# Patient Record
Sex: Female | Born: 1960
Health system: Southern US, Community
[De-identification: ages and names within clinical notes are randomized; demographics above are authoritative.]

## PROBLEM LIST (undated history)

## (undated) DIAGNOSIS — I2510 Atherosclerotic heart disease of native coronary artery without angina pectoris: Secondary | ICD-10-CM

## (undated) DIAGNOSIS — M549 Dorsalgia, unspecified: Secondary | ICD-10-CM

## (undated) DIAGNOSIS — I2582 Chronic total occlusion of coronary artery: Secondary | ICD-10-CM

## (undated) DIAGNOSIS — E785 Hyperlipidemia, unspecified: Secondary | ICD-10-CM

## (undated) DIAGNOSIS — F329 Major depressive disorder, single episode, unspecified: Secondary | ICD-10-CM

## (undated) DIAGNOSIS — I10 Essential (primary) hypertension: Secondary | ICD-10-CM

## (undated) DIAGNOSIS — I219 Acute myocardial infarction, unspecified: Secondary | ICD-10-CM

## (undated) DIAGNOSIS — Z8719 Personal history of other diseases of the digestive system: Secondary | ICD-10-CM

## (undated) DIAGNOSIS — M797 Fibromyalgia: Secondary | ICD-10-CM

## (undated) DIAGNOSIS — F32A Depression, unspecified: Secondary | ICD-10-CM

## (undated) DIAGNOSIS — M1990 Unspecified osteoarthritis, unspecified site: Secondary | ICD-10-CM

## (undated) DIAGNOSIS — E119 Type 2 diabetes mellitus without complications: Secondary | ICD-10-CM

## (undated) DIAGNOSIS — N200 Calculus of kidney: Secondary | ICD-10-CM

## (undated) DIAGNOSIS — G8929 Other chronic pain: Secondary | ICD-10-CM

## (undated) DIAGNOSIS — E039 Hypothyroidism, unspecified: Secondary | ICD-10-CM

## (undated) DIAGNOSIS — Z8711 Personal history of peptic ulcer disease: Secondary | ICD-10-CM

## (undated) DIAGNOSIS — Z9861 Coronary angioplasty status: Secondary | ICD-10-CM

## (undated) DIAGNOSIS — Z87891 Personal history of nicotine dependence: Secondary | ICD-10-CM

## (undated) DIAGNOSIS — G4733 Obstructive sleep apnea (adult) (pediatric): Secondary | ICD-10-CM

## (undated) DIAGNOSIS — K625 Hemorrhage of anus and rectum: Secondary | ICD-10-CM

## (undated) DIAGNOSIS — Z1211 Encounter for screening for malignant neoplasm of colon: Secondary | ICD-10-CM

## (undated) HISTORY — PX: TONSILLECTOMY: SUR1361

## (undated) HISTORY — PX: TOTAL ABDOMINAL HYSTERECTOMY: SHX209

## (undated) HISTORY — PX: INNER EAR SURGERY: SHX679

## (undated) HISTORY — PX: TRANSTHORACIC ECHOCARDIOGRAM: SHX275

---

## 1998-07-01 HISTORY — PX: TUBAL LIGATION: SHX77

## 2013-08-12 DIAGNOSIS — R0789 Other chest pain: Secondary | ICD-10-CM | POA: Diagnosis not present

## 2013-08-12 DIAGNOSIS — I10 Essential (primary) hypertension: Secondary | ICD-10-CM | POA: Diagnosis not present

## 2013-08-12 DIAGNOSIS — F17200 Nicotine dependence, unspecified, uncomplicated: Secondary | ICD-10-CM | POA: Diagnosis not present

## 2013-08-12 DIAGNOSIS — E039 Hypothyroidism, unspecified: Secondary | ICD-10-CM | POA: Diagnosis not present

## 2013-08-12 DIAGNOSIS — R0602 Shortness of breath: Secondary | ICD-10-CM | POA: Diagnosis not present

## 2013-08-12 DIAGNOSIS — R071 Chest pain on breathing: Secondary | ICD-10-CM | POA: Diagnosis not present

## 2013-08-19 DIAGNOSIS — F3289 Other specified depressive episodes: Secondary | ICD-10-CM | POA: Diagnosis not present

## 2013-08-19 DIAGNOSIS — B029 Zoster without complications: Secondary | ICD-10-CM | POA: Diagnosis not present

## 2013-08-19 DIAGNOSIS — E876 Hypokalemia: Secondary | ICD-10-CM | POA: Diagnosis not present

## 2013-08-19 DIAGNOSIS — F329 Major depressive disorder, single episode, unspecified: Secondary | ICD-10-CM | POA: Diagnosis not present

## 2013-08-19 DIAGNOSIS — Z8739 Personal history of other diseases of the musculoskeletal system and connective tissue: Secondary | ICD-10-CM | POA: Diagnosis not present

## 2013-08-19 DIAGNOSIS — I10 Essential (primary) hypertension: Secondary | ICD-10-CM | POA: Diagnosis not present

## 2013-08-19 DIAGNOSIS — E039 Hypothyroidism, unspecified: Secondary | ICD-10-CM | POA: Diagnosis not present

## 2013-10-13 DIAGNOSIS — D239 Other benign neoplasm of skin, unspecified: Secondary | ICD-10-CM | POA: Diagnosis not present

## 2013-10-13 DIAGNOSIS — L280 Lichen simplex chronicus: Secondary | ICD-10-CM | POA: Diagnosis not present

## 2013-10-13 DIAGNOSIS — L821 Other seborrheic keratosis: Secondary | ICD-10-CM | POA: Diagnosis not present

## 2013-10-13 DIAGNOSIS — D235 Other benign neoplasm of skin of trunk: Secondary | ICD-10-CM | POA: Diagnosis not present

## 2013-10-13 DIAGNOSIS — D1801 Hemangioma of skin and subcutaneous tissue: Secondary | ICD-10-CM | POA: Diagnosis not present

## 2013-10-13 DIAGNOSIS — L820 Inflamed seborrheic keratosis: Secondary | ICD-10-CM | POA: Diagnosis not present

## 2013-10-13 DIAGNOSIS — D485 Neoplasm of uncertain behavior of skin: Secondary | ICD-10-CM | POA: Diagnosis not present

## 2013-12-01 DIAGNOSIS — F17200 Nicotine dependence, unspecified, uncomplicated: Secondary | ICD-10-CM | POA: Diagnosis not present

## 2013-12-01 DIAGNOSIS — I10 Essential (primary) hypertension: Secondary | ICD-10-CM | POA: Diagnosis not present

## 2013-12-01 DIAGNOSIS — E039 Hypothyroidism, unspecified: Secondary | ICD-10-CM | POA: Diagnosis not present

## 2013-12-01 DIAGNOSIS — M7989 Other specified soft tissue disorders: Secondary | ICD-10-CM | POA: Diagnosis not present

## 2013-12-01 DIAGNOSIS — E785 Hyperlipidemia, unspecified: Secondary | ICD-10-CM | POA: Diagnosis not present

## 2013-12-07 DIAGNOSIS — F329 Major depressive disorder, single episode, unspecified: Secondary | ICD-10-CM | POA: Diagnosis not present

## 2013-12-07 DIAGNOSIS — F3289 Other specified depressive episodes: Secondary | ICD-10-CM | POA: Diagnosis not present

## 2013-12-07 DIAGNOSIS — E039 Hypothyroidism, unspecified: Secondary | ICD-10-CM | POA: Diagnosis not present

## 2013-12-07 DIAGNOSIS — I10 Essential (primary) hypertension: Secondary | ICD-10-CM | POA: Diagnosis not present

## 2013-12-07 DIAGNOSIS — M79609 Pain in unspecified limb: Secondary | ICD-10-CM | POA: Diagnosis not present

## 2013-12-24 DIAGNOSIS — E119 Type 2 diabetes mellitus without complications: Secondary | ICD-10-CM | POA: Diagnosis not present

## 2013-12-24 DIAGNOSIS — R42 Dizziness and giddiness: Secondary | ICD-10-CM | POA: Diagnosis not present

## 2013-12-24 DIAGNOSIS — I10 Essential (primary) hypertension: Secondary | ICD-10-CM | POA: Diagnosis not present

## 2013-12-24 DIAGNOSIS — R51 Headache: Secondary | ICD-10-CM | POA: Diagnosis not present

## 2013-12-24 DIAGNOSIS — H5500 Unspecified nystagmus: Secondary | ICD-10-CM | POA: Diagnosis not present

## 2014-03-17 DIAGNOSIS — F17200 Nicotine dependence, unspecified, uncomplicated: Secondary | ICD-10-CM | POA: Diagnosis not present

## 2014-03-17 DIAGNOSIS — R079 Chest pain, unspecified: Secondary | ICD-10-CM | POA: Diagnosis not present

## 2014-03-17 DIAGNOSIS — R1013 Epigastric pain: Secondary | ICD-10-CM | POA: Diagnosis not present

## 2014-03-17 DIAGNOSIS — Z87442 Personal history of urinary calculi: Secondary | ICD-10-CM | POA: Diagnosis not present

## 2014-03-17 DIAGNOSIS — R1012 Left upper quadrant pain: Secondary | ICD-10-CM | POA: Diagnosis not present

## 2014-03-17 DIAGNOSIS — I10 Essential (primary) hypertension: Secondary | ICD-10-CM | POA: Diagnosis not present

## 2014-03-17 DIAGNOSIS — R0602 Shortness of breath: Secondary | ICD-10-CM | POA: Diagnosis not present

## 2014-03-21 DIAGNOSIS — I10 Essential (primary) hypertension: Secondary | ICD-10-CM | POA: Diagnosis not present

## 2014-03-21 DIAGNOSIS — M5430 Sciatica, unspecified side: Secondary | ICD-10-CM | POA: Diagnosis not present

## 2014-03-21 DIAGNOSIS — F17200 Nicotine dependence, unspecified, uncomplicated: Secondary | ICD-10-CM | POA: Diagnosis not present

## 2014-03-21 DIAGNOSIS — R109 Unspecified abdominal pain: Secondary | ICD-10-CM | POA: Diagnosis not present

## 2014-03-21 DIAGNOSIS — R7309 Other abnormal glucose: Secondary | ICD-10-CM | POA: Diagnosis not present

## 2014-03-23 DIAGNOSIS — F17200 Nicotine dependence, unspecified, uncomplicated: Secondary | ICD-10-CM | POA: Diagnosis not present

## 2014-03-23 DIAGNOSIS — M5430 Sciatica, unspecified side: Secondary | ICD-10-CM | POA: Diagnosis not present

## 2014-03-23 DIAGNOSIS — F329 Major depressive disorder, single episode, unspecified: Secondary | ICD-10-CM | POA: Diagnosis not present

## 2014-03-23 DIAGNOSIS — Z13220 Encounter for screening for lipoid disorders: Secondary | ICD-10-CM | POA: Diagnosis not present

## 2014-03-23 DIAGNOSIS — R109 Unspecified abdominal pain: Secondary | ICD-10-CM | POA: Diagnosis not present

## 2014-03-23 DIAGNOSIS — F3289 Other specified depressive episodes: Secondary | ICD-10-CM | POA: Diagnosis not present

## 2014-03-23 DIAGNOSIS — E119 Type 2 diabetes mellitus without complications: Secondary | ICD-10-CM | POA: Diagnosis not present

## 2014-03-23 DIAGNOSIS — I10 Essential (primary) hypertension: Secondary | ICD-10-CM | POA: Diagnosis not present

## 2014-03-23 DIAGNOSIS — R1012 Left upper quadrant pain: Secondary | ICD-10-CM | POA: Diagnosis not present

## 2014-03-23 DIAGNOSIS — IMO0001 Reserved for inherently not codable concepts without codable children: Secondary | ICD-10-CM | POA: Diagnosis not present

## 2014-03-23 DIAGNOSIS — E039 Hypothyroidism, unspecified: Secondary | ICD-10-CM | POA: Diagnosis not present

## 2014-03-23 DIAGNOSIS — Z87442 Personal history of urinary calculi: Secondary | ICD-10-CM | POA: Diagnosis not present

## 2014-04-15 DIAGNOSIS — I10 Essential (primary) hypertension: Secondary | ICD-10-CM | POA: Diagnosis not present

## 2014-05-09 DIAGNOSIS — Z79899 Other long term (current) drug therapy: Secondary | ICD-10-CM | POA: Diagnosis not present

## 2014-05-09 DIAGNOSIS — F341 Dysthymic disorder: Secondary | ICD-10-CM | POA: Diagnosis not present

## 2014-05-09 DIAGNOSIS — E1142 Type 2 diabetes mellitus with diabetic polyneuropathy: Secondary | ICD-10-CM | POA: Diagnosis not present

## 2014-05-09 DIAGNOSIS — R5382 Chronic fatigue, unspecified: Secondary | ICD-10-CM | POA: Diagnosis not present

## 2014-05-09 DIAGNOSIS — J441 Chronic obstructive pulmonary disease with (acute) exacerbation: Secondary | ICD-10-CM | POA: Diagnosis not present

## 2014-11-09 DIAGNOSIS — H60502 Unspecified acute noninfective otitis externa, left ear: Secondary | ICD-10-CM | POA: Diagnosis not present

## 2014-11-09 DIAGNOSIS — Z880 Allergy status to penicillin: Secondary | ICD-10-CM | POA: Diagnosis not present

## 2014-11-09 DIAGNOSIS — Z888 Allergy status to other drugs, medicaments and biological substances status: Secondary | ICD-10-CM | POA: Diagnosis not present

## 2014-11-09 DIAGNOSIS — F17200 Nicotine dependence, unspecified, uncomplicated: Secondary | ICD-10-CM | POA: Diagnosis not present

## 2014-11-15 ENCOUNTER — Emergency Department (HOSPITAL_COMMUNITY): Payer: Medicare Other

## 2014-11-15 ENCOUNTER — Inpatient Hospital Stay (HOSPITAL_COMMUNITY)
Admission: EM | Admit: 2014-11-15 | Discharge: 2014-11-18 | DRG: 247 | Disposition: A | Discharge status: Home / Self Care | Payer: Medicare Other | Attending: Internal Medicine | Admitting: Internal Medicine

## 2014-11-15 ENCOUNTER — Encounter (HOSPITAL_COMMUNITY): Payer: Self-pay

## 2014-11-15 ENCOUNTER — Other Ambulatory Visit (HOSPITAL_COMMUNITY): Payer: Self-pay

## 2014-11-15 DIAGNOSIS — I214 Non-ST elevation (NSTEMI) myocardial infarction: Secondary | ICD-10-CM

## 2014-11-15 DIAGNOSIS — M25461 Effusion, right knee: Secondary | ICD-10-CM | POA: Diagnosis present

## 2014-11-15 DIAGNOSIS — I10 Essential (primary) hypertension: Secondary | ICD-10-CM | POA: Diagnosis not present

## 2014-11-15 DIAGNOSIS — E039 Hypothyroidism, unspecified: Secondary | ICD-10-CM | POA: Diagnosis present

## 2014-11-15 DIAGNOSIS — E871 Hypo-osmolality and hyponatremia: Secondary | ICD-10-CM | POA: Diagnosis not present

## 2014-11-15 DIAGNOSIS — E781 Pure hyperglyceridemia: Secondary | ICD-10-CM | POA: Diagnosis present

## 2014-11-15 DIAGNOSIS — F12929 Cannabis use, unspecified with intoxication, unspecified: Secondary | ICD-10-CM | POA: Diagnosis present

## 2014-11-15 DIAGNOSIS — F17210 Nicotine dependence, cigarettes, uncomplicated: Secondary | ICD-10-CM | POA: Diagnosis present

## 2014-11-15 DIAGNOSIS — E1159 Type 2 diabetes mellitus with other circulatory complications: Secondary | ICD-10-CM | POA: Diagnosis not present

## 2014-11-15 DIAGNOSIS — R748 Abnormal levels of other serum enzymes: Secondary | ICD-10-CM | POA: Diagnosis not present

## 2014-11-15 DIAGNOSIS — Z888 Allergy status to other drugs, medicaments and biological substances status: Secondary | ICD-10-CM | POA: Diagnosis not present

## 2014-11-15 DIAGNOSIS — I25119 Atherosclerotic heart disease of native coronary artery with unspecified angina pectoris: Secondary | ICD-10-CM | POA: Diagnosis present

## 2014-11-15 DIAGNOSIS — E860 Dehydration: Secondary | ICD-10-CM | POA: Diagnosis present

## 2014-11-15 DIAGNOSIS — H9202 Otalgia, left ear: Secondary | ICD-10-CM

## 2014-11-15 DIAGNOSIS — R7989 Other specified abnormal findings of blood chemistry: Secondary | ICD-10-CM | POA: Diagnosis not present

## 2014-11-15 DIAGNOSIS — G4730 Sleep apnea, unspecified: Secondary | ICD-10-CM | POA: Diagnosis present

## 2014-11-15 DIAGNOSIS — G43909 Migraine, unspecified, not intractable, without status migrainosus: Secondary | ICD-10-CM | POA: Diagnosis present

## 2014-11-15 DIAGNOSIS — I2582 Chronic total occlusion of coronary artery: Secondary | ICD-10-CM | POA: Diagnosis present

## 2014-11-15 DIAGNOSIS — I2510 Atherosclerotic heart disease of native coronary artery without angina pectoris: Secondary | ICD-10-CM

## 2014-11-15 DIAGNOSIS — Z87891 Personal history of nicotine dependence: Secondary | ICD-10-CM

## 2014-11-15 DIAGNOSIS — I219 Acute myocardial infarction, unspecified: Secondary | ICD-10-CM | POA: Diagnosis not present

## 2014-11-15 DIAGNOSIS — Z720 Tobacco use: Secondary | ICD-10-CM | POA: Diagnosis not present

## 2014-11-15 DIAGNOSIS — R42 Dizziness and giddiness: Secondary | ICD-10-CM | POA: Diagnosis not present

## 2014-11-15 DIAGNOSIS — R001 Bradycardia, unspecified: Secondary | ICD-10-CM | POA: Diagnosis present

## 2014-11-15 DIAGNOSIS — I209 Angina pectoris, unspecified: Secondary | ICD-10-CM | POA: Diagnosis not present

## 2014-11-15 DIAGNOSIS — R079 Chest pain, unspecified: Secondary | ICD-10-CM | POA: Diagnosis not present

## 2014-11-15 DIAGNOSIS — R739 Hyperglycemia, unspecified: Secondary | ICD-10-CM | POA: Diagnosis not present

## 2014-11-15 DIAGNOSIS — R072 Precordial pain: Secondary | ICD-10-CM

## 2014-11-15 DIAGNOSIS — E876 Hypokalemia: Secondary | ICD-10-CM | POA: Diagnosis not present

## 2014-11-15 DIAGNOSIS — R778 Other specified abnormalities of plasma proteins: Secondary | ICD-10-CM

## 2014-11-15 DIAGNOSIS — E119 Type 2 diabetes mellitus without complications: Secondary | ICD-10-CM | POA: Diagnosis not present

## 2014-11-15 DIAGNOSIS — E1165 Type 2 diabetes mellitus with hyperglycemia: Secondary | ICD-10-CM | POA: Diagnosis present

## 2014-11-15 DIAGNOSIS — Z880 Allergy status to penicillin: Secondary | ICD-10-CM | POA: Diagnosis not present

## 2014-11-15 DIAGNOSIS — E1169 Type 2 diabetes mellitus with other specified complication: Secondary | ICD-10-CM | POA: Diagnosis not present

## 2014-11-15 DIAGNOSIS — Z9861 Coronary angioplasty status: Secondary | ICD-10-CM

## 2014-11-15 DIAGNOSIS — W19XXXA Unspecified fall, initial encounter: Secondary | ICD-10-CM | POA: Diagnosis present

## 2014-11-15 DIAGNOSIS — E785 Hyperlipidemia, unspecified: Secondary | ICD-10-CM

## 2014-11-15 DIAGNOSIS — E1151 Type 2 diabetes mellitus with diabetic peripheral angiopathy without gangrene: Secondary | ICD-10-CM | POA: Diagnosis not present

## 2014-11-15 DIAGNOSIS — R0789 Other chest pain: Secondary | ICD-10-CM | POA: Diagnosis not present

## 2014-11-15 DIAGNOSIS — E038 Other specified hypothyroidism: Secondary | ICD-10-CM | POA: Diagnosis not present

## 2014-11-15 HISTORY — DX: Personal history of other diseases of the digestive system: Z87.19

## 2014-11-15 HISTORY — DX: Hypothyroidism, unspecified: E03.9

## 2014-11-15 HISTORY — DX: Depression, unspecified: F32.A

## 2014-11-15 HISTORY — DX: Fibromyalgia: M79.7

## 2014-11-15 HISTORY — DX: Major depressive disorder, single episode, unspecified: F32.9

## 2014-11-15 HISTORY — DX: Unspecified osteoarthritis, unspecified site: M19.90

## 2014-11-15 HISTORY — DX: Coronary angioplasty status: Z98.61

## 2014-11-15 HISTORY — DX: Atherosclerotic heart disease of native coronary artery without angina pectoris: I25.10

## 2014-11-15 HISTORY — DX: Chronic total occlusion of coronary artery: I25.82

## 2014-11-15 HISTORY — DX: Calculus of kidney: N20.0

## 2014-11-15 HISTORY — DX: Essential (primary) hypertension: I10

## 2014-11-15 HISTORY — DX: Dorsalgia, unspecified: M54.9

## 2014-11-15 HISTORY — DX: Other chronic pain: G89.29

## 2014-11-15 HISTORY — DX: Personal history of peptic ulcer disease: Z87.11

## 2014-11-15 HISTORY — DX: Acute myocardial infarction, unspecified: I21.9

## 2014-11-15 LAB — URINALYSIS, ROUTINE W REFLEX MICROSCOPIC
BILIRUBIN URINE: NEGATIVE
Glucose, UA: >1000 mg/dL — AB
Hgb urine dipstick: NEGATIVE
KETONES UR: NEGATIVE mg/dL
Leukocytes, UA: NEGATIVE
Nitrite: NEGATIVE
PH: 5.5 (ref 5.0–8.0)
Protein, ur: NEGATIVE mg/dL
SPECIFIC GRAVITY, URINE: 1.042 — AB (ref 1.005–1.030)
Urobilinogen, UA: 0.2 mg/dL (ref 0.0–1.0)

## 2014-11-15 LAB — COMPREHENSIVE METABOLIC PANEL
ALK PHOS: 87 U/L (ref 38–126)
ALT: 10 U/L — ABNORMAL LOW (ref 14–54)
ANION GAP: 11 (ref 5–15)
AST: 13 U/L — ABNORMAL LOW (ref 15–41)
Albumin: 4.3 g/dL (ref 3.5–5.0)
BILIRUBIN TOTAL: 0.9 mg/dL (ref 0.3–1.2)
BUN: 10 mg/dL (ref 6–20)
CO2: 26 mmol/L (ref 22–32)
Calcium: 9.4 mg/dL (ref 8.9–10.3)
Chloride: 91 mmol/L — ABNORMAL LOW (ref 101–111)
Creatinine, Ser: 0.78 mg/dL (ref 0.44–1.00)
GFR calc non Af Amer: >60 mL/min (ref >60)
Glucose, Bld: 547 mg/dL — ABNORMAL HIGH (ref 65–99)
POTASSIUM: 4.4 mmol/L (ref 3.5–5.1)
Sodium: 128 mmol/L — ABNORMAL LOW (ref 135–145)
Total Protein: 7.2 g/dL (ref 6.5–8.1)

## 2014-11-15 LAB — CBG MONITORING, ED
Glucose-Capillary: 586 mg/dL (ref 65–99)
Glucose-Capillary: 546 mg/dL — ABNORMAL HIGH (ref 65–99)

## 2014-11-15 LAB — I-STAT TROPONIN, ED: Troponin i, poc: 0.82 ng/mL (ref 0.00–0.08)

## 2014-11-15 LAB — CBC
HCT: 38.2 % (ref 36.0–46.0)
Hemoglobin: 13.2 g/dL (ref 12.0–15.0)
MCH: 30.3 pg (ref 26.0–34.0)
MCHC: 34.6 g/dL (ref 30.0–36.0)
MCV: 87.8 fL (ref 78.0–100.0)
Platelets: 259 10*3/uL (ref 150–400)
RBC: 4.35 MIL/uL (ref 3.87–5.11)
RDW: 13.2 % (ref 11.5–15.5)
WBC: 8.7 10*3/uL (ref 4.0–10.5)

## 2014-11-15 LAB — APTT
aPTT: 29 seconds (ref 24–37)
aPTT: 63 seconds — ABNORMAL HIGH (ref 24–37)

## 2014-11-15 LAB — T4, FREE: FREE T4: 0.73 ng/dL (ref 0.61–1.12)

## 2014-11-15 LAB — TROPONIN I
Troponin I: 1.15 ng/mL (ref <0.031)
Troponin I: 1.84 ng/mL (ref <0.031)

## 2014-11-15 LAB — MRSA PCR SCREENING: MRSA by PCR: NEGATIVE

## 2014-11-15 LAB — URINE MICROSCOPIC-ADD ON

## 2014-11-15 LAB — PROTIME-INR
INR: 0.91 (ref 0.00–1.49)
INR: 0.99 (ref 0.00–1.49)
PROTHROMBIN TIME: 12.3 s (ref 11.6–15.2)
Prothrombin Time: 13.2 seconds (ref 11.6–15.2)

## 2014-11-15 LAB — TSH: TSH: 59.363 u[IU]/mL — AB (ref 0.350–4.500)

## 2014-11-15 LAB — GLUCOSE, CAPILLARY: GLUCOSE-CAPILLARY: 390 mg/dL — AB (ref 65–99)

## 2014-11-15 LAB — HEPARIN LEVEL (UNFRACTIONATED)

## 2014-11-15 MED ORDER — ZOLPIDEM TARTRATE 5 MG PO TABS: 5.0000 mg | ORAL_TABLET | Freq: Every evening | ORAL | Status: DC | PRN

## 2014-11-15 MED ORDER — NITROGLYCERIN 0.4 MG SL SUBL
0.4000 mg | SUBLINGUAL_TABLET | SUBLINGUAL | Status: DC | PRN
  Administered 2014-11-15 (×2): 0.4 mg via SUBLINGUAL
  Filled 2014-11-15: qty 1

## 2014-11-15 MED ORDER — LEVOTHYROXINE SODIUM 75 MCG PO TABS
175.0000 ug | ORAL_TABLET | Freq: Every day | ORAL | Status: DC
  Administered 2014-11-16: 175 ug via ORAL
  Filled 2014-11-15 (×2): qty 1

## 2014-11-15 MED ORDER — ASPIRIN EC 81 MG PO TBEC
81.0000 mg | DELAYED_RELEASE_TABLET | Freq: Every day | ORAL | Status: DC
  Administered 2014-11-16 – 2014-11-18 (×3): 81 mg via ORAL
  Filled 2014-11-15 (×3): qty 1

## 2014-11-15 MED ORDER — SODIUM CHLORIDE 0.9 % IV SOLN
INTRAVENOUS | Status: DC
  Administered 2014-11-15 – 2014-11-16 (×2): via INTRAVENOUS

## 2014-11-15 MED ORDER — LISINOPRIL 40 MG PO TABS: 40.0000 mg | ORAL_TABLET | Freq: Every day | ORAL | Status: DC

## 2014-11-15 MED ORDER — ASPIRIN 81 MG PO CHEW
324.0000 mg | CHEWABLE_TABLET | Freq: Once | ORAL | Status: AC
  Administered 2014-11-15: 324 mg via ORAL
  Filled 2014-11-15: qty 4

## 2014-11-15 MED ORDER — HEPARIN (PORCINE) IN NACL 100-0.45 UNIT/ML-% IJ SOLN
900.0000 [IU]/h | INTRAMUSCULAR | Status: DC
  Administered 2014-11-15: 900 [IU]/h via INTRAVENOUS
  Filled 2014-11-15: qty 250

## 2014-11-15 MED ORDER — MORPHINE SULFATE 2 MG/ML IJ SOLN
2.0000 mg | INTRAMUSCULAR | Status: DC | PRN
  Administered 2014-11-15 – 2014-11-17 (×4): 2 mg via INTRAVENOUS
  Filled 2014-11-15 (×4): qty 1

## 2014-11-15 MED ORDER — MORPHINE SULFATE 4 MG/ML IJ SOLN
4.0000 mg | Freq: Once | INTRAMUSCULAR | Status: AC
  Administered 2014-11-15: 4 mg via INTRAVENOUS
  Filled 2014-11-15: qty 1

## 2014-11-15 MED ORDER — ATORVASTATIN CALCIUM 20 MG PO TABS
20.0000 mg | ORAL_TABLET | Freq: Every day | ORAL | Status: DC
Start: 2014-11-15 — End: 2014-11-16
  Administered 2014-11-15: 20 mg via ORAL
  Filled 2014-11-15: qty 1

## 2014-11-15 MED ORDER — ACETAMINOPHEN 325 MG PO TABS
650.0000 mg | ORAL_TABLET | ORAL | Status: DC | PRN
  Administered 2014-11-16 – 2014-11-17 (×3): 650 mg via ORAL
  Filled 2014-11-15 (×3): qty 2

## 2014-11-15 MED ORDER — HEPARIN BOLUS VIA INFUSION
4000.0000 [IU] | Freq: Once | INTRAVENOUS | Status: AC
  Administered 2014-11-15: 4000 [IU] via INTRAVENOUS
  Filled 2014-11-15: qty 4000

## 2014-11-15 MED ORDER — INSULIN ASPART 100 UNIT/ML ~~LOC~~ SOLN
0.0000 [IU] | SUBCUTANEOUS | Status: DC
  Administered 2014-11-15: 15 [IU] via SUBCUTANEOUS
  Administered 2014-11-16 (×4): 8 [IU] via SUBCUTANEOUS

## 2014-11-15 MED ORDER — ONDANSETRON HCL 4 MG/2ML IJ SOLN: 4.0000 mg | Freq: Four times a day (QID) | INTRAMUSCULAR | Status: DC | PRN

## 2014-11-15 NOTE — Progress Notes (Signed)
Pt's trop 1.84 and is c/o  8/10 CP. V.S stable. EKG obtained. Sublingual nitro given.  Md on call made aware. Will cont to monitor pt.

## 2014-11-15 NOTE — Progress Notes (Signed)
Pt arrived from [facility]. Cardiology paged. Will cont to monitor pt.

## 2014-11-15 NOTE — ED Notes (Signed)
[facility] [place] ALLISON RN [phone] MADE AWARE- [place]

## 2014-11-15 NOTE — ED Notes (Signed)
PT DECLINES ANY MORE NTG AT THIS TIME

## 2014-11-15 NOTE — Progress Notes (Signed)
ANTICOAGULATION CONSULT NOTE - Initial Consult  Pharmacy Consult for Heparin Indication: chest pain/ACS  Allergies  Allergen Reactions  . Corticosteroids Anaphylaxis    ALL steroids.     . Penicillins Anaphylaxis    Patient Measurements: Height: 5\' 2"  (157.5 cm) Weight: 207 lb (93.895 kg) IBW/kg (Calculated) : 50.1 Heparin Dosing Weight: 71.5 kg  Vital Signs: Temp: 98.8 F (37.1 C) (05/17 1520) BP: 112/60 mmHg (05/17 1703) Pulse Rate: 76 (05/17 1703)  Labs:  Recent Labs  11/15/14 1539  HGB 13.2  HCT 38.2  PLT 259  CREATININE 0.78    Estimated Creatinine Clearance: 85.8 mL/min (by C-G formula based on Cr of 0.78).   Medical History: Past Medical History  Diagnosis Date  . Diabetes mellitus without complication   . Hypothyroid   . Hypertension   . Fibromyalgia   . Depression   . Anxiety     Medications:  Scheduled:  . aspirin  324 mg Oral Once  . heparin  4,000 Units Intravenous Once  .  morphine injection  4 mg Intravenous Once   Infusions:  . heparin      Assessment:  54 yr female with h/o diabetes presents with dizziness, recent fall and indigestion with pain in chest recently  Troponin = 0.82  Pharmacy consulted to initiate IV heparin dosing for treatment of ACS/STEMI  Patient on no anticoagulation PTA  Goal of Therapy:  Heparin level 0.3-0.7 units/ml Monitor platelets by anticoagulation protocol: Yes   Plan:   Obtain baseline PTT and PT/INR  Give heparin 4000 unit IV bolus x 1 then heparin infusion @ 900 units/hr  Check heparin level 6 hr after heparin started  Follow heparin level & CBC daily  Josph Norfleet, Toribio Harbour, PharmD 11/15/2014,5:15 PM

## 2014-11-15 NOTE — ED Notes (Signed)
MD at bedside. 

## 2014-11-15 NOTE — H&P (Signed)
[facility] History and Physical  Lyann Hagstrom HBZ:[id number] DOB: [birth year] DOA: 11/15/2014  Referring physician: Jola Schmidt, MD PCP: Keane Police, MD   Chief Complaint: Chest Pain  HPI: Jacqueline Munoz is a 54 y.o. female with hyperlipidemia, HTN Diabetes Mellitus Type 2 presents with chest pain. She states that she has been having chest tightness and pain across her chest which she though was heartburn. She states that she took some rolaides and this did not help. She states that there was some associated shortness of breath and went down her arms and felt like a band constricting her chest like a big rubber band around her chest. She did feel clammy and cold also. She states that she has a headache and has a history of migraines. She has had some issues with her ear and has had recurring ear infections. She states that she had no cough or congestion and she has no wheeze. She does smoke about a pack a day for 36 years off and on. She states that she does not drink and does not use any recreational drugs. She states that she is still having a 7/10 pain now. She has refused more NTG SL due to a headache.   Review of Systems:  Constitutional:  No weight loss, night sweats, Fevers HEENT:  +headaches,No sneezing, itching, +ear ache, nasal congestion, post nasal drip,  Cardio-vascular:  +chest pain and tightness, no swelling in lower extremities, palpitations  GI:  No heartburn, indigestion, abdominal pain, nausea, no vomiting +constipation Resp:  +shortness of breath with exertion no productive cough, No coughing up of blood  Skin:  no rash or lesions GU:  no dysuria, change in color of urine, no urgency or frequency Musculoskeletal:  No joint pain or swelling. Yesterday fell and has right knee pain Psych:  No change in mood or affect. No depression or anxiety   Past Medical History  Diagnosis Date  . Diabetes mellitus without complication   . Hypothyroid   .  Hypertension   . Fibromyalgia   . Depression   . Anxiety    Past Surgical History  Procedure Laterality Date  . Abdominal hysterectomy    . Cesarean section    . Inner ear surgery    . Tonsillectomy     Social History:  reports that she has been smoking.  She does not have any smokeless tobacco history on file. She reports that she does not drink alcohol or use illicit drugs.  Allergies  Allergen Reactions  . Corticosteroids Anaphylaxis    ALL steroids.     . Penicillins Anaphylaxis    History reviewed. No pertinent family history.   Prior to Admission medications   Medication Sig Start Date End Date Taking? Authorizing Provider  ibuprofen (ADVIL,MOTRIN) 200 MG tablet Take 400 mg by mouth every 6 (six) hours as needed for mild pain or moderate pain.   Yes Historical Provider, MD  levothyroxine (SYNTHROID, LEVOTHROID) 175 MCG tablet Take 175 mcg by mouth daily before breakfast.   Yes Historical Provider, MD  lisinopril (PRINIVIL,ZESTRIL) 40 MG tablet Take 40 mg by mouth daily.   Yes Historical Provider, MD  metFORMIN (GLUCOPHAGE) 500 MG tablet Take 500 mg by mouth 2 (two) times daily with a meal.   Yes Historical Provider, MD   Physical Exam: Filed Vitals:   11/15/14 1520 11/15/14 1629 11/15/14 1703 11/15/14 1726  BP: 121/68  112/60 103/68  Pulse: 84  76 72  Temp: 98.8 F (37.1 C)     Resp:  16  18 16   Height:  5\' 2"  (1.575 m)    Weight:  93.895 kg (207 lb)    SpO2: 94%  97% 96%    Wt Readings from Last 3 Encounters:  11/15/14 93.895 kg (207 lb)    General:  Appears calm and comfortable Eyes: PERRL, normal lids, irises & conjunctiva ENT: grossly normal hearing, lips & tongue Neck: no LAD, masses or thyromegaly Cardiovascular: RRR, no m/r/g. No LE edema. Telemetry: SR, no arrhythmias  Respiratory: CTA bilaterally, no w/r/r. Normal respiratory effort. Abdomen: soft, ntnd Skin: no rash or induration seen on limited exam Musculoskeletal: Right Knee pain Psychiatric:  grossly normal mood and affect, speech fluent and appropriate Neurologic: grossly non-focal.          Labs on Admission:  Basic Metabolic Panel:  Recent Labs Lab 11/15/14 1539  NA 128*  K 4.4  CL 91*  CO2 26  GLUCOSE 547*  BUN 10  CREATININE 0.78  CALCIUM 9.4   Liver Function Tests:  Recent Labs Lab 11/15/14 1539  AST 13*  ALT 10*  ALKPHOS 87  BILITOT 0.9  PROT 7.2  ALBUMIN 4.3   No results for input(s): LIPASE, AMYLASE in the last 168 hours. No results for input(s): AMMONIA in the last 168 hours. CBC:  Recent Labs Lab 11/15/14 1539  WBC 8.7  HGB 13.2  HCT 38.2  MCV 87.8  PLT 259   Cardiac Enzymes: No results for input(s): CKTOTAL, CKMB, CKMBINDEX, TROPONINI in the last 168 hours.  BNP (last 3 results) No results for input(s): BNP in the last 8760 hours.  ProBNP (last 3 results) No results for input(s): PROBNP in the last 8760 hours.  CBG:  Recent Labs Lab 11/15/14 1616  GLUCAP 546*    Radiological Exams on Admission: Dg Chest 2 View  11/15/2014   CLINICAL DATA:  54 year old female with increased urination and dizziness. Hyperglycemia. Recent central chest pain. Initial encounter.  EXAM: CHEST  2 VIEW  COMPARISON:  None.  No acute osseous abnormality identified.  FINDINGS: Lung volumes are within normal limits, mild eventration of the right hemidiaphragm. Normal cardiac size and mediastinal contours. Visualized tracheal air column is within normal limits. Small calcified granuloma superior to the right hilum. Otherwise the lungs are clear. No pneumothorax or pleural effusion. No acute osseous abnormality identified.  IMPRESSION: No acute cardiopulmonary abnormality.   Electronically Signed   By: Genevie Ann M.D.   On: 11/15/2014 16:05   Dg Knee Complete 4 Views Right  11/15/2014   CLINICAL DATA:  Hyperglycemia.  Knee pain.  EXAM: RIGHT KNEE - COMPLETE 4+ VIEW  COMPARISON:  None.  FINDINGS: There is no evidence of fracture or dislocation. There is a  moderate joint effusion. There is no evidence of arthropathy or other focal bone abnormality. Soft tissues are unremarkable.  IMPRESSION: No acute osseous injury of the right knee. Moderate nonspecific joint effusion.   Electronically Signed   By: Kathreen Devoid   On: 11/15/2014 16:05      Assessment/Plan Principal Problem:   Chest pain Active Problems:   Diabetes mellitus without complication   Hyperlipidemia associated with type 2 diabetes mellitus   HTN (hypertension)   Hypothyroid   1. Chest Pain -will transfer to [facility] now -case discussed with Cardiology they will see in consult -will check serial enzymes -started on heparin protocol -may need cath today -hold Beta blockers for now per cardiology  2. Diabetes Mellitus Type 2 without complication -will hold metformin if she  needs cath -she will be started on SSI and monitor FSBS -check A1C  3. Hyperlipidemia with DM Type 2 -will start on statins -check lipid profile -states that she has not been taking her medications for the cholesterol due to cost  4. HTN -will monitor pressures -continue with antihypertensives  5. Hypothyroid -will check TSH -continue with synthroid  6. Knee Pain -had a fall yesterday with some swelling of the right knee -xrays reviewed will need to monitor effusion as she is being started on Heparin   Code Status: Full Code (must indicate code status--if unknown or must be presumed, indicate so) DVT Prophylaxis:heparin Family Communication: None (indicate person spoken with, if applicable, with phone number if by telephone) Disposition Plan: Home (indicate anticipated LOS)  Time spent: 83min  Addley Ballinger A [facility] Pager [phone]

## 2014-11-15 NOTE — ED Notes (Addendum)
Pt takes oral diabetic meds x 8 months-1 year.  Pt has noticed increased urination recently with dizziness.  Pt has had ear infections in the past and thought dizziness was related to ear infection.  Pt recently has had fall and c/o knee pain.  Pt states she also has had indigestion with pain in center chest recently.

## 2014-11-15 NOTE — ED Provider Notes (Addendum)
CSN: [id number]     Arrival date & time 11/15/14  1501 History   First MD Initiated Contact with Patient 11/15/14 1636   Chief Complaint  Patient presents with  . Hyperglycemia  . Chest Pain     Time seen 1636pm  HPI Pt with intermittent CP over the past 4 days described as a tightness across her chest radiating around both sides to her back like belt. Symptoms last 20-30 minutes with associated sob and nausea. Has not attributed certain activities or movements to her symptoms. Pt is without family hx of heart disease. Pt has DM, HTN, HLD and positive tobacco abuse. Still with CP at this time. Pain today has been present constantly throughout the day. Not as severe now as earlier in the day but still present and uncomfortable. Also reports 5-6 days of dizziness and left ear discomfort. Pt reports long standing hx of ear problems and vertigo.    Past Medical History  Diagnosis Date  . Diabetes mellitus without complication   . Hypothyroid   . Hypertension   . Fibromyalgia   . Depression   . Anxiety    Past Surgical History  Procedure Laterality Date  . Abdominal hysterectomy    . Cesarean section    . Inner ear surgery    . Tonsillectomy     History reviewed. No pertinent family history. History  Substance Use Topics  . Smoking status: Current Every Day Smoker  . Smokeless tobacco: Not on file  . Alcohol Use: No   OB History    No data available     Review of Systems  All other systems reviewed and are negative.     Allergies  Corticosteroids and Penicillins  Home Medications   Prior to Admission medications   Medication Sig Start Date End Date Taking? Authorizing Provider  ibuprofen (ADVIL,MOTRIN) 200 MG tablet Take 400 mg by mouth every 6 (six) hours as needed for mild pain or moderate pain.   Yes Historical Provider, MD  levothyroxine (SYNTHROID, LEVOTHROID) 175 MCG tablet Take 175 mcg by mouth daily before breakfast.   Yes Historical Provider, MD   lisinopril (PRINIVIL,ZESTRIL) 40 MG tablet Take 40 mg by mouth daily.   Yes Historical Provider, MD  metFORMIN (GLUCOPHAGE) 500 MG tablet Take 500 mg by mouth 2 (two) times daily with a meal.   Yes Historical Provider, MD   BP 121/68 mmHg  Pulse 84  Temp(Src) 98.8 F (37.1 C)  Resp 16  Ht 5\' 2"  (1.575 m)  Wt 207 lb (93.895 kg)  BMI 37.85 kg/m2  SpO2 94% Physical Exam  Constitutional: She is oriented to person, place, and time. She appears well-developed and well-nourished. No distress.  HENT:  Head: Normocephalic and atraumatic.  Cerumen in left ear, right TM is normal. Unable to visualize left TM.   Eyes: EOM are normal.  Neck: Normal range of motion.  Cardiovascular: Normal rate, regular rhythm and normal heart sounds.   Pulmonary/Chest: Effort normal and breath sounds normal.  Abdominal: Soft. She exhibits no distension. There is no tenderness.  Musculoskeletal: Normal range of motion.  Neurological: She is alert and oriented to person, place, and time.  Skin: Skin is warm and dry.  Psychiatric: She has a normal mood and affect. Judgment normal.  Nursing note and vitals reviewed.   ED Course  Procedures (including critical care time)  CRITICAL CARE Performed by: Hoy Morn Total critical care time: 32 Critical care time was exclusive of separately billable procedures and treating  other patients. Critical care was necessary to treat or prevent imminent or life-threatening deterioration. Critical care was time spent personally by me on the following activities: development of treatment plan with patient and/or surrogate as well as nursing, discussions with consultants, evaluation of patient's response to treatment, examination of patient, obtaining history from patient or surrogate, ordering and performing treatments and interventions, ordering and review of laboratory studies, ordering and review of radiographic studies, pulse oximetry and re-evaluation of patient's  condition.   Labs Review Labs Reviewed  COMPREHENSIVE METABOLIC PANEL - Abnormal; Notable for the following:    Sodium 128 (*)    Chloride 91 (*)    Glucose, Bld 547 (*)    AST 13 (*)    ALT 10 (*)    All other components within normal limits  CBG MONITORING, ED - Abnormal; Notable for the following:    Glucose-Capillary 546 (*)    All other components within normal limits  I-STAT TROPOININ, ED - Abnormal; Notable for the following:    Troponin i, poc 0.82 (*)    All other components within normal limits  CBC  URINALYSIS, ROUTINE W REFLEX MICROSCOPIC  TROPONIN I    Imaging Review Dg Chest 2 View  11/15/2014   CLINICAL DATA:  54 year old female with increased urination and dizziness. Hyperglycemia. Recent central chest pain. Initial encounter.  EXAM: CHEST  2 VIEW  COMPARISON:  None.  No acute osseous abnormality identified.  FINDINGS: Lung volumes are within normal limits, mild eventration of the right hemidiaphragm. Normal cardiac size and mediastinal contours. Visualized tracheal air column is within normal limits. Small calcified granuloma superior to the right hilum. Otherwise the lungs are clear. No pneumothorax or pleural effusion. No acute osseous abnormality identified.  IMPRESSION: No acute cardiopulmonary abnormality.   Electronically Signed   By: Genevie Ann M.D.   On: 11/15/2014 16:05   Dg Knee Complete 4 Views Right  11/15/2014   CLINICAL DATA:  Hyperglycemia.  Knee pain.  EXAM: RIGHT KNEE - COMPLETE 4+ VIEW  COMPARISON:  None.  FINDINGS: There is no evidence of fracture or dislocation. There is a moderate joint effusion. There is no evidence of arthropathy or other focal bone abnormality. Soft tissues are unremarkable.  IMPRESSION: No acute osseous injury of the right knee. Moderate nonspecific joint effusion.   Electronically Signed   By: Kathreen Devoid   On: 11/15/2014 16:05  I personally reviewed the imaging tests through PACS system I reviewed available ER/hospitalization  records through the EMR    EKG Interpretation #1 Date/Time:  Tuesday Nov 15 2014 15:32:52 EDT Ventricular Rate:  79 PR Interval:  146 QRS Duration: 80 QT Interval:  384 QTC Calculation: 440 R Axis:   51 Text Interpretation:  Normal sinus rhythm Low voltage QRS Cannot rule out  Anterior infarct , age undetermined Abnormal ECG No old tracing to compare  Confirmed by Tyron Manetta  MD, Lennette Bihari ([id number]) on 11/15/2014 4:36:44 PM    ECG interpretation #2  Date: 11/15/2014  Rate:1640  Rhythm: normal sinus rhythm  QRS Axis: normal  Intervals: normal  ST/T Wave abnormalities: normal  Conduction Disutrbances: none  Narrative Interpretation:   Old EKG Reviewed: T wave isolated to V2 is now inverted as compared to earlier, otherwise nonspecific ST changes     MDM   Final diagnoses:  None   CP concerning for ACS given hx and risk factors. ASA now, heparin now, pain control. Will send lab troponin. Still with active discomfort at this time. Will  work on controlling pain. Cardiology consultation requested.   5:24 PM Spoke with Dr Stanford Breed, cardiology who agrees her ecgs are without significant abnormality at this time. He agrees with ASA and heparin in the meantime. Requests admission to hospitalist service at [facility] and they will see her when they arrive. Requests hospitalist given her multitude of additional complaints and poorly controlled diabetes. i think this is reasonable.     Jola Schmidt, MD 11/15/14 [place], MD 11/15/14 1726

## 2014-11-16 ENCOUNTER — Encounter (HOSPITAL_COMMUNITY)
Admission: EM | Disposition: A | Discharge status: Home / Self Care | Payer: Medicare Other | Source: Home / Self Care | Attending: Internal Medicine

## 2014-11-16 DIAGNOSIS — I209 Angina pectoris, unspecified: Secondary | ICD-10-CM

## 2014-11-16 DIAGNOSIS — I2510 Atherosclerotic heart disease of native coronary artery without angina pectoris: Secondary | ICD-10-CM

## 2014-11-16 DIAGNOSIS — I2582 Chronic total occlusion of coronary artery: Secondary | ICD-10-CM

## 2014-11-16 DIAGNOSIS — I214 Non-ST elevation (NSTEMI) myocardial infarction: Secondary | ICD-10-CM | POA: Diagnosis present

## 2014-11-16 DIAGNOSIS — R079 Chest pain, unspecified: Secondary | ICD-10-CM

## 2014-11-16 DIAGNOSIS — R7989 Other specified abnormal findings of blood chemistry: Secondary | ICD-10-CM

## 2014-11-16 HISTORY — DX: Chronic total occlusion of coronary artery: I25.82

## 2014-11-16 HISTORY — PX: CARDIAC CATHETERIZATION: SHX172

## 2014-11-16 LAB — BASIC METABOLIC PANEL
Anion gap: 9 (ref 5–15)
Anion gap: 10 (ref 5–15)
BUN: 10 mg/dL (ref 6–20)
BUN: 8 mg/dL (ref 6–20)
CHLORIDE: 93 mmol/L — AB (ref 101–111)
CHLORIDE: 94 mmol/L — AB (ref 101–111)
CO2: 26 mmol/L (ref 22–32)
CO2: 26 mmol/L (ref 22–32)
CREATININE: 0.64 mg/dL (ref 0.44–1.00)
Calcium: 8.5 mg/dL — ABNORMAL LOW (ref 8.9–10.3)
Calcium: 8.7 mg/dL — ABNORMAL LOW (ref 8.9–10.3)
Creatinine, Ser: 0.6 mg/dL (ref 0.44–1.00)
GFR calc Af Amer: >60 mL/min (ref >60)
GFR calc Af Amer: >60 mL/min (ref >60)
GFR calc non Af Amer: >60 mL/min (ref >60)
GFR calc non Af Amer: >60 mL/min (ref >60)
GLUCOSE: 293 mg/dL — AB (ref 65–99)
GLUCOSE: 240 mg/dL — AB (ref 65–99)
POTASSIUM: 3.2 mmol/L — AB (ref 3.5–5.1)
POTASSIUM: 3.9 mmol/L (ref 3.5–5.1)
SODIUM: 130 mmol/L — AB (ref 135–145)
Sodium: 128 mmol/L — ABNORMAL LOW (ref 135–145)

## 2014-11-16 LAB — LIPID PANEL
CHOL/HDL RATIO: 8.5 ratio
CHOLESTEROL: 281 mg/dL — AB (ref 0–200)
HDL: 33 mg/dL — ABNORMAL LOW (ref >40)
LDL Cholesterol: UNDETERMINED mg/dL (ref 0–99)
Triglycerides: 561 mg/dL — ABNORMAL HIGH (ref <150)
VLDL: UNDETERMINED mg/dL (ref 0–40)

## 2014-11-16 LAB — LIPASE, BLOOD: Lipase: 16 U/L — ABNORMAL LOW (ref 22–51)

## 2014-11-16 LAB — CBC
HCT: 37.4 % (ref 36.0–46.0)
HEMOGLOBIN: 12.7 g/dL (ref 12.0–15.0)
MCH: 29.7 pg (ref 26.0–34.0)
MCHC: 34 g/dL (ref 30.0–36.0)
MCV: 87.4 fL (ref 78.0–100.0)
Platelets: 235 10*3/uL (ref 150–400)
RBC: 4.28 MIL/uL (ref 3.87–5.11)
RDW: 13.3 % (ref 11.5–15.5)
WBC: 7.7 10*3/uL (ref 4.0–10.5)

## 2014-11-16 LAB — TROPONIN I
TROPONIN I: 1.38 ng/mL — AB (ref <0.031)
Troponin I: 1.65 ng/mL (ref <0.031)
Troponin I: 1.12 ng/mL (ref <0.031)

## 2014-11-16 LAB — CREATININE, SERUM
Creatinine, Ser: 0.59 mg/dL (ref 0.44–1.00)
GFR calc Af Amer: >60 mL/min (ref >60)
GFR calc non Af Amer: >60 mL/min (ref >60)

## 2014-11-16 LAB — POCT ACTIVATED CLOTTING TIME: Activated Clotting Time: 792 seconds

## 2014-11-16 LAB — AMYLASE: Amylase: 21 U/L — ABNORMAL LOW (ref 28–100)

## 2014-11-16 LAB — GLUCOSE, CAPILLARY
GLUCOSE-CAPILLARY: 294 mg/dL — AB (ref 65–99)
GLUCOSE-CAPILLARY: 286 mg/dL — AB (ref 65–99)
Glucose-Capillary: 257 mg/dL — ABNORMAL HIGH (ref 65–99)
Glucose-Capillary: 259 mg/dL — ABNORMAL HIGH (ref 65–99)

## 2014-11-16 LAB — HEPARIN LEVEL (UNFRACTIONATED)
HEPARIN UNFRACTIONATED: 0.16 [IU]/mL — AB (ref 0.30–0.70)
Heparin Unfractionated: 0.29 IU/mL — ABNORMAL LOW (ref 0.30–0.70)

## 2014-11-16 SURGERY — LEFT HEART CATH AND CORONARY ANGIOGRAPHY

## 2014-11-16 MED ORDER — GLIPIZIDE 10 MG PO TABS
10.0000 mg | ORAL_TABLET | Freq: Two times a day (BID) | ORAL | Status: DC
  Administered 2014-11-17: 10 mg via ORAL
  Filled 2014-11-16 (×3): qty 1

## 2014-11-16 MED ORDER — HEPARIN SODIUM (PORCINE) 1000 UNIT/ML IJ SOLN
INTRAMUSCULAR | Status: DC | PRN
  Administered 2014-11-16: 4500 [IU] via INTRAVENOUS

## 2014-11-16 MED ORDER — NITROGLYCERIN 0.2 MG/ML ON CALL CATH LAB
INTRAVENOUS | Status: DC | PRN
Start: 2014-11-16 — End: 2014-11-16
  Administered 2014-11-16: 200 ug via INTRACORONARY

## 2014-11-16 MED ORDER — TICAGRELOR 90 MG PO TABS
ORAL_TABLET | ORAL | Status: DC | PRN
  Administered 2014-11-16: 180 mg via ORAL

## 2014-11-16 MED ORDER — SODIUM CHLORIDE 0.9 % IV SOLN
250.0000 mg | INTRAVENOUS | Status: DC | PRN
  Administered 2014-11-16: 1.75 mg/kg/h via INTRAVENOUS

## 2014-11-16 MED ORDER — SODIUM CHLORIDE 0.9 % IV SOLN: INTRAVENOUS | Status: DC

## 2014-11-16 MED ORDER — MIDAZOLAM HCL 2 MG/2ML IJ SOLN
INTRAMUSCULAR | Status: AC
  Filled 2014-11-16: qty 2

## 2014-11-16 MED ORDER — BIVALIRUDIN 250 MG IV SOLR
INTRAVENOUS | Status: AC
  Filled 2014-11-16: qty 250

## 2014-11-16 MED ORDER — TICAGRELOR 90 MG PO TABS
90.0000 mg | ORAL_TABLET | Freq: Two times a day (BID) | ORAL | Status: DC
  Administered 2014-11-17 – 2014-11-18 (×3): 90 mg via ORAL
  Filled 2014-11-16 (×5): qty 1

## 2014-11-16 MED ORDER — INSULIN ASPART 100 UNIT/ML ~~LOC~~ SOLN
0.0000 [IU] | Freq: Three times a day (TID) | SUBCUTANEOUS | Status: DC
  Administered 2014-11-17: 8 [IU] via SUBCUTANEOUS
  Administered 2014-11-17: 5 [IU] via SUBCUTANEOUS
  Administered 2014-11-17: 15 [IU] via SUBCUTANEOUS
  Administered 2014-11-18: 8 [IU] via SUBCUTANEOUS
  Administered 2014-11-18: 5 [IU] via SUBCUTANEOUS

## 2014-11-16 MED ORDER — LIDOCAINE HCL (PF) 1 % IJ SOLN
INTRAMUSCULAR | Status: AC
  Filled 2014-11-16: qty 30

## 2014-11-16 MED ORDER — NITROGLYCERIN 1 MG/10 ML FOR IR/CATH LAB
INTRA_ARTERIAL | Status: AC
  Filled 2014-11-16: qty 10

## 2014-11-16 MED ORDER — ACETAMINOPHEN-CODEINE #3 300-30 MG PO TABS
1.0000 | ORAL_TABLET | ORAL | Status: DC | PRN
  Administered 2014-11-17 (×2): 1 via ORAL
  Administered 2014-11-18: 2 via ORAL
  Filled 2014-11-16: qty 1
  Filled 2014-11-16: qty 2
  Filled 2014-11-16: qty 1

## 2014-11-16 MED ORDER — IBUPROFEN 400 MG PO TABS
400.0000 mg | ORAL_TABLET | Freq: Four times a day (QID) | ORAL | Status: DC
  Administered 2014-11-16: 400 mg via ORAL
  Filled 2014-11-16: qty 1
  Filled 2014-11-16: qty 2

## 2014-11-16 MED ORDER — FENOFIBRATE 160 MG PO TABS
160.0000 mg | ORAL_TABLET | Freq: Every day | ORAL | Status: DC
  Administered 2014-11-16: 160 mg via ORAL
  Filled 2014-11-16: qty 1

## 2014-11-16 MED ORDER — FENTANYL CITRATE (PF) 100 MCG/2ML IJ SOLN
INTRAMUSCULAR | Status: AC
  Filled 2014-11-16: qty 2

## 2014-11-16 MED ORDER — HEPARIN SODIUM (PORCINE) 1000 UNIT/ML IJ SOLN
INTRAMUSCULAR | Status: AC
  Filled 2014-11-16: qty 1

## 2014-11-16 MED ORDER — HEPARIN BOLUS VIA INFUSION
2000.0000 [IU] | Freq: Once | INTRAVENOUS | Status: AC
  Administered 2014-11-16: 2000 [IU] via INTRAVENOUS
  Filled 2014-11-16: qty 2000

## 2014-11-16 MED ORDER — NITROGLYCERIN IN D5W 200-5 MCG/ML-% IV SOLN
0.0000 ug/min | INTRAVENOUS | Status: DC
  Administered 2014-11-16: 5 ug/min via INTRAVENOUS
  Filled 2014-11-16: qty 250

## 2014-11-16 MED ORDER — BIVALIRUDIN BOLUS VIA INFUSION - CUPID
INTRAVENOUS | Status: DC | PRN
  Administered 2014-11-16: 67.35 mg via INTRAVENOUS

## 2014-11-16 MED ORDER — MIDAZOLAM HCL 2 MG/2ML IJ SOLN
INTRAMUSCULAR | Status: DC | PRN
  Administered 2014-11-16 (×5): 1 mg via INTRAVENOUS

## 2014-11-16 MED ORDER — HEPARIN (PORCINE) IN NACL 2-0.9 UNIT/ML-% IJ SOLN
INTRAMUSCULAR | Status: AC
  Filled 2014-11-16: qty 1000

## 2014-11-16 MED ORDER — SODIUM CHLORIDE 0.9 % IJ SOLN
3.0000 mL | Freq: Two times a day (BID) | INTRAMUSCULAR | Status: DC
  Administered 2014-11-17 (×2): 3 mL via INTRAVENOUS

## 2014-11-16 MED ORDER — VERAPAMIL HCL 2.5 MG/ML IV SOLN
INTRAVENOUS | Status: AC
  Filled 2014-11-16: qty 2

## 2014-11-16 MED ORDER — VERAPAMIL HCL 2.5 MG/ML IV SOLN
INTRAVENOUS | Status: DC | PRN
  Administered 2014-11-16: 16:00:00 via INTRA_ARTERIAL

## 2014-11-16 MED ORDER — ASPIRIN 81 MG PO CHEW: 81.0000 mg | CHEWABLE_TABLET | Freq: Every day | ORAL | Status: DC

## 2014-11-16 MED ORDER — SODIUM CHLORIDE 0.9 % IV SOLN: 250.0000 mL | INTRAVENOUS | Status: DC | PRN

## 2014-11-16 MED ORDER — SODIUM CHLORIDE 0.9 % WEIGHT BASED INFUSION: 1.0000 mL/kg/h | INTRAVENOUS | Status: AC

## 2014-11-16 MED ORDER — HEPARIN SODIUM (PORCINE) 5000 UNIT/ML IJ SOLN
5000.0000 [IU] | Freq: Three times a day (TID) | INTRAMUSCULAR | Status: DC
  Administered 2014-11-17 – 2014-11-18 (×3): 5000 [IU] via SUBCUTANEOUS
  Filled 2014-11-16 (×5): qty 1

## 2014-11-16 MED ORDER — POTASSIUM CHLORIDE CRYS ER 20 MEQ PO TBCR
40.0000 meq | EXTENDED_RELEASE_TABLET | Freq: Once | ORAL | Status: AC
  Administered 2014-11-16: 40 meq via ORAL
  Filled 2014-11-16: qty 2

## 2014-11-16 MED ORDER — LEVOTHYROXINE SODIUM 100 MCG PO TABS
200.0000 ug | ORAL_TABLET | Freq: Every day | ORAL | Status: DC
  Administered 2014-11-17 – 2014-11-18 (×2): 200 ug via ORAL
  Filled 2014-11-16: qty 2
  Filled 2014-11-16 (×2): qty 1

## 2014-11-16 MED ORDER — FREESTYLE SYSTEM KIT: 1.0000 | PACK | Freq: Three times a day (TID) | Status: AC

## 2014-11-16 MED ORDER — FENTANYL CITRATE (PF) 100 MCG/2ML IJ SOLN
INTRAMUSCULAR | Status: DC | PRN
  Administered 2014-11-16 (×5): 25 ug via INTRAVENOUS
  Administered 2014-11-16: 50 ug via INTRAVENOUS
  Administered 2014-11-16: 25 ug via INTRAVENOUS
  Administered 2014-11-16: 50 ug via INTRAVENOUS

## 2014-11-16 MED ORDER — SODIUM CHLORIDE 0.9 % IJ SOLN: 3.0000 mL | INTRAMUSCULAR | Status: DC | PRN

## 2014-11-16 MED ORDER — TICAGRELOR 90 MG PO TABS
ORAL_TABLET | ORAL | Status: AC
  Filled 2014-11-16: qty 2

## 2014-11-16 MED ORDER — HEPARIN (PORCINE) IN NACL 100-0.45 UNIT/ML-% IJ SOLN
1250.0000 [IU]/h | INTRAMUSCULAR | Status: DC
  Administered 2014-11-16: 900 [IU]/h via INTRAVENOUS
  Administered 2014-11-16: 1250 [IU]/h via INTRAVENOUS
  Filled 2014-11-16: qty 250

## 2014-11-16 SURGICAL SUPPLY — 30 items
BALLN EMERGE MR 2.0X15 (BALLOONS) ×4
BALLN EMERGE MR 2.5X15 (BALLOONS) ×4
BALLN ~~LOC~~ TREK RX 1.5X12 (BALLOONS) ×4 IMPLANT
BALLN ~~LOC~~ TREK RX 2.25X15 (BALLOONS) ×4 IMPLANT
BALLN ~~LOC~~ TREK RX 3.0X12 (BALLOONS) ×4
BALLOON EMERGE MR 2.0X15 (BALLOONS) ×2 IMPLANT
BALLOON EMERGE MR 2.5X15 (BALLOONS) ×2 IMPLANT
BALLOON ~~LOC~~ TREK RX 3.0X12 (BALLOONS) ×2 IMPLANT
CATH INFINITI 5 FR JL3.5 (CATHETERS) ×4 IMPLANT
CATH INFINITI 5FR ANG PIGTAIL (CATHETERS) ×4 IMPLANT
CATH INFINITI 5FR MULTPACK ANG (CATHETERS) IMPLANT
CATH INFINITI JR4 5F (CATHETERS) ×4 IMPLANT
CATH VISTA GUIDE 6FR XBLAD3.5 (CATHETERS) ×4 IMPLANT
DEVICE RAD COMP TR BAND LRG (VASCULAR PRODUCTS) ×4 IMPLANT
GLIDESHEATH SLEND SS 6F .021 (SHEATH) ×4 IMPLANT
KIT ENCORE 26 ADVANTAGE (KITS) ×8 IMPLANT
KIT HEART LEFT (KITS) ×4 IMPLANT
PACK CARDIAC CATHETERIZATION (CUSTOM PROCEDURE TRAY) ×4 IMPLANT
SHEATH PINNACLE 5F 10CM (SHEATH) IMPLANT
STENT PROMUS PREM MR 2.75X16 (Permanent Stent) ×4 IMPLANT
STENT PROMUS PREM MR 3.0X16 (Permanent Stent) ×4 IMPLANT
STENT SYNERGY DES 2.25X38 (Permanent Stent) ×4 IMPLANT
STENT SYNERGY DES 2.75X24 (Permanent Stent) ×4 IMPLANT
SYR MEDRAD MARK V 150ML (SYRINGE) ×4 IMPLANT
TRANSDUCER W/STOPCOCK (MISCELLANEOUS) ×4 IMPLANT
TUBING CIL FLEX 10 FLL-RA (TUBING) ×4 IMPLANT
WIRE ASAHI MIRACLEBROS-3 180CM (WIRE) ×4 IMPLANT
WIRE ASAHI PROWATER 180CM (WIRE) ×12 IMPLANT
WIRE EMERALD 3MM-J .035X150CM (WIRE) IMPLANT
WIRE SAFE-T 1.5MM-J .035X260CM (WIRE) ×4 IMPLANT

## 2014-11-16 NOTE — Consult Note (Signed)
Referring Physician: Dr. Roel Cluck Primary Physician: Primary Cardiologist: Reason for Consultation: positive troponin   HPI: Jacqueline Munoz is a 54 yo woman with HTN, DM, HLD and tobacco use who presented to [facility] ED with CP.  She states that she has had "heart burn" for the last few weeks that has been quite bothersome.  This has been associated with a "tight band"-feeling around her lower chest.  There have been no triggers or relieving factors.  Episodes last about 20-30 minutes.  She reports recently going to ED in [place], [place] for same symptoms where they did an ECG and blood work, was told she was not having a heart attack and discharged from ED.  Because of ongoing symptoms she presented to [facility] ED and was admitted for rule out.  However, her troponins became positive prompting transfer to [facility] with cardiology consult. She had some discomfort tonight and was given NTG with no immediate  relief but did resolve shortly thereafter.  Currently she is asking for food and complaining of a headache.    Review of Systems:     Cardiac Review of Systems: {Y] = yes [ ]  = no  Chest Pain [x    ]  Resting SOB [   ] Exertional SOB  [x  ]  Orthopnea [  ]   Pedal Edema [   ]    Palpitations [  ] Syncope  [  ]   Presyncope [   ]  General Review of Systems: [Y] = yes [  ]=no Constitional: recent weight change [  ]; anorexia [  ]; fatigue [  ]; nausea [  ]; night sweats [  ]; fever [  ]; or chills [  ];                                                                     Eyes : blurred vision [  ]; diplopia [   ]; vision changes [  ];  Amaurosis fugax[  ]; Resp: cough [x  ];  wheezing[x  ];  hemoptysis[  ];  PND [  ];  GI:  gallstones[  ], vomiting[  ];  dysphagia[  ]; melena[  ];  hematochezia [  ]; heartburn[x  ];   GU: kidney stones [  ]; hematuria[  ];   dysuria [  ];  nocturia[  ]; incontinence [  ];             Skin: rash, swelling[  ];, hair loss[  ];  peripheral edema[  ];  or itching[  ]; Musculosketetal:  myalgias[  ];  joint swelling[  ];  joint erythema[  ];  joint pain[x  ];  back pain[x  ];  Heme/Lymph: bruising[  ];  bleeding[  ];  anemia[  ];  Neuro: TIA[  ];  headaches[x  ];  stroke[  ];  vertigo[  ];  seizures[  ];   paresthesias[  ];  difficulty walking[  ];  Psych:depression[  ]; anxiety[  ];  Endocrine: diabetes[  ];  thyroid dysfunction[  ];  Other:  Past Medical History  Diagnosis Date  . Hypertension   . Fibromyalgia   . Tobacco abuse   . Hyperlipidemia   .  Hypothyroid   . Type II diabetes mellitus   . Family history of adverse reaction to anesthesia     "mother died twice during surgery; they brought her back both times" (11/15/2014)  . Sleep apnea     "dx'd; don't wear a mask" (11/15/2014)  . History of stomach ulcers 1981  . Migraine     "couple/month" (11/15/2014)  . Headache     "@ least weekly" (11/15/2014)  . Arthritis     "hips, ankles, knees, hands, back" (11/15/2014)  . Chronic back pain     "all over"  . Anxiety     "social"  . Chronic depression   . Kidney stones     "I've had several"    Medications Prior to Admission  Medication Sig Dispense Refill  . ibuprofen (ADVIL,MOTRIN) 200 MG tablet Take 400 mg by mouth every 6 (six) hours as needed for mild pain or moderate pain.    [name] levothyroxine (SYNTHROID, LEVOTHROID) 175 MCG tablet Take 175 mcg by mouth daily before breakfast.    . lisinopril (PRINIVIL,ZESTRIL) 40 MG tablet Take 40 mg by mouth daily.    . metFORMIN (GLUCOPHAGE) 500 MG tablet Take 500 mg by mouth 2 (two) times daily with a meal.       . aspirin EC  81 mg Oral Daily  . atorvastatin  20 mg Oral q1800  . insulin aspart  0-15 Units Subcutaneous 6 times per day  . levothyroxine  175 mcg Oral QAC breakfast  . lisinopril  40 mg Oral Daily    Infusions: . sodium chloride 50 mL/hr at 11/15/14 2030  . heparin      Allergies  Allergen Reactions  . Corticosteroids Anaphylaxis    ALL steroids.     . Penicillins Anaphylaxis     History   Social History  . Marital Status: Widowed    Spouse Name: N/A  . Number of Children: N/A  . Years of Education: N/A   Occupational History  . Not on file.   Social History Main Topics  . Smoking status: Current Every Day Smoker -- 1.00 packs/day for 36 years    Types: Cigarettes  . Smokeless tobacco: Never Used  . Alcohol Use: Yes     Comment: 11/15/2014 "might have a few drinks/yr"  . Drug Use: Yes    Special: Marijuana, Cocaine, "Crack" cocaine     Comment: "stopped all drugs in 1987"  . Sexual Activity: Not Currently   Other Topics Concern  . Not on file   Social History Narrative    History reviewed. No pertinent family history.  PHYSICAL EXAM: Filed Vitals:   11/16/14 0000  BP: 99/71  Pulse: 67  Temp: 98.6 F (37 C)  Resp: 20     Intake/Output Summary (Last 24 hours) at 11/16/14 0005 Last data filed at 11/15/14 2245  Gross per 24 hour  Intake      0 ml  Output    125 ml  Net   -125 ml    General:  Well appearing. No respiratory difficulty HEENT: normal Neck: supple. no JVD. Carotids 2+ bilat; no bruits. No lymphadenopathy or thryomegaly appreciated. Cor: PMI nondisplaced. Regular rate & rhythm. No rubs, gallops or murmurs. Lungs: clear Abdomen: soft, nontender, nondistended. No hepatosplenomegaly. No bruits or masses. Good bowel sounds. Extremities: no cyanosis, clubbing, rash, edema Neuro: alert & oriented x 3, cranial nerves grossly intact. moves all 4 extremities w/o difficulty. Affect pleasant.  ECG: SR with poor R wave progression and  subtle (nondiagnostic) ST elevations inferiorly that are no longer present on current ECG.   Results for orders placed or performed during the hospital encounter of 11/15/14 (from the past 24 hour(s))  CBG monitoring, ED     Status: Abnormal   Collection Time: 11/15/14  3:24 PM  Result Value Ref Range   Glucose-Capillary 586 (HH) 65 - 99 mg/dL  CBC     Status: None   Collection Time: 11/15/14   3:39 PM  Result Value Ref Range   WBC 8.7 4.0 - 10.5 K/uL   RBC 4.35 3.87 - 5.11 MIL/uL   Hemoglobin 13.2 12.0 - 15.0 g/dL   HCT 38.2 36.0 - 46.0 %   MCV 87.8 78.0 - 100.0 fL   MCH 30.3 26.0 - 34.0 pg   MCHC 34.6 30.0 - 36.0 g/dL   RDW 13.2 11.5 - 15.5 %   Platelets 259 150 - 400 K/uL  Comprehensive metabolic panel     Status: Abnormal   Collection Time: 11/15/14  3:39 PM  Result Value Ref Range   Sodium 128 (L) 135 - 145 mmol/L   Potassium 4.4 3.5 - 5.1 mmol/L   Chloride 91 (L) 101 - 111 mmol/L   CO2 26 22 - 32 mmol/L   Glucose, Bld 547 (H) 65 - 99 mg/dL   BUN 10 6 - 20 mg/dL   Creatinine, Ser 0.78 0.44 - 1.00 mg/dL   Calcium 9.4 8.9 - 10.3 mg/dL   Total Protein 7.2 6.5 - 8.1 g/dL   Albumin 4.3 3.5 - 5.0 g/dL   AST 13 (L) 15 - 41 U/L   ALT 10 (L) 14 - 54 U/L   Alkaline Phosphatase 87 38 - 126 U/L   Total Bilirubin 0.9 0.3 - 1.2 mg/dL   GFR calc non Af Amer >60 >60 mL/min   GFR calc Af Amer >60 >60 mL/min   Anion gap 11 5 - 15  CBG monitoring, ED     Status: Abnormal   Collection Time: 11/15/14  4:16 PM  Result Value Ref Range   Glucose-Capillary 546 (H) 65 - 99 mg/dL  I-Stat Troponin, ED (not at [facility])     Status: Abnormal   Collection Time: 11/15/14  4:25 PM  Result Value Ref Range   Troponin i, poc 0.82 (HH) 0.00 - 0.08 ng/mL   Comment NOTIFIED PHYSICIAN    Comment 3          Urinalysis, Routine w reflex microscopic     Status: Abnormal   Collection Time: 11/15/14  4:35 PM  Result Value Ref Range   Color, Urine YELLOW YELLOW   APPearance CLOUDY (A) CLEAR   Specific Gravity, Urine 1.042 (H) 1.005 - 1.030   pH 5.5 5.0 - 8.0   Glucose, UA >1000 (A) NEGATIVE mg/dL   Hgb urine dipstick NEGATIVE NEGATIVE   Bilirubin Urine NEGATIVE NEGATIVE   Ketones, ur NEGATIVE NEGATIVE mg/dL   Protein, ur NEGATIVE NEGATIVE mg/dL   Urobilinogen, UA 0.2 0.0 - 1.0 mg/dL   Nitrite NEGATIVE NEGATIVE   Leukocytes, UA NEGATIVE NEGATIVE  Urine microscopic-add on     Status: Abnormal    Collection Time: 11/15/14  4:35 PM  Result Value Ref Range   Squamous Epithelial / LPF MANY (A) RARE   Urine-Other YEAST   Troponin I     Status: Abnormal   Collection Time: 11/15/14  4:52 PM  Result Value Ref Range   Troponin I 1.15 (HH) <0.031 ng/mL  APTT  Status: None   Collection Time: 11/15/14  5:51 PM  Result Value Ref Range   aPTT 29 24 - 37 seconds  Protime-INR     Status: None   Collection Time: 11/15/14  5:51 PM  Result Value Ref Range   Prothrombin Time 12.3 11.6 - 15.2 seconds   INR 0.91 0.00 - 1.49  Heparin level (unfractionated)     Status: Abnormal   Collection Time: 11/15/14  5:51 PM  Result Value Ref Range   Heparin Unfractionated <0.10 (L) 0.30 - 0.70 IU/mL  Glucose, capillary     Status: Abnormal   Collection Time: 11/15/14  8:26 PM  Result Value Ref Range   Glucose-Capillary 390 (H) 65 - 99 mg/dL   Comment 1 Notify RN   MRSA PCR Screening     Status: None   Collection Time: 11/15/14  8:27 PM  Result Value Ref Range   MRSA by PCR NEGATIVE NEGATIVE  TSH     Status: Abnormal   Collection Time: 11/15/14  9:05 PM  Result Value Ref Range   TSH 59.363 (H) 0.350 - 4.500 uIU/mL  T4, free     Status: None   Collection Time: 11/15/14  9:05 PM  Result Value Ref Range   Free T4 0.73 0.61 - 1.12 ng/dL  Troponin I     Status: Abnormal   Collection Time: 11/15/14  9:05 PM  Result Value Ref Range   Troponin I 1.84 (HH) <0.031 ng/mL  Protime-INR     Status: None   Collection Time: 11/15/14  9:05 PM  Result Value Ref Range   Prothrombin Time 13.2 11.6 - 15.2 seconds   INR 0.99 0.00 - 1.49  APTT     Status: Abnormal   Collection Time: 11/15/14  9:05 PM  Result Value Ref Range   aPTT 63 (H) 24 - 37 seconds  Glucose, capillary     Status: Abnormal   Collection Time: 11/15/14 11:55 PM  Result Value Ref Range   Glucose-Capillary 257 (H) 65 - 99 mg/dL   Comment 1 Notify RN    Dg Chest 2 View  11/15/2014   CLINICAL DATA:  54 year old female with increased  urination and dizziness. Hyperglycemia. Recent central chest pain. Initial encounter.  EXAM: CHEST  2 VIEW  COMPARISON:  None.  No acute osseous abnormality identified.  FINDINGS: Lung volumes are within normal limits, mild eventration of the right hemidiaphragm. Normal cardiac size and mediastinal contours. Visualized tracheal air column is within normal limits. Small calcified granuloma superior to the right hilum. Otherwise the lungs are clear. No pneumothorax or pleural effusion. No acute osseous abnormality identified.  IMPRESSION: No acute cardiopulmonary abnormality.   Electronically Signed   By: Genevie Ann M.D.   On: 11/15/2014 16:05   Dg Knee Complete 4 Views Right  11/15/2014   CLINICAL DATA:  Hyperglycemia.  Knee pain.  EXAM: RIGHT KNEE - COMPLETE 4+ VIEW  COMPARISON:  None.  FINDINGS: There is no evidence of fracture or dislocation. There is a moderate joint effusion. There is no evidence of arthropathy or other focal bone abnormality. Soft tissues are unremarkable.  IMPRESSION: No acute osseous injury of the right knee. Moderate nonspecific joint effusion.   Electronically Signed   By: Kathreen Devoid   On: 11/15/2014 16:05     ASSESSMENT: 54 yo woman with DM, HTN, HLD and tobacco abuse who presented to [facility] with CP and transferred to [facility] due to positive troponins c/w NSTEMI.   PLAN/DISCUSSION: Cycle troponins  to peak Continue aspirin and heparin drip Recommend increasing statin to high dose Recommend starting low dose beta blocker Agree with A1c and lipid profile Recommend checking T3 given grossly elevated TSH, may need further diagnostic evaluation Echocardiogram in AM Keep NPO after MN for possible cath tomorrow Needs aggressive risk factor modification including tobacco cessation counseling.

## 2014-11-16 NOTE — Progress Notes (Signed)
[place] for Heparin Indication: chest pain/ACS  Allergies  Allergen Reactions  . Corticosteroids Anaphylaxis    ALL steroids.     . Penicillins Anaphylaxis    Patient Measurements: Height: 5\' 2"  (157.5 cm) Weight: 198 lb (89.812 kg) IBW/kg (Calculated) : 50.1 Heparin Dosing Weight: 71.5 kg  Vital Signs: Temp: 98.3 F (36.8 C) (05/18 0400) Temp Source: Oral (05/18 0400) BP: 114/69 mmHg (05/18 0416) Pulse Rate: 77 (05/18 0400)  Labs:  Recent Labs  11/15/14 1539  11/15/14 1751 11/15/14 2105 11/16/14 0019 11/16/14 0213 11/16/14 0720  HGB 13.2  --   --   --   --   --   --   HCT 38.2  --   --   --   --   --   --   PLT 259  --   --   --   --   --   --   APTT  --   --  29 63*  --   --   --   LABPROT  --   --  12.3 13.2  --   --   --   INR  --   --  0.91 0.99  --   --   --   HEPARINUNFRC  --   --  <0.10*  --  0.16*  --  0.29*  CREATININE 0.78  --   --   --   --   --  0.64  TROPONINI  --   < >  --  1.84*  --  1.65* 1.38*  < > = values in this interval not displayed.  Estimated Creatinine Clearance: 83.8 mL/min (by C-G formula based on Cr of 0.64).  Assessment: 54 yr female with h/o diabetes presents with dizziness, recent fall and indigestion with pain in chest recently (Troponin = 0.82)  PMH: HLD, HTN, DM, Hypothyroid  AC: No AC PTA, pt currently on heparin for NSTEMI. Currently on 1100 units/hr with heparin level of 0.29 @ 0720.   Goal of Therapy:  Heparin level 0.3-0.7 units/ml Monitor platelets by anticoagulation protocol: Yes   Plan:  -Increase heparin to 1250 units/hr -F/U after cath or will check a HL at 6 hours if patient does not go to cath lab as scheduled  Levester Fresh, PharmD, BCPS Clinical Pharmacist 5/18/20169:49 AM

## 2014-11-16 NOTE — Progress Notes (Signed)
[place] for Heparin Indication: chest pain/ACS  Allergies  Allergen Reactions  . Corticosteroids Anaphylaxis    ALL steroids.     . Penicillins Anaphylaxis    Patient Measurements: Height: 5\' 2"  (157.5 cm) Weight: 207 lb (93.895 kg) IBW/kg (Calculated) : 50.1 Heparin Dosing Weight: 71.5 kg  Vital Signs: Temp: 98.6 F (37 C) (05/18 0000) Temp Source: Oral (05/18 0000) BP: 99/71 mmHg (05/18 0000) Pulse Rate: 67 (05/18 0000)  Labs:  Recent Labs  11/15/14 1539 11/15/14 1652 11/15/14 1751 11/15/14 2105 11/16/14 0019  HGB 13.2  --   --   --   --   HCT 38.2  --   --   --   --   PLT 259  --   --   --   --   APTT  --   --  29 63*  --   LABPROT  --   --  12.3 13.2  --   INR  --   --  0.91 0.99  --   HEPARINUNFRC  --   --  <0.10*  --  0.16*  CREATININE 0.78  --   --   --   --   TROPONINI  --  1.15*  --  1.84*  --     Estimated Creatinine Clearance: 85.8 mL/min (by C-G formula based on Cr of 0.78).  Assessment: 54 y.o. female with NSTEMI for heparin  Goal of Therapy:  Heparin level 0.3-0.7 units/ml Monitor platelets by anticoagulation protocol: Yes   Plan:  Heparin 2000 units IV bolus, then increase heparin 1100 units/hr Check heparin level in 6 hours.   Phillis Knack, PharmD, BCPS

## 2014-11-16 NOTE — Progress Notes (Signed)
   11/16/14 1400  Clinical Encounter Type  Visited With Patient and family together  Visit Type Initial;Spiritual support;Social support;Pre-op  Spiritual Encounters  Spiritual Needs Prayer;Emotional  Stress Factors  Patient Stress Factors Health changes   Chaplain was referred to patient via spiritual care consult. When chaplain arrived, patient looked like she had been crying. Patient's mother had also just arrived to be with the patient. Patient explained that she is scheduled to have a cath procedure later today and is really worried about. Patient said she is specifically worried about what the doctors may find during this procedure. Patient is frustrated and said she feels she is to young to have heart problems. Patient asked for chaplain to pray for her upcoming cath. Chaplain prayed with patient and patient's family. Chaplain will continue to provide emotional and spiritual support for patient and patient's family as needed.  Jaekwon Mcclune, Claudius Sis, Chaplain  2:14 PM

## 2014-11-16 NOTE — Interval H&P Note (Signed)
History and Physical Interval Note:  11/16/2014 3:35 PM  Jacqueline Munoz  has presented today for surgery, with the diagnosis of non stemi  The various methods of treatment have been discussed with the patient and family. After consideration of risks, benefits and other options for treatment, the patient has consented to  Procedure(s): Left Heart Cath and Coronary Angiography (N/A) as a surgical intervention .  The patient's history has been reviewed, patient examined, no change in status, stable for surgery.  I have reviewed the patient's chart and labs.  Questions were answered to the patient's satisfaction.     Collier [facility] 11/16/2014 3:35 PM Cath Lab Visit (complete for each Cath Lab visit)  Clinical Evaluation Leading to the Procedure:   ACS: Yes.    Non-ACS:    Anginal Classification: CCS IV  Anti-ischemic medical therapy: No Therapy  Non-Invasive Test Results: No non-invasive testing performed  Prior CABG: No previous CABG

## 2014-11-16 NOTE — Progress Notes (Signed)
eLink Physician-Brief Progress Note Patient Name: Jacqueline Munoz DOB: 10-Mar-1961 MRN: [id number]   Date of Service  11/16/2014  HPI/Events of Note  54 yo female with PMH DM, Hyperlipidemia, HTN, Hypothyroid, tobacco abuse and marijuana abuse. Admitted to ICU with NSTEMI. Taken to cardiac cath lab with finding of stenosis of LAD, CFx, OM and RCA. 4 Stents placed. Current management per Cardiology includes ASA, Brilinta, Novolog SSI, Glipizide and a Heparin IV infusion.   eICU Interventions  Continue management per Cardiology.      Intervention Category Evaluation Type: New Patient Evaluation  Lysle Dingwall 11/16/2014, 6:52 PM

## 2014-11-16 NOTE — H&P (View-Only) (Signed)
Patient Name: Jacqueline Munoz Date of Encounter: 11/16/2014   SUBJECTIVE  Complains of chest tightness on a scale 5/10 that radiates to her back like a band. Denies SOB or palpation. Unresolved headache since took nitro. Current marijuana and tobacco smoker. Denies cocaine or any drug abuse.   CURRENT MEDS . aspirin EC  81 mg Oral Daily  . atorvastatin  20 mg Oral q1800  . insulin aspart  0-15 Units Subcutaneous 6 times per day  . levothyroxine  175 mcg Oral QAC breakfast  . lisinopril  40 mg Oral Daily    OBJECTIVE  Filed Vitals:   11/15/14 2317 11/16/14 0000 11/16/14 0400 11/16/14 0416  BP: 94/49 99/71 85/50  114/69  Pulse:  67 77   Temp:  98.6 F (37 C) 98.3 F (36.8 C)   TempSrc:  Oral Oral   Resp: 20 20 20 17   Height:      Weight:   198 lb (89.812 kg)   SpO2:  97% 97%     Intake/Output Summary (Last 24 hours) at 11/16/14 0949 Last data filed at 11/16/14 0600  Gross per 24 hour  Intake    475 ml  Output    125 ml  Net    350 ml   Filed Weights   11/15/14 1629 11/16/14 0400  Weight: 207 lb (93.895 kg) 198 lb (89.812 kg)    PHYSICAL EXAM  General: Pleasant, NAD. Neuro: Alert and oriented X 3. Moves all extremities spontaneously. Psych: Normal affect. HEENT:  Normal  Neck: Supple without bruits or JVD. Lungs:  Resp regular and unlabored. Diminished breath sound bibasilar.  Heart: RRR no s3, s4, or murmurs. Abdomen: Soft, non-tender, non-distended, BS + x 4.  Extremities: No clubbing, cyanosis or edema. DP/PT/Radials 2+ and equal bilaterally. L knee pain with movement.   Accessory Clinical Findings  CBC  Recent Labs  11/15/14 1539  WBC 8.7  HGB 13.2  HCT 38.2  MCV 87.8  PLT 098   Basic Metabolic Panel  Recent Labs  11/15/14 1539 11/16/14 0720  NA 128* 128*  K 4.4 3.2*  CL 91* 93*  CO2 26 26  GLUCOSE 547* 293*  BUN 10 10  CREATININE 0.78 0.64  CALCIUM 9.4 8.5*   Liver Function Tests  Recent Labs  11/15/14 1539  AST 13*  ALT 10*    ALKPHOS 87  BILITOT 0.9  PROT 7.2  ALBUMIN 4.3   Cardiac Enzymes  Recent Labs  11/15/14 2105 11/16/14 0213 11/16/14 0720  TROPONINI 1.84* 1.65* 1.38*    Recent Labs  11/16/14 0213  CHOL 281*  HDL 33*  LDLCALC UNABLE TO CALCULATE IF TRIGLYCERIDE OVER 400 mg/dL  TRIG 561*  CHOLHDL 8.5   Thyroid Function Tests  Recent Labs  11/15/14 2105  TSH 59.363*    TELE  NSR with transient tachycardia.   Radiology/Studies  Dg Chest 2 View  11/15/2014   CLINICAL DATA:  54 year old female with increased urination and dizziness. Hyperglycemia. Recent central chest pain. Initial encounter.  EXAM: CHEST  2 VIEW  COMPARISON:  None.  No acute osseous abnormality identified.  FINDINGS: Lung volumes are within normal limits, mild eventration of the right hemidiaphragm. Normal cardiac size and mediastinal contours. Visualized tracheal air column is within normal limits. Small calcified granuloma superior to the right hilum. Otherwise the lungs are clear. No pneumothorax or pleural effusion. No acute osseous abnormality identified.  IMPRESSION: No acute cardiopulmonary abnormality.   Electronically Signed   By: Herminio Heads.D.  On: 11/15/2014 16:05   Dg Knee Complete 4 Views Right  11/15/2014   CLINICAL DATA:  Hyperglycemia.  Knee pain.  EXAM: RIGHT KNEE - COMPLETE 4+ VIEW  COMPARISON:  None.  FINDINGS: There is no evidence of fracture or dislocation. There is a moderate joint effusion. There is no evidence of arthropathy or other focal bone abnormality. Soft tissues are unremarkable.  IMPRESSION: No acute osseous injury of the right knee. Moderate nonspecific joint effusion.   Electronically Signed   By: Kathreen Devoid   On: 11/15/2014 16:05    ASSESSMENT AND PLAN Principal Problem:   Chest pain Active Problems:   Diabetes mellitus without complication   Hyperlipidemia associated with type 2 diabetes mellitus   HTN (hypertension)   Hypothyroid   Mrs Cavan is a 54 yo woman with HTN, DM,  HLD,  migraine, fibromyalgia, and tobacco use who presented to [facility] ED with CP and transferred to [facility] due to positive troponins c/w NSTEMI.    1. NSTEMI -trop trend 0.82->1.15->1.84->1.65->1.38. Cath later today. She is NPO. Complaining of chest tightness. No SOB or nausea. Unresolved headache post nitro. Will get stat EKG. PRN morphine for pain. Hold nitro for headache and low BP. MD to decide further management.  -started on heparin protocol -hold Beta blockers due to transient bradycardia. Hold ACE given hypotension.   2. Diabetes Mellitus Type 2  -will hold metformin for cath. - A1C pending, further management per primary  3. Hyperlipidemia  -11/16/2014: Cholesterol, Total 281*; HDL-C 33*; LDL (calc) UNABLE TO CALCULATE IF TRIGLYCERIDE OVER 400 mg/dL; Triglycerides 561*; VLDL UNABLE TO CALCULATE IF TRIGLYCERIDE OVER 400 mg/dL - continue ipitor 20mg . Add tricor. Will add amylase and lipase concerning for pancreatitis.    4. HTN -will monitor pressures -continue with antihypertensives  5. Hypothyroid - High TSH, normal T4.  -continue with synthroid, Further management per primary  6. Right Knee Pain -Had a fall 5/16 with some swelling of the right knee -xrays showed no acute osseous injury of the right knee. Moderate nonspecific joint effusion. Monitor effusion as she is being started on Heparin.   7. Marijuana and tobacco smoker - Smoking cessation advised  8. hypokalemia - K 3.2 this morning. Supplement given.   Signed, Bhagat,Bhavinkumar PA-C Pager [phone]  I saw and examined the patient along with Bhagat,Bhavinkumar PA-C on rounds this morning. We reviewed the H&P.  The patient was transferred to [facility] and arrived late last night. Her presenting symptoms were concerning for acute coronary syndrome and her troponins have increased to be consistent with non-STEMI. She also had poorly controlled diabetes with blood sugars in the 500s upon arrival to the  emergency room currently 290. She has hypertriglyceridemia with triglycerides greater than 400.  In addition to her chest heaviness and tightness she is also had chronic chronic 2 weeks worth of bandlike pressure in the epigastrium across the underside of her ribs. This is a different sensation that were brought her to the hospital however. Current issues little bit nauseated from the nitroglycerin and is also dealing with some vertigo from an ongoing ear infection.  All of her medical problems, I appreciate the hospital service admitting for the patient to manage her thyroid condition with high TSH but normal T4 likely sick euthyroid. She also has the diabetes poorly controlled that we managed by the hospitalist service. I think her main presenting symptom however is the acute coronary syndrome/non-STEMI and we've recommended cardiac catheterization.  I have discussed the procedure in detail  along with the risks, benefits, alternatives and indications the patient in detail.  All questions were answered.    Risks / Complications include, but not limited to: Death, MI, CVA/TIA, VF/VT (with defibrillation), Bradycardia (need for temporary pacer placement), contrast induced nephropathy, bleeding / bruising / hematoma / pseudoaneurysm, vascular or coronary injury (with possible emergent CT or Vascular Surgery), adverse medication reactions, infection.    The patient voices understanding and agree to proceed.   I have signed the consent form and placed it on the chart for patient signature and RN witness.    Her blood pressure is not yet adequate position of beta blocker or ACE inhibitor/ARB. Preferentially would consider an ACE inhibitor based on her diabetes and blood pressure will tolerate in the future.   Leonie Man, M.D., M.S. Interventional Cardiologist   Pager # [phone]  11/16/2014 2:15 PM

## 2014-11-16 NOTE — Care Management Note (Signed)
Case Management Note  Patient Details  Name: Jacqueline Munoz MRN: [id number] Date of Birth: Dec 21, 1960  Subjective/Objective:   Pt admitted for cp. Pt is from [place]. Per pt she has insurance, however, no part D for Rx drug coverage. Pt uses [employer] in [place].                 Action/Plan:  MD once pt is medically stable for d/c, please write generic Rx's from the [facility] $4.00 list. No further needs from CM at this time.    Expected Discharge Date:                  Expected Discharge Plan:  Home/Self Care  In-House Referral:     Discharge planning Services  CM Consult, Medication Assistance  Post Acute Care Choice:    Choice offered to:     DME Arranged:    DME Agency:     HH Arranged:    [place] Agency:     Status of Service:     Medicare Important Message Given:    Date Medicare IM Given:    Medicare IM give by:    Date Additional Medicare IM Given:    Additional Medicare Important Message give by:     If discussed at [place] of Stay Meetings, dates discussed:    Additional Comments:  Bethena Roys, RN 11/16/2014, 3:17 PM

## 2014-11-16 NOTE — Progress Notes (Addendum)
Patient Name: Jacqueline Munoz Date of Encounter: 11/16/2014   SUBJECTIVE  Complains of chest tightness on a scale 5/10 that radiates to her back like a band. Denies SOB or palpation. Unresolved headache since took nitro. Current marijuana and tobacco smoker. Denies cocaine or any drug abuse.   CURRENT MEDS . aspirin EC  81 mg Oral Daily  . atorvastatin  20 mg Oral q1800  . insulin aspart  0-15 Units Subcutaneous 6 times per day  . levothyroxine  175 mcg Oral QAC breakfast  . lisinopril  40 mg Oral Daily    OBJECTIVE  Filed Vitals:   11/15/14 2317 11/16/14 0000 11/16/14 0400 11/16/14 0416  BP: 94/49 99/71 85/50  114/69  Pulse:  67 77   Temp:  98.6 F (37 C) 98.3 F (36.8 C)   TempSrc:  Oral Oral   Resp: 20 20 20 17   Height:      Weight:   198 lb (89.812 kg)   SpO2:  97% 97%     Intake/Output Summary (Last 24 hours) at 11/16/14 0949 Last data filed at 11/16/14 0600  Gross per 24 hour  Intake    475 ml  Output    125 ml  Net    350 ml   Filed Weights   11/15/14 1629 11/16/14 0400  Weight: 207 lb (93.895 kg) 198 lb (89.812 kg)    PHYSICAL EXAM  General: Pleasant, NAD. Neuro: Alert and oriented X 3. Moves all extremities spontaneously. Psych: Normal affect. HEENT:  Normal  Neck: Supple without bruits or JVD. Lungs:  Resp regular and unlabored. Diminished breath sound bibasilar.  Heart: RRR no s3, s4, or murmurs. Abdomen: Soft, non-tender, non-distended, BS + x 4.  Extremities: No clubbing, cyanosis or edema. DP/PT/Radials 2+ and equal bilaterally. L knee pain with movement.   Accessory Clinical Findings  CBC  Recent Labs  11/15/14 1539  WBC 8.7  HGB 13.2  HCT 38.2  MCV 87.8  PLT 496   Basic Metabolic Panel  Recent Labs  11/15/14 1539 11/16/14 0720  NA 128* 128*  K 4.4 3.2*  CL 91* 93*  CO2 26 26  GLUCOSE 547* 293*  BUN 10 10  CREATININE 0.78 0.64  CALCIUM 9.4 8.5*   Liver Function Tests  Recent Labs  11/15/14 1539  AST 13*  ALT 10*    ALKPHOS 87  BILITOT 0.9  PROT 7.2  ALBUMIN 4.3   Cardiac Enzymes  Recent Labs  11/15/14 2105 11/16/14 0213 11/16/14 0720  TROPONINI 1.84* 1.65* 1.38*    Recent Labs  11/16/14 0213  CHOL 281*  HDL 33*  LDLCALC UNABLE TO CALCULATE IF TRIGLYCERIDE OVER 400 mg/dL  TRIG 561*  CHOLHDL 8.5   Thyroid Function Tests  Recent Labs  11/15/14 2105  TSH 59.363*    TELE  NSR with transient tachycardia.   Radiology/Studies  Dg Chest 2 View  11/15/2014   CLINICAL DATA:  54 year old female with increased urination and dizziness. Hyperglycemia. Recent central chest pain. Initial encounter.  EXAM: CHEST  2 VIEW  COMPARISON:  None.  No acute osseous abnormality identified.  FINDINGS: Lung volumes are within normal limits, mild eventration of the right hemidiaphragm. Normal cardiac size and mediastinal contours. Visualized tracheal air column is within normal limits. Small calcified granuloma superior to the right hilum. Otherwise the lungs are clear. No pneumothorax or pleural effusion. No acute osseous abnormality identified.  IMPRESSION: No acute cardiopulmonary abnormality.   Electronically Signed   By: Herminio Heads.D.  On: 11/15/2014 16:05   Dg Knee Complete 4 Views Right  11/15/2014   CLINICAL DATA:  Hyperglycemia.  Knee pain.  EXAM: RIGHT KNEE - COMPLETE 4+ VIEW  COMPARISON:  None.  FINDINGS: There is no evidence of fracture or dislocation. There is a moderate joint effusion. There is no evidence of arthropathy or other focal bone abnormality. Soft tissues are unremarkable.  IMPRESSION: No acute osseous injury of the right knee. Moderate nonspecific joint effusion.   Electronically Signed   By: Kathreen Devoid   On: 11/15/2014 16:05    ASSESSMENT AND PLAN Principal Problem:   Chest pain Active Problems:   Diabetes mellitus without complication   Hyperlipidemia associated with type 2 diabetes mellitus   HTN (hypertension)   Hypothyroid   Jacqueline Munoz is a 54 yo woman with HTN, DM,  HLD,  migraine, fibromyalgia, and tobacco use who presented to [facility] ED with CP and transferred to [facility] due to positive troponins c/w NSTEMI.    1. NSTEMI -trop trend 0.82->1.15->1.84->1.65->1.38. Cath later today. She is NPO. Complaining of chest tightness. No SOB or nausea. Unresolved headache post nitro. Will get stat EKG. PRN morphine for pain. Hold nitro for headache and low BP. MD to decide further management.  -started on heparin protocol -hold Beta blockers due to transient bradycardia. Hold ACE given hypotension.   2. Diabetes Mellitus Type 2  -will hold metformin for cath. - A1C pending, further management per primary  3. Hyperlipidemia  -11/16/2014: Cholesterol, Total 281*; HDL-C 33*; LDL (calc) UNABLE TO CALCULATE IF TRIGLYCERIDE OVER 400 mg/dL; Triglycerides 561*; VLDL UNABLE TO CALCULATE IF TRIGLYCERIDE OVER 400 mg/dL - continue ipitor 20mg . Add tricor. Will add amylase and lipase concerning for pancreatitis.    4. HTN -will monitor pressures -continue with antihypertensives  5. Hypothyroid - High TSH, normal T4.  -continue with synthroid, Further management per primary  6. Right Knee Pain -Had a fall 5/16 with some swelling of the right knee -xrays showed no acute osseous injury of the right knee. Moderate nonspecific joint effusion. Monitor effusion as she is being started on Heparin.   7. Marijuana and tobacco smoker - Smoking cessation advised  8. hypokalemia - K 3.2 this morning. Supplement given.   Signed, Bhagat,Bhavinkumar PA-C Pager [phone]  I saw and examined the patient along with Bhagat,Bhavinkumar PA-C on rounds this morning. We reviewed the H&P.  The patient was transferred to [facility] and arrived late last night. Her presenting symptoms were concerning for acute coronary syndrome and her troponins have increased to be consistent with non-STEMI. She also had poorly controlled diabetes with blood sugars in the 500s upon arrival to the  emergency room currently 290. She has hypertriglyceridemia with triglycerides greater than 400.  In addition to her chest heaviness and tightness she is also had chronic chronic 2 weeks worth of bandlike pressure in the epigastrium across the underside of her ribs. This is a different sensation that were brought her to the hospital however. Current issues little bit nauseated from the nitroglycerin and is also dealing with some vertigo from an ongoing ear infection.  All of her medical problems, I appreciate the hospital service admitting for the patient to manage her thyroid condition with high TSH but normal T4 likely sick euthyroid. She also has the diabetes poorly controlled that we managed by the hospitalist service. I think her main presenting symptom however is the acute coronary syndrome/non-STEMI and we've recommended cardiac catheterization.  I have discussed the procedure in detail  along with the risks, benefits, alternatives and indications the patient in detail.  All questions were answered.    Risks / Complications include, but not limited to: Death, MI, CVA/TIA, VF/VT (with defibrillation), Bradycardia (need for temporary pacer placement), contrast induced nephropathy, bleeding / bruising / hematoma / pseudoaneurysm, vascular or coronary injury (with possible emergent CT or Vascular Surgery), adverse medication reactions, infection.    The patient voices understanding and agree to proceed.   I have signed the consent form and placed it on the chart for patient signature and RN witness.    Her blood pressure is not yet adequate position of beta blocker or ACE inhibitor/ARB. Preferentially would consider an ACE inhibitor based on her diabetes and blood pressure will tolerate in the future.   Leonie Man, M.D., M.S. Interventional Cardiologist   Pager # [phone]  11/16/2014 2:15 PM
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
## 2014-11-16 NOTE — Progress Notes (Signed)
Utilization review completed. Katelynn Heidler, RN, BSN. 

## 2014-11-16 NOTE — Progress Notes (Signed)
[facility] Progress Note   Bailley Guilford ACZ:[id number] DOB: 1960/12/08 DOA: 11/15/2014 PCP: Keane Police, MD  Brief narrative: Jacqueline Munoz is a 54 y.o. female HTN, DM, HLD and tobacco use who presented to [facility] ED with Chest pain.    Subjective: Currently no complaints of chest pain or dyspnea.   Assessment/Plan: Principal Problem:   Chest pain - Troponin noted to be elevated- max Troponin 1.650 - undergoing a cardiac cath today  Active Problems: Hyponatremia -NA 128 on admission- etiology uncertain-possibly dehydrated in relation to high sugars and resultant diuresis -  improved to 130- cont NS    Diabetes mellitus without complication - has been on Metformin 500 BID only- CBG 547 on admission - Start Glipizide for now -she has no medication coverage - awaiting A1c-     Hyperlipidemia associated with type 2 diabetes mellitus Lipid Panel     Component Value Date/Time   CHOL 281* 11/16/2014 0213   TRIG 561* 11/16/2014 0213   HDL 33* 11/16/2014 0213   CHOLHDL 8.5 11/16/2014 0213   VLDL UNABLE TO CALCULATE IF TRIGLYCERIDE OVER 400 mg/dL 11/16/2014 0213   LDLCALC UNABLE TO CALCULATE IF TRIGLYCERIDE OVER 400 mg/dL 11/16/2014 0213   - will need to start Statin - control DM to get Triglycerides under control    HTN (hypertension) - cont Lisinopril and follow BP    Hypothyroid - TSH elevated at 59.363, Free T4 0.73- have increased Synthroid from 175 mcg to 200- repeat TFTs as outpt  Tobacco abuse - advised to stop smoking  Code Status: full code Family Communication:  Disposition Plan: awaiting cardiac cath results DVT prophylaxis: Heparin Consultants: cardiology Procedures:  Antibiotics: Anti-infectives    None      Objective: Filed Weights   11/15/14 1629 11/16/14 0400  Weight: 93.895 kg (207 lb) 89.812 kg (198 lb)    Intake/Output Summary (Last 24 hours) at 11/16/14 1728 Last data filed at 11/16/14 0600  Gross per 24 hour  Intake    475 ml   Output    125 ml  Net    350 ml     Vitals Filed Vitals:   11/16/14 0000 11/16/14 0400 11/16/14 0416 11/16/14 0725  BP: 99/71 85/50 114/69   Pulse: 67 77    Temp: 98.6 F (37 C) 98.3 F (36.8 C)  98.6 F (37 C)  TempSrc: Oral Oral  Oral  Resp: 20 20 17    Height:      Weight:  89.812 kg (198 lb)    SpO2: 97% 97%      Exam:  General:  Pt is alert, not in acute distress  HEENT: No icterus, No thrush  Cardiovascular: regular rate and rhythm, S1/S2 No murmur  Respiratory: clear to auscultation bilaterally   Abdomen: Soft, +Bowel sounds, non tender, non distended, no guarding  MSK: No LE edema, cyanosis or clubbing  Data Reviewed: Basic Metabolic Panel:  Recent Labs Lab 11/15/14 1539 11/16/14 0720 11/16/14 1514  NA 128* 128* 130*  K 4.4 3.2* 3.9  CL 91* 93* 94*  CO2 26 26 26   GLUCOSE 547* 293* 240*  BUN 10 10 8   CREATININE 0.78 0.64 0.60  CALCIUM 9.4 8.5* 8.7*   Liver Function Tests:  Recent Labs Lab 11/15/14 1539  AST 13*  ALT 10*  ALKPHOS 87  BILITOT 0.9  PROT 7.2  ALBUMIN 4.3    Recent Labs Lab 11/16/14 0720  LIPASE 16*  AMYLASE 21*   No results for input(s): AMMONIA in the last 168  hours. CBC:  Recent Labs Lab 11/15/14 1539  WBC 8.7  HGB 13.2  HCT 38.2  MCV 87.8  PLT 259   Cardiac Enzymes:  Recent Labs Lab 11/15/14 1652 11/15/14 2105 11/16/14 0213 11/16/14 0720  TROPONINI 1.15* 1.84* 1.65* 1.38*   BNP (last 3 results) No results for input(s): BNP in the last 8760 hours.  ProBNP (last 3 results) No results for input(s): PROBNP in the last 8760 hours.  CBG:  Recent Labs Lab 11/15/14 2026 11/15/14 2355 11/16/14 0359 11/16/14 0732 11/16/14 1106  GLUCAP 390* 257* 259* 294* 286*    Recent Results (from the past 240 hour(s))  MRSA PCR Screening     Status: None   Collection Time: 11/15/14  8:27 PM  Result Value Ref Range Status   MRSA by PCR NEGATIVE NEGATIVE Final    Comment:        The GeneXpert MRSA Assay  (FDA approved for NASAL specimens only), is one component of a comprehensive MRSA colonization surveillance program. It is not intended to diagnose MRSA infection nor to guide or monitor treatment for MRSA infections.      Studies: Dg Chest 2 View  11/15/2014   CLINICAL DATA:  54 year old female with increased urination and dizziness. Hyperglycemia. Recent central chest pain. Initial encounter.  EXAM: CHEST  2 VIEW  COMPARISON:  None.  No acute osseous abnormality identified.  FINDINGS: Lung volumes are within normal limits, mild eventration of the right hemidiaphragm. Normal cardiac size and mediastinal contours. Visualized tracheal air column is within normal limits. Small calcified granuloma superior to the right hilum. Otherwise the lungs are clear. No pneumothorax or pleural effusion. No acute osseous abnormality identified.  IMPRESSION: No acute cardiopulmonary abnormality.   Electronically Signed   By: Genevie Ann M.D.   On: 11/15/2014 16:05   Dg Knee Complete 4 Views Right  11/15/2014   CLINICAL DATA:  Hyperglycemia.  Knee pain.  EXAM: RIGHT KNEE - COMPLETE 4+ VIEW  COMPARISON:  None.  FINDINGS: There is no evidence of fracture or dislocation. There is a moderate joint effusion. There is no evidence of arthropathy or other focal bone abnormality. Soft tissues are unremarkable.  IMPRESSION: No acute osseous injury of the right knee. Moderate nonspecific joint effusion.   Electronically Signed   By: Kathreen Devoid   On: 11/15/2014 16:05    Scheduled Meds:  Scheduled Meds: . [MAR Hold] aspirin EC  81 mg Oral Daily  . [MAR Hold] atorvastatin  20 mg Oral q1800  . [MAR Hold] fenofibrate  160 mg Oral Daily  . [MAR Hold] ibuprofen  400 mg Oral QID  . [MAR Hold] insulin aspart  0-15 Units Subcutaneous 6 times per day  . [MAR Hold] levothyroxine  200 mcg Oral QAC breakfast   Continuous Infusions: . sodium chloride 50 mL/hr at 11/16/14 0821  . sodium chloride 100 mL/hr at 11/16/14 1115  .  bivalirudin (ANGIOMAX) infusion 5 mg/mL 1.75 mg/kg/hr (11/16/14 1618)  . heparin 1,250 Units/hr (11/16/14 1108)    Time spent on care of this patient: 68 min   [place], MD 11/16/2014, 5:28 PM  LOS: 1 day   [facility] Office  [phone] Pager - Text Page per [email or web address]  If 7PM-7AM, please contact night-coverage [email or web address]

## 2014-11-17 ENCOUNTER — Inpatient Hospital Stay (HOSPITAL_COMMUNITY): Payer: Medicare Other

## 2014-11-17 ENCOUNTER — Encounter (HOSPITAL_COMMUNITY): Payer: Self-pay | Admitting: Cardiology

## 2014-11-17 DIAGNOSIS — E038 Other specified hypothyroidism: Secondary | ICD-10-CM

## 2014-11-17 DIAGNOSIS — R42 Dizziness and giddiness: Secondary | ICD-10-CM

## 2014-11-17 DIAGNOSIS — R079 Chest pain, unspecified: Secondary | ICD-10-CM

## 2014-11-17 DIAGNOSIS — E1151 Type 2 diabetes mellitus with diabetic peripheral angiopathy without gangrene: Secondary | ICD-10-CM

## 2014-11-17 DIAGNOSIS — I214 Non-ST elevation (NSTEMI) myocardial infarction: Principal | ICD-10-CM

## 2014-11-17 DIAGNOSIS — I219 Acute myocardial infarction, unspecified: Secondary | ICD-10-CM | POA: Diagnosis not present

## 2014-11-17 DIAGNOSIS — H9202 Otalgia, left ear: Secondary | ICD-10-CM

## 2014-11-17 DIAGNOSIS — I2582 Chronic total occlusion of coronary artery: Secondary | ICD-10-CM

## 2014-11-17 HISTORY — DX: Acute myocardial infarction, unspecified: I21.9

## 2014-11-17 LAB — CBC
HEMATOCRIT: 39.1 % (ref 36.0–46.0)
HEMOGLOBIN: 13.2 g/dL (ref 12.0–15.0)
MCH: 29.8 pg (ref 26.0–34.0)
MCHC: 33.8 g/dL (ref 30.0–36.0)
MCV: 88.3 fL (ref 78.0–100.0)
Platelets: 232 10*3/uL (ref 150–400)
RBC: 4.43 MIL/uL (ref 3.87–5.11)
RDW: 13.2 % (ref 11.5–15.5)
WBC: 8.6 10*3/uL (ref 4.0–10.5)

## 2014-11-17 LAB — GLUCOSE, CAPILLARY
Glucose-Capillary: 279 mg/dL — ABNORMAL HIGH (ref 65–99)
Glucose-Capillary: 327 mg/dL — ABNORMAL HIGH (ref 65–99)
Glucose-Capillary: 228 mg/dL — ABNORMAL HIGH (ref 65–99)
Glucose-Capillary: 251 mg/dL — ABNORMAL HIGH (ref 65–99)
Glucose-Capillary: 245 mg/dL — ABNORMAL HIGH (ref 65–99)

## 2014-11-17 LAB — BASIC METABOLIC PANEL
ANION GAP: 11 (ref 5–15)
BUN: 6 mg/dL (ref 6–20)
CALCIUM: 8.4 mg/dL — AB (ref 8.9–10.3)
CO2: 21 mmol/L — ABNORMAL LOW (ref 22–32)
CREATININE: 0.57 mg/dL (ref 0.44–1.00)
Chloride: 98 mmol/L — ABNORMAL LOW (ref 101–111)
GLUCOSE: 328 mg/dL — AB (ref 65–99)
Potassium: 4.3 mmol/L (ref 3.5–5.1)
Sodium: 130 mmol/L — ABNORMAL LOW (ref 135–145)

## 2014-11-17 LAB — TROPONIN I: Troponin I: 1.3 ng/mL (ref <0.031)

## 2014-11-17 LAB — HEMOGLOBIN A1C
Hgb A1c MFr Bld: 12.5 % — ABNORMAL HIGH (ref 4.8–5.6)
MEAN PLASMA GLUCOSE: 312 mg/dL

## 2014-11-17 MED ORDER — ATORVASTATIN CALCIUM 80 MG PO TABS
80.0000 mg | ORAL_TABLET | Freq: Every day | ORAL | Status: DC
  Administered 2014-11-17: 80 mg via ORAL
  Filled 2014-11-17: qty 1

## 2014-11-17 MED ORDER — INSULIN DETEMIR 100 UNIT/ML ~~LOC~~ SOLN
20.0000 [IU] | Freq: Every day | SUBCUTANEOUS | Status: DC
  Administered 2014-11-17: 20 [IU] via SUBCUTANEOUS
  Filled 2014-11-17 (×3): qty 0.2

## 2014-11-17 MED ORDER — SODIUM CHLORIDE 0.9 % IV SOLN
INTRAVENOUS | Status: DC
  Administered 2014-11-17 – 2014-11-18 (×2): via INTRAVENOUS

## 2014-11-17 MED ORDER — NICOTINE 21 MG/24HR TD PT24
21.0000 mg | MEDICATED_PATCH | Freq: Every day | TRANSDERMAL | Status: DC
  Administered 2014-11-17 – 2014-11-18 (×2): 21 mg via TRANSDERMAL
  Filled 2014-11-17 (×2): qty 1

## 2014-11-17 MED ORDER — LIVING WELL WITH DIABETES BOOK
Freq: Once | Status: AC
  Administered 2014-11-17: 1
  Filled 2014-11-17: qty 1

## 2014-11-17 MED ORDER — CARVEDILOL 3.125 MG PO TABS
3.1250 mg | ORAL_TABLET | Freq: Two times a day (BID) | ORAL | Status: DC
  Administered 2014-11-17 – 2014-11-18 (×3): 3.125 mg via ORAL
  Filled 2014-11-17 (×4): qty 1

## 2014-11-17 MED FILL — Heparin Sodium (Porcine) 2 Unit/ML in Sodium Chloride 0.9%: INTRAMUSCULAR | Qty: 1000 | Status: AC

## 2014-11-17 MED FILL — Lidocaine HCl Local Preservative Free (PF) Inj 1%: INTRAMUSCULAR | Qty: 30 | Status: AC

## 2014-11-17 NOTE — Progress Notes (Signed)
Cardiology Progress Note  Subjective: No acute events overnight. Jacqueline Munoz was seen and examined this morning.  She denies CP or dyspnea.  Objective: Vital signs in last 24 hours: Filed Vitals:   11/17/14 0400 11/17/14 0505 11/17/14 0600 11/17/14 0700  BP: 123/73 147/79 104/52 130/71  Pulse: 66 96 58 67  Temp:    98.2 F (36.8 C)  TempSrc:    Oral  Resp: 16 20 10 17   Height:      Weight:      SpO2: 98% 96% 98% 98%   Weight change:   Intake/Output Summary (Last 24 hours) at 11/17/14 0723 Last data filed at 11/17/14 0700  Gross per 24 hour  Intake    300 ml  Output   1000 ml  Net   -700 ml   General: resting in bed in NAD HEENT: [place]/AT, no JVD or carotid bruits Cardiac: RRR, no rubs, murmurs or gallops; distal pulses intact Pulm: clear to auscultation bilaterally, no wheezes/rales/rhonchi, moving normal volumes of air Abd: soft, nontender, nondistended, BS present Ext: warm and well perfused, no pedal edema Neuro: alert and oriented X3, responding appropriately, moving extremities spontaneously  Telemetry:  NSR 80s  Lab Results: Basic Metabolic Panel:  Recent Labs Lab 11/16/14 0720 11/16/14 1514 11/16/14 2018  NA 128* 130*  --   K 3.2* 3.9  --   CL 93* 94*  --   CO2 26 26  --   GLUCOSE 293* 240*  --   BUN 10 8  --   CREATININE 0.64 0.60 0.59  CALCIUM 8.5* 8.7*  --    Liver Function Tests:  Recent Labs Lab 11/15/14 1539  AST 13*  ALT 10*  ALKPHOS 87  BILITOT 0.9  PROT 7.2  ALBUMIN 4.3    Recent Labs Lab 11/16/14 0720  LIPASE 16*  AMYLASE 21*   CBC:  Recent Labs Lab 11/15/14 1539 11/16/14 2018  WBC 8.7 7.7  HGB 13.2 12.7  HCT 38.2 37.4  MCV 87.8 87.4  PLT 259 235   Cardiac Enzymes:  Recent Labs Lab 11/16/14 0213 11/16/14 0720 11/16/14 2018  TROPONINI 1.65* 1.38* 1.12*   CBG:  Recent Labs Lab 11/15/14 2026 11/15/14 2355 11/16/14 0359 11/16/14 0732 11/16/14 1106 11/16/14 2204  GLUCAP 390* 257* 259* 294* 286* 279*    Hemoglobin A1C:  Recent Labs Lab 11/15/14 2105  HGBA1C 12.5*   Fasting Lipid Panel:  Recent Labs Lab 11/16/14 0213  CHOL 281*  HDL 33*  LDLCALC UNABLE TO CALCULATE IF TRIGLYCERIDE OVER 400 mg/dL  TRIG 561*  CHOLHDL 8.5   Thyroid Function Tests:  Recent Labs Lab 11/15/14 2105  TSH 59.363*  FREET4 0.73   Coagulation:  Recent Labs Lab 11/15/14 1751 11/15/14 2105  LABPROT 12.3 13.2  INR 0.91 0.99   Urinalysis:  Recent Labs Lab 11/15/14 1635  COLORURINE YELLOW  LABSPEC 1.042*  PHURINE 5.5  GLUCOSEU >1000*  HGBUR NEGATIVE  BILIRUBINUR NEGATIVE  KETONESUR NEGATIVE  PROTEINUR NEGATIVE  UROBILINOGEN 0.2  NITRITE NEGATIVE  LEUKOCYTESUR NEGATIVE   Studies/Results: Dg Chest 2 View  11/15/2014   CLINICAL DATA:  54 year old female with increased urination and dizziness. Hyperglycemia. Recent central chest pain. Initial encounter.  EXAM: CHEST  2 VIEW  COMPARISON:  None.  No acute osseous abnormality identified.  FINDINGS: Lung volumes are within normal limits, mild eventration of the right hemidiaphragm. Normal cardiac size and mediastinal contours. Visualized tracheal air column is within normal limits. Small calcified granuloma superior to the right hilum. Otherwise  the lungs are clear. No pneumothorax or pleural effusion. No acute osseous abnormality identified.  IMPRESSION: No acute cardiopulmonary abnormality.   Electronically Signed   By: Genevie Ann M.D.   On: 11/15/2014 16:05   Dg Knee Complete 4 Views Right  11/15/2014   CLINICAL DATA:  Hyperglycemia.  Knee pain.  EXAM: RIGHT KNEE - COMPLETE 4+ VIEW  COMPARISON:  None.  FINDINGS: There is no evidence of fracture or dislocation. There is a moderate joint effusion. There is no evidence of arthropathy or other focal bone abnormality. Soft tissues are unremarkable.  IMPRESSION: No acute osseous injury of the right knee. Moderate nonspecific joint effusion.   Electronically Signed   By: Kathreen Devoid   On: 11/15/2014  16:05   11/16/14 LHC:  Prox RCA to Mid RCA lesion, 100% stenosed.  Mid LAD to Dist LAD lesion, 90% stenosed. There is a 0% residual stenosis post intervention.  A drug-eluting stent x 2 was placed.  Mid Cx to Dist Cx lesion, 95% stenosed. There is a 0% residual stenosis post intervention.  A drug-eluting stent was placed.  Ost 1st Mrg to 1st Mrg lesion, 90% stenosed. There is a 0% residual stenosis post intervention.  A drug-eluting stent was placed.  1. Severe 3 vessel obstructive CAD 2. Normal LV function 3. Successful stenting of the mid LAD with DES x 2. 4. Successful bifurcation stenting of the first OM and mid to distal LCx with DES. 5. Patient had some residual chest pain post procedure with mild ST elevation in the inferolateral leads. She did have a long period of ischemia during the procedure and I think some of her ischemia was related to compromise of collateral flow to the RCA. At the end of procedure she had TIMI 3 flow in all LCA vessels and well preserved collateral flow to the RCA.   Recommendation: will observe in ICU overnight. Monitor serial cardiac enzymes and Ecg. Aggressive risk factor modification. DAPT therapy indefinitely. Will start with ASA and Brilinta. If cost is an issue could consider transition of Brilinta to plavix (with load) in 6 weeks.   Medications: I have reviewed the patient's current medications. Scheduled Meds: . aspirin EC  81 mg Oral Daily  . glipiZIDE  10 mg Oral BID AC  . heparin  5,000 Units Subcutaneous 3 times per day  . insulin aspart  0-15 Units Subcutaneous TID WC  . levothyroxine  200 mcg Oral QAC breakfast  . sodium chloride  3 mL Intravenous Q12H  . ticagrelor  90 mg Oral BID   Continuous Infusions: . nitroGLYCERIN 5 mcg/min (11/17/14 0500)   PRN Meds:.sodium chloride, acetaminophen, acetaminophen-codeine, morphine injection, nitroGLYCERIN, ondansetron (ZOFRAN) IV, sodium chloride, zolpidem   Assessment/Plan: 54 year  old woman with PMH of HTN, DM, HLD, tobacco use and hypothyroidism who presented to [facility] with CP and transferred to [facility] for further management of NSTEMI.  NSTEMI:  S/p PCI with DES x 2 to mid and distal LAD, DES to LCX, DES to first OM.  Experienced peri-procedure Type 4 MI during cath.  EF by ventriculogram 55-65%.  She has not had chest pain today and is currently off of NTG gtt. - continue DAPT (ASA and Brilinta) indefinitely; can switch to Plavix in 6 weeks if cost is an issue - continue risk factor modification (DM, HTN, HLD control, smoking cessation) - START Coreg 3.125mg  BID - START Lipitor 80mg  daily - plan to start low-dose lisinopril tomorrow (she is on lisinopril 40mg  at home but  not sure BP would tolerate with the addition of BB).  These medications can be titrated as an outpatient. - cardiac rehab - 2D ECHO pending - from a cardiac standpoint she is stable for transfer to telemetry; Cardiology will continue to follow with you and arrange outpatient follow-up (likely at [facility] [place] in [place] since she lives in [place], [place])  Essential hypertension:  Stable.  Monitor closely, especially with addition of BB.  Hyperlipidemia associated with type 2 diabetes mellitus:  Adding high intensity Lipitor as above.   Poorly controlled type 2 diabetes mellitus with circulatory disorder:  hgb A1c is 12.5.  She will need tighter control moving forward.  Per primary team.  Hypothyroid:  Per primary team.  Tobacco use disorder:  Wearing nicotine patch while inpatient.   Continue to encourage smoking cessation.    LOS: 2 days   Francesca Oman, DO IMTS PGY2 on CCU Service 11/17/2014, 7:23 AM

## 2014-11-17 NOTE — Progress Notes (Signed)
[facility] Progress Note   Jacqueline Munoz NKN:[id number] DOB: 1961-03-08 DOA: 11/15/2014 PCP: Keane Police, MD  Brief narrative: Jacqueline Munoz is a 54 y.o. female HTN, DM, HLD and tobacco use who presented to [facility] ED with Chest pain.    Subjective: Patient was evaluated this morning-she had no complaints of chest pain dyspnea nausea vomiting diarrhea or constipation. She had pain in her right knee yesterday where she fell prior to admission. He is not having any significant pain this morning.  Assessment/Plan: Principal Problem:   Chest pain - Troponin noted to be elevated- max Troponin 1.65  - Underwent cardiac cath 5/18- full report mentioned below-noted to have severe 3 vessel obstructive coronary artery disease-stenting of the mid LAD done with 2 DES stents, stenting of bifurcation of first OM and mid to distal left circumflex with DES stent -Was transitioned to cardiac ICU after cath- old deferred to cardiology when to downgrade status -As been started on aspirin and Brillinta- she is to continue Brillinta for 1 month for which she will receive samples-afterwards can be transitioned to Plavix for affordability  Active Problems: Hyponatremia -NA 128 on admission- etiology uncertain-possibly dehydrated in relation to high sugars and resultant diuresis -  improved to 130- cont NS    Diabetes mellitus without complication - has been on Metformin 500 BID only- CBG 547 on admission - A1c found to be 12.5-will start teaching in regards to transitioning to insulin- Start Levemir- cont Novolog SSI -Discussed with patient in detail    Hyperlipidemia associated with type 2 diabetes mellitus Lipid Panel     Component Value Date/Time   CHOL 281* 11/16/2014 0213   TRIG 561* 11/16/2014 0213   HDL 33* 11/16/2014 0213   CHOLHDL 8.5 11/16/2014 0213   VLDL UNABLE TO CALCULATE IF TRIGLYCERIDE OVER 400 mg/dL 11/16/2014 0213   LDLCALC UNABLE TO CALCULATE IF TRIGLYCERIDE OVER 400 mg/dL  11/16/2014 0213   - Has been started on Lipitor - control DM to get Triglycerides under control    HTN (hypertension) - cont Lisinopril - Coreg added as well    Hypothyroid - TSH elevated at 59.363, Free T4 0.73- have increased Synthroid from 175 mcg to 200- repeat TFTs as outpt  Tobacco abuse - advised to stop smoking  Code Status: full code Family Communication:  Disposition Plan: Discharge when okay with cardiology-she will be going to [facility] health and wellness clinic where she will obtain her medications as well DVT prophylaxis: Heparin Consultants: cardiology Procedures:  Antibiotics: Anti-infectives    None      Objective: Filed Weights   11/15/14 1629 11/16/14 0400  Weight: 93.895 kg (207 lb) 89.812 kg (198 lb)    Intake/Output Summary (Last 24 hours) at 11/17/14 1104 Last data filed at 11/17/14 1000  Gross per 24 hour  Intake 667.88 ml  Output   1000 ml  Net -332.12 ml     Vitals Filed Vitals:   11/17/14 0600 11/17/14 0700 11/17/14 0800 11/17/14 0900  BP: 104/52 130/71  158/97  Pulse: 58 67 63 93  Temp:  98.2 F (36.8 C)    TempSrc:  Oral    Resp: 10 17 19 26   Height:      Weight:      SpO2: 98% 98% 98% 100%    Exam:  General:  Pt is alert, not in acute distress  HEENT: No icterus, No thrush  Cardiovascular: regular rate and rhythm, S1/S2 No murmur  Respiratory: clear to auscultation bilaterally   Abdomen: Soft, +Bowel  sounds, non tender, non distended, no guarding  MSK: No LE edema, cyanosis or clubbing  Data Reviewed: Basic Metabolic Panel:  Recent Labs Lab 11/15/14 1539 11/16/14 0720 11/16/14 1514 11/16/14 2018 11/17/14 0658  NA 128* 128* 130*  --  130*  K 4.4 3.2* 3.9  --  4.3  CL 91* 93* 94*  --  98*  CO2 26 26 26   --  21*  GLUCOSE 547* 293* 240*  --  328*  BUN 10 10 8   --  6  CREATININE 0.78 0.64 0.60 0.59 0.57  CALCIUM 9.4 8.5* 8.7*  --  8.4*   Liver Function Tests:  Recent Labs Lab 11/15/14 1539  AST 13*  ALT  10*  ALKPHOS 87  BILITOT 0.9  PROT 7.2  ALBUMIN 4.3    Recent Labs Lab 11/16/14 0720  LIPASE 16*  AMYLASE 21*   No results for input(s): AMMONIA in the last 168 hours. CBC:  Recent Labs Lab 11/15/14 1539 11/16/14 2018 11/17/14 0658  WBC 8.7 7.7 8.6  HGB 13.2 12.7 13.2  HCT 38.2 37.4 39.1  MCV 87.8 87.4 88.3  PLT 259 235 232   Cardiac Enzymes:  Recent Labs Lab 11/15/14 2105 11/16/14 0213 11/16/14 0720 11/16/14 2018 11/17/14 0658  TROPONINI 1.84* 1.65* 1.38* 1.12* 1.30*   BNP (last 3 results) No results for input(s): BNP in the last 8760 hours.  ProBNP (last 3 results) No results for input(s): PROBNP in the last 8760 hours.  CBG:  Recent Labs Lab 11/16/14 0359 11/16/14 0732 11/16/14 1106 11/16/14 2204 11/17/14 0713  GLUCAP 259* 294* 286* 279* 327*    Recent Results (from the past 240 hour(s))  MRSA PCR Screening     Status: None   Collection Time: 11/15/14  8:27 PM  Result Value Ref Range Status   MRSA by PCR NEGATIVE NEGATIVE Final    Comment:        The GeneXpert MRSA Assay (FDA approved for NASAL specimens only), is one component of a comprehensive MRSA colonization surveillance program. It is not intended to diagnose MRSA infection nor to guide or monitor treatment for MRSA infections.      Studies: Dg Chest 2 View  11/15/2014   CLINICAL DATA:  54 year old female with increased urination and dizziness. Hyperglycemia. Recent central chest pain. Initial encounter.  EXAM: CHEST  2 VIEW  COMPARISON:  None.  No acute osseous abnormality identified.  FINDINGS: Lung volumes are within normal limits, mild eventration of the right hemidiaphragm. Normal cardiac size and mediastinal contours. Visualized tracheal air column is within normal limits. Small calcified granuloma superior to the right hilum. Otherwise the lungs are clear. No pneumothorax or pleural effusion. No acute osseous abnormality identified.  IMPRESSION: No acute cardiopulmonary  abnormality.   Electronically Signed   By: Genevie Ann M.D.   On: 11/15/2014 16:05   Dg Knee Complete 4 Views Right  11/15/2014   CLINICAL DATA:  Hyperglycemia.  Knee pain.  EXAM: RIGHT KNEE - COMPLETE 4+ VIEW  COMPARISON:  None.  FINDINGS: There is no evidence of fracture or dislocation. There is a moderate joint effusion. There is no evidence of arthropathy or other focal bone abnormality. Soft tissues are unremarkable.  IMPRESSION: No acute osseous injury of the right knee. Moderate nonspecific joint effusion.   Electronically Signed   By: Kathreen Devoid   On: 11/15/2014 16:05    Scheduled Meds:  Scheduled Meds: . aspirin EC  81 mg Oral Daily  . atorvastatin  80 mg  Oral q1800  . carvedilol  3.125 mg Oral BID WC  . glipiZIDE  10 mg Oral BID AC  . heparin  5,000 Units Subcutaneous 3 times per day  . insulin aspart  0-15 Units Subcutaneous TID WC  . levothyroxine  200 mcg Oral QAC breakfast  . nicotine  21 mg Transdermal Daily  . sodium chloride  3 mL Intravenous Q12H  . ticagrelor  90 mg Oral BID   Continuous Infusions: . nitroGLYCERIN Stopped (11/17/14 0915)    Time spent on care of this patient: 90 min   [place], MD 11/17/2014, 11:04 AM  LOS: 2 days   [facility] Office  [phone] Pager - Text Page per [email or web address]  If 7PM-7AM, please contact night-coverage [email or web address]

## 2014-11-17 NOTE — Progress Notes (Signed)
CARDIAC REHAB PHASE I   PRE:  Rate/Rhythm: 86 SR    BP: sitting 137/78    SaO2: 100 RA  MODE:  Ambulation: 740 ft   POST:  Rate/Rhythm: 104 ST    BP: sitting 106/79     SaO2: 100 RA  Pt moving well. No c/o walking. Sts she has had a band around her chest for 2 years and it is gone now. Feels good. Ed began. Left materials for her to review. Sts she has quit smoking for 7 years previously and feels she can again. Will f/u tomorrow. [place], [place], ACSM 11/17/2014 2:06 PM

## 2014-11-17 NOTE — Care Management Note (Signed)
Case Management Note  Patient Details  Name: Jacqueline Munoz MRN: [id number] Date of Birth: 07/06/1960  Subjective/Objective:    Adm w nstemi                Action/Plan: lives w fam, pcp in [place]   Expected Discharge Date:                  Expected Discharge Plan:  Home/Self Care  In-House Referral:     Discharge planning Services  CM Consult, Medication Assistance  Post Acute Care Choice:    Choice offered to:     DME Arranged:    DME Agency:     HH Arranged:    [place] Agency:     Status of Service:     Medicare Important Message Given:    Date Medicare IM Given:    Medicare IM give by:    Date Additional Medicare IM Given:    Additional Medicare Important Message give by:     If discussed at [place] of Stay Meetings, dates discussed:    Additional Comments: ur review done. Gave pt brilinta 30day free card. Will place pt assist form on shadow chart. Pt does not have medicare d supplement. Gave pt health connect phone number and inform on [place] and wellness clinic until finds local phy.  Lacretia Leigh, RN 11/17/2014, 10:23 AM

## 2014-11-17 NOTE — Progress Notes (Signed)
  Echocardiogram 2D Echocardiogram has been performed.  Jacqueline Munoz 11/17/2014, 4:02 PM

## 2014-11-17 NOTE — Progress Notes (Signed)
Inpatient Diabetes Program Recommendations  AACE/ADA: New Consensus Statement on Inpatient Glycemic Control (2013)  Target Ranges:  Prepandial:   less than 140 mg/dL      Peak postprandial:   less than 180 mg/dL (1-2 hours)      Critically ill patients:  140 - 180 mg/dL   Inpatient Diabetes Program Recommendations Insulin - Basal: consider adding basal insulin Lantus or Levemir  HgbA1C: 12.5 Thank you  Raoul Pitch BSN, RN,CDE Inpatient Diabetes Coordinator [phone] (team pager)

## 2014-11-18 DIAGNOSIS — Z720 Tobacco use: Secondary | ICD-10-CM

## 2014-11-18 DIAGNOSIS — Z9861 Coronary angioplasty status: Secondary | ICD-10-CM

## 2014-11-18 DIAGNOSIS — Z87891 Personal history of nicotine dependence: Secondary | ICD-10-CM

## 2014-11-18 LAB — BASIC METABOLIC PANEL
ANION GAP: 7 (ref 5–15)
BUN: <5 mg/dL — ABNORMAL LOW (ref 6–20)
CALCIUM: 8.4 mg/dL — AB (ref 8.9–10.3)
CO2: 24 mmol/L (ref 22–32)
Chloride: 102 mmol/L (ref 101–111)
Creatinine, Ser: 0.63 mg/dL (ref 0.44–1.00)
GFR calc Af Amer: >60 mL/min (ref >60)
Glucose, Bld: 283 mg/dL — ABNORMAL HIGH (ref 65–99)
Potassium: 3.6 mmol/L (ref 3.5–5.1)
Sodium: 133 mmol/L — ABNORMAL LOW (ref 135–145)

## 2014-11-18 LAB — GLUCOSE, CAPILLARY
GLUCOSE-CAPILLARY: 251 mg/dL — AB (ref 65–99)
GLUCOSE-CAPILLARY: 270 mg/dL — AB (ref 65–99)

## 2014-11-18 LAB — CBC
HCT: 35.2 % — ABNORMAL LOW (ref 36.0–46.0)
Hemoglobin: 12.3 g/dL (ref 12.0–15.0)
MCH: 30.6 pg (ref 26.0–34.0)
MCHC: 34.9 g/dL (ref 30.0–36.0)
MCV: 87.6 fL (ref 78.0–100.0)
PLATELETS: 221 10*3/uL (ref 150–400)
RBC: 4.02 MIL/uL (ref 3.87–5.11)
RDW: 13.2 % (ref 11.5–15.5)
WBC: 7.7 10*3/uL (ref 4.0–10.5)

## 2014-11-18 MED ORDER — METFORMIN HCL 500 MG PO TABS: 500.0000 mg | ORAL_TABLET | Freq: Two times a day (BID) | ORAL | Status: DC

## 2014-11-18 MED ORDER — INSULIN DETEMIR 100 UNIT/ML FLEXPEN: 26.0000 [IU] | PEN_INJECTOR | Freq: Every day | SUBCUTANEOUS | Status: DC

## 2014-11-18 MED ORDER — LIVING WELL WITH DIABETES BOOK
Freq: Once | Status: DC
  Filled 2014-11-18: qty 1

## 2014-11-18 MED ORDER — INSULIN ASPART 100 UNIT/ML FLEXPEN: 2.0000 [IU] | PEN_INJECTOR | Freq: Three times a day (TID) | SUBCUTANEOUS | Status: DC

## 2014-11-18 MED ORDER — INSULIN STARTER KIT- SYRINGES (ENGLISH)
1.0000 | Freq: Once | Status: DC
  Filled 2014-11-18: qty 1

## 2014-11-18 MED ORDER — ATORVASTATIN CALCIUM 80 MG PO TABS: 80.0000 mg | ORAL_TABLET | Freq: Every day | ORAL | Status: DC

## 2014-11-18 MED ORDER — LISINOPRIL 10 MG PO TABS
10.0000 mg | ORAL_TABLET | Freq: Every day | ORAL | Status: DC
  Administered 2014-11-18: 10 mg via ORAL
  Filled 2014-11-18: qty 1

## 2014-11-18 MED ORDER — ACETAMINOPHEN-CODEINE #3 300-30 MG PO TABS: 1.0000 | ORAL_TABLET | ORAL | Status: DC | PRN

## 2014-11-18 MED ORDER — CARVEDILOL 3.125 MG PO TABS: 3.1250 mg | ORAL_TABLET | Freq: Two times a day (BID) | ORAL | Status: DC

## 2014-11-18 MED ORDER — TICAGRELOR 90 MG PO TABS: 90.0000 mg | ORAL_TABLET | Freq: Two times a day (BID) | ORAL | Status: DC

## 2014-11-18 MED ORDER — LEVOTHYROXINE SODIUM 200 MCG PO TABS: 200.0000 ug | ORAL_TABLET | Freq: Every day | ORAL | Status: DC

## 2014-11-18 MED ORDER — LISINOPRIL 10 MG PO TABS: 10.0000 mg | ORAL_TABLET | Freq: Every day | ORAL | Status: DC

## 2014-11-18 MED ORDER — ASPIRIN 81 MG PO TBEC
81.0000 mg | DELAYED_RELEASE_TABLET | Freq: Every day | ORAL | Status: DC
Start: 2014-11-18 — End: 2016-02-23

## 2014-11-18 NOTE — Progress Notes (Signed)
[id number] Pt had already walked in hall without CP so did not walk. MI education completed with pt who voiced understanding. Stressed importance of brilinta with stent, MI restrictions, NTG use, ex ed and smoking cessation. Pt had seen diettian re watching carbs so we discussed diet choices and how to get A1C down. Pt receptive to ed. Pt did discuss several areas of stress that she is dealing with. Emotional support given. Discussed CRP 2 and pt gave permission to refer to [facility] Phase 2. Graylon Good RN BSN 11/18/2014 12:32 PM

## 2014-11-18 NOTE — Progress Notes (Signed)
Cardiology Progress Note  Subjective: No acute events overnight. She denies CP or dyspnea. She does note a little bloated dizziness that is somewhat positional. She felt rate this morning but feels a little nauseated upon my evaluation.  Objective: Vital signs in last 24 hours: Filed Vitals:   11/17/14 1330 11/17/14 1350 11/17/14 2006 11/18/14 0500  BP: 137/78 106/79 116/75 123/72  Pulse: 84 83 75 58  Temp:   99.4 F (37.4 C) 98.7 F (37.1 C)  TempSrc:   Oral Oral  Resp: 15 19    Height:    _0  (1.575 m)  Weight:    89.086 kg (196 lb 6.4 oz)  SpO2: 100% 100% 100% 100%   Weight change:   Intake/Output Summary (Last 24 hours) at 11/18/14 1649 Last data filed at 11/18/14 0454  Gross per 24 hour  Intake 1162.5 ml  Output      0 ml  Net 1162.5 ml   General: resting in bed in NAD HEENT: [place]/AT, no JVD or carotid bruits Cardiac: RRR, no rubs, murmurs or gallops; distal pulses intact Pulm:CTAB, Non-labored, no wheezes/rales/rhonchi, moving normal volumes of air Abd: soft, nontender, nondistended, BS present Ext: warm and well perfused, no pedal edema - cath site c/d/i, no hematoma Neuro: alert and oriented X3, responding appropriately, moving extremities spontaneously; normal mood and affect  Telemetry:  NSR 80s  Lab Results: Basic Metabolic Panel:  Recent Labs Lab 11/17/14 0658 11/18/14 0311  NA 130* 133*  K 4.3 3.6  CL 98* 102  CO2 21* 24  GLUCOSE 328* 283*  BUN 6 <5*  CREATININE 0.57 0.63  CALCIUM 8.4* 8.4*   Liver Function Tests:  Recent Labs Lab 11/15/14 1539  AST 13*  ALT 10*  ALKPHOS 87  BILITOT 0.9  PROT 7.2  ALBUMIN 4.3    Recent Labs Lab 11/16/14 0720  LIPASE 16*  AMYLASE 21*   CBC:  Recent Labs Lab 11/17/14 0658 11/18/14 0311  WBC 8.6 7.7  HGB 13.2 12.3  HCT 39.1 35.2*  MCV 88.3 87.6  PLT 232 221   Cardiac Enzymes:  Recent Labs Lab 11/16/14 0720 11/16/14 2018 11/17/14 0658  TROPONINI 1.38* 1.12* 1.30*    CBG:  Recent Labs Lab 11/17/14 0713 11/17/14 1202 11/17/14 1652 11/17/14 2009 11/18/14 0731 11/18/14 1146  GLUCAP 327* 228* 251* 245* 251* 270*   Hemoglobin A1C:  Recent Labs Lab 11/15/14 2105  HGBA1C 12.5*   Fasting Lipid Panel:  Recent Labs Lab 11/16/14 0213  CHOL 281*  HDL 33*  LDLCALC UNABLE TO CALCULATE IF TRIGLYCERIDE OVER 400 mg/dL  TRIG 561*  CHOLHDL 8.5   Thyroid Function Tests:  Recent Labs Lab 11/15/14 2105  TSH 59.363*  FREET4 0.73   Coagulation:  Recent Labs Lab 11/15/14 1751 11/15/14 2105  LABPROT 12.3 13.2  INR 0.91 0.99   Urinalysis:  Recent Labs Lab 11/15/14 1635  COLORURINE YELLOW  LABSPEC 1.042*  PHURINE 5.5  GLUCOSEU >1000*  HGBUR NEGATIVE  BILIRUBINUR NEGATIVE  KETONESUR NEGATIVE  PROTEINUR NEGATIVE  UROBILINOGEN 0.2  NITRITE NEGATIVE  LEUKOCYTESUR NEGATIVE   Studies/Results: No results found. 11/16/14 LHC:  Prox RCA to Mid RCA lesion, 100% stenosed.  Mid LAD to Dist LAD lesion, 90% stenosed. There is a 0% residual stenosis post intervention.  A drug-eluting stent x 2 was placed.  Mid Cx to Dist Cx lesion, 95% stenosed. There is a 0% residual stenosis post intervention.  A drug-eluting stent was placed.  Ost 1st Mrg to 1st Mrg  lesion, 90% stenosed. There is a 0% residual stenosis post intervention.  A drug-eluting stent was placed.  1. Severe 3 vessel obstructive CAD 2. Normal LV function 3. Successful stenting of the mid LAD with DES x 2. 4. Successful bifurcation stenting of the first OM and mid to distal LCx with DES. 5. Patient had some residual chest pain post procedure with mild ST elevation in the inferolateral leads. She did have a long period of ischemia during the procedure and I think some of her ischemia was related to compromise of collateral flow to the RCA. At the end of procedure she had TIMI 3 flow in all LCA vessels and well preserved collateral flow to the RCA.   Recommendation: will  observe in ICU overnight. Monitor serial cardiac enzymes and Ecg. Aggressive risk factor modification. DAPT therapy indefinitely. Will start with ASA and Brilinta. If cost is an issue could consider transition of Brilinta to plavix (with load) in 6 weeks.   Echo 5/19:  - Left ventricle: The cavity size was normal. Wall thickness was increased in a pattern of mild LVH. Systolic function was vigorous. The estimated ejection fraction was in the range of 65% to 70%.  Doppler parameters are consistent with abnormal left ventricular relaxation (grade 1 diastolic dysfunction). - Pericardium, extracardiac: A small pericardial effusion was identified.  Medications: I have reviewed the patient's current medications. Scheduled Meds: . aspirin EC  81 mg Oral Daily  . atorvastatin  80 mg Oral q1800  . carvedilol  3.125 mg Oral BID WC  . heparin  5,000 Units Subcutaneous 3 times per day  . insulin aspart  0-15 Units Subcutaneous TID WC  . insulin detemir  20 Units Subcutaneous QHS  . insulin starter kit- syringes  1 kit Other Once  . levothyroxine  200 mcg Oral QAC breakfast  . lisinopril  10 mg Oral Daily  . living well with diabetes book   Does not apply Once  . nicotine  21 mg Transdermal Daily  . sodium chloride  3 mL Intravenous Q12H  . ticagrelor  90 mg Oral BID   Continuous Infusions: . nitroGLYCERIN Stopped (11/17/14 0915)   PRN Meds:.sodium chloride, acetaminophen, acetaminophen-codeine, morphine injection, nitroGLYCERIN, ondansetron (ZOFRAN) IV, sodium chloride, zolpidem   Assessment/Plan:  54 year old woman with PMH of HTN, DM, HLD, tobacco use and hypothyroidism who presented to [facility] with CP and transferred to [facility] for further management of NSTEMI.  Principal Problem:   NSTEMI (non-ST elevated myocardial infarction) Active Problems:   CAD S/P percutaneous coronary angioplasty   Type IVa MI - Peri-PCI   Poorly controlled type 2 diabetes mellitus with circulatory disorder    Hyperlipidemia associated with type 2 diabetes mellitus   Essential hypertension   Hypothyroid   Cigarette smoker    NSTEMI:  S/p PCI with DES x 2 to mid and distal LAD, DES to LCX, DES to first OM.  Experienced peri-procedure Type 4 MI during cath.  EF by ventriculogram 55-65%.  No further angina - DAPT (ASA and Brilinta) indefinitely; can switch to Plavix in 6 weeks if cost is an issue - continue CRF modification (DM, HTN, HLD control, smoking cessation) - Coreg 3.18m BID, restart ACE inhibitor - lisinopril at 10 mg (was on 40 mg at home. Can likely titrate up as an outpatient.) - Lipitor 846mdaily - cardiac rehab - 2D ECHO with overall preserved EF. - from a cardiac standpoint she is stable for transfer to discharge today; Cardiology will continue to  follow with you and arrange outpatient follow-up (likely at [facility] - she actually would like to follow-up here in [place]. She can see me in the [facility] office, but will likely see an APP for initial post-hospital visit.  Essential hypertension:  Stable. Tolerated beta blocker. Starting one quarter home dose of ACE inhibitor.  Hyperlipidemia associated with type 2 diabetes mellitus:  Adding high intensity Lipitor as above.   Poorly controlled type 2 diabetes mellitus with circulatory disorder:  hgb A1c is 12.5.  She will need tighter control moving forward.  Per primary team.  Hypothyroid:  Per primary team.  Tobacco use disorder:  Wearing nicotine patch while inpatient.   Continue to encourage smoking cessation.    LOS: 3 days   Anticipate discharge either later on this afternoon or tomorrow.  Leonie Man, M.D., M.S. Interventional Cardiologist   Pager # [phone]

## 2014-11-18 NOTE — Progress Notes (Signed)
Pt return demonstrated giving insulin to self and understands how to use sliding scale insulin. Carroll Kinds RN

## 2014-11-18 NOTE — Progress Notes (Signed)
Medicare Important Message given? YES   (If response is "NO", the following Medicare IM given date fields will be blank)   Date Medicare IM given:  11-18-14 Medicare IM given by: Jacqlyn Krauss

## 2014-11-18 NOTE — Discharge Summary (Signed)
Physician Discharge Summary  Jacqueline Munoz BVQ:[id number] DOB: [birth year] DOA: 11/15/2014  PCP: Keane Police, MD  Admit date: 11/15/2014 Discharge date: 11/18/2014  Time spent: 45 minutes  Recommendations for Outpatient Follow-up:  1. Has a new appt with [place] [facility] due to financial issues especially, no medication coverage 2. F/u on TSH in 6-8 wks 3. Follow sugars and titrate insulin as needed  Discharge Condition: stable Diet recommendation: low sodium, heart healthy, diabetic diet  Discharge Diagnoses:  Principal Problem:   NSTEMI (non-ST elevated myocardial infarction) Active Problems:   Poorly controlled type 2 diabetes mellitus with circulatory disorder   Hyperlipidemia associated with type 2 diabetes mellitus   Essential hypertension   Hypothyroid   CAD S/P percutaneous coronary angioplasty   Type IVa MI - Peri-PCI   Cigarette smoker   History of present illness:  Jacqueline Munoz is a 54 y.o. female HTN, DM, HLD and tobacco use who presented to [facility] ED with Chest pain which she described as a tightness and suspect was heartburn. She had been seen at the [facility] recently for similar symptoms and discharged from the ER.   Hospital Course:  Chest pain/ NSTEMI - Troponin found to be elevated- max Troponin 1.84- see labs below - Underwent cardiac cath 5/18- full report mentioned below-noted to have severe 3 vessel obstructive coronary artery disease-stenting of the mid LAD done with 2 DES stents, stenting of bifurcation of first OM and mid to distal left circumflex with DES stent -Was transitioned to cardiac ICU after cath and is stable for discharge home today - Has been started on aspirin and Brillinta by cardiology - she is to continue [place] for 1 month for which she will receive samples-afterwards can be transitioned to Plavix if affordability is a concern - she will f/u with [facility] after discharge  Active Problems: Hyponatremia -NA 128 on  admission--possibly dehydrated in relation to high sugars and resultant polyuria-  - improved to 133 with IV NS   Diabetes mellitus without complication - has been on Metformin 500 BID only- CBG 547 on admission - A1c found to be 12.5 -patient given teaching in regards to transitioning to insulin- Started Levemir- cont Novolog SSI - Prescription given for Glucometer - Cont Metformin as well -Discussed with patient in detail - educated extensively on the complications of DM and on need to bring A1c down to at least 7   Hyperlipidemia associated with type 2 diabetes mellitus Lipid Panel   Labs (Brief)       Component Value Date/Time   CHOL 281* 11/16/2014 0213   TRIG 561* 11/16/2014 0213   HDL 33* 11/16/2014 0213   CHOLHDL 8.5 11/16/2014 0213   VLDL UNABLE TO CALCULATE IF TRIGLYCERIDE OVER 400 mg/dL 11/16/2014 0213   LDLCALC UNABLE TO CALCULATE IF TRIGLYCERIDE OVER 400 mg/dL 11/16/2014 8003     - Has been started on Lipitor - control DM to get Triglycerides under control   HTN (hypertension) - cont Lisinopril at 10 mg rather than 40 mg as BP is well controlled with Coreg that was added for cardiac reasons during this admission   Hypothyroid - TSH elevated at 59.363, Free T4 0.73 - have increased Synthroid from 175 mcg to 200 - repeat TFTs as outpt  Tobacco abuse - advised to stop smoking- she is highly motivated to quit  Right knee pain after a fall - xrays performed reveal moderate joint effusion without fractures - swelling in knee has improved steadily and is only mildly swollen  on exam today - pain controlled with Tylenol # 3       Procedures: Cardiac cath 5/18  Prox RCA to Mid RCA lesion, 100% stenosed.  Mid LAD to Dist LAD lesion, 90% stenosed. There is a 0% residual stenosis post intervention.  A drug-eluting stent x 2 was placed.  Mid Cx to Dist Cx lesion, 95% stenosed. There is a 0% residual stenosis post  intervention.  A drug-eluting stent was placed.  Ost 1st Mrg to 1st Mrg lesion, 90% stenosed. There is a 0% residual stenosis post intervention.  A drug-eluting stent was placed.  1. Severe 3 vessel obstructive CAD 2. Normal LV function 3. Successful stenting of the mid LAD with DES x 2. 4. Successful bifurcation stenting of the first OM and mid to distal LCx with DES. 5. Patient had some residual chest pain post procedure with mild ST elevation in the inferolateral leads. She did have a long period of ischemia during the procedure and I think some of her ischemia was related to compromise of collateral flow to the RCA. At the end of procedure she had TIMI 3 flow in all LCA vessels and well preserved collateral flow to the RCA.   2D ECHO 5/19 Left ventricle: The cavity size was normal. Wall thickness was increased in a pattern of mild LVH. Systolic function was vigorous. The estimated ejection fraction was in the range of 65% to 70%. Doppler parameters are consistent with abnormal left ventricular relaxation (grade 1 diastolic dysfunction). - Pericardium, extracardiac: A small pericardial effusion was identified.  Consultations:  cardiology  Discharge Exam: Filed Weights   11/15/14 1629 11/16/14 0400 11/18/14 0500  Weight: 93.895 kg (207 lb) 89.812 kg (198 lb) 89.086 kg (196 lb 6.4 oz)   Filed Vitals:   11/18/14 0500  BP: 123/72  Pulse: 58  Temp: 98.7 F (37.1 C)  Resp:     General: AAO x 3, no distress Cardiovascular: RRR, no murmurs  Respiratory: clear to auscultation bilaterally GI: soft, non-tender, non-distended, bowel sound positive  Discharge Instructions You were cared for by a hospitalist during your hospital stay. If you have any questions about your discharge medications or the care you received while you were in the hospital after you are discharged, you can call the unit and asked to speak with the hospitalist on call if the hospitalist that took  care of you is not available. Once you are discharged, your primary care physician will handle any further medical issues. Please note that NO REFILLS for any discharge medications will be authorized once you are discharged, as it is imperative that you return to your primary care physician (or establish a relationship with a primary care physician if you do not have one) for your aftercare needs so that they can reassess your need for medications and monitor your lab values.      Discharge Instructions    Discharge instructions    Complete by:  As directed   Carb modified, low sodium, heart healthy diet     Increase activity slowly    Complete by:  As directed             Medication List    STOP taking these medications        ibuprofen 200 MG tablet  Commonly known as:  ADVIL,MOTRIN      TAKE these medications        acetaminophen-codeine 300-30 MG per tablet  Commonly known as:  TYLENOL #3  Take 1-2 tablets  by mouth every 4 (four) hours as needed for moderate pain.     aspirin 81 MG EC tablet  Take 1 tablet (81 mg total) by mouth daily.     atorvastatin 80 MG tablet  Commonly known as:  LIPITOR  Take 1 tablet (80 mg total) by mouth daily at 6 PM.     carvedilol 3.125 MG tablet  Commonly known as:  COREG  Take 1 tablet (3.125 mg total) by mouth 2 (two) times daily with a meal.     glucose monitoring kit monitoring kit  1 each by Does not apply route 4 (four) times daily - after meals and at bedtime. 1 month Diabetic Testing Supplies for QAC-QHS accuchecks.     insulin aspart 100 UNIT/ML FlexPen  Commonly known as:  NOVOLOG FLEXPEN  Inject 2-10 Units into the skin 3 (three) times daily with meals. Follow Sliding scale provided     Insulin Detemir 100 UNIT/ML Pen  Commonly known as:  LEVEMIR FLEXPEN  Inject 26 Units into the skin at bedtime.     levothyroxine 200 MCG tablet  Commonly known as:  SYNTHROID, LEVOTHROID  Take 1 tablet (200 mcg total) by mouth daily  before breakfast.     lisinopril 10 MG tablet  Commonly known as:  PRINIVIL,ZESTRIL  Take 1 tablet (10 mg total) by mouth daily.     metFORMIN 500 MG tablet  Commonly known as:  GLUCOPHAGE  Take 1 tablet (500 mg total) by mouth 2 (two) times daily with a meal.  Start taking on:  11/20/2014     ticagrelor 90 MG Tabs tablet  Commonly known as:  BRILINTA  Take 1 tablet (90 mg total) by mouth 2 (two) times daily.       Allergies  Allergen Reactions  . Corticosteroids Anaphylaxis    ALL steroids.     . Penicillins Anaphylaxis   Follow-up Information    Follow up with [place]     On 11/23/2014.   Why:  appt time at 10:30am, will have 30.00 copay for medicare if no supplement   Contact information:   [address] [place] [address] [phone]      Follow up with Leonie Man, MD.   Specialty:  Cardiology   Why:  the office will call you for appt- If they do not call, please call them and ask for a follow up appt   Contact information:   [place] [place] [address] [phone]        The results of significant diagnostics from this hospitalization (including imaging, microbiology, ancillary and laboratory) are listed below for reference.    Significant Diagnostic Studies: Dg Chest 2 View  11/15/2014   CLINICAL DATA:  54 year old female with increased urination and dizziness. Hyperglycemia. Recent central chest pain. Initial encounter.  EXAM: CHEST  2 VIEW  COMPARISON:  None.  No acute osseous abnormality identified.  FINDINGS: Lung volumes are within normal limits, mild eventration of the right hemidiaphragm. Normal cardiac size and mediastinal contours. Visualized tracheal air column is within normal limits. Small calcified granuloma superior to the right hilum. Otherwise the lungs are clear. No pneumothorax or pleural effusion. No acute osseous abnormality identified.  IMPRESSION: No acute  cardiopulmonary abnormality.   Electronically Signed   By: Genevie Ann M.D.   On: 11/15/2014 16:05   Dg Knee Complete 4 Views Right  11/15/2014   CLINICAL DATA:  Hyperglycemia.  Knee pain.  EXAM: RIGHT KNEE - COMPLETE 4+ VIEW  COMPARISON:  None.  FINDINGS: There is no evidence of fracture or dislocation. There is a moderate joint effusion. There is no evidence of arthropathy or other focal bone abnormality. Soft tissues are unremarkable.  IMPRESSION: No acute osseous injury of the right knee. Moderate nonspecific joint effusion.   Electronically Signed   By: Kathreen Devoid   On: 11/15/2014 16:05    Microbiology: Recent Results (from the past 240 hour(s))  MRSA PCR Screening     Status: None   Collection Time: 11/15/14  8:27 PM  Result Value Ref Range Status   MRSA by PCR NEGATIVE NEGATIVE Final    Comment:        The GeneXpert MRSA Assay (FDA approved for NASAL specimens only), is one component of a comprehensive MRSA colonization surveillance program. It is not intended to diagnose MRSA infection nor to guide or monitor treatment for MRSA infections.      Labs: Basic Metabolic Panel:  Recent Labs Lab 11/15/14 1539 11/16/14 0720 11/16/14 1514 11/16/14 2018 11/17/14 0658 11/18/14 0311  NA 128* 128* 130*  --  130* 133*  K 4.4 3.2* 3.9  --  4.3 3.6  CL 91* 93* 94*  --  98* 102  CO2 $Re'26 26 26  'iJr$ --  21* 24  GLUCOSE 547* 293* 240*  --  328* 283*  BUN $Re'10 10 8  'qVw$ --  6 <5*  CREATININE 0.78 0.64 0.60 0.59 0.57 0.63  CALCIUM 9.4 8.5* 8.7*  --  8.4* 8.4*   Liver Function Tests:  Recent Labs Lab 11/15/14 1539  AST 13*  ALT 10*  ALKPHOS 87  BILITOT 0.9  PROT 7.2  ALBUMIN 4.3    Recent Labs Lab 11/16/14 0720  LIPASE 16*  AMYLASE 21*   No results for input(s): AMMONIA in the last 168 hours. CBC:  Recent Labs Lab 11/15/14 1539 11/16/14 2018 11/17/14 0658 11/18/14 0311  WBC 8.7 7.7 8.6 7.7  HGB 13.2 12.7 13.2 12.3  HCT 38.2 37.4 39.1 35.2*  MCV 87.8 87.4 88.3 87.6   PLT 259 235 232 221   Cardiac Enzymes:  Recent Labs Lab 11/15/14 2105 11/16/14 0213 11/16/14 0720 11/16/14 2018 11/17/14 0658  TROPONINI 1.84* 1.65* 1.38* 1.12* 1.30*   BNP: BNP (last 3 results) No results for input(s): BNP in the last 8760 hours.  ProBNP (last 3 results) No results for input(s): PROBNP in the last 8760 hours.  CBG:  Recent Labs Lab 11/17/14 0713 11/17/14 1202 11/17/14 1652 11/17/14 2009 11/18/14 0731  GLUCAP 327* 228* 251* 245* 251*       SignedDebbe Odea, MD [facility] 11/18/2014, 9:01 AM

## 2014-11-18 NOTE — Plan of Care (Signed)
Problem: Food- and Nutrition-Related Knowledge Deficit (NB-1.1) Goal: Nutrition education Formal process to instruct or train a patient/client in a skill or to impart knowledge to help patients/clients voluntarily manage or modify food choices and eating behavior to maintain or improve health. Outcome: Completed/Met Date Met:  11/18/14  RD consulted for nutrition education regarding diabetes/low sodium.    Lab Results  Component Value Date    HGBA1C 12.5* 11/15/2014    RD provided "Carbohydrate Counting for People with Diabetes" handout from the Academy of Nutrition and Dietetics. Discussed different food groups and their effects on blood sugar, emphasizing carbohydrate-containing foods. Provided list of carbohydrates and recommended serving sizes of common foods.  Discussed importance of controlled and consistent carbohydrate intake throughout the day. Provided examples of ways to balance meals/snacks and encouraged intake of high-fiber, whole grain complex carbohydrates. Teach back method used.  Expect good compliance.  Body mass index is 35.91 kg/(m^2). Pt meets criteria for Obesity Class II based on current BMI.  Current diet order is Heart Healthy/Carbohydrate Modified, patient is consuming approximately 100% of meals at this time. Labs and medications reviewed. No further nutrition interventions warranted at this time. If additional nutrition issues arise, please re-consult RD.  Arthur Holms, RD, LDN Pager #: [phone] After-Hours Pager #: [phone]

## 2014-11-23 ENCOUNTER — Ambulatory Visit: Payer: Medicare Other

## 2014-11-23 ENCOUNTER — Telehealth: Payer: Self-pay | Admitting: Family Medicine

## 2014-11-23 ENCOUNTER — Encounter: Payer: Self-pay | Admitting: Family Medicine

## 2014-11-23 ENCOUNTER — Ambulatory Visit: Payer: Medicare Other | Attending: Family Medicine | Admitting: Family Medicine

## 2014-11-23 VITALS — BP 123/83 | HR 70 | Temp 98.3°F | Ht 62.0 in | Wt 208.0 lb

## 2014-11-23 DIAGNOSIS — I2510 Atherosclerotic heart disease of native coronary artery without angina pectoris: Secondary | ICD-10-CM | POA: Diagnosis not present

## 2014-11-23 DIAGNOSIS — Z720 Tobacco use: Secondary | ICD-10-CM | POA: Diagnosis not present

## 2014-11-23 DIAGNOSIS — M25561 Pain in right knee: Secondary | ICD-10-CM | POA: Insufficient documentation

## 2014-11-23 DIAGNOSIS — Z955 Presence of coronary angioplasty implant and graft: Secondary | ICD-10-CM | POA: Diagnosis not present

## 2014-11-23 DIAGNOSIS — I10 Essential (primary) hypertension: Secondary | ICD-10-CM | POA: Diagnosis not present

## 2014-11-23 DIAGNOSIS — Z9861 Coronary angioplasty status: Secondary | ICD-10-CM

## 2014-11-23 DIAGNOSIS — F32A Depression, unspecified: Secondary | ICD-10-CM | POA: Insufficient documentation

## 2014-11-23 DIAGNOSIS — F17210 Nicotine dependence, cigarettes, uncomplicated: Secondary | ICD-10-CM | POA: Insufficient documentation

## 2014-11-23 DIAGNOSIS — F419 Anxiety disorder, unspecified: Secondary | ICD-10-CM | POA: Diagnosis not present

## 2014-11-23 DIAGNOSIS — F329 Major depressive disorder, single episode, unspecified: Secondary | ICD-10-CM | POA: Insufficient documentation

## 2014-11-23 DIAGNOSIS — E039 Hypothyroidism, unspecified: Secondary | ICD-10-CM | POA: Insufficient documentation

## 2014-11-23 DIAGNOSIS — E1165 Type 2 diabetes mellitus with hyperglycemia: Secondary | ICD-10-CM

## 2014-11-23 DIAGNOSIS — E038 Other specified hypothyroidism: Secondary | ICD-10-CM

## 2014-11-23 DIAGNOSIS — R1012 Left upper quadrant pain: Secondary | ICD-10-CM

## 2014-11-23 DIAGNOSIS — F418 Other specified anxiety disorders: Secondary | ICD-10-CM

## 2014-11-23 LAB — GLUCOSE, POCT (MANUAL RESULT ENTRY): POC GLUCOSE: 219 mg/dL — AB (ref 70–99)

## 2014-11-23 MED ORDER — NITROGLYCERIN 0.4 MG SL SUBL: 0.4000 mg | SUBLINGUAL_TABLET | SUBLINGUAL | Status: DC | PRN

## 2014-11-23 MED ORDER — INSULIN DETEMIR 100 UNIT/ML FLEXPEN: 35.0000 [IU] | PEN_INJECTOR | Freq: Every day | SUBCUTANEOUS | Status: DC

## 2014-11-23 MED ORDER — "NEEDLE (DISP) 30G X 1/2"" MISC": 1.0000 | Freq: Three times a day (TID) | Status: DC

## 2014-11-23 NOTE — Progress Notes (Signed)
Patient here to establish care after NSTEMI and 4 stents Patient has fibromyalgia and is complaining of left upper abdominal pain for 1 year Last A1c was 12.5 Patient takes all medications and does not need refills Patient was told to wait a couple of weeks for excersizing She is complaining of a fall several weeks ago and fell on left knee 1+ edema in hands and feet CBG 219 Patient is smoking marijuana and 1 ppd cigarettes

## 2014-11-23 NOTE — Patient Instructions (Signed)
Diabetes Mellitus and Food It is important for you to manage your blood sugar (glucose) level. Your blood glucose level can be greatly affected by what you eat. Eating healthier foods in the appropriate amounts throughout the day at about the same time each day will help you control your blood glucose level. It can also help slow or prevent worsening of your diabetes mellitus. Healthy eating may even help you improve the level of your blood pressure and reach or maintain a healthy weight.  HOW CAN FOOD AFFECT ME? Carbohydrates Carbohydrates affect your blood glucose level more than any other type of food. Your dietitian will help you determine how many carbohydrates to eat at each meal and teach you how to count carbohydrates. Counting carbohydrates is important to keep your blood glucose at a healthy level, especially if you are using insulin or taking certain medicines for diabetes mellitus. Alcohol Alcohol can cause sudden decreases in blood glucose (hypoglycemia), especially if you use insulin or take certain medicines for diabetes mellitus. Hypoglycemia can be a life-threatening condition. Symptoms of hypoglycemia (sleepiness, dizziness, and disorientation) are similar to symptoms of having too much alcohol.  If your health care provider has given you approval to drink alcohol, do so in moderation and use the following guidelines:  Women should not have more than one drink per day, and men should not have more than two drinks per day. One drink is equal to:  12 oz of beer.  5 oz of wine.  1 oz of hard liquor.  Do not drink on an empty stomach.  Keep yourself hydrated. Have water, diet soda, or unsweetened iced tea.  Regular soda, juice, and other mixers might contain a lot of carbohydrates and should be counted. WHAT FOODS ARE NOT RECOMMENDED? As you make food choices, it is important to remember that all foods are not the same. Some foods have fewer nutrients per serving than other  foods, even though they might have the same number of calories or carbohydrates. It is difficult to get your body what it needs when you eat foods with fewer nutrients. Examples of foods that you should avoid that are high in calories and carbohydrates but low in nutrients include:  Trans fats (most processed foods list trans fats on the Nutrition Facts label).  Regular soda.  Juice.  Candy.  Sweets, such as cake, pie, doughnuts, and cookies.  Fried foods. WHAT FOODS CAN I EAT? Have nutrient-rich foods, which will nourish your body and keep you healthy. The food you should eat also will depend on several factors, including:  The calories you need.  The medicines you take.  Your weight.  Your blood glucose level.  Your blood pressure level.  Your cholesterol level. You also should eat a variety of foods, including:  Protein, such as meat, poultry, fish, tofu, nuts, and seeds (lean animal proteins are best).  Fruits.  Vegetables.  Dairy products, such as milk, cheese, and yogurt (low fat is best).  Breads, grains, pasta, cereal, rice, and beans.  Fats such as olive oil, trans fat-free margarine, canola oil, avocado, and olives. DOES EVERYONE WITH DIABETES MELLITUS HAVE THE SAME MEAL PLAN? Because every person with diabetes mellitus is different, there is not one meal plan that works for everyone. It is very important that you meet with a dietitian who will help you create a meal plan that is just right for you. Document Released: 03/14/2005 Document Revised: 06/22/2013 Document Reviewed: 05/14/2013 Exit[employer] Patient Information 2015 [employer]. This   information is not intended to replace advice given to you by your health care provider. Make sure you discuss any questions you have with your health care provider.  
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
## 2014-11-24 NOTE — Progress Notes (Signed)
Quick Note:  Labs addressed at office as well as medications and patient was made aware. ______ 

## 2014-11-25 ENCOUNTER — Telehealth: Payer: Self-pay | Admitting: Cardiology

## 2014-11-25 MED ORDER — FUROSEMIDE 20 MG PO TABS: 10.0000 mg | ORAL_TABLET | Freq: Every day | ORAL | Status: DC

## 2014-11-25 NOTE — Telephone Encounter (Signed)
Pt was discharge from the hospital last Friday,she had a heart attack. Now below her knee and hands are swelling,please call to advise.

## 2014-11-25 NOTE — Telephone Encounter (Signed)
Spoke with patient. She had MI and stents and was discharged home on Friday 5/20. She reports increasing bilateral LE edema and states she "has no ankles". Also reports swelling up to below her knees and in her hands. She has tenderness in her right less but fell before her MIO. She has no discoloration in legs apart from bruises from fall. She states her weight on discharge was 196 lbs and on Wednesday when she went to PCP for follow up and her weight was 208lbs. She reports her symptoms of bilateral LE edema to PCP but not changes were made. She has no other complaints and denies SOB.

## 2014-11-25 NOTE — Telephone Encounter (Signed)
Spoke with DOD Dr. Percival Spanish who advised that patient take furosemide 10mg  daily for 3 days. She will call back if her symptoms have not improved.  Rx(s) sent to pharmacy electronically.

## 2014-11-30 NOTE — Telephone Encounter (Signed)
No encounter notes exist.

## 2014-12-01 ENCOUNTER — Emergency Department (HOSPITAL_COMMUNITY): Payer: Medicare Other

## 2014-12-01 ENCOUNTER — Encounter (HOSPITAL_COMMUNITY): Payer: Self-pay | Admitting: Emergency Medicine

## 2014-12-01 DIAGNOSIS — G4733 Obstructive sleep apnea (adult) (pediatric): Secondary | ICD-10-CM | POA: Diagnosis present

## 2014-12-01 DIAGNOSIS — E1159 Type 2 diabetes mellitus with other circulatory complications: Secondary | ICD-10-CM | POA: Diagnosis present

## 2014-12-01 DIAGNOSIS — R300 Dysuria: Secondary | ICD-10-CM | POA: Diagnosis present

## 2014-12-01 DIAGNOSIS — Z6837 Body mass index (BMI) 37.0-37.9, adult: Secondary | ICD-10-CM

## 2014-12-01 DIAGNOSIS — Z87442 Personal history of urinary calculi: Secondary | ICD-10-CM

## 2014-12-01 DIAGNOSIS — E1165 Type 2 diabetes mellitus with hyperglycemia: Secondary | ICD-10-CM | POA: Diagnosis present

## 2014-12-01 DIAGNOSIS — I10 Essential (primary) hypertension: Secondary | ICD-10-CM | POA: Diagnosis present

## 2014-12-01 DIAGNOSIS — I25110 Atherosclerotic heart disease of native coronary artery with unstable angina pectoris: Secondary | ICD-10-CM | POA: Diagnosis not present

## 2014-12-01 DIAGNOSIS — M797 Fibromyalgia: Secondary | ICD-10-CM | POA: Diagnosis present

## 2014-12-01 DIAGNOSIS — E785 Hyperlipidemia, unspecified: Secondary | ICD-10-CM | POA: Diagnosis not present

## 2014-12-01 DIAGNOSIS — E876 Hypokalemia: Secondary | ICD-10-CM | POA: Diagnosis present

## 2014-12-01 DIAGNOSIS — I214 Non-ST elevation (NSTEMI) myocardial infarction: Secondary | ICD-10-CM | POA: Diagnosis present

## 2014-12-01 DIAGNOSIS — I252 Old myocardial infarction: Secondary | ICD-10-CM

## 2014-12-01 DIAGNOSIS — M1990 Unspecified osteoarthritis, unspecified site: Secondary | ICD-10-CM | POA: Diagnosis present

## 2014-12-01 DIAGNOSIS — F419 Anxiety disorder, unspecified: Secondary | ICD-10-CM | POA: Diagnosis present

## 2014-12-01 DIAGNOSIS — R079 Chest pain, unspecified: Secondary | ICD-10-CM | POA: Diagnosis not present

## 2014-12-01 DIAGNOSIS — Z955 Presence of coronary angioplasty implant and graft: Secondary | ICD-10-CM

## 2014-12-01 DIAGNOSIS — Z7982 Long term (current) use of aspirin: Secondary | ICD-10-CM

## 2014-12-01 DIAGNOSIS — Z880 Allergy status to penicillin: Secondary | ICD-10-CM

## 2014-12-01 DIAGNOSIS — I2582 Chronic total occlusion of coronary artery: Secondary | ICD-10-CM | POA: Diagnosis present

## 2014-12-01 DIAGNOSIS — Z888 Allergy status to other drugs, medicaments and biological substances status: Secondary | ICD-10-CM

## 2014-12-01 DIAGNOSIS — Z794 Long term (current) use of insulin: Secondary | ICD-10-CM

## 2014-12-01 DIAGNOSIS — F17210 Nicotine dependence, cigarettes, uncomplicated: Secondary | ICD-10-CM | POA: Diagnosis present

## 2014-12-01 DIAGNOSIS — Z7902 Long term (current) use of antithrombotics/antiplatelets: Secondary | ICD-10-CM

## 2014-12-01 DIAGNOSIS — Z8711 Personal history of peptic ulcer disease: Secondary | ICD-10-CM

## 2014-12-01 DIAGNOSIS — E669 Obesity, unspecified: Secondary | ICD-10-CM | POA: Diagnosis present

## 2014-12-01 DIAGNOSIS — R0789 Other chest pain: Secondary | ICD-10-CM | POA: Diagnosis not present

## 2014-12-01 DIAGNOSIS — F329 Major depressive disorder, single episode, unspecified: Secondary | ICD-10-CM | POA: Diagnosis present

## 2014-12-01 DIAGNOSIS — E039 Hypothyroidism, unspecified: Secondary | ICD-10-CM | POA: Diagnosis present

## 2014-12-01 IMAGING — CR DG CHEST 2V
2 series · 2 of 2 positions shown · non-contrast
Comparison: None.

CLINICAL DATA: Acute onset of left-sided chest pain and shortness
of breath. Initial encounter.

EXAM:
CHEST  2 VIEW

[chest pa]
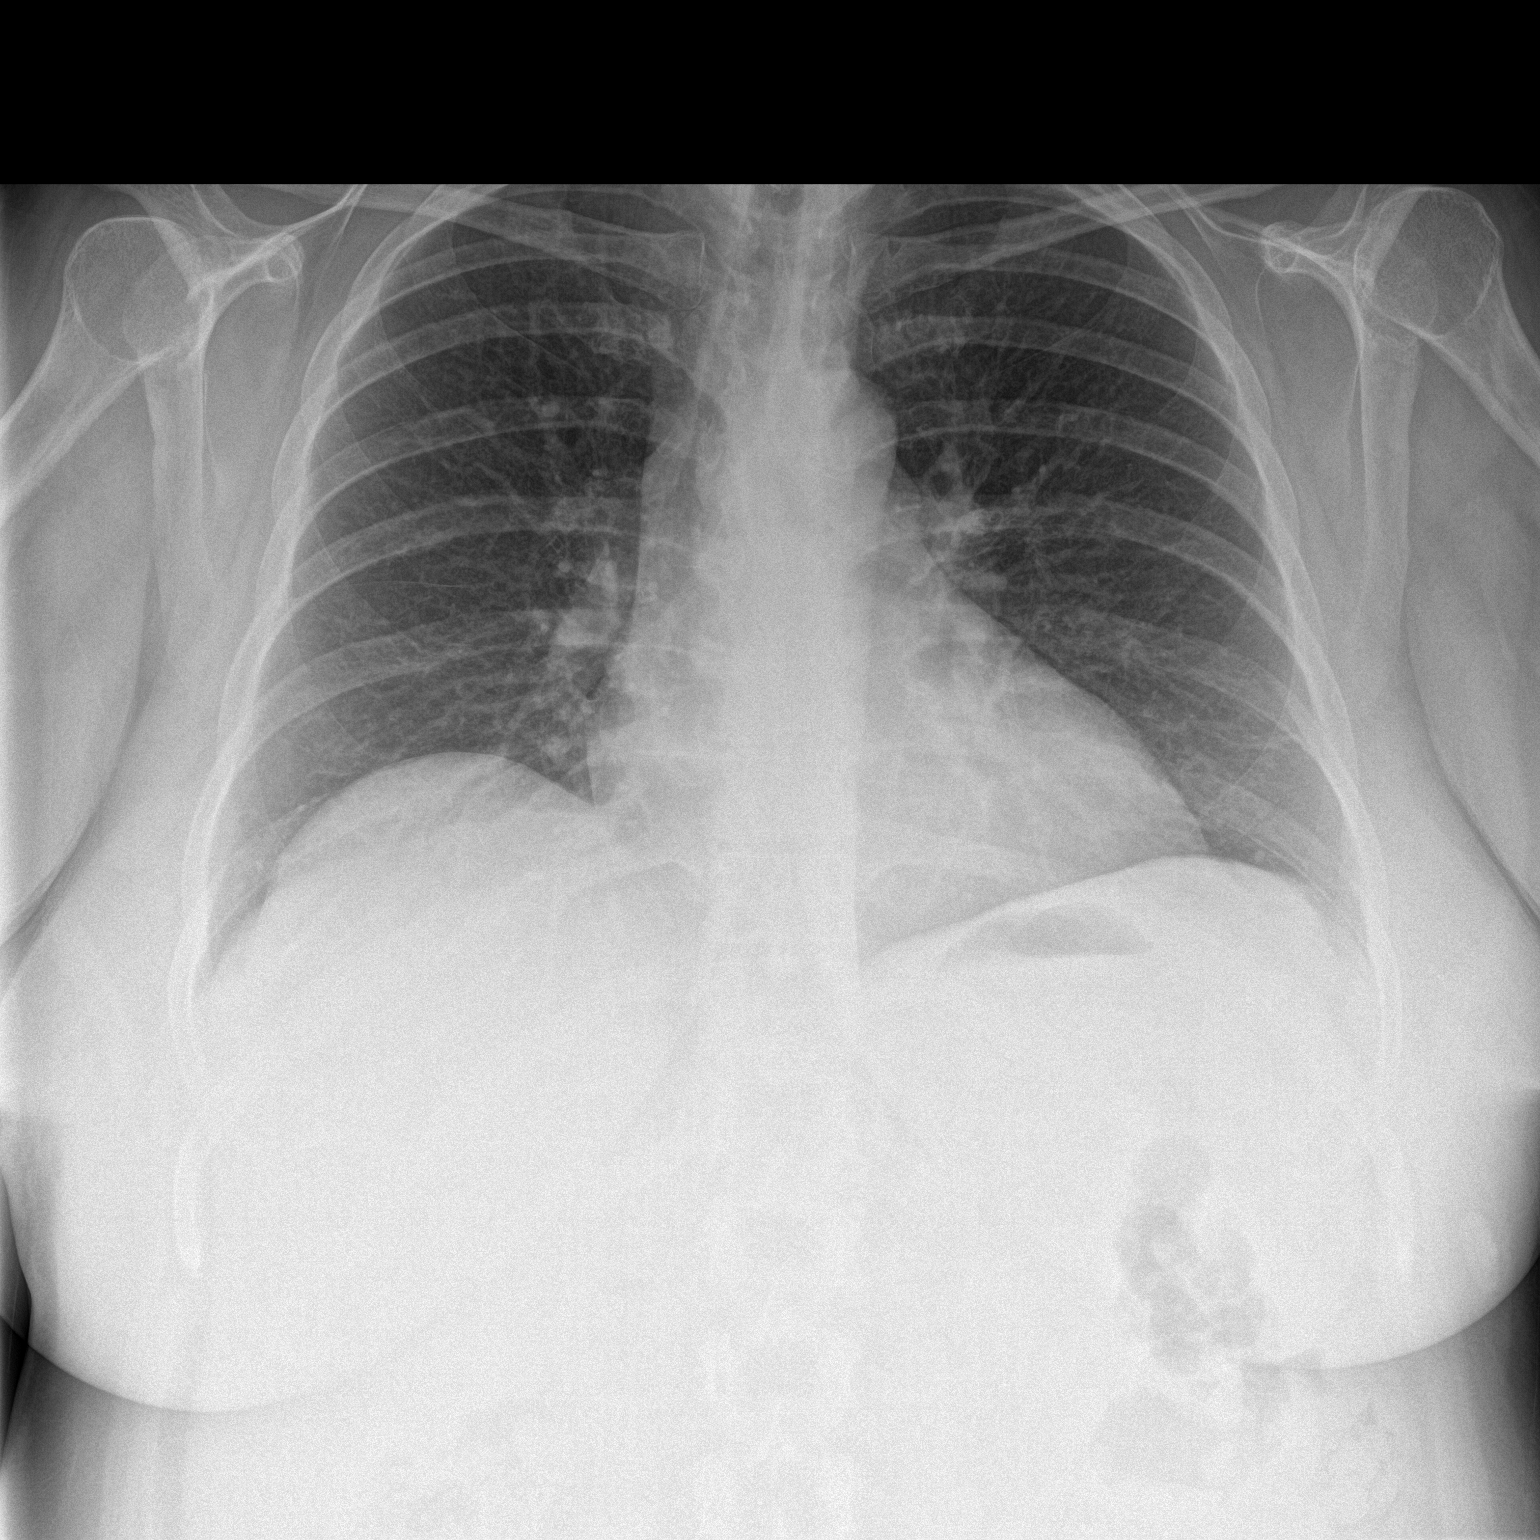

[chest lat]
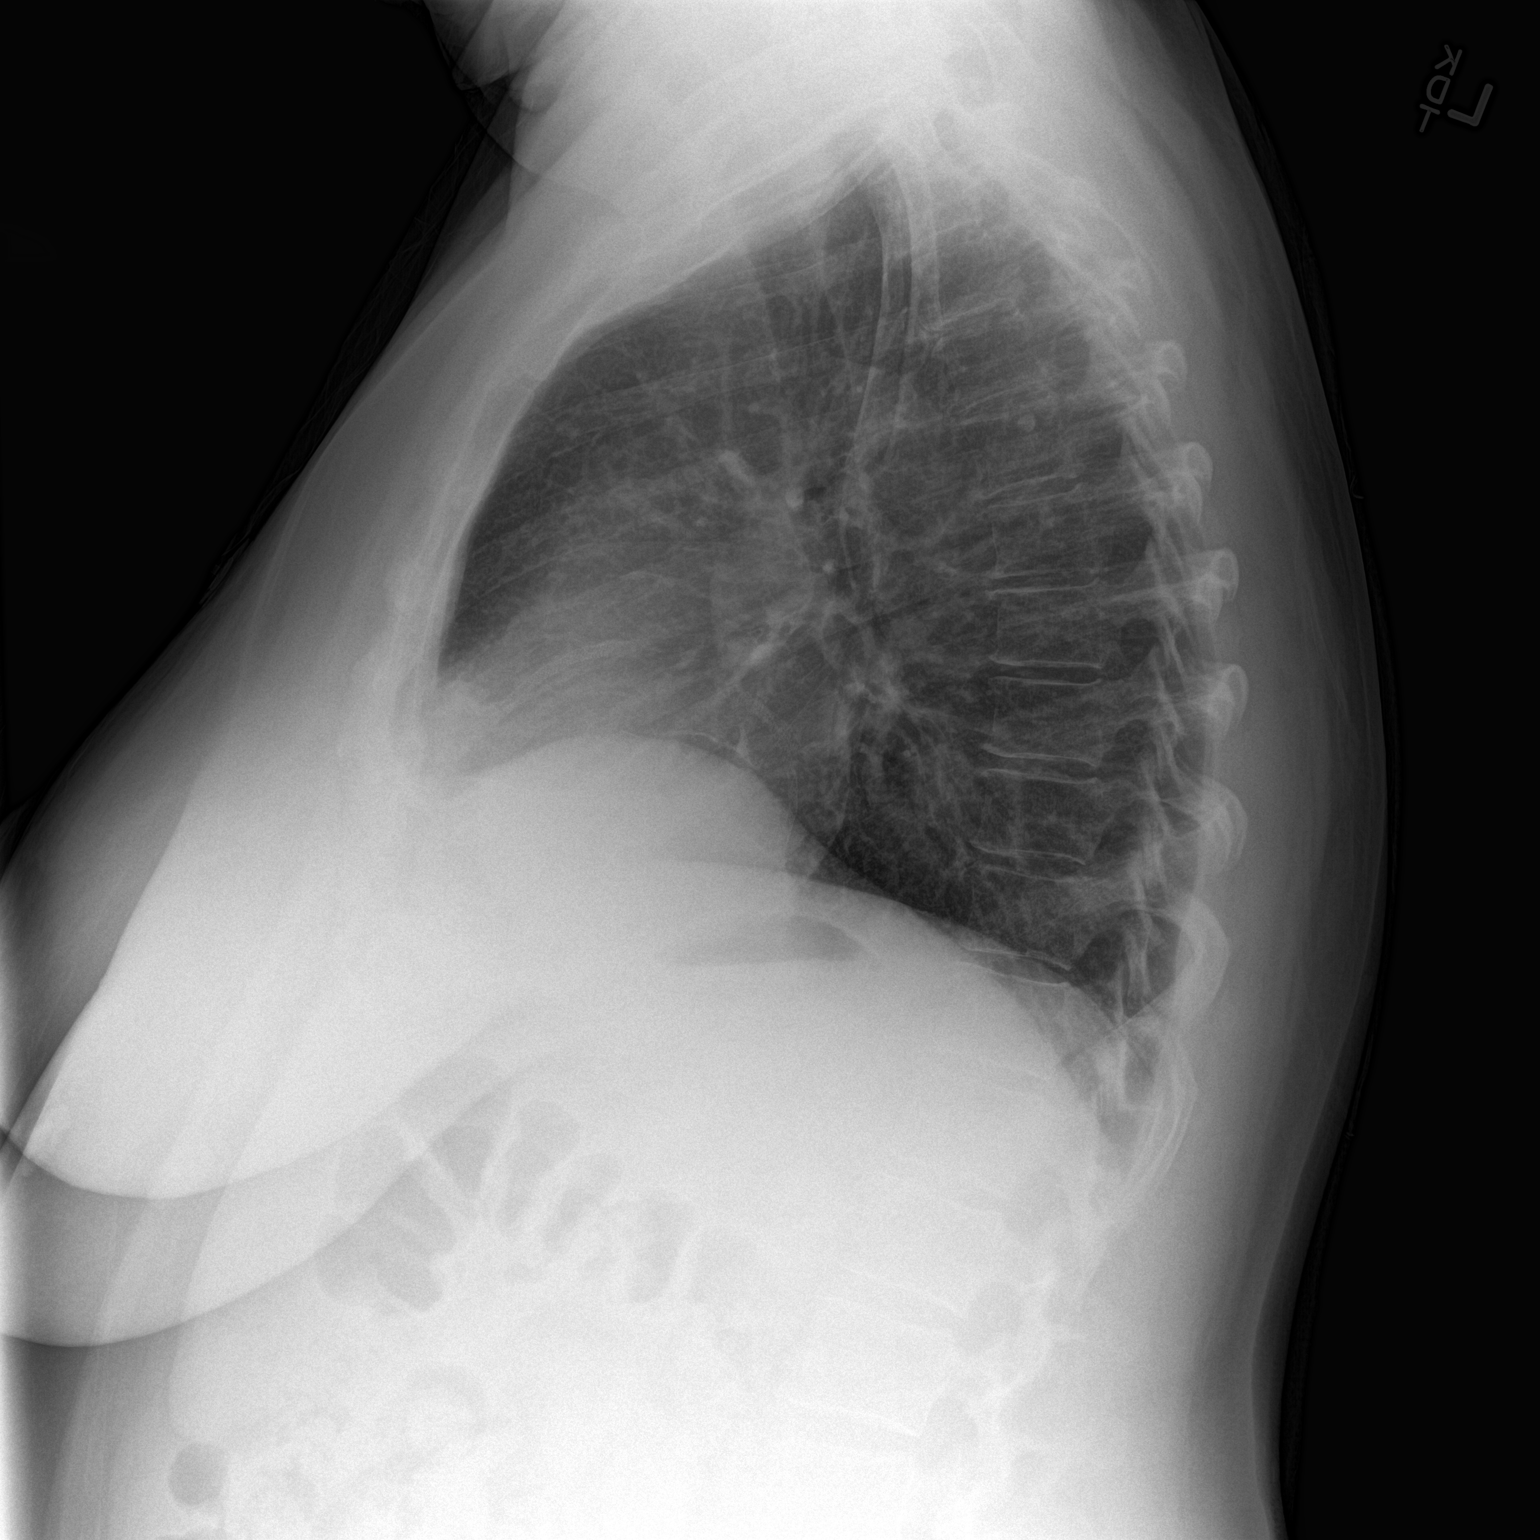

[2 of 2 positions shown; findings below may reference images not displayed]

FINDINGS: The lungs are well-aerated. Minimal left basilar atelectasis is
noted. There is no evidence of pleural effusion or pneumothorax.

The heart is normal in size; the mediastinal contour is within
normal limits. No acute osseous abnormalities are seen.
IMPRESSION: Minimal left basilar atelectasis noted.  Lungs otherwise clear.

## 2014-12-01 NOTE — ED Notes (Signed)
Pt. reports left chest pain with SOB and nausea onset this evening , denies  emesis or diaphoresis , history of CAD / NSTEMI her cardiologist is Dr. Ellyn Hack .

## 2014-12-01 NOTE — ED Notes (Signed)
Pt stated concern about her ears hurting as well.  Stated her equilibrium has been off.

## 2014-12-02 ENCOUNTER — Other Ambulatory Visit (HOSPITAL_COMMUNITY): Payer: Self-pay

## 2014-12-02 ENCOUNTER — Inpatient Hospital Stay (HOSPITAL_COMMUNITY): Payer: Medicare Other

## 2014-12-02 ENCOUNTER — Encounter (HOSPITAL_COMMUNITY)
Admission: EM | Disposition: A | Discharge status: Home / Self Care | Payer: Medicare Other | Source: Home / Self Care | Attending: Internal Medicine

## 2014-12-02 ENCOUNTER — Encounter (HOSPITAL_COMMUNITY): Payer: Self-pay | Admitting: Internal Medicine

## 2014-12-02 ENCOUNTER — Inpatient Hospital Stay (HOSPITAL_COMMUNITY)
Admission: EM | Admit: 2014-12-02 | Discharge: 2014-12-03 | DRG: 246 | Disposition: A | Discharge status: Home / Self Care | Payer: Medicare Other | Attending: Internal Medicine | Admitting: Internal Medicine

## 2014-12-02 DIAGNOSIS — E1169 Type 2 diabetes mellitus with other specified complication: Secondary | ICD-10-CM

## 2014-12-02 DIAGNOSIS — E1151 Type 2 diabetes mellitus with diabetic peripheral angiopathy without gangrene: Secondary | ICD-10-CM

## 2014-12-02 DIAGNOSIS — I214 Non-ST elevation (NSTEMI) myocardial infarction: Secondary | ICD-10-CM | POA: Diagnosis present

## 2014-12-02 DIAGNOSIS — E876 Hypokalemia: Secondary | ICD-10-CM | POA: Diagnosis not present

## 2014-12-02 DIAGNOSIS — M797 Fibromyalgia: Secondary | ICD-10-CM | POA: Diagnosis present

## 2014-12-02 DIAGNOSIS — Z888 Allergy status to other drugs, medicaments and biological substances status: Secondary | ICD-10-CM | POA: Diagnosis not present

## 2014-12-02 DIAGNOSIS — Z7982 Long term (current) use of aspirin: Secondary | ICD-10-CM | POA: Diagnosis not present

## 2014-12-02 DIAGNOSIS — F329 Major depressive disorder, single episode, unspecified: Secondary | ICD-10-CM | POA: Diagnosis present

## 2014-12-02 DIAGNOSIS — E785 Hyperlipidemia, unspecified: Secondary | ICD-10-CM

## 2014-12-02 DIAGNOSIS — E039 Hypothyroidism, unspecified: Secondary | ICD-10-CM

## 2014-12-02 DIAGNOSIS — I208 Other forms of angina pectoris: Secondary | ICD-10-CM | POA: Diagnosis not present

## 2014-12-02 DIAGNOSIS — I252 Old myocardial infarction: Secondary | ICD-10-CM | POA: Diagnosis not present

## 2014-12-02 DIAGNOSIS — R079 Chest pain, unspecified: Secondary | ICD-10-CM

## 2014-12-02 DIAGNOSIS — G4733 Obstructive sleep apnea (adult) (pediatric): Secondary | ICD-10-CM | POA: Diagnosis present

## 2014-12-02 DIAGNOSIS — Z794 Long term (current) use of insulin: Secondary | ICD-10-CM | POA: Diagnosis not present

## 2014-12-02 DIAGNOSIS — Z9861 Coronary angioplasty status: Secondary | ICD-10-CM

## 2014-12-02 DIAGNOSIS — E669 Obesity, unspecified: Secondary | ICD-10-CM | POA: Diagnosis present

## 2014-12-02 DIAGNOSIS — Z720 Tobacco use: Secondary | ICD-10-CM | POA: Diagnosis not present

## 2014-12-02 DIAGNOSIS — E1159 Type 2 diabetes mellitus with other circulatory complications: Secondary | ICD-10-CM | POA: Diagnosis not present

## 2014-12-02 DIAGNOSIS — I2510 Atherosclerotic heart disease of native coronary artery without angina pectoris: Secondary | ICD-10-CM | POA: Diagnosis not present

## 2014-12-02 DIAGNOSIS — R300 Dysuria: Secondary | ICD-10-CM | POA: Diagnosis present

## 2014-12-02 DIAGNOSIS — F32A Depression, unspecified: Secondary | ICD-10-CM | POA: Diagnosis present

## 2014-12-02 DIAGNOSIS — M1990 Unspecified osteoarthritis, unspecified site: Secondary | ICD-10-CM | POA: Diagnosis present

## 2014-12-02 DIAGNOSIS — F418 Other specified anxiety disorders: Secondary | ICD-10-CM | POA: Diagnosis not present

## 2014-12-02 DIAGNOSIS — F17210 Nicotine dependence, cigarettes, uncomplicated: Secondary | ICD-10-CM | POA: Diagnosis present

## 2014-12-02 DIAGNOSIS — I25118 Atherosclerotic heart disease of native coronary artery with other forms of angina pectoris: Secondary | ICD-10-CM | POA: Diagnosis not present

## 2014-12-02 DIAGNOSIS — Z955 Presence of coronary angioplasty implant and graft: Secondary | ICD-10-CM | POA: Diagnosis not present

## 2014-12-02 DIAGNOSIS — F419 Anxiety disorder, unspecified: Secondary | ICD-10-CM | POA: Diagnosis present

## 2014-12-02 DIAGNOSIS — Z87891 Personal history of nicotine dependence: Secondary | ICD-10-CM | POA: Diagnosis present

## 2014-12-02 DIAGNOSIS — I10 Essential (primary) hypertension: Secondary | ICD-10-CM | POA: Diagnosis not present

## 2014-12-02 DIAGNOSIS — Z6837 Body mass index (BMI) 37.0-37.9, adult: Secondary | ICD-10-CM | POA: Diagnosis not present

## 2014-12-02 DIAGNOSIS — Z880 Allergy status to penicillin: Secondary | ICD-10-CM | POA: Diagnosis not present

## 2014-12-02 DIAGNOSIS — I2582 Chronic total occlusion of coronary artery: Secondary | ICD-10-CM | POA: Diagnosis not present

## 2014-12-02 DIAGNOSIS — I25110 Atherosclerotic heart disease of native coronary artery with unstable angina pectoris: Secondary | ICD-10-CM | POA: Diagnosis not present

## 2014-12-02 DIAGNOSIS — E1165 Type 2 diabetes mellitus with hyperglycemia: Secondary | ICD-10-CM | POA: Diagnosis present

## 2014-12-02 DIAGNOSIS — Z87442 Personal history of urinary calculi: Secondary | ICD-10-CM | POA: Diagnosis not present

## 2014-12-02 DIAGNOSIS — Z7902 Long term (current) use of antithrombotics/antiplatelets: Secondary | ICD-10-CM | POA: Diagnosis not present

## 2014-12-02 DIAGNOSIS — Z8711 Personal history of peptic ulcer disease: Secondary | ICD-10-CM | POA: Diagnosis not present

## 2014-12-02 HISTORY — PX: CARDIAC CATHETERIZATION: SHX172

## 2014-12-02 LAB — COMPREHENSIVE METABOLIC PANEL
ALT: 12 U/L — AB (ref 14–54)
AST: 15 U/L (ref 15–41)
Albumin: 3.6 g/dL (ref 3.5–5.0)
Alkaline Phosphatase: 50 U/L (ref 38–126)
Anion gap: 9 (ref 5–15)
BILIRUBIN TOTAL: 0.4 mg/dL (ref 0.3–1.2)
BUN: 12 mg/dL (ref 6–20)
CO2: 26 mmol/L (ref 22–32)
Calcium: 9.1 mg/dL (ref 8.9–10.3)
Chloride: 104 mmol/L (ref 101–111)
Creatinine, Ser: 0.5 mg/dL (ref 0.44–1.00)
GFR calc Af Amer: >60 mL/min (ref >60)
GFR calc non Af Amer: >60 mL/min (ref >60)
Glucose, Bld: 123 mg/dL — ABNORMAL HIGH (ref 65–99)
POTASSIUM: 3.3 mmol/L — AB (ref 3.5–5.1)
Sodium: 139 mmol/L (ref 135–145)
Total Protein: 6.4 g/dL — ABNORMAL LOW (ref 6.5–8.1)

## 2014-12-02 LAB — CBC
HEMATOCRIT: 35.2 % — AB (ref 36.0–46.0)
HEMATOCRIT: 36 % (ref 36.0–46.0)
HEMOGLOBIN: 11.6 g/dL — AB (ref 12.0–15.0)
Hemoglobin: 11.8 g/dL — ABNORMAL LOW (ref 12.0–15.0)
MCH: 29.6 pg (ref 26.0–34.0)
MCH: 29.6 pg (ref 26.0–34.0)
MCHC: 32.8 g/dL (ref 30.0–36.0)
MCHC: 33 g/dL (ref 30.0–36.0)
MCV: 89.8 fL (ref 78.0–100.0)
MCV: 90.2 fL (ref 78.0–100.0)
Platelets: 200 10*3/uL (ref 150–400)
Platelets: 239 10*3/uL (ref 150–400)
RBC: 3.92 MIL/uL (ref 3.87–5.11)
RBC: 3.99 MIL/uL (ref 3.87–5.11)
RDW: 13.5 % (ref 11.5–15.5)
RDW: 13.5 % (ref 11.5–15.5)
WBC: 7.3 10*3/uL (ref 4.0–10.5)
WBC: 8.3 10*3/uL (ref 4.0–10.5)

## 2014-12-02 LAB — I-STAT TROPONIN, ED: Troponin i, poc: 0.06 ng/mL (ref 0.00–0.08)

## 2014-12-02 LAB — URINALYSIS, ROUTINE W REFLEX MICROSCOPIC
Bilirubin Urine: NEGATIVE
GLUCOSE, UA: NEGATIVE mg/dL
Ketones, ur: NEGATIVE mg/dL
Leukocytes, UA: NEGATIVE
Nitrite: NEGATIVE
PROTEIN: NEGATIVE mg/dL
Specific Gravity, Urine: 1.015 (ref 1.005–1.030)
Urobilinogen, UA: 0.2 mg/dL (ref 0.0–1.0)
pH: 5 (ref 5.0–8.0)

## 2014-12-02 LAB — TROPONIN I
Troponin I: 0.05 ng/mL — ABNORMAL HIGH (ref <0.031)
Troponin I: 0.04 ng/mL — ABNORMAL HIGH (ref <0.031)

## 2014-12-02 LAB — URINE MICROSCOPIC-ADD ON

## 2014-12-02 LAB — BRAIN NATRIURETIC PEPTIDE: B Natriuretic Peptide: 127.8 pg/mL — ABNORMAL HIGH (ref 0.0–100.0)

## 2014-12-02 LAB — BASIC METABOLIC PANEL
Anion gap: 10 (ref 5–15)
BUN: 11 mg/dL (ref 6–20)
CO2: 26 mmol/L (ref 22–32)
Calcium: 9.4 mg/dL (ref 8.9–10.3)
Chloride: 103 mmol/L (ref 101–111)
Creatinine, Ser: 0.5 mg/dL (ref 0.44–1.00)
GFR calc non Af Amer: >60 mL/min (ref >60)
Glucose, Bld: 191 mg/dL — ABNORMAL HIGH (ref 65–99)
Potassium: 3.4 mmol/L — ABNORMAL LOW (ref 3.5–5.1)
Sodium: 139 mmol/L (ref 135–145)

## 2014-12-02 LAB — APTT: APTT: 28 s (ref 24–37)

## 2014-12-02 LAB — PROTIME-INR
INR: 0.94 (ref 0.00–1.49)
PROTHROMBIN TIME: 12.8 s (ref 11.6–15.2)

## 2014-12-02 LAB — POCT ACTIVATED CLOTTING TIME
Activated Clotting Time: 263 seconds
Activated Clotting Time: 288 seconds

## 2014-12-02 LAB — GLUCOSE, CAPILLARY
Glucose-Capillary: 142 mg/dL — ABNORMAL HIGH (ref 65–99)
Glucose-Capillary: 111 mg/dL — ABNORMAL HIGH (ref 65–99)
Glucose-Capillary: 85 mg/dL (ref 65–99)
Glucose-Capillary: 137 mg/dL — ABNORMAL HIGH (ref 65–99)

## 2014-12-02 SURGERY — LEFT HEART CATH AND CORONARY ANGIOGRAPHY
Anesthesia: LOCAL

## 2014-12-02 MED ORDER — LIVING WELL WITH DIABETES BOOK
Freq: Once | Status: AC
  Administered 2014-12-02: 21:00:00
  Filled 2014-12-02: qty 1

## 2014-12-02 MED ORDER — SODIUM CHLORIDE 0.9 % IV SOLN: 250.0000 mL | INTRAVENOUS | Status: DC | PRN

## 2014-12-02 MED ORDER — NITROGLYCERIN 1 MG/10 ML FOR IR/CATH LAB
INTRA_ARTERIAL | Status: AC
  Filled 2014-12-02: qty 10

## 2014-12-02 MED ORDER — HEPARIN SODIUM (PORCINE) 1000 UNIT/ML IJ SOLN
INTRAMUSCULAR | Status: AC
  Filled 2014-12-02: qty 1

## 2014-12-02 MED ORDER — NITROGLYCERIN 0.4 MG SL SUBL: 0.4000 mg | SUBLINGUAL_TABLET | SUBLINGUAL | Status: DC | PRN

## 2014-12-02 MED ORDER — SODIUM CHLORIDE 0.9 % WEIGHT BASED INFUSION
3.0000 mL/kg/h | INTRAVENOUS | Status: AC
  Administered 2014-12-02: 18:00:00 3 mL/kg/h via INTRAVENOUS

## 2014-12-02 MED ORDER — FENTANYL CITRATE (PF) 100 MCG/2ML IJ SOLN
INTRAMUSCULAR | Status: DC | PRN
  Administered 2014-12-02 (×5): 25 ug via INTRAVENOUS

## 2014-12-02 MED ORDER — ATORVASTATIN CALCIUM 80 MG PO TABS
80.0000 mg | ORAL_TABLET | Freq: Every day | ORAL | Status: DC
  Administered 2014-12-02: 80 mg via ORAL
  Filled 2014-12-02 (×2): qty 1

## 2014-12-02 MED ORDER — VERAPAMIL HCL 2.5 MG/ML IV SOLN
INTRAVENOUS | Status: AC
  Filled 2014-12-02: qty 2

## 2014-12-02 MED ORDER — SODIUM CHLORIDE 0.9 % IV SOLN
INTRAVENOUS | Status: AC
  Administered 2014-12-02: 03:00:00 via INTRAVENOUS

## 2014-12-02 MED ORDER — SODIUM CHLORIDE 0.9 % IJ SOLN: 3.0000 mL | Freq: Two times a day (BID) | INTRAMUSCULAR | Status: DC

## 2014-12-02 MED ORDER — MIDAZOLAM HCL 2 MG/2ML IJ SOLN
INTRAMUSCULAR | Status: AC
  Filled 2014-12-02: qty 2

## 2014-12-02 MED ORDER — HEPARIN (PORCINE) IN NACL 2-0.9 UNIT/ML-% IJ SOLN
INTRAMUSCULAR | Status: AC
  Filled 2014-12-02: qty 1000

## 2014-12-02 MED ORDER — TICAGRELOR 90 MG PO TABS
90.0000 mg | ORAL_TABLET | Freq: Two times a day (BID) | ORAL | Status: DC
  Administered 2014-12-02 – 2014-12-03 (×3): 90 mg via ORAL
  Filled 2014-12-02 (×4): qty 1

## 2014-12-02 MED ORDER — CARVEDILOL 3.125 MG PO TABS
3.1250 mg | ORAL_TABLET | Freq: Two times a day (BID) | ORAL | Status: DC
  Administered 2014-12-02 – 2014-12-03 (×3): 3.125 mg via ORAL
  Filled 2014-12-02 (×5): qty 1

## 2014-12-02 MED ORDER — SODIUM CHLORIDE 0.9 % IJ SOLN: 3.0000 mL | INTRAMUSCULAR | Status: DC | PRN

## 2014-12-02 MED ORDER — ALUM & MAG HYDROXIDE-SIMETH 200-200-20 MG/5ML PO SUSP: 30.0000 mL | Freq: Four times a day (QID) | ORAL | Status: DC | PRN

## 2014-12-02 MED ORDER — MORPHINE SULFATE 2 MG/ML IJ SOLN
2.0000 mg | INTRAMUSCULAR | Status: DC | PRN
  Administered 2014-12-02 (×5): 2 mg via INTRAVENOUS
  Filled 2014-12-02 (×5): qty 1

## 2014-12-02 MED ORDER — HEPARIN SODIUM (PORCINE) 5000 UNIT/ML IJ SOLN
5000.0000 [IU] | Freq: Three times a day (TID) | INTRAMUSCULAR | Status: DC
  Administered 2014-12-02 – 2014-12-03 (×3): 5000 [IU] via SUBCUTANEOUS
  Filled 2014-12-02 (×4): qty 1

## 2014-12-02 MED ORDER — LISINOPRIL 10 MG PO TABS
10.0000 mg | ORAL_TABLET | Freq: Every day | ORAL | Status: DC
  Administered 2014-12-02 – 2014-12-03 (×2): 10 mg via ORAL
  Filled 2014-12-02 (×2): qty 1

## 2014-12-02 MED ORDER — SODIUM CHLORIDE 0.9 % IV SOLN: INTRAVENOUS | Status: DC

## 2014-12-02 MED ORDER — ASPIRIN EC 81 MG PO TBEC
81.0000 mg | DELAYED_RELEASE_TABLET | Freq: Every day | ORAL | Status: DC
  Administered 2014-12-02: 81 mg via ORAL
  Filled 2014-12-02: qty 1

## 2014-12-02 MED ORDER — IOHEXOL 350 MG/ML SOLN
INTRAVENOUS | Status: DC | PRN
  Administered 2014-12-02: 120 mL via INTRA_ARTERIAL

## 2014-12-02 MED ORDER — NICOTINE 21 MG/24HR TD PT24
21.0000 mg | MEDICATED_PATCH | Freq: Every day | TRANSDERMAL | Status: DC
  Administered 2014-12-02 – 2014-12-03 (×2): 21 mg via TRANSDERMAL
  Filled 2014-12-02 (×2): qty 1

## 2014-12-02 MED ORDER — FENTANYL CITRATE (PF) 100 MCG/2ML IJ SOLN
INTRAMUSCULAR | Status: AC
  Filled 2014-12-02: qty 2

## 2014-12-02 MED ORDER — ACETAMINOPHEN 325 MG PO TABS: 650.0000 mg | ORAL_TABLET | ORAL | Status: DC | PRN

## 2014-12-02 MED ORDER — NITROGLYCERIN 1 MG/10 ML FOR IR/CATH LAB
INTRA_ARTERIAL | Status: DC | PRN
  Administered 2014-12-02 (×3): 200 ug via INTRACORONARY

## 2014-12-02 MED ORDER — VERAPAMIL HCL 2.5 MG/ML IV SOLN
INTRAVENOUS | Status: DC | PRN
  Administered 2014-12-02: 17:00:00 via INTRA_ARTERIAL

## 2014-12-02 MED ORDER — ONDANSETRON HCL 4 MG/2ML IJ SOLN: 4.0000 mg | Freq: Four times a day (QID) | INTRAMUSCULAR | Status: DC | PRN

## 2014-12-02 MED ORDER — ASPIRIN 81 MG PO CHEW: 81.0000 mg | CHEWABLE_TABLET | ORAL | Status: DC

## 2014-12-02 MED ORDER — ACETAMINOPHEN 325 MG PO TABS: 650.0000 mg | ORAL_TABLET | Freq: Four times a day (QID) | ORAL | Status: DC | PRN

## 2014-12-02 MED ORDER — POTASSIUM CHLORIDE CRYS ER 20 MEQ PO TBCR
20.0000 meq | EXTENDED_RELEASE_TABLET | Freq: Once | ORAL | Status: AC
  Administered 2014-12-02: 20 meq via ORAL

## 2014-12-02 MED ORDER — MIDAZOLAM HCL 2 MG/2ML IJ SOLN
INTRAMUSCULAR | Status: DC | PRN
  Administered 2014-12-02: 2 mg via INTRAVENOUS
  Administered 2014-12-02: 1 mg via INTRAVENOUS

## 2014-12-02 MED ORDER — POTASSIUM CHLORIDE CRYS ER 20 MEQ PO TBCR
20.0000 meq | EXTENDED_RELEASE_TABLET | Freq: Once | ORAL | Status: AC
  Administered 2014-12-02: 20 meq via ORAL
  Filled 2014-12-02: qty 1

## 2014-12-02 MED ORDER — IBUPROFEN 200 MG PO TABS: 800.0000 mg | ORAL_TABLET | Freq: Three times a day (TID) | ORAL | Status: DC | PRN

## 2014-12-02 MED ORDER — LIDOCAINE HCL (PF) 1 % IJ SOLN
INTRAMUSCULAR | Status: AC
  Filled 2014-12-02: qty 30

## 2014-12-02 MED ORDER — ACETAMINOPHEN 650 MG RE SUPP: 650.0000 mg | Freq: Four times a day (QID) | RECTAL | Status: DC | PRN

## 2014-12-02 MED ORDER — INSULIN ASPART 100 UNIT/ML ~~LOC~~ SOLN: 0.0000 [IU] | Freq: Three times a day (TID) | SUBCUTANEOUS | Status: DC

## 2014-12-02 MED ORDER — INSULIN DETEMIR 100 UNIT/ML ~~LOC~~ SOLN
22.0000 [IU] | Freq: Every day | SUBCUTANEOUS | Status: DC
  Administered 2014-12-02: 22:00:00 22 [IU] via SUBCUTANEOUS
  Filled 2014-12-02 (×2): qty 0.22

## 2014-12-02 MED ORDER — ASPIRIN 81 MG PO CHEW
324.0000 mg | CHEWABLE_TABLET | Freq: Once | ORAL | Status: AC
  Administered 2014-12-02: 324 mg via ORAL
  Filled 2014-12-02: qty 4

## 2014-12-02 MED ORDER — INSULIN DETEMIR 100 UNIT/ML FLEXPEN: 22.0000 [IU] | PEN_INJECTOR | Freq: Every day | SUBCUTANEOUS | Status: DC

## 2014-12-02 MED ORDER — HEPARIN SODIUM (PORCINE) 1000 UNIT/ML IJ SOLN
INTRAMUSCULAR | Status: DC | PRN
  Administered 2014-12-02: 3000 [IU] via INTRAVENOUS
  Administered 2014-12-02: 2000 [IU] via INTRAVENOUS
  Administered 2014-12-02: 5000 [IU] via INTRAVENOUS

## 2014-12-02 MED ORDER — ONDANSETRON HCL 4 MG PO TABS: 4.0000 mg | ORAL_TABLET | Freq: Four times a day (QID) | ORAL | Status: DC | PRN

## 2014-12-02 MED ORDER — LEVOTHYROXINE SODIUM 100 MCG PO TABS
200.0000 ug | ORAL_TABLET | Freq: Every day | ORAL | Status: DC
  Administered 2014-12-02 – 2014-12-03 (×2): 200 ug via ORAL
  Filled 2014-12-02 (×2): qty 1
  Filled 2014-12-02: qty 2

## 2014-12-02 MED ORDER — ANGIOPLASTY BOOK
Freq: Once | Status: AC
  Administered 2014-12-02: 21:00:00
  Filled 2014-12-02: qty 1

## 2014-12-02 MED ORDER — NITROGLYCERIN 0.2 MG/HR TD PT24
0.2000 mg | MEDICATED_PATCH | Freq: Every day | TRANSDERMAL | Status: DC
  Administered 2014-12-02 – 2014-12-03 (×2): 0.2 mg via TRANSDERMAL
  Filled 2014-12-02 (×2): qty 1

## 2014-12-02 SURGICAL SUPPLY — 21 items
BALLN EMERGE MR 2.5X15 (BALLOONS) ×2
BALLN ~~LOC~~ EMERGE MR 3.25X15 (BALLOONS) ×2
BALLOON EMERGE MR 2.5X15 (BALLOONS) ×1 IMPLANT
BALLOON ~~LOC~~ EMERGE MR 3.25X15 (BALLOONS) ×1 IMPLANT
CATH INFINITI 5FR ANG PIGTAIL (CATHETERS) IMPLANT
CATH INFINITI 5FR MULTPACK ANG (CATHETERS) IMPLANT
CATH OPTITORQUE TIG 4.0 5F (CATHETERS) ×2 IMPLANT
CATH VISTA GUIDE 6FR XBLAD3.5 (CATHETERS) ×2 IMPLANT
DEVICE RAD COMP TR BAND LRG (VASCULAR PRODUCTS) ×2 IMPLANT
GLIDESHEATH SLEND A-KIT 6F 22G (SHEATH) ×2 IMPLANT
KIT ENCORE 26 ADVANTAGE (KITS) ×2 IMPLANT
KIT HEART LEFT (KITS) ×2 IMPLANT
PACK CARDIAC CATHETERIZATION (CUSTOM PROCEDURE TRAY) ×2 IMPLANT
SHEATH PINNACLE 5F 10CM (SHEATH) IMPLANT
STENT PROMUS PREM MR 3.0X20 (Permanent Stent) ×2 IMPLANT
SYR MEDRAD MARK V 150ML (SYRINGE) ×2 IMPLANT
TRANSDUCER W/STOPCOCK (MISCELLANEOUS) ×2 IMPLANT
TUBING CIL FLEX 10 FLL-RA (TUBING) ×2 IMPLANT
WIRE EMERALD 3MM-J .035X150CM (WIRE) IMPLANT
WIRE HI TORQ BMW 190CM (WIRE) ×2 IMPLANT
WIRE SAFE-T 1.5MM-J .035X260CM (WIRE) ×2 IMPLANT

## 2014-12-02 NOTE — ED Provider Notes (Signed)
CSN: [id number]     Arrival date & time 12/01/14  2327 History  This chart was scribed for Jacqueline Rice, MD by Evelene Croon, ED Scribe. This patient was seen in room D32C/D32C and the patient's care was started 12:52 AM.    Chief Complaint  Patient presents with  . Chest Pain    The history is provided by the patient. No language interpreter was used.     HPI Comments:  Jacqueline Munoz is a 54 y.o. female with a PMHx of MI and anxiety who presents to the Emergency Department complaining of intermittent CP that started yesterday (12/01/14) afternoon. She describes her pain as a tightness and notes the first episode occurred ~1600 when going up the stairs; the episode resolved after a few minutes of resting. She states the pain returned ~2100 also after exertion and lasted longer than the first episode. The pain with this episode radiated to her left chest and down her LUE. Pt notes her pain today feels similar to past MI but notes she has been experiencing the same pain since her MI but pain today is worse. She reports associated mild SOB upon waking numbness to her left hand and dizziness .  She was seen in the ED on 5/17 for CP and admitted. She had coronary stents placed on 11/16/14 by Dr. Ellyn Hack. Pt is currently on a blood thinner and takes 81 mg ASA daily and is complaint.   Past Medical History  Diagnosis Date  . Fibromyalgia   . Tobacco abuse   . Hypothyroid   . Family history of adverse reaction to anesthesia     "mother died twice during surgery; they brought her back both times" (11/15/2014)  . Sleep apnea     "dx'd; don't wear a mask" (11/15/2014)  . History of stomach ulcers 1981  . Migraine     "couple/month" (11/15/2014)  . Headache     "@ least weekly" (11/15/2014)  . Arthritis     "hips, ankles, knees, hands, back" (11/15/2014)  . Chronic back pain     "all over"  . Anxiety     "social"  . Chronic depression   . Kidney stones     "I've had several"  . Poorly controlled  type 2 diabetes mellitus with circulatory disorder 11/15/2014  . Essential hypertension 11/15/2014  . Hyperlipidemia associated with type 2 diabetes mellitus 11/15/2014  . NSTEMI (non-ST elevated myocardial infarction) 11/15/2014  . CAD S/P percutaneous coronary angioplasty 11/16/2014    Mid LAD to Dist LAD lesion, 90% -->0% Post PCI - 2 overlap DES ( Promus Premier DES 3.0 x 16 - 2.75  x 16), Mid Cx to Dist Cx lesion, 95%--> 0% post DES PCI (Synergy DES 2.25 x 38 -mini-crushed @ Ostial OM1 , Ostial-prox OM1 90% -> 0% post DES PCI (Synergy DES 2.75 x 24 -> 3.0 mm) - bifurcation minicrush of mCx stent  . Coronary artery chronic total occlusion: 100% RCA CTO 11/16/2014    Prox RCA to Mid RCA lesion, 100% stenosed.   . Type IVa MI - Peri-PCI 11/17/2014    Prolonged ischemia during bifurcation PCI of  m-d Cx-OM1 with minicrush technique   Past Surgical History  Procedure Laterality Date  . Cesarean section  2000  . Inner ear surgery Bilateral "several"  . Tonsillectomy    . Total abdominal hysterectomy  ~ 2008  . Tubal ligation  2000  . Cardiac catheterization N/A 11/16/2014    Procedure: Left Heart Cath and Coronary Angiography;  Surgeon: Peter M Martinique, MD;  Location: [place] [facility];  Service: Cardiovascular;  Laterality: N/A;  . Cardiac catheterization  11/16/2014    Procedure: Coronary Stent Intervention;  Surgeon: Peter M Martinique, MD;  Location: [facility];  Service: Cardiovascular;;  . Cardiac catheterization N/A 12/02/2014    Procedure: Left Heart Cath and Coronary Angiography;  Surgeon: Leonie Man, MD;  Location: [place] [facility];  Service: Cardiovascular;  Laterality: N/A;  . Cardiac catheterization N/A 12/02/2014    Procedure: Coronary Stent Intervention;  Surgeon: Leonie Man, MD;  Location: [facility];  Service: Cardiovascular;  Laterality: N/A;   Family History  Problem Relation Age of Onset  . Hyperlipidemia Mother    History  Substance Use Topics  .  Smoking status: Former Smoker -- 0.00 packs/day for 36 years    Types: Cigarettes    Quit date: 11/15/2014  . Smokeless tobacco: Never Used  . Alcohol Use: No     Comment: 11/15/2014 "might have a few drinks/yr"   OB History    No data available     Review of Systems  Constitutional: Negative for fever and chills.  Respiratory: Positive for shortness of breath. Negative for cough.   Cardiovascular: Positive for chest pain. Negative for palpitations and leg swelling.  Gastrointestinal: Negative for nausea and abdominal pain.  Musculoskeletal: Negative for back pain, neck pain and neck stiffness.  Skin: Negative for rash and wound.  Neurological: Positive for dizziness and numbness.  All other systems reviewed and are negative.     Allergies  Corticosteroids and Penicillins  Home Medications   Prior to Admission medications   Medication Sig Start Date End Date Taking? Authorizing Provider  aspirin EC 81 MG EC tablet Take 1 tablet (81 mg total) by mouth daily. 11/18/14  Yes Debbe Odea, MD  atorvastatin (LIPITOR) 80 MG tablet Take 1 tablet (80 mg total) by mouth daily at 6 PM. 11/18/14  Yes Debbe Odea, MD  carvedilol (COREG) 3.125 MG tablet Take 1 tablet (3.125 mg total) by mouth 2 (two) times daily with a meal. 11/18/14  Yes Debbe Odea, MD  glucose monitoring kit (FREESTYLE) monitoring kit 1 each by Does not apply route 4 (four) times daily - after meals and at bedtime. 1 month Diabetic Testing Supplies for QAC-QHS accuchecks. 11/16/14  Yes Debbe Odea, MD  ibuprofen (ADVIL,MOTRIN) 200 MG tablet Take 800 mg by mouth every 8 (eight) hours as needed for mild pain or moderate pain.   Yes Historical Provider, MD  insulin aspart (NOVOLOG FLEXPEN) 100 UNIT/ML FlexPen Inject 2-15 Units into the skin 3 (three) times daily with meals. Correction coverage: Moderate (average weight, post-op)     CBG < 70: implement hypoglycemia protocol    CBG 70 - 120: 0 units    CBG 121 - 150: 2  units    CBG 151 - 200: 3 units    CBG 201 - 250: 5 units    CBG 251 - 300: 8 units    CBG 301 - 350: 11 units    CBG 351 - 400: 15 units    CBG > 400 call MD and obtain STAT lab verification    11/18/14  Yes Debbe Odea, MD  levothyroxine (SYNTHROID, LEVOTHROID) 200 MCG tablet Take 1 tablet (200 mcg total) by mouth daily before breakfast. 11/18/14  Yes Debbe Odea, MD  lisinopril (PRINIVIL,ZESTRIL) 10 MG tablet Take 1 tablet (10 mg total) by mouth daily. 11/18/14  Yes Saima  Rizwan, MD  metFORMIN (GLUCOPHAGE) 500 MG tablet Take 1 tablet (500 mg total) by mouth 2 (two) times daily with a meal. 11/20/14  Yes Saima Rizwan, MD  NEEDLE, DISP, 30 G (BD DISP NEEDLES) 30G X 1/2" MISC 1 each by Does not apply route 3 (three) times daily before meals. 11/23/14  Yes Arnoldo Morale, MD  nicotine (NICODERM CQ - DOSED IN MG/24 HOURS) 21 mg/24hr patch Place 21 mg onto the skin daily.   Yes Historical Provider, MD  nitroGLYCERIN (NITROSTAT) 0.4 MG SL tablet Place 1 tablet (0.4 mg total) under the tongue every 5 (five) minutes as needed for chest pain. 11/23/14  Yes Arnoldo Morale, MD  ticagrelor (BRILINTA) 90 MG TABS tablet Take 1 tablet (90 mg total) by mouth 2 (two) times daily. 11/18/14  Yes Debbe Odea, MD  acetaminophen-codeine (TYLENOL #3) 300-30 MG per tablet Take 1-2 tablets by mouth every 4 (four) hours as needed for moderate pain. 12/07/14   Arnoldo Morale, MD  furosemide (LASIX) 20 MG tablet Take 0.5 tablets (10 mg total) by mouth daily. Take for 3 days for swelling. Patient not taking: Reported on 12/02/2014 11/25/14   Minus Breeding, MD  Insulin Detemir (LEVEMIR FLEXPEN) 100 UNIT/ML Pen Inject 35 Units into the skin at bedtime. 12/07/14   Arnoldo Morale, MD  oxyCODONE-acetaminophen (ROXICET) 5-325 MG per tablet Take 1 tablet by mouth every 6 (six) hours as needed for severe pain. 12/03/14   Costin Karlyne Greenspan, MD   BP 135/70 mmHg  Pulse 74  Temp(Src) 98.3 F (36.8 C) (Oral)  Resp 18  Ht _0  (1.575 m)   Wt 206 lb 9.1 oz (93.7 kg)  BMI 37.77 kg/m2  SpO2 96% Physical Exam  Constitutional: She is oriented to person, place, and time. She appears well-developed and well-nourished. No distress.  HENT:  Head: Normocephalic and atraumatic.  Mouth/Throat: Oropharynx is clear and moist.  Eyes: EOM are normal. Pupils are equal, round, and reactive to light.  Neck: Normal range of motion. Neck supple.  Cardiovascular: Normal rate and regular rhythm.   Pulmonary/Chest: Effort normal and breath sounds normal. No respiratory distress. She has no wheezes. She has no rales. She exhibits no tenderness.  Abdominal: Soft. Bowel sounds are normal. She exhibits no distension and no mass. There is no tenderness. There is no rebound and no guarding.  Musculoskeletal: Normal range of motion. She exhibits no edema or tenderness.  No calf swelling or tenderness.  Neurological: She is alert and oriented to person, place, and time.  Skin: Skin is warm and dry. No rash noted. No erythema.  Psychiatric: She has a normal mood and affect. Her behavior is normal.  Nursing note and vitals reviewed.   ED Course  Procedures   DIAGNOSTIC STUDIES:  Oxygen Saturation is 98% on RA, normal by my interpretation.    COORDINATION OF CARE:  12:59 AM Discussed treatment plan with pt at bedside and pt agreed to plan.  2:03 AM Discussed case with cardiology who will see the patient in emergency department.   Labs Review Labs Reviewed  CBC - Abnormal; Notable for the following:    Hemoglobin 11.8 (*)    All other components within normal limits  BASIC METABOLIC PANEL - Abnormal; Notable for the following:    Potassium 3.4 (*)    Glucose, Bld 191 (*)    All other components within normal limits  BRAIN NATRIURETIC PEPTIDE - Abnormal; Notable for the following:    B Natriuretic Peptide 127.8 (*)  All other components within normal limits  URINALYSIS, ROUTINE W REFLEX MICROSCOPIC (NOT AT [facility]) - Abnormal; Notable for  the following:    Hgb urine dipstick MODERATE (*)    All other components within normal limits  TROPONIN I - Abnormal; Notable for the following:    Troponin I 0.05 (*)    All other components within normal limits  TROPONIN I - Abnormal; Notable for the following:    Troponin I 0.04 (*)    All other components within normal limits  COMPREHENSIVE METABOLIC PANEL - Abnormal; Notable for the following:    Potassium 3.3 (*)    Glucose, Bld 123 (*)    Total Protein 6.4 (*)    ALT 12 (*)    All other components within normal limits  CBC - Abnormal; Notable for the following:    Hemoglobin 11.6 (*)    HCT 35.2 (*)    All other components within normal limits  GLUCOSE, CAPILLARY - Abnormal; Notable for the following:    Glucose-Capillary 142 (*)    All other components within normal limits  GLUCOSE, CAPILLARY - Abnormal; Notable for the following:    Glucose-Capillary 111 (*)    All other components within normal limits  BASIC METABOLIC PANEL - Abnormal; Notable for the following:    Glucose, Bld 122 (*)    Calcium 8.4 (*)    All other components within normal limits  CBC - Abnormal; Notable for the following:    RBC 3.62 (*)    Hemoglobin 10.5 (*)    HCT 32.6 (*)    All other components within normal limits  GLUCOSE, CAPILLARY - Abnormal; Notable for the following:    Glucose-Capillary 137 (*)    All other components within normal limits  URINE MICROSCOPIC-ADD ON - Abnormal; Notable for the following:    Squamous Epithelial / LPF FEW (*)    All other components within normal limits  URINE CULTURE  LDL CHOLESTEROL, DIRECT  PROTIME-INR  APTT  GLUCOSE, CAPILLARY  GLUCOSE, CAPILLARY  GLUCOSE, CAPILLARY  I-STAT TROPOININ, ED  POCT ACTIVATED CLOTTING TIME  POCT ACTIVATED CLOTTING TIME    Imaging Review No results found.   EKG Interpretation   Date/Time:  Thursday December 01 2014 23:33:24 EDT Ventricular Rate:  71 PR Interval:  154 QRS Duration: 78 QT Interval:  388 QTC  Calculation: 421 R Axis:   11 Text Interpretation:  Normal sinus rhythm Low voltage QRS Cannot rule out  Anterior infarct , age undetermined Abnormal ECG Confirmed by Lita Mains   MD, Nabeel Gladson ([id number]) on 12/02/2014 12:43:54 AM      MDM   Final diagnoses:  Chest pain on exertion    I personally performed the services described in this documentation, which was scribed in my presence. The recorded information has been reviewed and is accurate.  Patient with concerning history of unstable angina. Discuss with cardiology.   Jacqueline Rice, MD 12/09/14 [phone]

## 2014-12-02 NOTE — Progress Notes (Signed)
PT Cancellation Note  Patient Details Name: Jacqueline Munoz MRN: [id number] DOB: 02/13/1961   Cancelled Treatment:    Reason Eval/Treat Not Completed: Medical issues which prohibited therapy Pt with minimally elevated troponin. Plan for cardiac cath. Will await until post cardiac cath until initiation of PT evaluation.   Marguarite Arbour A Caileb Rhue 12/02/2014, 1:30 PM\ Wray Kearns, [place], DPT [phone]

## 2014-12-02 NOTE — H&P (Signed)
[facility] History and Physical  Jacqueline Munoz ZDG:[id number] DOB: December 09, 1960 DOA: 12/02/2014  Referring physician: ED physician PCP: Pcp Not In System  Specialists:   Chief Complaint: chest pain  HPI: Jacqueline Munoz is a 54 y.o. female with PMH of hypertension, hyperlipidemia, diabetes mellitus, hypothyroidism, depression, excited, tobacco abuse, OSA, history of peptic ulcer, history of migraine, CAD, post status of stent placement on 11/16/14, who presents with chest pain.  Patient reports that she had 2 episode of chest pain since yesterday afternoon. The first one occurred ~1600 when going up the stairs; the episode resolved after about 25 min of resting. She states the pain returned ~2100 also after exertion and lasted little longer than the first episode. Her chest pain is located in the left chest, 9 out of 10 in severity, radiating to the left arm. Pt notes her pain today feels similar to past MI, but notes she has been experiencing the same pain since her MI but pain today is worse. It was associated with mild shortness of breath, no cough. Her chest pain is not pleuritic, no tenderness over calf areas. Patient also reports having dysuria, but no burning or increased urinary frequency recently. She has tenderness over TMJ joint area.   Currently patient denies fever, chills, running nose, ear pain, headaches, abdominal pain, diarrhea, constipation, hematuria, skin rashes. No unilateral weakness, numbness or tingling sensations. No vision change or hearing loss.  In ED, patient was found to have POC trop 0.06 which is negative, WBC 8.3, temperature 98.3, potassium is 3.4, chest x-ray negative. EKG showed low voltage. Patient is admitted to inpatient for further evaluation and treatment.  Where does patient live?   At home    Can patient participate in ADLs?  Yes    Review of Systems:   General: no fevers, chills, no changes in body weight, has fatigue HEENT: no blurry vision,  hearing changes or sore throat Pulm: has dyspnea, no coughing, wheezing CV: has chest pain, no palpitations Abd: no nausea, vomiting, abdominal pain, diarrhea, constipation GU: has dysuria, no burning on urination, increased urinary frequency, hematuria  Ext: no leg edema Neuro: no unilateral weakness, numbness, or tingling, no vision change or hearing loss Skin: no rash MSK: No muscle spasm, no deformity, no limitation of range of movement in spin Heme: No easy bruising.  Travel history: No recent long distant travel.  Allergy:  Allergies  Allergen Reactions  . Corticosteroids Anaphylaxis    ALL steroids.     . Penicillins Anaphylaxis    Past Medical History  Diagnosis Date  . Fibromyalgia   . Tobacco abuse   . Hypothyroid   . Family history of adverse reaction to anesthesia     "mother died twice during surgery; they brought her back both times" (11/15/2014)  . Sleep apnea     "dx'd; don't wear a mask" (11/15/2014)  . History of stomach ulcers 1981  . Migraine     "couple/month" (11/15/2014)  . Headache     "@ least weekly" (11/15/2014)  . Arthritis     "hips, ankles, knees, hands, back" (11/15/2014)  . Chronic back pain     "all over"  . Anxiety     "social"  . Chronic depression   . Kidney stones     "I've had several"  . Poorly controlled type 2 diabetes mellitus with circulatory disorder 11/15/2014  . Essential hypertension 11/15/2014  . Hyperlipidemia associated with type 2 diabetes mellitus 11/15/2014  . NSTEMI (non-ST elevated myocardial infarction)  11/15/2014  . CAD S/P percutaneous coronary angioplasty 11/16/2014    Mid LAD to Dist LAD lesion, 90% -->0% Post PCI - 2 overlap DES ( Promus Premier DES 3.0 x 16 - 2.75  x 16), Mid Cx to Dist Cx lesion, 95%--> 0% post DES PCI (Synergy DES 2.25 x 38 -mini-crushed @ Ostial OM1 , Ostial-prox OM1 90% -> 0% post DES PCI (Synergy DES 2.75 x 24 -> 3.0 mm) - bifurcation minicrush of mCx stent  . Coronary artery chronic total  occlusion: 100% RCA CTO 11/16/2014    Prox RCA to Mid RCA lesion, 100% stenosed.   . Type IVa MI - Peri-PCI 11/17/2014    Prolonged ischemia during bifurcation PCI of  m-d Cx-OM1 with minicrush technique    Past Surgical History  Procedure Laterality Date  . Cesarean section  2000  . Inner ear surgery Bilateral "several"  . Tonsillectomy    . Total abdominal hysterectomy  ~ 2008  . Tubal ligation  2000  . Cardiac catheterization N/A 11/16/2014    Procedure: Left Heart Cath and Coronary Angiography;  Surgeon: Peter M Martinique, MD;  Location: [facility];  Service: Cardiovascular;  Laterality: N/A;  . Cardiac catheterization  11/16/2014    Procedure: Coronary Stent Intervention;  Surgeon: Peter M Martinique, MD;  Location: [facility];  Service: Cardiovascular;;    Social History:  reports that she has been smoking Cigarettes.  She has been smoking about 0.00 packs per day for the past 36 years. She has never used smokeless tobacco. She reports that she uses illicit drugs (Marijuana). She reports that she does not drink alcohol.  Family History:  Family History  Problem Relation Age of Onset  . Hyperlipidemia Mother      Prior to Admission medications   Medication Sig Start Date End Date Taking? Authorizing Provider  aspirin EC 81 MG EC tablet Take 1 tablet (81 mg total) by mouth daily. 11/18/14  Yes Debbe Odea, MD  atorvastatin (LIPITOR) 80 MG tablet Take 1 tablet (80 mg total) by mouth daily at 6 PM. 11/18/14  Yes Debbe Odea, MD  carvedilol (COREG) 3.125 MG tablet Take 1 tablet (3.125 mg total) by mouth 2 (two) times daily with a meal. 11/18/14  Yes Debbe Odea, MD  glucose monitoring kit (FREESTYLE) monitoring kit 1 each by Does not apply route 4 (four) times daily - after meals and at bedtime. 1 month Diabetic Testing Supplies for QAC-QHS accuchecks. 11/16/14  Yes Debbe Odea, MD  ibuprofen (ADVIL,MOTRIN) 200 MG tablet Take 800 mg by mouth every 8 (eight) hours as needed for  mild pain or moderate pain.   Yes Historical Provider, MD  insulin aspart (NOVOLOG FLEXPEN) 100 UNIT/ML FlexPen Inject 2-15 Units into the skin 3 (three) times daily with meals. Correction coverage: Moderate (average weight, post-op)     CBG < 70: implement hypoglycemia protocol    CBG 70 - 120: 0 units    CBG 121 - 150: 2 units    CBG 151 - 200: 3 units    CBG 201 - 250: 5 units    CBG 251 - 300: 8 units    CBG 301 - 350: 11 units    CBG 351 - 400: 15 units    CBG > 400 call MD and obtain STAT lab verification    11/18/14  Yes Debbe Odea, MD  Insulin Detemir (LEVEMIR FLEXPEN) 100 UNIT/ML Pen Inject 35 Units into the skin at bedtime. 11/23/14  Yes Enobong  Jarold Song, MD  levothyroxine (SYNTHROID, LEVOTHROID) 200 MCG tablet Take 1 tablet (200 mcg total) by mouth daily before breakfast. 11/18/14  Yes Debbe Odea, MD  lisinopril (PRINIVIL,ZESTRIL) 10 MG tablet Take 1 tablet (10 mg total) by mouth daily. 11/18/14  Yes Debbe Odea, MD  metFORMIN (GLUCOPHAGE) 500 MG tablet Take 1 tablet (500 mg total) by mouth 2 (two) times daily with a meal. 11/20/14  Yes Saima Rizwan, MD  NEEDLE, DISP, 30 G (BD DISP NEEDLES) 30G X 1/2" MISC 1 each by Does not apply route 3 (three) times daily before meals. 11/23/14  Yes Arnoldo Morale, MD  nicotine (NICODERM CQ - DOSED IN MG/24 HOURS) 21 mg/24hr patch Place 21 mg onto the skin daily.   Yes Historical Provider, MD  nitroGLYCERIN (NITROSTAT) 0.4 MG SL tablet Place 1 tablet (0.4 mg total) under the tongue every 5 (five) minutes as needed for chest pain. 11/23/14  Yes Arnoldo Morale, MD  ticagrelor (BRILINTA) 90 MG TABS tablet Take 1 tablet (90 mg total) by mouth 2 (two) times daily. 11/18/14  Yes Debbe Odea, MD  acetaminophen-codeine (TYLENOL #3) 300-30 MG per tablet Take 1-2 tablets by mouth every 4 (four) hours as needed for moderate pain. Patient not taking: Reported on 12/02/2014 11/18/14   Debbe Odea, MD  furosemide (LASIX) 20 MG tablet Take 0.5 tablets  (10 mg total) by mouth daily. Take for 3 days for swelling. Patient not taking: Reported on 12/02/2014 11/25/14   Minus Breeding, MD    Physical Exam: Filed Vitals:   12/02/14 0215 12/02/14 0245 12/02/14 0319 12/02/14 0325  BP: 140/66 113/64 154/86 154/86  Pulse: 60 65  71  Temp:    98 F (36.7 C)  TempSrc:    Oral  Resp: _0 Height:    _1  (1.575 m)  Weight:    92.9 kg (204 lb 12.9 oz)  SpO2: 100% 100%  100%   General: Not in acute distress HEENT:       Eyes: PERRL, EOMI, no scleral icterus.       ENT: No discharge from the ears and nose, no pharynx injection, no tonsillar enlargement.        Neck: No JVD, no bruit, no mass felt. Heme: No neck lymph node enlargement. Cardiac: S1/S2, RRR, No murmurs, No gallops or rubs. Pulm: No rales, wheezing, rhonchi or rubs. Abd: Soft, nondistended, nontender, no rebound pain, no organomegaly, BS present. Ext: No pitting leg edema bilaterally. 2+DP/PT pulse bilaterally. Musculoskeletal: No joint deformities, No joint redness or warmth, no limitation of ROM in spin. Skin: No rashes.  Neuro: Alert, oriented X3, cranial nerves II-XII grossly intact, muscle strength 5/5 in all extremities, sensation to light touch intact.  Psych: Patient is not psychotic, no suicidal or hemocidal ideation.  Labs on Admission:  Basic Metabolic Panel:  Recent Labs Lab 12/01/14 2337  NA 139  K 3.4*  CL 103  CO2 26  GLUCOSE 191*  BUN 11  CREATININE 0.50  CALCIUM 9.4   Liver Function Tests: No results for input(s): AST, ALT, ALKPHOS, BILITOT, PROT, ALBUMIN in the last 168 hours. No results for input(s): LIPASE, AMYLASE in the last 168 hours. No results for input(s): AMMONIA in the last 168 hours. CBC:  Recent Labs Lab 12/01/14 2337 12/02/14 0450  WBC 8.3 7.3  HGB 11.8* 11.6*  HCT 36.0 35.2*  MCV 90.2 89.8  PLT 239 200   Cardiac Enzymes: No results for input(s): CKTOTAL, CKMB, CKMBINDEX, TROPONINI in the last  168 hours.  BNP (last 3  results)  Recent Labs  12/01/14 2337  BNP 127.8*    ProBNP (last 3 results) No results for input(s): PROBNP in the last 8760 hours.  CBG: No results for input(s): GLUCAP in the last 168 hours.  Radiological Exams on Admission: Dg Chest 2 View  12/02/2014   CLINICAL DATA:  Acute onset of left-sided chest pain and shortness of breath. Initial encounter.  EXAM: CHEST  2 VIEW  COMPARISON:  None.  FINDINGS: The lungs are well-aerated. Minimal left basilar atelectasis is noted. There is no evidence of pleural effusion or pneumothorax.  The heart is normal in size; the mediastinal contour is within normal limits. No acute osseous abnormalities are seen.  IMPRESSION: Minimal left basilar atelectasis noted.  Lungs otherwise clear.   Electronically Signed   By: Garald Balding M.D.   On: 12/02/2014 00:31    EKG: Independently reviewed.  Abnormal findings: low voltage.   Assessment/Plan Principal Problem:   Chest pain Active Problems:   Poorly controlled type 2 diabetes mellitus with circulatory disorder   Hyperlipidemia associated with type 2 diabetes mellitus   Essential hypertension   Hypothyroid   NSTEMI (non-ST elevated myocardial infarction)   CAD S/P percutaneous coronary angioplasty   Cigarette smoker   Anxiety and depression   Hypokalemia  Chest pain and CAD: recent hx of NSTEMI, s/p of stent placement on 11/16/14. On ASA and Brilinlita, now presents with exertional chest pain, need rule out ACS. Initial troponin negative, EKG showed diffuse low voltage. No pneumonia on chest x-ray.  - will admit to Tele bed  - cycle CE q6 x3 and repeat her EKG in the am  - Nitroglycerin, Morphine, and aspirin, lipitor, coreg and brilinta - 2d echo  Dysuria: -check UA and urine culture  DM-II: Last A1c 12.5 on 11/15/14, poorly controlled. Patient is taking metformin and Levemir 35 units daily at home -will decrease Levemir dose from 35 to 22 units daily -SSI  HLD: Last LDL was not to be  able to calculated on previous lipid profile -Continue home medications: Lipitor -Check direct LDL  Hypothyroidism: Last TSH was 59.363 on 11/15/58. His Synthroid dose was increased from 175-200 g daily. -Continue home Synthroid  Tobacco abuse: -Did counseling about importance of quitting smoking -Nicotine patch  Hypokalemia: K= 3.4 on admission. - Repleted  Essential hypertension: -Coreg, and lisinopril  DVT ppx: SQ Heparin       Code Status: Full code Family Communication: None at bed side.  Disposition Plan: Admit to inpatient   Date of Service 12/02/2014    Ivor Costa [facility] Pager [phone]  If 7PM-7AM, please contact night-coverage [email or web address] Password [email or web address] 12/02/2014, 6:18 AM

## 2014-12-02 NOTE — Progress Notes (Signed)
  Echocardiogram 2D Echocardiogram has been performed.  Jacqueline Munoz Jacqueline Munoz 12/02/2014, 3:20 PM

## 2014-12-02 NOTE — Progress Notes (Signed)
RT entered room to place patient on CPAP and patient is refusing. RT informed patient if she changes her mind to have RN contact RT and we would bring her one up.

## 2014-12-02 NOTE — Progress Notes (Signed)
TR BAND REMOVAL  LOCATION:    right radial  DEFLATED PER PROTOCOL:    Yes.    TIME BAND OFF / DRESSING APPLIED:    21:45   SITE UPON ARRIVAL:    Level 0  SITE AFTER BAND REMOVAL:    Level 0  REVERSE ALLEN'S TEST:     positive  CIRCULATION SENSATION AND MOVEMENT:    Within Normal Limits   Yes.    COMMENTS:

## 2014-12-02 NOTE — Consult Note (Addendum)
Name: Jacqueline Munoz is a 54 y.o. female Admit date: 12/02/2014 Referring Physician:  C. Cruzita Lederer, MD Primary Physician:  Keane Police, MD Primary Cardiologist:  Glenetta Hew , MD  Reason for Consultation:  Class 3 angina   ASSESSMENT: 1. Class 3 angina pectoris / Unstable Angina. 2. Native multivessel CAD with Circumflex bifurcation crush and overlapping mid LAD DES stents, 11/16/14. Also CTO RCA with collaterals from circumflex. 3. DM type 2 with circulatory complications 4. Hypertention with poor control 5. Obesity 6. Hyperlipidemia 7. Tobacco 8. Hypokalemia  PLAN: 1. Coronary angio and possible PCI recommended to check stent patency and r/o plaque rupture in native circulation. Discussed cath and possible PCI with the patient including risk of stroke, death, MI, CABG, limb ischemia, bleeding and kidney injury. The patient agrees to this approach and is willing to proceed.. 2. Further titrate med therapy for better BP control. 3. Add LA nitrate at low dose initially since sl NTG causes severe HA.   HPI: 54 yo with CTO RCA, overlapping LAD and Crush mid circumflex bifurcation crush DES on 11/16/14. Had NSTEMI on 11/15/14 and did well post stenting until returned to [facility], when she began noticing chest pressure with exertion. This is new and different than presenting complaint in May.Over past 72 hours she gets severe chest tightness with climbing one flight of stairs and in ER experienced chest pressure with the anxiety of having an IV started. Minimal troponin elevation may be residual from recent ACS or related to current presentation. Currently pain free.  PMH:   Past Medical History  Diagnosis Date  . Fibromyalgia   . Tobacco abuse   . Hypothyroid   . Family history of adverse reaction to anesthesia     "mother died twice during surgery; they brought her back both times" (11/15/2014)  . Sleep apnea     "dx'd; don't wear a mask" (11/15/2014)  . History of stomach ulcers  1981  . Migraine     "couple/month" (11/15/2014)  . Headache     "@ least weekly" (11/15/2014)  . Arthritis     "hips, ankles, knees, hands, back" (11/15/2014)  . Chronic back pain     "all over"  . Anxiety     "social"  . Chronic depression   . Kidney stones     "I've had several"  . Poorly controlled type 2 diabetes mellitus with circulatory disorder 11/15/2014  . Essential hypertension 11/15/2014  . Hyperlipidemia associated with type 2 diabetes mellitus 11/15/2014  . NSTEMI (non-ST elevated myocardial infarction) 11/15/2014  . CAD S/P percutaneous coronary angioplasty 11/16/2014    Mid LAD to Dist LAD lesion, 90% -->0% Post PCI - 2 overlap DES ( Promus Premier DES 3.0 x 16 - 2.75  x 16), Mid Cx to Dist Cx lesion, 95%--> 0% post DES PCI (Synergy DES 2.25 x 38 -mini-crushed @ Ostial OM1 , Ostial-prox OM1 90% -> 0% post DES PCI (Synergy DES 2.75 x 24 -> 3.0 mm) - bifurcation minicrush of mCx stent  . Coronary artery chronic total occlusion: 100% RCA CTO 11/16/2014    Prox RCA to Mid RCA lesion, 100% stenosed.   . Type IVa MI - Peri-PCI 11/17/2014    Prolonged ischemia during bifurcation PCI of  m-d Cx-OM1 with minicrush technique    PSH:   Past Surgical History  Procedure Laterality Date  . Cesarean section  2000  . Inner ear surgery Bilateral "several"  . Tonsillectomy    . Total abdominal hysterectomy  ~ 2008  .  Tubal ligation  2000  . Cardiac catheterization N/A 11/16/2014    Procedure: Left Heart Cath and Coronary Angiography;  Surgeon: Peter M Martinique, MD;  Location: [facility];  Service: Cardiovascular;  Laterality: N/A;  . Cardiac catheterization  11/16/2014    Procedure: Coronary Stent Intervention;  Surgeon: Peter M Martinique, MD;  Location: [facility];  Service: Cardiovascular;;   Allergies:  Corticosteroids and Penicillins Prior to Admit Meds:   Prescriptions prior to admission  Medication Sig Dispense Refill Last Dose  . aspirin EC 81 MG EC tablet Take 1 tablet  (81 mg total) by mouth daily. 30 tablet 0 12/02/2014 at Unknown time  . atorvastatin (LIPITOR) 80 MG tablet Take 1 tablet (80 mg total) by mouth daily at 6 PM. 30 tablet 0 12/01/2014 at Unknown time  . carvedilol (COREG) 3.125 MG tablet Take 1 tablet (3.125 mg total) by mouth 2 (two) times daily with a meal. 60 tablet 0 12/01/2014 at 2200  . glucose monitoring kit (FREESTYLE) monitoring kit 1 each by Does not apply route 4 (four) times daily - after meals and at bedtime. 1 month Diabetic Testing Supplies for QAC-QHS accuchecks. 1 each 1 12/01/2014 at Unknown time  . ibuprofen (ADVIL,MOTRIN) 200 MG tablet Take 800 mg by mouth every 8 (eight) hours as needed for mild pain or moderate pain.   12/01/2014 at Unknown time  . insulin aspart (NOVOLOG FLEXPEN) 100 UNIT/ML FlexPen Inject 2-15 Units into the skin 3 (three) times daily with meals. Correction coverage: Moderate (average weight, post-op)     CBG < 70: implement hypoglycemia protocol    CBG 70 - 120: 0 units    CBG 121 - 150: 2 units    CBG 151 - 200: 3 units    CBG 201 - 250: 5 units    CBG 251 - 300: 8 units    CBG 301 - 350: 11 units    CBG 351 - 400: 15 units    CBG > 400 call MD and obtain STAT lab verification    15 mL 11 12/01/2014 at Unknown time  . Insulin Detemir (LEVEMIR FLEXPEN) 100 UNIT/ML Pen Inject 35 Units into the skin at bedtime. 15 mL 11 12/01/2014 at Unknown time  . levothyroxine (SYNTHROID, LEVOTHROID) 200 MCG tablet Take 1 tablet (200 mcg total) by mouth daily before breakfast. 30 tablet 0 12/01/2014 at Unknown time  . lisinopril (PRINIVIL,ZESTRIL) 10 MG tablet Take 1 tablet (10 mg total) by mouth daily. 30 tablet 0 12/01/2014 at Unknown time  . metFORMIN (GLUCOPHAGE) 500 MG tablet Take 1 tablet (500 mg total) by mouth 2 (two) times daily with a meal. 60 tablet 0 12/01/2014 at Unknown time  . NEEDLE, DISP, 30 G (BD DISP NEEDLES) 30G X 1/2" MISC 1 each by Does not apply route 3 (three) times daily before meals. 100 each 3  12/01/2014 at Unknown time  . nicotine (NICODERM CQ - DOSED IN MG/24 HOURS) 21 mg/24hr patch Place 21 mg onto the skin daily.   11/30/2014  . nitroGLYCERIN (NITROSTAT) 0.4 MG SL tablet Place 1 tablet (0.4 mg total) under the tongue every 5 (five) minutes as needed for chest pain. 50 tablet 3 unkn  . ticagrelor (BRILINTA) 90 MG TABS tablet Take 1 tablet (90 mg total) by mouth 2 (two) times daily. 60 tablet 0 12/01/2014 at Unknown time  . acetaminophen-codeine (TYLENOL #3) 300-30 MG per tablet Take 1-2 tablets by mouth every 4 (four) hours as needed for moderate  pain. (Patient not taking: Reported on 12/02/2014) 30 tablet 0 Not Taking at Unknown time  . furosemide (LASIX) 20 MG tablet Take 0.5 tablets (10 mg total) by mouth daily. Take for 3 days for swelling. (Patient not taking: Reported on 12/02/2014) 4 tablet 0 Not Taking at Unknown time   Fam HX:    Family History  Problem Relation Age of Onset  . Hyperlipidemia Mother    Social HX:    History   Social History  . Marital Status: Widowed    Spouse Name: N/A  . Number of Children: N/A  . Years of Education: N/A   Occupational History  . Not on file.   Social History Main Topics  . Smoking status: Current Every Day Smoker -- 0.00 packs/day for 36 years    Types: Cigarettes  . Smokeless tobacco: Never Used  . Alcohol Use: No     Comment: 11/15/2014 "might have a few drinks/yr"  . Drug Use: Yes    Special: Marijuana     Comment: "stopped all drugs in 1987"  . Sexual Activity: Not on file   Other Topics Concern  . Not on file   Social History Narrative     Review of Systems: No smoking since discharge in may. Advocates medication compliance. Left upper quadrant and left lateral abdominal discomfort but no tenderness.  Physical Exam: Blood pressure 154/86, pulse 71, temperature 98 F (36.7 C), temperature source Oral, resp. rate 20, height $RemoveBe'5\' 2"'QUyrxSkSC$  (1.575 m), weight 204 lb 12.9 oz (92.9 kg), SpO2 100 %. Weight change:    Moderate  obesity with anxiety and normal skin color HEENT exam reveals no jaundice. No pallor is noted. Neck veins are not elevated. Neck exam reveals no JVD or carotid bruits. Carotids are 2+ and symmetric. Chest is clear to auscultation and percussion. Cardiac exam reveals no murmur or gallop. No pericardial rub is heard. Extremities reveal no edema. Right radial and left radial pulses are present. Right radial is 1-2+, left radial is 2-3+. Right radial is a site of prior cath. Dorsalis pedis pulses are 2+ bilaterally. Abdomen is obese. No focal tenderness is noted. Neurological exam reveals no focal motor or sensory deficits.  Labs: Lab Results  Component Value Date   WBC 7.3 12/02/2014   HGB 11.6* 12/02/2014   HCT 35.2* 12/02/2014   MCV 89.8 12/02/2014   PLT 200 12/02/2014    Recent Labs Lab 12/02/14 0450  NA 139  K 3.3*  CL 104  CO2 26  BUN 12  CREATININE 0.50  CALCIUM 9.1  PROT 6.4*  BILITOT 0.4  ALKPHOS 50  ALT 12*  AST 15  GLUCOSE 123*   No results found for: PTT Lab Results  Component Value Date   INR 0.94 12/02/2014   INR 0.99 11/15/2014   INR 0.91 11/15/2014   Lab Results  Component Value Date   TROPONINI 0.05* 12/02/2014     Lab Results  Component Value Date   CHOL 281* 11/16/2014   Lab Results  Component Value Date   HDL 33* 11/16/2014   Lab Results  Component Value Date   LDLCALC UNABLE TO CALCULATE IF TRIGLYCERIDE OVER 400 mg/dL 11/16/2014   Lab Results  Component Value Date   TRIG 561* 11/16/2014   Lab Results  Component Value Date   CHOLHDL 8.5 11/16/2014   No results found for: LDLDIRECT    Radiology:  Dg Chest 2 View  12/02/2014   CLINICAL DATA:  Acute onset of left-sided chest pain  and shortness of breath. Initial encounter.  EXAM: CHEST  2 VIEW  COMPARISON:  None.  FINDINGS: The lungs are well-aerated. Minimal left basilar atelectasis is noted. There is no evidence of pleural effusion or pneumothorax.  The heart is normal in size; the  mediastinal contour is within normal limits. No acute osseous abnormalities are seen.  IMPRESSION: Minimal left basilar atelectasis noted.  Lungs otherwise clear.   Electronically Signed   By: Garald Balding M.D.   On: 12/02/2014 00:31    EKG:  Normal sinus rhythm with no acute changes of ischemia noted.    Sinclair Grooms 12/02/2014 10:22 AM

## 2014-12-02 NOTE — Progress Notes (Signed)
Patient refused CPAP for the night  

## 2014-12-02 NOTE — ED Notes (Addendum)
Attempted IV start x2.

## 2014-12-02 NOTE — Interval H&P Note (Signed)
History and Physical Interval Note:  12/02/2014 4:11 PM  Jossilyn Krummel  has presented today for surgery, with the diagnosis of chest pain concerning for angina status post recent 4 stent PCI.   The various methods of treatment have been discussed with the patient and family. After consideration of risks, benefits and other options for treatment, the patient has consented to  Procedure(s): Left Heart Cath and Coronary Angiography (N/A) with possible percutaneous coronary intervention  as a surgical intervention .  The patient's history has been reviewed, patient examined, no change in status, stable for surgery.  I have reviewed the patient's chart and labs.  Questions were answered to the patient's satisfaction.     Cath Lab Visit (complete for each Cath Lab visit)  Clinical Evaluation Leading to the Procedure:   ACS: No.  Non-ACS:    Anginal Classification: CCS III  Anti-ischemic medical therapy: Maximal Therapy (2 or more classes of medications)  Non-Invasive Test Results: No non-invasive testing performed  Prior CABG: No previous CABG   Ischemic Symptoms? CCS III (Marked limitation of ordinary activity) Anti-ischemic Medical Therapy? Maximal Medical Therapy (2 or more classes of medications) Non-invasive Test Results? No non-invasive testing performed Prior CABG? No Previous CABG   Patient Information:   1-2V CAD, no prox LAD  A (7)  Indication: 20; Score: 7   Patient Information:   1-2V-CAD with DS 50-60% With No FFR, No IVUS  I (3)  Indication: 21; Score: 3   Patient Information:   1-2V-CAD with DS 50-60% With FFR  A (7)  Indication: 22; Score: 7   Patient Information:   1-2V-CAD with DS 50-60% With FFR>0.8, IVUS not significant  I (2)  Indication: 23; Score: 2   Patient Information:   3V-CAD without LMCA With Abnormal LV systolic function  A (9)  Indication: 48; Score: 9   Patient Information:   LMCA-CAD  A (9)  Indication: 49; Score:  9   Patient Information:   2V-CAD with prox LAD PCI  A (7)  Indication: 62; Score: 7   Patient Information:   2V-CAD with prox LAD CABG  A (8)  Indication: 62; Score: 8   Patient Information:   3V-CAD without LMCA With Low CAD burden(i.e., 3 focal stenoses, low SYNTAX score) PCI  A (7)  Indication: 63; Score: 7   Patient Information:   3V-CAD without LMCA With Low CAD burden(i.e., 3 focal stenoses, low SYNTAX score) CABG  A (9)  Indication: 63; Score: 9   Patient Information:   3V-CAD without LMCA E06c - Intermediate-high CAD burden (i.e., multiple diffuse lesions, presence of CTO, or high SYNTAX score) PCI  U (4)  Indication: 64; Score: 4   Patient Information:   3V-CAD without LMCA E06c - Intermediate-high CAD burden (i.e., multiple diffuse lesions, presence of CTO, or high SYNTAX score) CABG  A (9)  Indication: 64; Score: 9   Patient Information:   LMCA-CAD With Isolated LMCA stenosis  PCI  U (6)  Indication: 65; Score: 6   Patient Information:   LMCA-CAD With Isolated LMCA stenosis  CABG  A (9)  Indication: 65; Score: 9   Patient Information:   LMCA-CAD Additional CAD, low CAD burden (i.e., 1- to 2-vessel additional involvement, low SYNTAX score) PCI  U (5)  Indication: 66; Score: 5   Patient Information:   LMCA-CAD Additional CAD, low CAD burden (i.e., 1- to 2-vessel additional involvement, low SYNTAX score) CABG  A (9)  Indication: 66; Score: 9   Patient  Information:   LMCA-CAD Additional CAD, intermediate-high CAD burden (i.e., 3-vessel involvement, presence of CTO, or high SYNTAX score) PCI  I (3)  Indication: 67; Score: 3   Patient Information:   LMCA-CAD Additional CAD, intermediate-high CAD burden (i.e., 3-vessel involvement, presence of CTO, or high SYNTAX score) CABG  A (9)  Indication: 67; Score: 9        HARDING, DAVID W

## 2014-12-02 NOTE — Progress Notes (Signed)
PROGRESS NOTE  Sukanya Goldblatt HKV:[id number] DOB: [birth year] DOA: 12/02/2014 PCP: Pcp Not In System  HPI: Miss Piehl is a 54 y.o. female s/p stent placement on 11/16/14, who presented to the ED last night with SOB, chest pain and nausea.  Subjective / 24 H Interval events Miss Liptak reports that she has been having chest pains since undergoing cardiac catheterization. The last couple days have been worse, but she did not take nitroglycerin because she could not distinguish if it was anxiety or her heart. Pain today is somewhat improved but persists.  - complains of dizziness that she associates with a recent L ear infection, also pain in R ear that increases with jaw movement - denies sob, fever, chills, cough, hemoptysis, weakness   Assessment/Plan: Principal Problem:   Chest pain Active Problems:   Poorly controlled type 2 diabetes mellitus with circulatory disorder   Hyperlipidemia associated with type 2 diabetes mellitus   Essential hypertension   Hypothyroid   NSTEMI (non-ST elevated myocardial infarction)   CAD S/P percutaneous coronary angioplasty   Cigarette smoker   Anxiety and depression   Hypokalemia   Chest Pain in setting of known CAD / UA in high risk patient  - patient recently hospitalized with NSTEMI s/p PCI ~3 weeks ago - reports compliance with her Brilinta  - cardiology consulted, appreciate input, will undergo cath  - she has continued to have intermittent chest pains at home since her discharge - mild troponin elevation, flat  Essential hypertension - blood pressure monitoring - continue lisinopril, carvedilol  Poorly controlled type 2 diabetes mellitus with circulatory disorder - most recent A1C 12.5 - accucheck, sliding scale, detemir nightly  Hyperlipidemia associated with type 2 diabetes mellitus - continue atorvastatin  Hypothyroid - continue levothyroxine  Hypokalemia - continue potassium supplement  Tobacco abuse, in remission - quit  last month when she was hospitalized, continued abstinence   Diet: Diet NPO time specified Fluids: NS DVT Prophylaxis: Heparin  Code Status: Full Code Family Communication: None Disposition Plan: observation and supportive care until cleared by cardiology  Consultants:  cardiology  Procedures:  Cardiac catheterization pending  Antibiotics None   Studies  Dg Chest 2 View  12/02/2014   CLINICAL DATA:  Acute onset of left-sided chest pain and shortness of breath. Initial encounter.  EXAM: CHEST  2 VIEW  COMPARISON:  None.  FINDINGS: The lungs are well-aerated. Minimal left basilar atelectasis is noted. There is no evidence of pleural effusion or pneumothorax.  The heart is normal in size; the mediastinal contour is within normal limits. No acute osseous abnormalities are seen.  IMPRESSION: Minimal left basilar atelectasis noted.  Lungs otherwise clear.   Electronically Signed   By: Garald Balding M.D.   On: 12/02/2014 00:31    Objective  Filed Vitals:   12/02/14 0215 12/02/14 0245 12/02/14 0319 12/02/14 0325  BP: 140/66 113/64 154/86 154/86  Pulse: 60 65  71  Temp:    98 F (36.7 C)  TempSrc:    Oral  Resp: 9 16 20 20   Height:    5\' 2"  (1.575 m)  Weight:    92.9 kg (204 lb 12.9 oz)  SpO2: 100% 100%  100%    Intake/Output Summary (Last 24 hours) at 12/02/14 1336 Last data filed at 12/02/14 0800  Gross per 24 hour  Intake      0 ml  Output      0 ml  Net      0 ml   Danley Danker  Weights   12/02/14 0325  Weight: 92.9 kg (204 lb 12.9 oz)    Exam:  General:  NAD, resting in bed comfortably  HEENT: normocephalic, no scleral icterus, L ear prosthetic TM, R ear TM intact/pearly grey  Cardiovascular: RRR without MRG, trace lower extremity edema  Respiratory: decreased breath sounds biL, no wheezing, no crackles, no rales  Abdomen: soft, non tender, no guarding  MSK/Extremities: no clubbing/cyanosis, no joint swelling  Skin: no rashes, no obvious lesions or  contusions  Neurology: grossly intact, nonfocal  Data Reviewed: Basic Metabolic Panel:  Recent Labs Lab 12/01/14 2337 12/02/14 0450  NA 139 139  K 3.4* 3.3*  CL 103 104  CO2 26 26  GLUCOSE 191* 123*  BUN 11 12  CREATININE 0.50 0.50  CALCIUM 9.4 9.1   Liver Function Tests:  Recent Labs Lab 12/02/14 0450  AST 15  ALT 12*  ALKPHOS 50  BILITOT 0.4  PROT 6.4*  ALBUMIN 3.6   CBC:  Recent Labs Lab 12/01/14 2337 12/02/14 0450  WBC 8.3 7.3  HGB 11.8* 11.6*  HCT 36.0 35.2*  MCV 90.2 89.8  PLT 239 200   Cardiac Enzymes:  Recent Labs Lab 12/02/14 0450 12/02/14 1130  TROPONINI 0.05* 0.04*   BNP (last 3 results)  Recent Labs  12/01/14 2337  BNP 127.8*   CBG:  Recent Labs Lab 12/02/14 0629 12/02/14 1119  GLUCAP 142* 111*   Scheduled Meds: . sodium chloride   Intravenous STAT  . [START ON 12/03/2014] aspirin  81 mg Oral Pre-Cath  . atorvastatin  80 mg Oral q1800  . carvedilol  3.125 mg Oral BID WC  . heparin  5,000 Units Subcutaneous 3 times per day  . insulin aspart  0-9 Units Subcutaneous TID WC  . insulin detemir  22 Units Subcutaneous QHS  . levothyroxine  200 mcg Oral QAC breakfast  . lisinopril  10 mg Oral Daily  . nicotine  21 mg Transdermal Daily  . nitroGLYCERIN  0.2 mg Transdermal Daily  . potassium chloride  20 mEq Oral Once  . sodium chloride  3 mL Intravenous Q12H  . sodium chloride  3 mL Intravenous Q12H  . ticagrelor  90 mg Oral BID   Continuous Infusions: . sodium chloride      Shana Hornbeck, PA-S   Time Spent: 25 minutes   Marzetta Board, MD [facility] Pager [phone]. If 7 PM - 7 AM, please contact night-coverage at [email or web address], password [facility] 12/02/2014, 1:36 PM  LOS: 0 days

## 2014-12-02 NOTE — H&P (View-Only) (Signed)
Name: Jacqueline Munoz is a 54 y.o. female Admit date: 12/02/2014 Referring Physician:  C. Cruzita Lederer, MD Primary Physician:  Keane Police, MD Primary Cardiologist:  Glenetta Hew , MD  Reason for Consultation:  Class 3 angina   ASSESSMENT: 1. Class 3 angina pectoris / Unstable Angina. 2. Native multivessel CAD with Circumflex bifurcation crush and overlapping mid LAD DES stents, 11/16/14. Also CTO RCA with collaterals from circumflex. 3. DM type 2 with circulatory complications 4. Hypertention with poor control 5. Obesity 6. Hyperlipidemia 7. Tobacco 8. Hypokalemia  PLAN: 1. Coronary angio and possible PCI recommended to check stent patency and r/o plaque rupture in native circulation. Discussed cath and possible PCI with the patient including risk of stroke, death, MI, CABG, limb ischemia, bleeding and kidney injury. The patient agrees to this approach and is willing to proceed.. 2. Further titrate med therapy for better BP control. 3. Add LA nitrate at low dose initially since sl NTG causes severe HA.   HPI: 54 yo with CTO RCA, overlapping LAD and Crush mid circumflex bifurcation crush DES on 11/16/14. Had NSTEMI on 11/15/14 and did well post stenting until returned to [facility], when she began noticing chest pressure with exertion. This is new and different than presenting complaint in May.Over past 72 hours she gets severe chest tightness with climbing one flight of stairs and in ER experienced chest pressure with the anxiety of having an IV started. Minimal troponin elevation may be residual from recent ACS or related to current presentation. Currently pain free.  PMH:   Past Medical History  Diagnosis Date  . Fibromyalgia   . Tobacco abuse   . Hypothyroid   . Family history of adverse reaction to anesthesia     "mother died twice during surgery; they brought her back both times" (11/15/2014)  . Sleep apnea     "dx'd; don't wear a mask" (11/15/2014)  . History of stomach ulcers  1981  . Migraine     "couple/month" (11/15/2014)  . Headache     "@ least weekly" (11/15/2014)  . Arthritis     "hips, ankles, knees, hands, back" (11/15/2014)  . Chronic back pain     "all over"  . Anxiety     "social"  . Chronic depression   . Kidney stones     "I've had several"  . Poorly controlled type 2 diabetes mellitus with circulatory disorder 11/15/2014  . Essential hypertension 11/15/2014  . Hyperlipidemia associated with type 2 diabetes mellitus 11/15/2014  . NSTEMI (non-ST elevated myocardial infarction) 11/15/2014  . CAD S/P percutaneous coronary angioplasty 11/16/2014    Mid LAD to Dist LAD lesion, 90% -->0% Post PCI - 2 overlap DES ( Promus Premier DES 3.0 x 16 - 2.75  x 16), Mid Cx to Dist Cx lesion, 95%--> 0% post DES PCI (Synergy DES 2.25 x 38 -mini-crushed @ Ostial OM1 , Ostial-prox OM1 90% -> 0% post DES PCI (Synergy DES 2.75 x 24 -> 3.0 mm) - bifurcation minicrush of mCx stent  . Coronary artery chronic total occlusion: 100% RCA CTO 11/16/2014    Prox RCA to Mid RCA lesion, 100% stenosed.   . Type IVa MI - Peri-PCI 11/17/2014    Prolonged ischemia during bifurcation PCI of  m-d Cx-OM1 with minicrush technique    PSH:   Past Surgical History  Procedure Laterality Date  . Cesarean section  2000  . Inner ear surgery Bilateral "several"  . Tonsillectomy    . Total abdominal hysterectomy  ~ 2008  .  Tubal ligation  2000  . Cardiac catheterization N/A 11/16/2014    Procedure: Left Heart Cath and Coronary Angiography;  Surgeon: Peter M Martinique, MD;  Location: [place] [facility];  Service: Cardiovascular;  Laterality: N/A;  . Cardiac catheterization  11/16/2014    Procedure: Coronary Stent Intervention;  Surgeon: Peter M Martinique, MD;  Location: [place] [facility];  Service: Cardiovascular;;   Allergies:  Corticosteroids and Penicillins Prior to Admit Meds:   Prescriptions prior to admission  Medication Sig Dispense Refill Last Dose  . aspirin EC 81 MG EC tablet Take 1 tablet  (81 mg total) by mouth daily. 30 tablet 0 12/02/2014 at Unknown time  . atorvastatin (LIPITOR) 80 MG tablet Take 1 tablet (80 mg total) by mouth daily at 6 PM. 30 tablet 0 12/01/2014 at Unknown time  . carvedilol (COREG) 3.125 MG tablet Take 1 tablet (3.125 mg total) by mouth 2 (two) times daily with a meal. 60 tablet 0 12/01/2014 at 2200  . glucose monitoring kit (FREESTYLE) monitoring kit 1 each by Does not apply route 4 (four) times daily - after meals and at bedtime. 1 month Diabetic Testing Supplies for QAC-QHS accuchecks. 1 each 1 12/01/2014 at Unknown time  . ibuprofen (ADVIL,MOTRIN) 200 MG tablet Take 800 mg by mouth every 8 (eight) hours as needed for mild pain or moderate pain.   12/01/2014 at Unknown time  . insulin aspart (NOVOLOG FLEXPEN) 100 UNIT/ML FlexPen Inject 2-15 Units into the skin 3 (three) times daily with meals. Correction coverage: Moderate (average weight, post-op)     CBG < 70: implement hypoglycemia protocol    CBG 70 - 120: 0 units    CBG 121 - 150: 2 units    CBG 151 - 200: 3 units    CBG 201 - 250: 5 units    CBG 251 - 300: 8 units    CBG 301 - 350: 11 units    CBG 351 - 400: 15 units    CBG > 400 call MD and obtain STAT lab verification    15 mL 11 12/01/2014 at Unknown time  . Insulin Detemir (LEVEMIR FLEXPEN) 100 UNIT/ML Pen Inject 35 Units into the skin at bedtime. 15 mL 11 12/01/2014 at Unknown time  . levothyroxine (SYNTHROID, LEVOTHROID) 200 MCG tablet Take 1 tablet (200 mcg total) by mouth daily before breakfast. 30 tablet 0 12/01/2014 at Unknown time  . lisinopril (PRINIVIL,ZESTRIL) 10 MG tablet Take 1 tablet (10 mg total) by mouth daily. 30 tablet 0 12/01/2014 at Unknown time  . metFORMIN (GLUCOPHAGE) 500 MG tablet Take 1 tablet (500 mg total) by mouth 2 (two) times daily with a meal. 60 tablet 0 12/01/2014 at Unknown time  . NEEDLE, DISP, 30 G (BD DISP NEEDLES) 30G X 1/2" MISC 1 each by Does not apply route 3 (three) times daily before meals. 100 each 3  12/01/2014 at Unknown time  . nicotine (NICODERM CQ - DOSED IN MG/24 HOURS) 21 mg/24hr patch Place 21 mg onto the skin daily.   11/30/2014  . nitroGLYCERIN (NITROSTAT) 0.4 MG SL tablet Place 1 tablet (0.4 mg total) under the tongue every 5 (five) minutes as needed for chest pain. 50 tablet 3 unkn  . ticagrelor (BRILINTA) 90 MG TABS tablet Take 1 tablet (90 mg total) by mouth 2 (two) times daily. 60 tablet 0 12/01/2014 at Unknown time  . acetaminophen-codeine (TYLENOL #3) 300-30 MG per tablet Take 1-2 tablets by mouth every 4 (four) hours as needed for moderate  pain. (Patient not taking: Reported on 12/02/2014) 30 tablet 0 Not Taking at Unknown time  . furosemide (LASIX) 20 MG tablet Take 0.5 tablets (10 mg total) by mouth daily. Take for 3 days for swelling. (Patient not taking: Reported on 12/02/2014) 4 tablet 0 Not Taking at Unknown time   Fam HX:    Family History  Problem Relation Age of Onset  . Hyperlipidemia Mother    Social HX:    History   Social History  . Marital Status: Widowed    Spouse Name: N/A  . Number of Children: N/A  . Years of Education: N/A   Occupational History  . Not on file.   Social History Main Topics  . Smoking status: Current Every Day Smoker -- 0.00 packs/day for 36 years    Types: Cigarettes  . Smokeless tobacco: Never Used  . Alcohol Use: No     Comment: 11/15/2014 "might have a few drinks/yr"  . Drug Use: Yes    Special: Marijuana     Comment: "stopped all drugs in 1987"  . Sexual Activity: Not on file   Other Topics Concern  . Not on file   Social History Narrative     Review of Systems: No smoking since discharge in may. Advocates medication compliance. Left upper quadrant and left lateral abdominal discomfort but no tenderness.  Physical Exam: Blood pressure 154/86, pulse 71, temperature 98 F (36.7 C), temperature source Oral, resp. rate 20, height $RemoveBe'5\' 2"'DlHdJcTTm$  (1.575 m), weight 204 lb 12.9 oz (92.9 kg), SpO2 100 %. Weight change:    Moderate  obesity with anxiety and normal skin color HEENT exam reveals no jaundice. No pallor is noted. Neck veins are not elevated. Neck exam reveals no JVD or carotid bruits. Carotids are 2+ and symmetric. Chest is clear to auscultation and percussion. Cardiac exam reveals no murmur or gallop. No pericardial rub is heard. Extremities reveal no edema. Right radial and left radial pulses are present. Right radial is 1-2+, left radial is 2-3+. Right radial is a site of prior cath. Dorsalis pedis pulses are 2+ bilaterally. Abdomen is obese. No focal tenderness is noted. Neurological exam reveals no focal motor or sensory deficits.  Labs: Lab Results  Component Value Date   WBC 7.3 12/02/2014   HGB 11.6* 12/02/2014   HCT 35.2* 12/02/2014   MCV 89.8 12/02/2014   PLT 200 12/02/2014    Recent Labs Lab 12/02/14 0450  NA 139  K 3.3*  CL 104  CO2 26  BUN 12  CREATININE 0.50  CALCIUM 9.1  PROT 6.4*  BILITOT 0.4  ALKPHOS 50  ALT 12*  AST 15  GLUCOSE 123*   No results found for: PTT Lab Results  Component Value Date   INR 0.94 12/02/2014   INR 0.99 11/15/2014   INR 0.91 11/15/2014   Lab Results  Component Value Date   TROPONINI 0.05* 12/02/2014     Lab Results  Component Value Date   CHOL 281* 11/16/2014   Lab Results  Component Value Date   HDL 33* 11/16/2014   Lab Results  Component Value Date   LDLCALC UNABLE TO CALCULATE IF TRIGLYCERIDE OVER 400 mg/dL 11/16/2014   Lab Results  Component Value Date   TRIG 561* 11/16/2014   Lab Results  Component Value Date   CHOLHDL 8.5 11/16/2014   No results found for: LDLDIRECT    Radiology:  Dg Chest 2 View  12/02/2014   CLINICAL DATA:  Acute onset of left-sided chest pain  and shortness of breath. Initial encounter.  EXAM: CHEST  2 VIEW  COMPARISON:  None.  FINDINGS: The lungs are well-aerated. Minimal left basilar atelectasis is noted. There is no evidence of pleural effusion or pneumothorax.  The heart is normal in size; the  mediastinal contour is within normal limits. No acute osseous abnormalities are seen.  IMPRESSION: Minimal left basilar atelectasis noted.  Lungs otherwise clear.   Electronically Signed   By: Garald Balding M.D.   On: 12/02/2014 00:31    EKG:  Normal sinus rhythm with no acute changes of ischemia noted.    Sinclair Grooms 12/02/2014 10:22 AM

## 2014-12-03 DIAGNOSIS — I208 Other forms of angina pectoris: Secondary | ICD-10-CM

## 2014-12-03 LAB — BASIC METABOLIC PANEL
Anion gap: 9 (ref 5–15)
BUN: 6 mg/dL (ref 6–20)
CALCIUM: 8.4 mg/dL — AB (ref 8.9–10.3)
CHLORIDE: 106 mmol/L (ref 101–111)
CO2: 23 mmol/L (ref 22–32)
Creatinine, Ser: 0.56 mg/dL (ref 0.44–1.00)
GFR calc Af Amer: >60 mL/min (ref >60)
GLUCOSE: 122 mg/dL — AB (ref 65–99)
POTASSIUM: 3.7 mmol/L (ref 3.5–5.1)
SODIUM: 138 mmol/L (ref 135–145)

## 2014-12-03 LAB — LDL CHOLESTEROL, DIRECT: LDL DIRECT: 56 mg/dL (ref 0–99)

## 2014-12-03 LAB — CBC
HEMATOCRIT: 32.6 % — AB (ref 36.0–46.0)
HEMOGLOBIN: 10.5 g/dL — AB (ref 12.0–15.0)
MCH: 29 pg (ref 26.0–34.0)
MCHC: 32.2 g/dL (ref 30.0–36.0)
MCV: 90.1 fL (ref 78.0–100.0)
Platelets: 198 10*3/uL (ref 150–400)
RBC: 3.62 MIL/uL — ABNORMAL LOW (ref 3.87–5.11)
RDW: 13.6 % (ref 11.5–15.5)
WBC: 7.5 10*3/uL (ref 4.0–10.5)

## 2014-12-03 LAB — GLUCOSE, CAPILLARY: GLUCOSE-CAPILLARY: 99 mg/dL (ref 65–99)

## 2014-12-03 MED ORDER — OXYCODONE-ACETAMINOPHEN 5-325 MG PO TABS: 1.0000 | ORAL_TABLET | Freq: Four times a day (QID) | ORAL | Status: DC | PRN

## 2014-12-03 MED ORDER — ASPIRIN EC 81 MG PO TBEC
81.0000 mg | DELAYED_RELEASE_TABLET | Freq: Every day | ORAL | Status: DC
  Administered 2014-12-03: 81 mg via ORAL
  Filled 2014-12-03: qty 1

## 2014-12-03 MED ORDER — OXYCODONE HCL 5 MG PO TABS
5.0000 mg | ORAL_TABLET | ORAL | Status: DC | PRN
  Administered 2014-12-03 (×3): 5 mg via ORAL
  Filled 2014-12-03 (×3): qty 1

## 2014-12-03 NOTE — Evaluation (Signed)
Physical Therapy Evaluation Patient Details Name: Jacqueline Munoz MRN: [id number] DOB: 1960/10/13 Today's Date: 12/03/2014   History of Present Illness  Pt. is a 54 year old female admitted 12/02/14 with chest pain. Pt. with h/o NSTEMI  5/16 and underwent stenting.  Pt. had heart cath on 12/02/14 with patnet stent.  PMH inculeds PVD, CAD, tobacco abuse.    Clinical Impression  Pt. Presents to PT without chest pain during ambulation.  She is independent in walking and transfers without assistive device with no overt LOB noted.  Pt. Feels she is at her baseline mobility level , and I do not identify any further need for PT intervention at this time.  Pt. Has DC orders.  I will sign off.      Follow Up Recommendations No PT follow up    Equipment Recommendations  None recommended by PT    Recommendations for Other Services       Precautions / Restrictions Precautions Precautions: None Restrictions Weight Bearing Restrictions: No      Mobility  Bed Mobility Overal bed mobility:  (not tested, up in recliner)                Transfers Overall transfer level: Independent Equipment used: None             General transfer comment: did not need to push up with hands; able to stand with use of legs for powering up  Ambulation/Gait Ambulation/Gait assistance: Independent Ambulation Distance (Feet): 200 Feet Assistive device: None Gait Pattern/deviations: WFL(Within Functional Limits) Gait velocity: normal Gait velocity interpretation: at or above normal speed for age/gender General Gait Details: No overt LOB, no instabilities noted; pt. feels she is walking at her baseline level of function.  She reports a fall several weeks ago from slipping in some water on the [employer]    Modified Rankin (Stroke Patients Only)       Balance Overall balance assessment: Independent                                            Pertinent Vitals/Pain Pain Assessment: 0-10 Pain Score: 8  Pain Location: posterior to right ear, occipital.  Pt. reports she was told she has TMJ pain, however she does not display soreness or tenderness at the acuteal right TMJ, but pain located posterior to ear in occipital area Pain Descriptors / Indicators: Discomfort;Sore Pain Intervention(s): Monitored during session  Pt's telemetry has already been DC'd, no vitals readily available, no SOB or dyspnea noted.    Home Living Family/patient expects to be discharged to:: Private residence Living Arrangements: Parent;Children Available Help at Discharge: Family;Available 24 hours/day Type of Home: House Home Access: Stairs to enter Entrance Stairs-Rails: None Entrance Stairs-Number of Steps: 2 Home Layout: One level Home Equipment: None      Prior Function Level of Independence: Independent               Hand Dominance        Extremity/Trunk Assessment   Upper Extremity Assessment: Overall WFL for tasks assessed           Lower Extremity Assessment: Overall WFL for tasks assessed      Cervical / Trunk Assessment: Normal  Communication   Communication: No difficulties  Cognition Arousal/Alertness: Awake/alert Behavior During Therapy: [facility]  for tasks assessed/performed Overall Cognitive Status: Within Functional Limits for tasks assessed                      General Comments      Exercises        Assessment/Plan    PT Assessment Patent does not need any further PT services  PT Diagnosis Other (comment) (s/p cardiac cath)   PT Problem List    PT Treatment Interventions     PT Goals (Current goals can be found in the Care Plan section)      Frequency     Barriers to discharge        Co-evaluation               End of Session   Activity Tolerance: Patient tolerated treatment well Patient left: in chair;with family/visitor present Nurse Communication: Mobility status          Time: 1055-1110 PT Time Calculation (min) (ACUTE ONLY): 15 min   Charges:   PT Evaluation $Initial PT Evaluation Tier I: 1 Procedure     PT G CodesLadona Ridgel 12/03/2014, 11:21 AM [place] [phone] [place] [phone]

## 2014-12-03 NOTE — Discharge Instructions (Addendum)
Follow with [facility] in 5-7 days  Please get a complete blood count and chemistry panel checked by your Primary MD at your next visit, and again as instructed by your Primary MD. Please get your medications reviewed and adjusted by your Primary MD.  Please request your Primary MD to go over all Hospital Tests and Procedure/Radiological results at the follow up, please get all Hospital records sent to your Prim MD by signing hospital release before you go home.  If you had Pneumonia of Lung problems at the Hospital: Please get a 2 view Chest X ray done in 6-8 weeks after hospital discharge or sooner if instructed by your Primary MD.  If you have Congestive Heart Failure: Please call your Cardiologist or Primary MD anytime you have any of the following symptoms:  1) 3 pound weight gain in 24 hours or 5 pounds in 1 week  2) shortness of breath, with or without a dry hacking cough  3) swelling in the hands, feet or stomach  4) if you have to sleep on extra pillows at night in order to breathe  Follow cardiac low salt diet and 1.5 lit/day fluid restriction.  If you have diabetes Accuchecks 4 times/day, Once in AM empty stomach and then before each meal. Log in all results and show them to your primary doctor at your next visit. If any glucose reading is under 80 or above 300 call your primary MD immediately.  If you have Seizure/Convulsions/Epilepsy: Please do not drive, operate heavy machinery, participate in activities at heights or participate in high speed sports until you have seen by Primary MD or a Neurologist and advised to do so again.  If you had Gastrointestinal Bleeding: Please ask your Primary MD to check a complete blood count within one week of discharge or at your next visit. Your endoscopic/colonoscopic biopsies that are pending at the time of discharge, will also need to followed by your Primary MD.  Get Medicines reviewed and  adjusted. Please take all your medications with you for your next visit with your Primary MD  Please request your Primary MD to go over all hospital tests and procedure/radiological results at the follow up, please ask your Primary MD to get all Hospital records sent to his/her office.  If you experience worsening of your admission symptoms, develop shortness of breath, life threatening emergency, suicidal or homicidal thoughts you must seek medical attention immediately by calling 911 or calling your MD immediately  if symptoms less severe.  You must read complete instructions/literature along with all the possible adverse reactions/side effects for all the Medicines you take and that have been prescribed to you. Take any new Medicines after you have completely understood and accpet all the possible adverse reactions/side effects.   Do not drive or operate heavy machinery when taking Pain medications.   Do not take more than prescribed Pain, Sleep and Anxiety Medications  Special Instructions: If you have smoked or chewed Tobacco  in the last 2 yrs please stop smoking, stop any regular Alcohol  and or any Recreational drug use.  Wear Seat belts while driving.  Please note You were cared for by a hospitalist during your hospital stay. If you have any questions about your discharge medications or the care you received while you were in the hospital after you are discharged, you can call the unit and asked to speak with the hospitalist on call if the hospitalist that took care of you is  not available. Once you are discharged, your primary care physician will handle any further medical issues. Please note that NO REFILLS for any discharge medications will be authorized once you are discharged, as it is imperative that you return to your primary care physician (or establish a relationship with a primary care physician if you do not have one) for your aftercare needs so that they can reassess your need  for medications and monitor your lab values.  You can reach the hospitalist office at phone [phone] or fax [phone]   If you do not have a primary care physician, you can call [phone] for a physician referral.  Activity: As tolerated with Full fall precautions use walker/cane & assistance as needed  Diet: heart healthy  Disposition Home

## 2014-12-03 NOTE — Progress Notes (Signed)
CARDIAC REHAB PHASE I   PRE:  Rate/Rhythm: 68  BP:  Sitting: 134/66     SaO2: 98% RA  MODE:  Ambulation: 1000 ft   POST:  Rate/Rhythm: 79  BP:  Sitting: 142/77     SaO2: 99% RA  9:09am-10:07am Patient ambulated without any complaints. Discussed risk factors, exercising at home, chest pain and NTG, restrictions, nutrition, smoking cessation.  Patient is interested in Cardiac Rehab at [facility]. Patient currently resides in [place], [place] but will be moving to [place] within the next few weeks. Patient left in chair with call bell in reach.   Romonda Parker, [place], [place] 12/03/2014 10:04 AM

## 2014-12-03 NOTE — Progress Notes (Signed)
Subjective: She says she might have TMJ and is supposed to FU at the health clinic.  Objective: Vital signs in last 24 hours: Temp:  [97.9 F (36.6 C)-98.1 F (36.7 C)] 98 F (36.7 C) (06/04 0414) Pulse Rate:  [48-288] 53 (06/04 0414) Resp:  [0-53] 13 (06/04 0414) BP: (107-152)/(47-93) 110/74 mmHg (06/04 0414) SpO2:  [95 %-100 %] 97 % (06/04 0414) Weight:  [206 lb 9.1 oz (93.7 kg)] 206 lb 9.1 oz (93.7 kg) (06/04 0500) Last BM Date: 11/30/14  Intake/Output from previous day: 06/03 0701 - 06/04 0700 In: 687.7 [P.O.:200; I.V.:487.7] Out: 2900 [Urine:2900] Intake/Output this shift:    Medications Scheduled Meds: . atorvastatin  80 mg Oral q1800  . carvedilol  3.125 mg Oral BID WC  . heparin  5,000 Units Subcutaneous 3 times per day  . insulin aspart  0-9 Units Subcutaneous TID WC  . insulin detemir  22 Units Subcutaneous QHS  . levothyroxine  200 mcg Oral QAC breakfast  . lisinopril  10 mg Oral Daily  . nicotine  21 mg Transdermal Daily  . nitroGLYCERIN  0.2 mg Transdermal Daily  . sodium chloride  3 mL Intravenous Q12H  . sodium chloride  3 mL Intravenous Q12H  . ticagrelor  90 mg Oral BID   Continuous Infusions:  PRN Meds:.sodium chloride, [DISCONTINUED] acetaminophen **OR** acetaminophen, acetaminophen, alum & mag hydroxide-simeth, ibuprofen, morphine injection, nitroGLYCERIN, ondansetron **OR** ondansetron (ZOFRAN) IV, oxyCODONE, sodium chloride  PE: General appearance: alert, cooperative and no distress Lungs: clear to auscultation bilaterally Heart: regular rate and rhythm, S1, S2 normal, no murmur, click, rub or gallop Extremities: Trace LEE Pulses: 2+ and symmetric Skin: Radial cath sight stable Neurologic: Grossly normal  Lab Results:   Recent Labs  12/01/14 2337 12/02/14 0450 12/03/14 0256  WBC 8.3 7.3 7.5  HGB 11.8* 11.6* 10.5*  HCT 36.0 35.2* 32.6*  PLT 239 200 198   BMET  Recent Labs  12/01/14 2337 12/02/14 0450 12/03/14 0256  NA 139  139 138  K 3.4* 3.3* 3.7  CL 103 104 106  CO2 26 26 23   GLUCOSE 191* 123* 122*  BUN 11 12 6   CREATININE 0.50 0.50 0.56  CALCIUM 9.4 9.1 8.4*   PT/INR  Recent Labs  12/02/14 0450  LABPROT 12.8  INR 0.94   1. Severe three-vessel disease with known RCA occlusion, widely patent stents in the circumflex-OM bifurcation region and mid LAD, but upstream LAD 99% focal stenosis 2. Mid LAD lesion, 99% stenosed proximal to previously placed overlapping stents. A third overlapping drug-eluting stent was placed. There is a 0% residual stenosis post intervention. 3. Known Prox RCA to Mid RCA lesion, 100% stenosed. 4. Widely patent circumflex-OM stents 5. Well-preserved LVEF with normal LVEDP  Class 3 Angina/Acute Coronary Syndrome secondary to [employer] 99% focal lesion at the second septal perforator, proximal to the more proximal overlapping DES stent in the mid LAD. This lesion was treated with a another 3 mm Promus DES stent overlapping the more proximal of the previously placed stents.   Recommendations:  Standard post radial cath PCI care with TR band removal  Continue dual and platelet therapy for minimum one year  Continue aggressive risk factor modification with close glycemic control, smoking cessation, statin and blood pressure control (can likely titrate beta blocker dose)   Assessment/Plan  Principal Problem:   Chest pain Active Problems:   Poorly controlled type 2 diabetes mellitus with circulatory disorder   Hyperlipidemia associated with type 2 diabetes mellitus  Essential hypertension   Hypothyroid   NSTEMI (non-ST elevated myocardial infarction)   CAD S/P percutaneous coronary angioplasty   Cigarette smoker   Anxiety and depression   Hypokalemia   Chest pain on exertion   Angina, class III   SP LHC revealing severe three-vessel disease with known RCA occlusion, widely patent stents in the circumflex-OM bifurcation region and mid LAD, but upstream LAD 99% focal  stenosis.  Mid LAD lesion, 99% stenosed proximal to previously placed overlapping stents. A third overlapping drug-eluting stent was placed. There is a 0% residual stenosis post intervention.  Known Prox RCA to Mid RCA lesion, 100% stenosed.  Widely patent circumflex-OM stents.  Well-preserved LVEF with normal LVEDP   ASA, Brilinta, coreg 3.125 bid, lisinopril 10.  Bp controlled.  She reports quitting smoking and is using patches.  DC home today.   Will provide a few days worth of percocet.    LOS: 1 day    HAGER, BRYAN PA-C 12/03/2014 7:38 AM  Patient seen and discussed with PA Samara Snide, I agree with his documentation. Patient with known hx of CAD admitted with chest pain. Cath 12/02/14 known RCA occlusion/CTO, patent stents in LCX, patent stent mid LAD. More prox to LAD stent 99% stenosis, s/p DES this admission. LVEF 55-65% by LV gram. Medical therapy with ASA 81, atorva 80, coreg, lisinopril, ticagrelor. No symptoms overnight, ok for discharge today from a cardiology standpoint. Will need f/u in 2-3 weeks with Dr Ellyn Hack or one of our PAs   Zandra Abts MD

## 2014-12-03 NOTE — Discharge Summary (Signed)
Physician Discharge Summary  Jacqueline Munoz QNV:[id number] DOB: 04-25-1961 DOA: 12/02/2014  PCP: Pcp Not In System  Admit date: 12/02/2014 Discharge date: 12/03/2014  Time spent: > 30 minutes  Recommendations for Outpatient Follow-up:  1. Follow up with cardiology in 1-2 weeks   Discharge Diagnoses:  Principal Problem:   Chest pain Active Problems:   Poorly controlled type 2 diabetes mellitus with circulatory disorder   Hyperlipidemia associated with type 2 diabetes mellitus   Essential hypertension   Hypothyroid   NSTEMI (non-ST elevated myocardial infarction)   CAD S/P percutaneous coronary angioplasty   Cigarette smoker   Anxiety and depression   Hypokalemia   Chest pain on exertion   Angina, class III  Discharge Condition: stable  Diet recommendation: heart healthy  Filed Weights   12/02/14 0325 12/03/14 0033 12/03/14 0500  Weight: 92.9 kg (204 lb 12.9 oz) 93.7 kg (206 lb 9.1 oz) 93.7 kg (206 lb 9.1 oz)   History of present illness:  Jacqueline Munoz is a 54 y.o. female with PMH of hypertension, hyperlipidemia, diabetes mellitus, hypothyroidism, depression, excited, tobacco abuse, OSA, history of peptic ulcer, history of migraine, CAD, post status of stent placement on 11/16/14, who presents with chest pain. Patient reports that she had 2 episode of chest pain since yesterday afternoon. The first one occurred ~1600 when going up the stairs; the episode resolved after about 25 min of resting. She states the pain returned ~2100 also after exertion and lasted little longer than the first episode. Her chest pain is located in the left chest, 9 out of 10 in severity, radiating to the left arm. Pt notes her pain today feels similar to past MI, but notes she has been experiencing the same pain since her MI but pain today is worse. It was associated with mild shortness of breath, no cough. Her chest pain is not pleuritic, no tenderness over calf areas. Patient also reports having dysuria, but  no burning or increased urinary frequency recently. She has tenderness over TMJ joint area. Currently patient denies fever, chills, running nose, ear pain, headaches, abdominal pain, diarrhea, constipation, hematuria, skin rashes. No unilateral weakness, numbness or tingling sensations. No vision change or hearing loss. In ED, patient was found to have POC trop 0.06 which is negative, WBC 8.3, temperature 98.3, potassium is 3.4, chest x-ray negative. EKG showed low voltage. Patient is admitted to inpatient for further evaluation and treatment.  Hospital Course:  Patient was admitted to the hospital with chest pain in the setting of known coronary artery disease, she is a high-risk patient and just underwent cardiac catheterization 3 weeks before when she had stent placement. Cardiology was consulted this time, patient underwent a repeat cardiac catheterization which showed a mid LAD lesion, 99% stenosed proximal to the previously placed overlapping stents, and a third overlapping drug-eluting stent was placed. She was monitored overnight following the cardiac catheterization, had no further chest pain, and she was discharged home in stable condition to continue dual antiplatelets therapy with aspirin and Brilinta for at least 1 year. Her other medical problems hypertension, diabetes, have been stable and no medication changes were made on discharge. She continues to remain abstinent from tobacco use. She will have close follow-up with cardiology in about 2 weeks.  Procedures:  Cardiac catheterization  1. Severe three-vessel disease with known RCA occlusion, widely patent stents in the circumflex-OM bifurcation region and mid LAD, but upstream LAD 99% focal stenosis 2. Mid LAD lesion, 99% stenosed proximal to previously placed overlapping  stents. A third overlapping drug-eluting stent was placed. There is a 0% residual stenosis post intervention. 3. Known Prox RCA to Mid RCA lesion, 100%  stenosed. 4. Widely patent circumflex-OM stents 5. Well-preserved LVEF with normal LVEDP  Consultations:  Cardiology   Discharge Exam: Filed Vitals:   12/03/14 0033 12/03/14 0414 12/03/14 0500 12/03/14 0750  BP: 121/47 110/74  135/70  Pulse: 59 53  74  Temp: 97.9 F (36.6 C) 98 F (36.7 C)  98.3 F (36.8 C)  TempSrc: Axillary Oral  Oral  Resp: $Remo'18 13  18  'eWIqE$ Height:      Weight: 93.7 kg (206 lb 9.1 oz)  93.7 kg (206 lb 9.1 oz)   SpO2: 96% 97%  96%   General: NAD Cardiovascular: RRR Respiratory: CTA biL  Discharge Instructions  Discharge Instructions    Amb Referral to Cardiac Rehabilitation    Complete by:  As directed   Congestive Heart Failure: If diagnosis is Heart Failure, patient MUST meet each of the CMS criteria: 1. Left Ventricular Ejection Fraction </= 35% 2. NYHA class II-IV symptoms despite being on optimal heart failure therapy for at least 6 weeks. 3. Stable = have not had a recent (<6 weeks) or planned (<6 months) major cardiovascular hospitalization or procedure  Program Details: - Physician supervised classes - 1-3 classes per week over a 12-18 week period, generally for a total of 36 sessions  Physician Certification: I certify that the above Cardiac Rehabilitation treatment is medically necessary and is medically approved by me for treatment of this patient. The patient is willing and cooperative, able to ambulate and medically stable to participate in exercise rehabilitation. The participant's progress and Individualized Treatment Plan will be reviewed by the Medical Director, Cardiac Rehab staff and as indicated by the Referring/Ordering Physician.  Diagnosis:  PTCA            Medication List    TAKE these medications        acetaminophen-codeine 300-30 MG per tablet  Commonly known as:  TYLENOL #3  Take 1-2 tablets by mouth every 4 (four) hours as needed for moderate pain.     aspirin 81 MG EC tablet  Take 1 tablet (81 mg total) by mouth  daily.  Notes to Patient:  Prevents clotting in stent and heart attack     atorvastatin 80 MG tablet  Commonly known as:  LIPITOR  Take 1 tablet (80 mg total) by mouth daily at 6 PM.  Notes to Patient:  Cholesterol     carvedilol 3.125 MG tablet  Commonly known as:  COREG  Take 1 tablet (3.125 mg total) by mouth 2 (two) times daily with a meal.  Notes to Patient:  Decreases work of the heart by decreasing heart rate blood pressure and force of heart muscle contraction     furosemide 20 MG tablet  Commonly known as:  LASIX  Take 0.5 tablets (10 mg total) by mouth daily. Take for 3 days for swelling.  Notes to Patient:  Fluid pill     glucose monitoring kit monitoring kit  1 each by Does not apply route 4 (four) times daily - after meals and at bedtime. 1 month Diabetic Testing Supplies for QAC-QHS accuchecks.     ibuprofen 200 MG tablet  Commonly known as:  ADVIL,MOTRIN  Take 800 mg by mouth every 8 (eight) hours as needed for mild pain or moderate pain.     insulin aspart 100 UNIT/ML FlexPen  Commonly known as:  NOVOLOG  FLEXPEN  - Inject 2-15 Units into the skin 3 (three) times daily with meals. Correction coverage: Moderate (average weight, post-op)     -  CBG < 70: implement hypoglycemia protocol    -  CBG 70 - 120: 0 units    -  CBG 121 - 150: 2 units    -  CBG 151 - 200: 3 units    -  CBG 201 - 250: 5 units    -  CBG 251 - 300: 8 units    -  CBG 301 - 350: 11 units    -  CBG 351 - 400: 15 units    -  CBG > 400 call MD and obtain STAT lab verification    -      Insulin Detemir 100 UNIT/ML Pen  Commonly known as:  LEVEMIR FLEXPEN  Inject 35 Units into the skin at bedtime.     levothyroxine 200 MCG tablet  Commonly known as:  SYNTHROID, LEVOTHROID  Take 1 tablet (200 mcg total) by mouth daily before breakfast.  Notes to Patient:  Thyroid     lisinopril 10 MG tablet  Commonly known as:  PRINIVIL,ZESTRIL  Take 1 tablet (10 mg total) by  mouth daily.  Notes to Patient:  Blood pressure      metFORMIN 500 MG tablet  Commonly known as:  GLUCOPHAGE  Take 1 tablet (500 mg total) by mouth 2 (two) times daily with a meal.  Notes to Patient:  Hold to protect kidney, dye puts stress on kidney and metformin excreted through kidney, hold to decrease risk of complications with kidney function      NEEDLE (DISP) 30 G 30G X 1/2" Misc  Commonly known as:  BD DISP NEEDLES  1 each by Does not apply route 3 (three) times daily before meals.     nicotine 21 mg/24hr patch  Commonly known as:  NICODERM CQ - dosed in mg/24 hours  Place 21 mg onto the skin daily.  Notes to Patient:  Smoking cessation     nitroGLYCERIN 0.4 MG SL tablet  Commonly known as:  NITROSTAT  Place 1 tablet (0.4 mg total) under the tongue every 5 (five) minutes as needed for chest pain.     oxyCODONE-acetaminophen 5-325 MG per tablet  Commonly known as:  ROXICET  Take 1 tablet by mouth every 6 (six) hours as needed for severe pain.     ticagrelor 90 MG Tabs tablet  Commonly known as:  BRILINTA  Take 1 tablet (90 mg total) by mouth 2 (two) times daily.  Notes to Patient:  Prevents clotting in stent and heart attack           Follow-up Information    Follow up with Leonie Man, MD On 12/22/2014.   Specialty:  Cardiology   Why:  2:30 PM   Contact information:   [address] [place] [address] [phone]       The results of significant diagnostics from this hospitalization (including imaging, microbiology, ancillary and laboratory) are listed below for reference.    Significant Diagnostic Studies: Dg Chest 2 View  12/02/2014   CLINICAL DATA:  Acute onset of left-sided chest pain and shortness of breath. Initial encounter.  EXAM: CHEST  2 VIEW  COMPARISON:  None.  FINDINGS: The lungs are well-aerated. Minimal left basilar atelectasis is noted. There is no evidence of pleural effusion or pneumothorax.  The heart is normal in size;  the mediastinal contour is  within normal limits. No acute osseous abnormalities are seen.  IMPRESSION: Minimal left basilar atelectasis noted.  Lungs otherwise clear.   Electronically Signed   By: Garald Balding M.D.   On: 12/02/2014 00:31   Dg Chest 2 View  11/15/2014   CLINICAL DATA:  54 year old female with increased urination and dizziness. Hyperglycemia. Recent central chest pain. Initial encounter.  EXAM: CHEST  2 VIEW  COMPARISON:  None.  No acute osseous abnormality identified.  FINDINGS: Lung volumes are within normal limits, mild eventration of the right hemidiaphragm. Normal cardiac size and mediastinal contours. Visualized tracheal air column is within normal limits. Small calcified granuloma superior to the right hilum. Otherwise the lungs are clear. No pneumothorax or pleural effusion. No acute osseous abnormality identified.  IMPRESSION: No acute cardiopulmonary abnormality.   Electronically Signed   By: Genevie Ann M.D.   On: 11/15/2014 16:05   Dg Knee Complete 4 Views Right  11/15/2014   CLINICAL DATA:  Hyperglycemia.  Knee pain.  EXAM: RIGHT KNEE - COMPLETE 4+ VIEW  COMPARISON:  None.  FINDINGS: There is no evidence of fracture or dislocation. There is a moderate joint effusion. There is no evidence of arthropathy or other focal bone abnormality. Soft tissues are unremarkable.  IMPRESSION: No acute osseous injury of the right knee. Moderate nonspecific joint effusion.   Electronically Signed   By: Kathreen Devoid   On: 11/15/2014 16:05   Labs: Basic Metabolic Panel:  Recent Labs Lab 12/01/14 2337 12/02/14 0450 12/03/14 0256  NA 139 139 138  K 3.4* 3.3* 3.7  CL 103 104 106  CO2 $Re'26 26 23  'ovi$ GLUCOSE 191* 123* 122*  BUN $Re'11 12 6  'gDg$ CREATININE 0.50 0.50 0.56  CALCIUM 9.4 9.1 8.4*   Liver Function Tests:  Recent Labs Lab 12/02/14 0450  AST 15  ALT 12*  ALKPHOS 50  BILITOT 0.4  PROT 6.4*  ALBUMIN 3.6   CBC:  Recent Labs Lab 12/01/14 2337 12/02/14 0450 12/03/14 0256  WBC 8.3  7.3 7.5  HGB 11.8* 11.6* 10.5*  HCT 36.0 35.2* 32.6*  MCV 90.2 89.8 90.1  PLT 239 200 198   Cardiac Enzymes:  Recent Labs Lab 12/02/14 0450 12/02/14 1130  TROPONINI 0.05* 0.04*   BNP: BNP (last 3 results)  Recent Labs  12/01/14 2337  BNP 127.8*    CBG:  Recent Labs Lab 12/02/14 0629 12/02/14 1119 12/02/14 1430 12/02/14 2116 12/03/14 0621  GLUCAP 142* 111* 85 137* 99   Signed:  GHERGHE, COSTIN  [facility] 12/03/2014, 2:40 PM

## 2014-12-04 LAB — URINE CULTURE: Colony Count: 50000

## 2014-12-04 NOTE — Progress Notes (Signed)
Retro, Utilization review complete. Jonnie Finner RN CCM Case Mgmt phone [phone]

## 2014-12-05 ENCOUNTER — Ambulatory Visit (HOSPITAL_COMMUNITY)
Admission: RE | Admit: 2014-12-05 | Discharge: 2014-12-05 | Disposition: A | Discharge status: Home / Self Care | Payer: Medicare Other | Source: Ambulatory Visit | Attending: Family Medicine | Admitting: Family Medicine

## 2014-12-05 ENCOUNTER — Encounter (HOSPITAL_COMMUNITY): Payer: Self-pay | Admitting: Cardiology

## 2014-12-05 DIAGNOSIS — R1012 Left upper quadrant pain: Secondary | ICD-10-CM | POA: Diagnosis not present

## 2014-12-05 LAB — GLUCOSE, CAPILLARY: Glucose-Capillary: 92 mg/dL (ref 65–99)

## 2014-12-06 ENCOUNTER — Telehealth: Payer: Self-pay

## 2014-12-06 ENCOUNTER — Ambulatory Visit: Payer: Medicare Other

## 2014-12-06 MED FILL — Heparin Sodium (Porcine) 2 Unit/ML in Sodium Chloride 0.9%: INTRAMUSCULAR | Qty: 1000 | Status: AC

## 2014-12-06 MED FILL — Lidocaine HCl Local Preservative Free (PF) Inj 1%: INTRAMUSCULAR | Qty: 30 | Status: AC

## 2014-12-06 NOTE — Telephone Encounter (Signed)
-----   Message from Arnoldo Morale, MD sent at 12/06/2014  8:32 AM EDT ----- Please inform her that her abdominal ultrasound came back unremarkable.

## 2014-12-06 NOTE — Telephone Encounter (Signed)
Nurse called patient, patient verified date of birth. Patient aware of unremarkable abdominal ultrasound. Patient voices understanding and has no questions at this time.

## 2014-12-07 ENCOUNTER — Encounter: Payer: Self-pay | Admitting: Family Medicine

## 2014-12-07 ENCOUNTER — Ambulatory Visit: Payer: Medicare Other | Attending: Family Medicine | Admitting: Family Medicine

## 2014-12-07 VITALS — BP 132/85 | HR 78 | Temp 98.3°F | Resp 18 | Ht 62.0 in | Wt 209.6 lb

## 2014-12-07 DIAGNOSIS — M2660 Temporomandibular joint disorder, unspecified: Secondary | ICD-10-CM | POA: Diagnosis not present

## 2014-12-07 DIAGNOSIS — Z87891 Personal history of nicotine dependence: Secondary | ICD-10-CM | POA: Diagnosis not present

## 2014-12-07 DIAGNOSIS — M26609 Unspecified temporomandibular joint disorder, unspecified side: Secondary | ICD-10-CM | POA: Insufficient documentation

## 2014-12-07 DIAGNOSIS — I2510 Atherosclerotic heart disease of native coronary artery without angina pectoris: Secondary | ICD-10-CM | POA: Diagnosis not present

## 2014-12-07 DIAGNOSIS — E1151 Type 2 diabetes mellitus with diabetic peripheral angiopathy without gangrene: Secondary | ICD-10-CM | POA: Insufficient documentation

## 2014-12-07 DIAGNOSIS — Z794 Long term (current) use of insulin: Secondary | ICD-10-CM | POA: Insufficient documentation

## 2014-12-07 DIAGNOSIS — R490 Dysphonia: Secondary | ICD-10-CM | POA: Diagnosis not present

## 2014-12-07 DIAGNOSIS — E1165 Type 2 diabetes mellitus with hyperglycemia: Secondary | ICD-10-CM | POA: Diagnosis not present

## 2014-12-07 DIAGNOSIS — I10 Essential (primary) hypertension: Secondary | ICD-10-CM | POA: Insufficient documentation

## 2014-12-07 DIAGNOSIS — M25561 Pain in right knee: Secondary | ICD-10-CM | POA: Insufficient documentation

## 2014-12-07 DIAGNOSIS — E1159 Type 2 diabetes mellitus with other circulatory complications: Secondary | ICD-10-CM

## 2014-12-07 DIAGNOSIS — Z9861 Coronary angioplasty status: Secondary | ICD-10-CM

## 2014-12-07 LAB — GLUCOSE, POCT (MANUAL RESULT ENTRY): POC Glucose: 199 mg/dl — AB (ref 70–99)

## 2014-12-07 MED ORDER — ACETAMINOPHEN-CODEINE #3 300-30 MG PO TABS: 1.0000 | ORAL_TABLET | ORAL | Status: DC | PRN

## 2014-12-07 MED ORDER — INSULIN DETEMIR 100 UNIT/ML FLEXPEN: 35.0000 [IU] | PEN_INJECTOR | Freq: Every day | SUBCUTANEOUS | Status: DC

## 2014-12-07 NOTE — Patient Instructions (Signed)
Diabetes Mellitus and Food It is important for you to manage your blood sugar (glucose) level. Your blood glucose level can be greatly affected by what you eat. Eating healthier foods in the appropriate amounts throughout the day at about the same time each day will help you control your blood glucose level. It can also help slow or prevent worsening of your diabetes mellitus. Healthy eating may even help you improve the level of your blood pressure and reach or maintain a healthy weight.  HOW CAN FOOD AFFECT ME? Carbohydrates Carbohydrates affect your blood glucose level more than any other type of food. Your dietitian will help you determine how many carbohydrates to eat at each meal and teach you how to count carbohydrates. Counting carbohydrates is important to keep your blood glucose at a healthy level, especially if you are using insulin or taking certain medicines for diabetes mellitus. Alcohol Alcohol can cause sudden decreases in blood glucose (hypoglycemia), especially if you use insulin or take certain medicines for diabetes mellitus. Hypoglycemia can be a life-threatening condition. Symptoms of hypoglycemia (sleepiness, dizziness, and disorientation) are similar to symptoms of having too much alcohol.  If your health care provider has given you approval to drink alcohol, do so in moderation and use the following guidelines:  Women should not have more than one drink per day, and men should not have more than two drinks per day. One drink is equal to:  12 oz of beer.  5 oz of wine.  1 oz of hard liquor.  Do not drink on an empty stomach.  Keep yourself hydrated. Have water, diet soda, or unsweetened iced tea.  Regular soda, juice, and other mixers might contain a lot of carbohydrates and should be counted. WHAT FOODS ARE NOT RECOMMENDED? As you make food choices, it is important to remember that all foods are not the same. Some foods have fewer nutrients per serving than other  foods, even though they might have the same number of calories or carbohydrates. It is difficult to get your body what it needs when you eat foods with fewer nutrients. Examples of foods that you should avoid that are high in calories and carbohydrates but low in nutrients include:  Trans fats (most processed foods list trans fats on the Nutrition Facts label).  Regular soda.  Juice.  Candy.  Sweets, such as cake, pie, doughnuts, and cookies.  Fried foods. WHAT FOODS CAN I EAT? Have nutrient-rich foods, which will nourish your body and keep you healthy. The food you should eat also will depend on several factors, including:  The calories you need.  The medicines you take.  Your weight.  Your blood glucose level.  Your blood pressure level.  Your cholesterol level. You also should eat a variety of foods, including:  Protein, such as meat, poultry, fish, tofu, nuts, and seeds (lean animal proteins are best).  Fruits.  Vegetables.  Dairy products, such as milk, cheese, and yogurt (low fat is best).  Breads, grains, pasta, cereal, rice, and beans.  Fats such as olive oil, trans fat-free margarine, canola oil, avocado, and olives. DOES EVERYONE WITH DIABETES MELLITUS HAVE THE SAME MEAL PLAN? Because every person with diabetes mellitus is different, there is not one meal plan that works for everyone. It is very important that you meet with a dietitian who will help you create a meal plan that is just right for you. Document Released: 03/14/2005 Document Revised: 06/22/2013 Document Reviewed: 05/14/2013 Exit[employer] Patient Information 2015 [employer]. This   information is not intended to replace advice given to you by your health care provider. Make sure you discuss any questions you have with your health care provider.  

## 2014-12-07 NOTE — Progress Notes (Signed)
Quick Note:  Labs addressed at office as well as medications and patient was made aware. ______ 

## 2014-12-07 NOTE — Progress Notes (Signed)
Subjective:    Patient ID: Jacqueline Munoz, female    DOB: 09/09/1960, 54 y.o.   MRN: [id number]  HPI  Admit date: 12/02/14 Discharge date: 12/03/14   Jacqueline Munoz is a 54 year old female with history of Hypertension, Type 2 DM, hypothyroidism depression and Anxiety, CAD s/p stent placement on 11/16/14 who presented to the ED with chest pain; she had also complained of right TMJ pain.  She was recently hospitalized between 5/17-5/20/16 when she underwent a cardiac cath on 11/16/14 with findings of severe 3 vessel obstructive coronary artery disease-stenting of the mid LAD done with 2 DES stents, stenting of bifurcation of first OM and mid to distal left circumflex and she had been on dual antiplatelet therapy with Brilinta and ASA.  Patient underwent a repeat cardiac catheterization which showed a mid LAD lesion, 99% stenosed proximal to the previously placed overlapping stents, and a third overlapping drug-eluting stent was placed. Cath also showed widely patent stent in circumflex-OM bifurcation and mid LAD but upstream LAD 99% focal stenosis, known proximal RCA to mid RCA lesion, 100%stenosis, normal LVEDP. She was monitored overnight and reported no further chest pain; she was discharged and advised to follow-up with cardiology and also to continue dual therapy for at least1 year.   Interval History: She complains of left-sided chest pain beneath her breast which is 7 out of 10 and is unrelated to activity. She also has right-sided jaw pain which is worse when she speaks or chews. Denies shortness of breath, orthopnea, paroxysmal nocturnal dyspnea. She also has some hoarseness of voice which she has had for a few months,; however right knee hurts more so with going up and down the status stairs (xray revealed moderate some effusion) Blood sugar log reviewed indicate fasting sugars in the 170s and random sugars in the 160s which shows some improvement after her Levemir was increased from 25 to 35 at  her last office visit.    Past Medical History  Diagnosis Date  . Fibromyalgia   . Tobacco abuse   . Hypothyroid   . Family history of adverse reaction to anesthesia     "mother died twice during surgery; they brought her back both times" (11/15/2014)  . Sleep apnea     "dx'd; don't wear a mask" (11/15/2014)  . History of stomach ulcers 1981  . Migraine     "couple/month" (11/15/2014)  . Headache     "@ least weekly" (11/15/2014)  . Arthritis     "hips, ankles, knees, hands, back" (11/15/2014)  . Chronic back pain     "all over"  . Anxiety     "social"  . Chronic depression   . Kidney stones     "I've had several"  . Poorly controlled type 2 diabetes mellitus with circulatory disorder 11/15/2014  . Essential hypertension 11/15/2014  . Hyperlipidemia associated with type 2 diabetes mellitus 11/15/2014  . NSTEMI (non-ST elevated myocardial infarction) 11/15/2014  . CAD S/P percutaneous coronary angioplasty 11/16/2014    Mid LAD to Dist LAD lesion, 90% -->0% Post PCI - 2 overlap DES ( Promus Premier DES 3.0 x 16 - 2.75  x 16), Mid Cx to Dist Cx lesion, 95%--> 0% post DES PCI (Synergy DES 2.25 x 38 -mini-crushed @ Ostial OM1 , Ostial-prox OM1 90% -> 0% post DES PCI (Synergy DES 2.75 x 24 -> 3.0 mm) - bifurcation minicrush of mCx stent  . Coronary artery chronic total occlusion: 100% RCA CTO 11/16/2014    Prox RCA  to Mid RCA lesion, 100% stenosed.   . Type IVa MI - Peri-PCI 11/17/2014    Prolonged ischemia during bifurcation PCI of  m-d Cx-OM1 with minicrush technique    Past Surgical History  Procedure Laterality Date  . Cesarean section  2000  . Inner ear surgery Bilateral "several"  . Tonsillectomy    . Total abdominal hysterectomy  ~ 2008  . Tubal ligation  2000  . Cardiac catheterization N/A 11/16/2014    Procedure: Left Heart Cath and Coronary Angiography;  Surgeon: Peter M Martinique, MD;  Location: [facility];  Service: Cardiovascular;  Laterality: N/A;  . Cardiac  catheterization  11/16/2014    Procedure: Coronary Stent Intervention;  Surgeon: Peter M Martinique, MD;  Location: [facility];  Service: Cardiovascular;;  . Cardiac catheterization N/A 12/02/2014    Procedure: Left Heart Cath and Coronary Angiography;  Surgeon: Leonie Man, MD;  Location: [facility];  Service: Cardiovascular;  Laterality: N/A;  . Cardiac catheterization N/A 12/02/2014    Procedure: Coronary Stent Intervention;  Surgeon: Leonie Man, MD;  Location: [facility];  Service: Cardiovascular;  Laterality: N/A;    History   Social History  . Marital Status: Widowed    Spouse Name: N/A  . Number of Children: N/A  . Years of Education: N/A   Occupational History  . Not on file.   Social History Main Topics  . Smoking status: Former Smoker -- 0.00 packs/day for 36 years    Types: Cigarettes    Quit date: 11/15/2014  . Smokeless tobacco: Never Used  . Alcohol Use: No     Comment: 11/15/2014 "might have a few drinks/yr"  . Drug Use: Yes    Special: Marijuana     Comment: last marijuana use 12/06/14.  [name] Sexual Activity: Not on file   Other Topics Concern  . Not on file   Social History Narrative    Allergies  Allergen Reactions  . Corticosteroids Anaphylaxis    ALL steroids.     . Penicillins Anaphylaxis    Current Outpatient Prescriptions on File Prior to Visit  Medication Sig Dispense Refill  . aspirin EC 81 MG EC tablet Take 1 tablet (81 mg total) by mouth daily. 30 tablet 0  . atorvastatin (LIPITOR) 80 MG tablet Take 1 tablet (80 mg total) by mouth daily at 6 PM. 30 tablet 0  . carvedilol (COREG) 3.125 MG tablet Take 1 tablet (3.125 mg total) by mouth 2 (two) times daily with a meal. 60 tablet 0  . glucose monitoring kit (FREESTYLE) monitoring kit 1 each by Does not apply route 4 (four) times daily - after meals and at bedtime. 1 month Diabetic Testing Supplies for QAC-QHS accuchecks. 1 each 1  . ibuprofen (ADVIL,MOTRIN) 200 MG tablet Take  800 mg by mouth every 8 (eight) hours as needed for mild pain or moderate pain.    [name] insulin aspart (NOVOLOG FLEXPEN) 100 UNIT/ML FlexPen Inject 2-15 Units into the skin 3 (three) times daily with meals. Correction coverage: Moderate (average weight, post-op)     CBG < 70: implement hypoglycemia protocol    CBG 70 - 120: 0 units    CBG 121 - 150: 2 units    CBG 151 - 200: 3 units    CBG 201 - 250: 5 units    CBG 251 - 300: 8 units    CBG 301 - 350: 11 units    CBG 351 - 400: 15  units    CBG > 400 call MD and obtain STAT lab verification    15 mL 11  . levothyroxine (SYNTHROID, LEVOTHROID) 200 MCG tablet Take 1 tablet (200 mcg total) by mouth daily before breakfast. 30 tablet 0  . lisinopril (PRINIVIL,ZESTRIL) 10 MG tablet Take 1 tablet (10 mg total) by mouth daily. 30 tablet 0  . metFORMIN (GLUCOPHAGE) 500 MG tablet Take 1 tablet (500 mg total) by mouth 2 (two) times daily with a meal. 60 tablet 0  . NEEDLE, DISP, 30 G (BD DISP NEEDLES) 30G X 1/2" MISC 1 each by Does not apply route 3 (three) times daily before meals. 100 each 3  . nicotine (NICODERM CQ - DOSED IN MG/24 HOURS) 21 mg/24hr patch Place 21 mg onto the skin daily.    . nitroGLYCERIN (NITROSTAT) 0.4 MG SL tablet Place 1 tablet (0.4 mg total) under the tongue every 5 (five) minutes as needed for chest pain. 50 tablet 3  . oxyCODONE-acetaminophen (ROXICET) 5-325 MG per tablet Take 1 tablet by mouth every 6 (six) hours as needed for severe pain. 20 tablet 0  . ticagrelor (BRILINTA) 90 MG TABS tablet Take 1 tablet (90 mg total) by mouth 2 (two) times daily. 60 tablet 0  . furosemide (LASIX) 20 MG tablet Take 0.5 tablets (10 mg total) by mouth daily. Take for 3 days for swelling. (Patient not taking: Reported on 12/02/2014) 4 tablet 0   No current facility-administered medications on file prior to visit.     Review of Systems  Constitutional: Negative for activity change, appetite change and fatigue.  HENT:  Negative for congestion, sinus pressure and sore throat.   Eyes: Negative for visual disturbance.  Respiratory: Negative for cough, chest tightness, shortness of breath and wheezing.   Cardiovascular: Positive for chest pain. Negative for palpitations.  Gastrointestinal: Negative for abdominal pain, constipation and abdominal distention.  Endocrine: Negative for polydipsia.  Genitourinary: Negative for dysuria and frequency.  Musculoskeletal: Positive for joint swelling. Negative for back pain and arthralgias.  Skin: Negative for rash.  Neurological: Negative for tremors, light-headedness and numbness.  Hematological: Does not bruise/bleed easily.  Psychiatric/Behavioral: Negative for suicidal ideas, behavioral problems and agitation.       Positive for depression and anxiety          Objective: Filed Vitals:   12/07/14 1543  BP: 132/85  Pulse: 78  Temp: 98.3 F (36.8 C)  TempSrc: Oral  Resp: 18  Height: $Remove'5\' 2"'WxmkprU$  (1.575 m)  Weight: 209 lb 9.6 oz (95.074 kg)  SpO2: 99%      Physical Exam  Constitutional: She is oriented to person, place, and time. She appears well-developed and well-nourished. No distress.  HENT:  Head: Normocephalic.  Right Ear: External ear normal.  Left Ear: External ear normal.  Nose: Nose normal.  Mouth/Throat: Oropharynx is clear and moist.  Eyes: Conjunctivae and EOM are normal. Pupils are equal, round, and reactive to light.  Neck: Normal range of motion. No JVD present.  Cardiovascular: Normal rate, regular rhythm, normal heart sounds and intact distal pulses.  Exam reveals no gallop.   No murmur heard. Pulmonary/Chest: Effort normal and breath sounds normal. No respiratory distress. She has no wheezes. She has no rales. She exhibits tenderness.  Reproducible chest pain on palpation of left chest wall especially at costochondral junction.  Abdominal: Soft. Bowel sounds are normal. She exhibits no distension and no mass. There is no tenderness.    Musculoskeletal:  Right knee with mild edema,  tenderness to palpation anterioly and on ROM Left knee: normal.  Neurological: She is alert and oriented to person, place, and time. She has normal reflexes.  Skin: Skin is warm and dry. She is not diaphoretic.  Slight bruising at the site of cardiac cath at the R wrist.  Psychiatric: She has a normal mood and affect.   PHQ 9 score 18, GAD 7 score 17       Assessment & Plan:  54 year old female with a history of uncontrolled Type 2 DM, HTN, uncontrolled Hypothyroidism , Depression and anxiety, severe three vessel CAD s/p stent placement 3 weeks ago and again on 12/03/14.      Severe three-vessel CAD: Status post stents. Continue Brilinta and aspirin. Advised to keep appointment with cardiology scheduled for 12/22/14  Depression and anxiety: At her last visit we had discussed placing her on Prozac but she stated will not treat her anxiety as she took Ativan in the past and so I had refered to psychiatry.  Hypothyroidism: Uncontrolled. Continue Synthroid; will need thyroid function test in 3 weeks.  Type 2 Diabetes Mellitus: Uncontrolled with Hba1c of 12.5 but recent blood sugar log review indicates some improvement Continue current regimen. Will address preventive Diabetic and HCM at next OV    Right knee pain and effusion: Unable to place her on steroids due to uncontrolled DM. Continue Tylenol #3 Knee brace applied, apply ice, elevate knee. Will consider Ortho referral if symptoms persist    Voice hoarseness-  ENT referral at next OV  This note has been created with Dragon speech recognition software and Engineer, materials. Any transcriptional errors are unintentional.

## 2014-12-07 NOTE — Progress Notes (Signed)
Patient here for follow up and to establish care with PCP.  She states she is not feeling better.  She had a heart attack about 3 weeks ago and has 5 stents in her heart.  Patient complains of pain in chest under breast that radiates across.  Pain is 7/10 described as aching and discomfort.  Patient also complains of left knee pain from fall before heart attack that is sore and stiff, 7/10.  Pain in right posterior jaw at 9/10 that throbs and has shooting pains.  Patient states it's "difficult to eat/talk."

## 2014-12-08 ENCOUNTER — Telehealth: Payer: Self-pay | Admitting: *Deleted

## 2014-12-08 NOTE — Telephone Encounter (Signed)
LATE ENTRY   FAXED ON 12/06/14 - SIGNED CARDIAC REHAB PHASE II  PER DR [place].

## 2014-12-13 ENCOUNTER — Ambulatory Visit: Payer: Medicare Other

## 2014-12-14 ENCOUNTER — Ambulatory Visit: Payer: Medicare Other | Attending: Family Medicine | Admitting: Family Medicine

## 2014-12-14 ENCOUNTER — Encounter: Payer: Self-pay | Admitting: Family Medicine

## 2014-12-14 VITALS — BP 110/78 | HR 79 | Temp 98.3°F | Resp 18 | Ht 62.0 in | Wt 197.6 lb

## 2014-12-14 DIAGNOSIS — E1165 Type 2 diabetes mellitus with hyperglycemia: Secondary | ICD-10-CM

## 2014-12-14 DIAGNOSIS — F329 Major depressive disorder, single episode, unspecified: Secondary | ICD-10-CM

## 2014-12-14 DIAGNOSIS — F32A Depression, unspecified: Secondary | ICD-10-CM

## 2014-12-14 DIAGNOSIS — E1151 Type 2 diabetes mellitus with diabetic peripheral angiopathy without gangrene: Secondary | ICD-10-CM | POA: Diagnosis not present

## 2014-12-14 DIAGNOSIS — E1159 Type 2 diabetes mellitus with other circulatory complications: Secondary | ICD-10-CM

## 2014-12-14 DIAGNOSIS — Z9861 Coronary angioplasty status: Secondary | ICD-10-CM | POA: Diagnosis not present

## 2014-12-14 DIAGNOSIS — F418 Other specified anxiety disorders: Secondary | ICD-10-CM

## 2014-12-14 DIAGNOSIS — I10 Essential (primary) hypertension: Secondary | ICD-10-CM

## 2014-12-14 DIAGNOSIS — I2510 Atherosclerotic heart disease of native coronary artery without angina pectoris: Secondary | ICD-10-CM

## 2014-12-14 DIAGNOSIS — R490 Dysphonia: Secondary | ICD-10-CM | POA: Insufficient documentation

## 2014-12-14 DIAGNOSIS — F419 Anxiety disorder, unspecified: Secondary | ICD-10-CM

## 2014-12-14 DIAGNOSIS — E039 Hypothyroidism, unspecified: Secondary | ICD-10-CM

## 2014-12-14 DIAGNOSIS — M25561 Pain in right knee: Secondary | ICD-10-CM | POA: Diagnosis not present

## 2014-12-14 LAB — GLUCOSE, POCT (MANUAL RESULT ENTRY): POC GLUCOSE: 117 mg/dL — AB (ref 70–99)

## 2014-12-14 MED ORDER — NITROGLYCERIN 0.4 MG SL SUBL: 0.4000 mg | SUBLINGUAL_TABLET | SUBLINGUAL | Status: DC | PRN

## 2014-12-14 MED ORDER — ATORVASTATIN CALCIUM 80 MG PO TABS: 80.0000 mg | ORAL_TABLET | Freq: Every day | ORAL | Status: DC

## 2014-12-14 MED ORDER — ACETAMINOPHEN-CODEINE #3 300-30 MG PO TABS: 1.0000 | ORAL_TABLET | Freq: Four times a day (QID) | ORAL | Status: DC | PRN

## 2014-12-14 MED ORDER — LEVOTHYROXINE SODIUM 200 MCG PO TABS: 200.0000 ug | ORAL_TABLET | Freq: Every day | ORAL | Status: DC

## 2014-12-14 MED ORDER — CARVEDILOL 3.125 MG PO TABS: 3.1250 mg | ORAL_TABLET | Freq: Two times a day (BID) | ORAL | Status: DC

## 2014-12-14 MED ORDER — TICAGRELOR 90 MG PO TABS: 90.0000 mg | ORAL_TABLET | Freq: Two times a day (BID) | ORAL | Status: DC

## 2014-12-14 MED ORDER — INSULIN ASPART 100 UNIT/ML FLEXPEN: 2.0000 [IU] | PEN_INJECTOR | Freq: Three times a day (TID) | SUBCUTANEOUS | Status: DC

## 2014-12-14 MED ORDER — LISINOPRIL 10 MG PO TABS: 10.0000 mg | ORAL_TABLET | Freq: Every day | ORAL | Status: DC

## 2014-12-14 MED ORDER — METFORMIN HCL 500 MG PO TABS: 500.0000 mg | ORAL_TABLET | Freq: Two times a day (BID) | ORAL | Status: DC

## 2014-12-14 MED ORDER — INSULIN DETEMIR 100 UNIT/ML FLEXPEN: 35.0000 [IU] | PEN_INJECTOR | Freq: Every day | SUBCUTANEOUS | Status: DC

## 2014-12-14 NOTE — Progress Notes (Signed)
BG=117; patient ate about 30 minutes ago  Patient here for follow-up on right knee pain. Patient would like to establish care with a PCP. Patient mentioned about getting a referral to an orthopaedic dr for her knee. Patient was given a knee brace and she has been using it however she cannot keep it on long because it makes her knee start to hurt and she cannot drive well with it on. Patient reports having pain at a rate of 7 located in her knee and back (due to fibromyalgia). Patient reports the pain is aching and sharp shooting. Pain is constant and knee pain started around 11/08/14. Patient states that if she takes two tylenol #3 tablets it helps the pain.   Patient had MI last month and a fall. Patient also reports having a fall this Saturday in the bath tub. Patient shoulder and bottom is sore from the fall and has a knot located on the crown of her head.   Patient needs refill on tylenol #3, aspirin, lipitor, coreg,levemir, levothyroxine, lisinopril, metformin, needles, and ticagrelor.

## 2014-12-14 NOTE — Patient Instructions (Signed)

## 2014-12-14 NOTE — Progress Notes (Signed)
Subjective:    Patient ID: Jacqueline Munoz, female    DOB: 1961/04/13, 54 y.o.   MRN: [id number]  HPI  Jacqueline Munoz is a 54 year old female with history of Hypertension, Type 2 DM (Hba1c 12.5 in 10/2014), uncontrolled hypothyroidism, depression and Anxiety, severe three vessel CAD s/p DES stent placement on 11/16/14 and again on 12/03/14 (currently on dual antiplatelet therapy) here for a follow up visit.  She had complained of right knee pain at her last visit which she sustained from a fall a little over 3 weeks ago and received a knee brace for it along with Tylenol #3. Knee xray revealed some effusion. Pain is worse with weight bearing and she states the knee brace makes it worse. She also complained of voice hoarseness which she wanted evaluated but had denied weight loss. She quit smoking not long ago.  Past Medical History  Diagnosis Date  . Fibromyalgia   . Tobacco abuse   . Hypothyroid   . Family history of adverse reaction to anesthesia     "mother died twice during surgery; they brought her back both times" (11/15/2014)  . Sleep apnea     "dx'd; don't wear a mask" (11/15/2014)  . History of stomach ulcers 1981  . Migraine     "couple/month" (11/15/2014)  . Headache     "@ least weekly" (11/15/2014)  . Arthritis     "hips, ankles, knees, hands, back" (11/15/2014)  . Chronic back pain     "all over"  . Anxiety     "social"  . Chronic depression   . Kidney stones     "I've had several"  . Poorly controlled type 2 diabetes mellitus with circulatory disorder 11/15/2014  . Essential hypertension 11/15/2014  . Hyperlipidemia associated with type 2 diabetes mellitus 11/15/2014  . NSTEMI (non-ST elevated myocardial infarction) 11/15/2014  . CAD S/P percutaneous coronary angioplasty 11/16/2014    Mid LAD to Dist LAD lesion, 90% -->0% Post PCI - 2 overlap DES ( Promus Premier DES 3.0 x 16 - 2.75  x 16), Mid Cx to Dist Cx lesion, 95%--> 0% post DES PCI (Synergy DES 2.25 x 38 -mini-crushed @  Ostial OM1 , Ostial-prox OM1 90% -> 0% post DES PCI (Synergy DES 2.75 x 24 -> 3.0 mm) - bifurcation minicrush of mCx stent  . Coronary artery chronic total occlusion: 100% RCA CTO 11/16/2014    Prox RCA to Mid RCA lesion, 100% stenosed.   . Type IVa MI - Peri-PCI 11/17/2014    Prolonged ischemia during bifurcation PCI of  m-d Cx-OM1 with minicrush technique     Past Surgical History  Procedure Laterality Date  . Cesarean section  2000  . Inner ear surgery Bilateral "several"  . Tonsillectomy    . Total abdominal hysterectomy  ~ 2008  . Tubal ligation  2000  . Cardiac catheterization N/A 11/16/2014    Procedure: Left Heart Cath and Coronary Angiography;  Surgeon: Peter M Martinique, MD;  Location: [facility];  Service: Cardiovascular;  Laterality: N/A;  . Cardiac catheterization  11/16/2014    Procedure: Coronary Stent Intervention;  Surgeon: Peter M Martinique, MD;  Location: [facility];  Service: Cardiovascular;;  . Cardiac catheterization N/A 12/02/2014    Procedure: Left Heart Cath and Coronary Angiography;  Surgeon: Leonie Man, MD;  Location: [facility];  Service: Cardiovascular;  Laterality: N/A;  . Cardiac catheterization N/A 12/02/2014    Procedure: Coronary Stent Intervention;  Surgeon: Shanon Brow  Loren Racer, MD;  Location: [facility];  Service: Cardiovascular;  Laterality: N/A;    History   Social History  . Marital Status: Widowed    Spouse Name: N/A  . Number of Children: N/A  . Years of Education: N/A   Occupational History  . Not on file.   Social History Main Topics  . Smoking status: Former Smoker -- 0.00 packs/day for 36 years    Types: Cigarettes    Quit date: 11/15/2014  . Smokeless tobacco: Never Used  . Alcohol Use: No     Comment: 11/15/2014 "might have a few drinks/yr"  . Drug Use: Yes    Special: Marijuana     Comment: last marijuana use 12/06/14.  [name] Sexual Activity: Not on file   Other Topics Concern  . Not on file   Social  History Narrative    Allergies  Allergen Reactions  . Corticosteroids Anaphylaxis    ALL steroids.     . Penicillins Anaphylaxis    Current Outpatient Prescriptions on File Prior to Visit  Medication Sig Dispense Refill  . aspirin EC 81 MG EC tablet Take 1 tablet (81 mg total) by mouth daily. 30 tablet 0  . glucose monitoring kit (FREESTYLE) monitoring kit 1 each by Does not apply route 4 (four) times daily - after meals and at bedtime. 1 month Diabetic Testing Supplies for QAC-QHS accuchecks. 1 each 1  . ibuprofen (ADVIL,MOTRIN) 200 MG tablet Take 800 mg by mouth every 8 (eight) hours as needed for mild pain or moderate pain.    [name] NEEDLE, DISP, 30 G (BD DISP NEEDLES) 30G X 1/2" MISC 1 each by Does not apply route 3 (three) times daily before meals. 100 each 3  . nicotine (NICODERM CQ - DOSED IN MG/24 HOURS) 21 mg/24hr patch Place 21 mg onto the skin daily.    . furosemide (LASIX) 20 MG tablet Take 0.5 tablets (10 mg total) by mouth daily. Take for 3 days for swelling. (Patient not taking: Reported on 12/02/2014) 4 tablet 0  . oxyCODONE-acetaminophen (ROXICET) 5-325 MG per tablet Take 1 tablet by mouth every 6 (six) hours as needed for severe pain. (Patient not taking: Reported on 12/14/2014) 20 tablet 0   No current facility-administered medications on file prior to visit.        Review of Systems  General: negative for fever, weight loss, appetite change Eyes: no visual symptoms. ENT: no ear symptoms, no sinus tenderness, no nasal congestion or sore throat, positive for voice hoarseness. Neck: no pain  Respiratory: no wheezing, shortness of breath, cough Cardiovascular: no chest pain, no dyspnea on exertion, no pedal edema, no orthopnea. Gastrointestinal: no abdominal pain, no diarrhea, no constipation Genito-Urinary: no urinary frequency, no dysuria, no polyuria. Hematologic: no bruising Endocrine: no cold or heat intolerance Neurological: no headaches, no seizures, no  tremors Musculoskeletal: see hpi Skin: no pruritus, no rash. Psychological: positive for depression and anxiety, no suicidal ideation      Objective: Filed Vitals:   12/14/14 1436  BP: 110/78  Pulse: 79  Temp: 98.3 F (36.8 C)  TempSrc: Oral  Resp: 18  Height: $Remove'5\' 2"'oEZzexh$  (1.575 m)  Weight: 197 lb 9.6 oz (89.631 kg)  SpO2: 98%      Physical Exam  Constitutional: normal appearing,  Eyes: PERRLA HEENT: Head is atraumatic, normal sinuses, normal oropharynx, normal appearing tonsils and palate, tympanic membrane is normal bilaterally. Neck: normal range of motion, no thyromegaly, no JVD Cardiovascular: normal rate and rhythm,  normal heart sounds, no murmurs, rub or gallop, no pedal edema Respiratory: clear to auscultation bilaterally, no wheezes, no rales, no rhonchi Abdomen: soft, not tender to palpation, normal bowel sounds, no enlarged organs Extremities: no edema noted in knees, restricted ROM and mild tenderness in R knee.           EXAM: RIGHT KNEE - COMPLETE 4+ VIEW  COMPARISON: None.  FINDINGS: There is no evidence of fracture or dislocation. There is a moderate joint effusion. There is no evidence of arthropathy or other focal bone abnormality. Soft tissues are unremarkable.  IMPRESSION: No acute osseous injury of the right knee. Moderate nonspecific joint effusion.   Electronically Signed  By: Kathreen Devoid  On: 11/15/2014 16:05       Assessment & Plan:  54 year old female with a history of uncontrolled Type 2 DM, HTN, uncontrolled Hypothyroidism , Depression and anxiety, severe three vessel CAD s/p stent placement 3 weeks ago and again on 12/03/14.      Severe three-vessel CAD: Status post stents. Continue Brilinta and aspirin. Advised to keep appointment with cardiology scheduled for 12/22/14  Depression and anxiety: At her last visit we had discussed placing her on Prozac but she stated will not treat her anxiety as she took Ativan in the  past and so I had refered to psychiatry.  Hypothyroidism: Uncontrolled. Continue Synthroid; will need thyroid function test in 3 weeks.  Type 2 Diabetes Mellitus: Uncontrolled with Hba1c of 12.5 but recent blood sugar log review indicates some improvement Continue current regimen. Preventive Diabetic and HCM with PCP   Right knee pain and effusion: Unable to place her on steroids due to uncontrolled DM. Continue Tylenol #3 Knee brace applied, apply ice, elevate knee. Knee MRI ordered but she is claustrophobic and is needing sedation which I am holding off on due to her cardiac risk.    Voice hoarseness-  Likely from Uncontrolled Hypothyroidism and if it still persists after TSH levels have normalized ENT referral will be warranted to exclude malignancy.  This note has been created with Surveyor, quantity. Any transcriptional errors are unintentional.     Recommendation for PCP: Needs thyroid labs at next visit Follow up on right knee pain,

## 2014-12-15 ENCOUNTER — Ambulatory Visit: Payer: Medicare Other | Attending: Internal Medicine

## 2014-12-15 ENCOUNTER — Telehealth: Payer: Self-pay | Admitting: Cardiology

## 2014-12-15 NOTE — Telephone Encounter (Signed)
Pt called in stating that she will be running out of Brilinta on Monday and she says that Dr.Harding mentioned a cheaper medication that she could take the place of the Brilinta. She would like to speak to the nurse about getting a prescription for that medication. Please call  Thanks

## 2014-12-15 NOTE — Progress Notes (Signed)
Quick Note:  Labs addressed at office as well as medications and patient was made aware. ______ 

## 2014-12-15 NOTE — Telephone Encounter (Signed)
Pt seeking alternative to Brilinta - will defer to Dr. Ellyn Hack for advice/recommendation.

## 2014-12-16 MED ORDER — TICAGRELOR 90 MG PO TABS: 90.0000 mg | ORAL_TABLET | Freq: Two times a day (BID) | ORAL | Status: DC

## 2014-12-16 NOTE — Telephone Encounter (Signed)
Pt would like some samples of Brilinta please.

## 2014-12-16 NOTE — Telephone Encounter (Signed)
Patient aware samples are at the front desk for pick up  

## 2014-12-19 ENCOUNTER — Telehealth: Payer: Self-pay | Admitting: Clinical

## 2014-12-19 MED ORDER — CLOPIDOGREL BISULFATE 75 MG PO TABS
75.0000 mg | ORAL_TABLET | Freq: Every day | ORAL | Status: DC
Start: 2014-12-19 — End: 2015-01-05

## 2014-12-19 NOTE — Telephone Encounter (Signed)
F/U w pt; she lost information about [facility] at last hospital visit; pt now says she will go to [facility] walk-in clinic on 12-22-14

## 2014-12-19 NOTE — Addendum Note (Signed)
Addended by: Theodore Demark on: 12/19/2014 04:19 PM   Modules accepted: Orders

## 2014-12-19 NOTE — Telephone Encounter (Signed)
OK to convert to Plavix 75 mg daily -- after last day of Brilinta - take 300 mg Plavix the following day, then 75 mg daily.  [place]

## 2014-12-19 NOTE — Telephone Encounter (Signed)
Called patient gave instruction on new medication (Plavix) w/ instructions on discontinuing Brilinta and initiating Plavix w/ loading dose.  Pt voiced understanding of instruction.

## 2014-12-22 ENCOUNTER — Ambulatory Visit (INDEPENDENT_AMBULATORY_CARE_PROVIDER_SITE_OTHER): Payer: Medicare Other | Admitting: Cardiology

## 2014-12-22 ENCOUNTER — Encounter: Payer: Self-pay | Admitting: Cardiology

## 2014-12-22 ENCOUNTER — Telehealth: Payer: Self-pay | Admitting: *Deleted

## 2014-12-22 VITALS — BP 130/76 | HR 82 | Ht 62.0 in | Wt 200.0 lb

## 2014-12-22 DIAGNOSIS — Z9861 Coronary angioplasty status: Secondary | ICD-10-CM

## 2014-12-22 DIAGNOSIS — I10 Essential (primary) hypertension: Secondary | ICD-10-CM

## 2014-12-22 DIAGNOSIS — Z720 Tobacco use: Secondary | ICD-10-CM

## 2014-12-22 DIAGNOSIS — I209 Angina pectoris, unspecified: Secondary | ICD-10-CM

## 2014-12-22 DIAGNOSIS — E1159 Type 2 diabetes mellitus with other circulatory complications: Secondary | ICD-10-CM

## 2014-12-22 DIAGNOSIS — I214 Non-ST elevation (NSTEMI) myocardial infarction: Secondary | ICD-10-CM | POA: Diagnosis not present

## 2014-12-22 DIAGNOSIS — Z79899 Other long term (current) drug therapy: Secondary | ICD-10-CM

## 2014-12-22 DIAGNOSIS — F17210 Nicotine dependence, cigarettes, uncomplicated: Secondary | ICD-10-CM

## 2014-12-22 DIAGNOSIS — I2510 Atherosclerotic heart disease of native coronary artery without angina pectoris: Secondary | ICD-10-CM | POA: Diagnosis not present

## 2014-12-22 DIAGNOSIS — E1151 Type 2 diabetes mellitus with diabetic peripheral angiopathy without gangrene: Secondary | ICD-10-CM

## 2014-12-22 DIAGNOSIS — I208 Other forms of angina pectoris: Secondary | ICD-10-CM

## 2014-12-22 DIAGNOSIS — R42 Dizziness and giddiness: Secondary | ICD-10-CM | POA: Insufficient documentation

## 2014-12-22 DIAGNOSIS — E1169 Type 2 diabetes mellitus with other specified complication: Secondary | ICD-10-CM | POA: Diagnosis not present

## 2014-12-22 DIAGNOSIS — E785 Hyperlipidemia, unspecified: Secondary | ICD-10-CM

## 2014-12-22 DIAGNOSIS — E1165 Type 2 diabetes mellitus with hyperglycemia: Secondary | ICD-10-CM

## 2014-12-22 MED ORDER — ISOSORBIDE MONONITRATE ER 30 MG PO TB24: 30.0000 mg | ORAL_TABLET | Freq: Every day | ORAL | Status: DC

## 2014-12-22 MED ORDER — MECLIZINE HCL 25 MG PO TABS: 25.0000 mg | ORAL_TABLET | Freq: Two times a day (BID) | ORAL | Status: DC | PRN

## 2014-12-22 NOTE — Patient Instructions (Signed)
When you change to clopidogrel from [facility].  The first day take 300 mg (4 tablets) then 75 mg daily.  May take meclizine 12.5 mg (1/2 tablet of 25 mg )for twice a day for the first week,then 25 mg twice a day for a month. Stop after that to see if dizziness is better and use if needed for dizziness.  HAVE LABS DONE IN AUG 2016 (LATER PART OF THE MONTH) - NEED TO GO TO [facility]  [facility]------LIPIDS ,[id number]  Your physician wants you to follow-up in [place].  You will receive a reminder letter in the mail two months in advance. If you don't receive a letter, please call our office to schedule the follow-up appointment.

## 2014-12-22 NOTE — Progress Notes (Signed)
PATIENT: Jacqueline Munoz MRN: [id number] DOB: 1960-10-16 PCP: Pcp Not In System - Currently going to [facility] - Seeing Dr. Venetia Night.  Is setting up PCP (working on [facility])  Clinic Note: Chief Complaint  Patient presents with  . POST HOSPITAL FOLLOW UP    Patient has feels light headed, dizzy, SOB, and chest pains at times. and has some swelling in her legs, hands, feet, and ankles. She took a nitro 6/6 and 6/12.  . Coronary Artery Disease    NSTEMI- PCI, Unstable Angina-PCI    HPI: Jacqueline Munoz is a 54 y.o. female with a PMH below who presents today for initial f/u s/p NSTEMI - then relook Cath with PCI for Unstable Angina.   Non-STEMI 11/15/2014: Cath/PCI on the 18th -> PCI to LAD and circumflex-OM bifurcation  Unstable Angina 12/01/2014: Cath/PCI on the 3rd-> PCI to LAD (denovo upstream lesion from recent LAD stents  Cath reports & Echos x2 reviewed & PMH updated.  Interval History: She presents today without any major complaints of anginal type chest pain. She has had some twinging type episodes in her chest, but nothing significant concerning for angina based on her 2 recent encounters. Mostly a sharp twitching pain underneath her left breast. Actually is more in the left upper quadrant is posted chest. Apparently she is mostly ongoing evaluation of her gallbladder with an MRI as there is concern that there could be a gallbladder etiology for her pain.  She has had a couple episodes of feeling lightheaded and dizzy, but not significant. She also has had some gasping dyspnea related to the Brilinta. Minimal edema but no significant heart failure symptoms of PND, orthopnea or gross edema.   The remainder of cardiac review of systems is as follows: Cardiovascular ROS: positive for - edema and Dizziness negative for - irregular heartbeat, loss of consciousness, orthopnea, palpitations, paroxysmal nocturnal dyspnea, rapid heart rate or TIA/amaurosis fugax, syncope/near syncope :   Past  Medical History  Diagnosis Date  . Fibromyalgia   . Tobacco abuse   . Hypothyroid   . Family history of adverse reaction to anesthesia     "mother died twice during surgery; they brought her back both times" (11/15/2014)  . Sleep apnea     "dx'd; don't wear a mask" (11/15/2014)  . History of stomach ulcers 1981  . Migraine     "couple/month" (11/15/2014)  . Headache     "@ least weekly" (11/15/2014)  . Arthritis     "hips, ankles, knees, hands, back" (11/15/2014)  . Chronic back pain     "all over"  . Anxiety     "social"  . Chronic depression   . Kidney stones     "I've had several"  . Poorly controlled type 2 diabetes mellitus with circulatory disorder 11/15/2014  . Essential hypertension 11/15/2014  . Hyperlipidemia associated with type 2 diabetes mellitus 11/15/2014  . NSTEMI (non-ST elevated myocardial infarction) 11/15/2014  . CAD S/P percutaneous coronary angioplasty 5/18/'16 ; 6/3/'16    a) NSTEMI: m-d LAD 90% -->0% Post PCI - 2 overlap DES ( Promus Premier DES 3.0 x 16 - 2.75  x 16), m-dCx 95%--> 0% post DES PCI (Synergy DES 2.25 x 38 -mini-crushed @ Ostial OM1 , Ostial-prox OM1 90% -> 0% post DES PCI (Synergy DES 2.75 x 24 -> 3.0 mm) - bifurcation minicrush of mCx stent;; b) UA/ACS: mLAD upstream - 3rd overlapping Promus DES 3.0 x 20 (3.25 mm)   . Coronary artery chronic total occlusion: 100%  RCA CTO 11/16/2014    Prox RCA to Mid RCA lesion, 100% stenosed.   . Type IVa MI - Peri-PCI 11/17/2014    Prolonged ischemia during bifurcation PCI of  m-d Cx-OM1 with minicrush technique    Prior Cardiac Evaluation and Past Surgical History: Past Surgical History  Procedure Laterality Date  . Cesarean section  2000  . Inner ear surgery Bilateral "several"  . Tonsillectomy    . Total abdominal hysterectomy  ~ 2008  . Tubal ligation  2000  . Cardiac catheterization N/A 11/16/2014    Procedure: Left Heart Cath and Coronary Angiography;  Surgeon: Peter M Martinique, MD;  Location: [place]  [facility];  Service: Cardiovascular;  RCA 100% CTO, m-dLAD 90%, m-dCx 95%, OM1 90%  . Cardiac catheterization  11/16/2014    Procedure: Coronary Stent Intervention;  Surgeon: Peter M Martinique, MD;  Location: [place] [facility];  Service: Cardiovascular;;m-d LAD overlapping 3.0 x 16 & 2.75 x 16 Promus P DES; OM1-dCx Minicrush bifurcation (dCx 2.25 x 38 Synergy DES crushed with 2.75 x 24 Synergy DES in OM1)   . Cardiac catheterization N/A 12/02/2014    Procedure: Left Heart Cath and Coronary Angiography;  Surgeon: Leonie Man, MD;  Location: [place] [facility];  Service: Cardiovascular;  Previous Stents in LAD & Cx-OM patent, mLAD prox to stents 99%  . Cardiac catheterization N/A 12/02/2014    Procedure: Coronary Stent Intervention;  Surgeon: Leonie Man, MD;  Location: [place] [facility];  Service: Cardiovascular;  3rd overlapping (prox) mLAD Promus Premier DES 3.0 x 20 (3.25 mm)  . Transthoracic echocardiogram  5/19 & 6/ 2016    a) EF 65-70%, mild LVH, Gr 1 DD; b) EF 60% -ron RWMA, no valve lesions    Allergies  Allergen Reactions  . Corticosteroids Anaphylaxis    ALL steroids.     . Penicillins Anaphylaxis    Current Outpatient Prescriptions  Medication Sig Dispense Refill  . aspirin EC 81 MG EC tablet Take 1 tablet (81 mg total) by mouth daily. 30 tablet 0  . atorvastatin (LIPITOR) 80 MG tablet Take 1 tablet (80 mg total) by mouth daily at 6 PM. 30 tablet 2  . carvedilol (COREG) 3.125 MG tablet Take 1 tablet (3.125 mg total) by mouth 2 (two) times daily with a meal. 60 tablet 2  . clopidogrel (PLAVIX) 75 MG tablet Take 1 tablet (75 mg total) by mouth daily. First dose day please take $RemoveBef'300mg'XJYOnSwLyV$  (4 pills), then take 1 tablet ($RemoveB'75mg'qheptFNO$ ) daily. 90 tablet 3  . furosemide (LASIX) 20 MG tablet Take 0.5 tablets (10 mg total) by mouth daily. Take for 3 days for swelling. (Patient taking differently: Take 10 mg by mouth daily. Take for 3 days for swelling.) 4 tablet 0  . glucose monitoring kit (FREESTYLE)  monitoring kit 1 each by Does not apply route 4 (four) times daily - after meals and at bedtime. 1 month Diabetic Testing Supplies for QAC-QHS accuchecks. 1 each 1  . ibuprofen (ADVIL,MOTRIN) 200 MG tablet Take 800 mg by mouth every 8 (eight) hours as needed for mild pain or moderate pain.    [name] insulin aspart (NOVOLOG FLEXPEN) 100 UNIT/ML FlexPen Inject 2-15 Units into the skin 3 (three) times daily with meals. Correction coverage: Moderate (average weight, post-op)     CBG < 70: implement hypoglycemia protocol    CBG 70 - 120: 0 units    CBG 121 - 150: 2 units    CBG 151 - 200:  3 units    CBG 201 - 250: 5 units    CBG 251 - 300: 8 units    CBG 301 - 350: 11 units    CBG 351 - 400: 15 units    CBG > 400 call MD and obtain STAT lab verification    15 mL 2  . Insulin Detemir (LEVEMIR FLEXPEN) 100 UNIT/ML Pen Inject 35 Units into the skin at bedtime. 15 mL 2  . levothyroxine (SYNTHROID, LEVOTHROID) 200 MCG tablet Take 1 tablet (200 mcg total) by mouth daily before breakfast. 30 tablet 2  . lisinopril (PRINIVIL,ZESTRIL) 10 MG tablet Take 1 tablet (10 mg total) by mouth daily. 30 tablet 2  . metFORMIN (GLUCOPHAGE) 500 MG tablet Take 1 tablet (500 mg total) by mouth 2 (two) times daily with a meal. 60 tablet 2  . NEEDLE, DISP, 30 G (BD DISP NEEDLES) 30G X 1/2" MISC 1 each by Does not apply route 3 (three) times daily before meals. 100 each 3  . nicotine (NICODERM CQ - DOSED IN MG/24 HOURS) 21 mg/24hr patch Place 21 mg onto the skin daily.    . nitroGLYCERIN (NITROSTAT) 0.4 MG SL tablet Place 1 tablet (0.4 mg total) under the tongue every 5 (five) minutes as needed for chest pain. 50 tablet 2  . ticagrelor (BRILINTA) 90 MG TABS tablet Take 1 tablet (90 mg total) by mouth 2 (two) times daily. 32 tablet 0  . acetaminophen-codeine (TYLENOL #3) 300-30 MG per tablet Take 1-2 tablets by mouth every 6 (six) hours as needed for moderate pain. 60 tablet 0  . isosorbide mononitrate (IMDUR)  30 MG 24 hr tablet Take 1 tablet (30 mg total) by mouth daily. 30 tablet 6  . meclizine (ANTIVERT) 25 MG tablet Take 1 tablet (25 mg total) by mouth 2 (two) times daily as needed for dizziness. 60 tablet 3   No current facility-administered medications for this visit.   SOCIAL and FAMILY HISTORY:  reports that she quit smoking about 6 weeks ago. Her smoking use included Cigarettes. She smoked 0.00 packs per day for 36 years. She has never used smokeless tobacco. She reports that she uses illicit drugs (Marijuana). She reports that she does not drink alcohol.  family history includes Hyperlipidemia in her mother.  ROS: A comprehensive Review of Systems - was performed Review of Systems  Constitutional: Negative for malaise/fatigue.  HENT: Negative for nosebleeds.        Still has the left in her ear issue  Respiratory: Positive for cough (Morning cough is getting less frequent).   Cardiovascular: Negative for claudication.  Gastrointestinal: Positive for heartburn, nausea, abdominal pain and constipation. Negative for diarrhea, blood in stool and melena.  Genitourinary: Negative for hematuria.  Neurological: Positive for dizziness. Negative for headaches.  Endo/Heme/Allergies: Bruises/bleeds easily.  Psychiatric/Behavioral: The patient is nervous/anxious.   All other systems reviewed and are negative.   PHYSICAL EXAM BP 130/76 mmHg  Pulse 82  Ht $R'5\' 2"'mn$  (1.575 m)  Wt 90.719 kg (200 lb)  BMI 36.57 kg/m2 General appearance: alert, cooperative, appears stated age, no distress, moderately obese and Pleasant mood and affect. Answers questions appropriately Neck: no adenopathy, no carotid bruit, no JVD, supple, symmetrical, trachea midline and thyroid not enlarged, symmetric, no tenderness/mass/nodules Lungs: clear to auscultation bilaterally, normal percussion bilaterally and Mild interstitial lung sounds in the bases. But no wheezes rales or rhonchi Heart: regular rate and rhythm, S1, S2  normal, no murmur, click, rub or gallop and normal apical impulse  Abdomen: Mildly obese, soft, mild tenderness in the left upper quadrant and epigastric area with no rebound. Nondistended Extremities: extremities normal, atraumatic, no cyanosis or edema Pulses: 2+ and symmetric Neurologic: Alert and oriented X 3, normal strength and tone. Normal symmetric reflexes. Normal coordination and gait Mental status: Alert, oriented, thought content appropriate Cranial nerves: normal   Adult ECG Report - not performed Recent Labs:  Lab Results  Component Value Date   CHOL 281* 11/16/2014   HDL 33* 11/16/2014   LDLCALC UNABLE TO CALCULATE IF TRIGLYCERIDE OVER 400 mg/dL 11/16/2014   LDLDIRECT 56 12/02/2014   TRIG 561* 11/16/2014   CHOLHDL 8.5 11/16/2014     Chemistry      Component Value Date/Time   NA 138 12/03/2014 0256   K 3.7 12/03/2014 0256   CL 106 12/03/2014 0256   CO2 23 12/03/2014 0256   BUN 6 12/03/2014 0256   CREATININE 0.56 12/03/2014 0256      Component Value Date/Time   CALCIUM 8.4* 12/03/2014 0256   ALKPHOS 50 12/02/2014 0450   AST 15 12/02/2014 0450   ALT 12* 12/02/2014 0450   BILITOT 0.4 12/02/2014 0450      ASSESSMENT / PLAN: 54 year old woman with now significant CAD here for follow-up of a combined double hospitalization for first non-STEMI and then ACS/unstable angina. She was also diagnosed with diabetes that has been difficult to control and is currently in the process of trying to get a primary care physician. She is trying to move herself down to [place] area.  Problem List Items Addressed This Visit    Angina, class III    Class 3/unstable angina that required a relook cath. She is not had any further symptoms of this nature. It was interesting that he had she had a de novo lesion in the LAD upstream from her previous stents. Will need aggressive risk factor modification with high-dose statin as well as beta blocker and ACE inhibitor. She quit smoking at  the time for non-STEMI and is currently trying to get her diabetes under control.      Relevant Medications   isosorbide mononitrate (IMDUR) 30 MG 24 hr tablet   CAD S/P DES PCI to LAD (3) & OM1-dCx bifurcation (minicrush) (Chronic)    Status post extensive PCI with 3 stents in the LAD and a mini crush technique and stenting in the circumflex-OM1. She needs to be on dual antiplatelet therapy for minimum of a year. Unfortunately think she is having some dyspnea issues with the Brilinta as well as cost issues. If we cannot get the Brilinta cost adjustments with her bare coverage, we can switch her to Plavix.   The plan is for her to complete her current prescription of Brilinta that she has. She will then start the following day with 300 mg Plavix then the next day start with daily Plavix. We can check P2 why inhibitor assay shortly thereafter which would be roughly time for her to get her lipid panel checked. I would continue dual therapy  for least one year and then consider stopping aspirin and continuing Plavix indefinitely.  She is currently now on Lipitor, carvedilol and lisinopril which is beneficial for her diabetes.        Relevant Medications   isosorbide mononitrate (IMDUR) 30 MG 24 hr tablet   Other Relevant Orders   Lipid panel   Platelet inhibition p2y12   Comprehensive metabolic panel   Cigarette smoker    Smoking cessation instruction/counseling given:  commended patient for  quitting and reviewed strategies for preventing relapses      Essential hypertension (Chronic)    Excellent control on current medications. No need to titrate up for now. We'll reassess at follow-up and consider titrating the beta blocker up more.      Relevant Medications   isosorbide mononitrate (IMDUR) 30 MG 24 hr tablet   Other Relevant Orders   Lipid panel   Platelet inhibition p2y12   Comprehensive metabolic panel   Hyperlipidemia associated with type 2 diabetes mellitus (Chronic)    Lipids  were adequate control in the hospital with elevated triglycerides. She really should be on fenofibrate along with statin.  She is on so many medicines reluctant to start another one, so we will simply recheck lipids in about a month and a half to 2 months to reassess the triglyceride levels. For now continue on high-dose statin given recent tandem acute coronary syndrome episodes.      Relevant Medications   isosorbide mononitrate (IMDUR) 30 MG 24 hr tablet   Other Relevant Orders   Lipid panel   Platelet inhibition p2y12   Comprehensive metabolic panel   NSTEMI (non-ST elevated myocardial infarction) - Primary (Chronic)    No active anginal or heart failure symptoms. Preserved EF on echo with no regional wall motion modalities.  She had some mild diastolic heart failure symptoms. Is on low-dose Lasix for her mild edema. We'll probably end up using it when necessary only.      Relevant Medications   isosorbide mononitrate (IMDUR) 30 MG 24 hr tablet   Other Relevant Orders   Lipid panel   Platelet inhibition p2y12   Comprehensive metabolic panel   Poorly controlled type 2 diabetes mellitus with circulatory disorder (Chronic)    Has been started on insulin - both long-acting and meal coverage. Also on metformin. Currently being followed by the [facility]. Is trying to establish a permanent primary care physician.      Relevant Medications   isosorbide mononitrate (IMDUR) 30 MG 24 hr tablet   Vertigo, constant (Chronic)    This doesn't sound very hypotensive related as far as symptoms go. Sounds more consistent with positional vertigo or vestibular basal issues. While this is being evaluated by her primary care provider, I will prescribe meclizine.      Relevant Orders   Lipid panel   Platelet inhibition p2y12   Comprehensive metabolic panel    Other Visit Diagnoses    Drug therapy        Relevant Orders    Lipid panel    Platelet inhibition p2y12    Comprehensive  metabolic panel      This is a very complex patient with 2 hospitalizations to review along with 2 echocardiogram reports and to cath reports to review and input into the past medical history. I spent close to 30 minutes with the patient in direct contact and over 30 minutes with the chart.   Meds ordered this encounter  Medications  . meclizine (ANTIVERT) 25 MG tablet    Sig: Take 1 tablet (25 mg total) by mouth 2 (two) times daily as needed for dizziness.    Dispense:  60 tablet    Refill:  3  . isosorbide mononitrate (IMDUR) 30 MG 24 hr tablet    Sig: Take 1 tablet (30 mg total) by mouth daily.    Dispense:  30 tablet    Refill:  6   Patient instructions: Continue Brilinta until current prescription complete: When you change to  clopidogrel from [place].  The first day take 300 mg (4 tablets) then 75 mg daily.  May take meclizine 12.5 mg (1/2 tablet of 25 mg )for twice a day for the first week,then 25 mg twice a day for a month. Stop after that to see if dizziness is better and use if needed for dizziness.  HAVE LABS DONE IN AUG 2016 (LATER PART OF THE MONTH) - NEED TO GO TO [place] [facility]  LAB------LIPIDS ,TPE,L4K98  Followup: 3 months  DAVID W. Ellyn Hack, M.D., M.S. Interventional Cardiolgy [facility]

## 2014-12-22 NOTE — Telephone Encounter (Signed)
Faxed - signed cardiac rehab phase ll orders to [facility]

## 2014-12-23 ENCOUNTER — Ambulatory Visit: Payer: Medicare Other | Admitting: Family Medicine

## 2014-12-26 ENCOUNTER — Other Ambulatory Visit: Payer: Self-pay | Admitting: Family Medicine

## 2014-12-26 ENCOUNTER — Encounter: Payer: Self-pay | Admitting: Family Medicine

## 2014-12-26 ENCOUNTER — Ambulatory Visit: Payer: Medicare Other | Attending: Family Medicine | Admitting: Family Medicine

## 2014-12-26 VITALS — BP 106/73 | HR 87 | Temp 98.3°F | Resp 18 | Ht 62.0 in | Wt 195.2 lb

## 2014-12-26 DIAGNOSIS — E1165 Type 2 diabetes mellitus with hyperglycemia: Secondary | ICD-10-CM | POA: Diagnosis not present

## 2014-12-26 DIAGNOSIS — N63 Unspecified lump in unspecified breast: Secondary | ICD-10-CM

## 2014-12-26 DIAGNOSIS — I2510 Atherosclerotic heart disease of native coronary artery without angina pectoris: Secondary | ICD-10-CM

## 2014-12-26 DIAGNOSIS — Z9861 Coronary angioplasty status: Secondary | ICD-10-CM

## 2014-12-26 LAB — GLUCOSE, POCT (MANUAL RESULT ENTRY): POC Glucose: 202 mg/dl — AB (ref 70–99)

## 2014-12-26 MED ORDER — ACETAMINOPHEN-CODEINE #3 300-30 MG PO TABS: 1.0000 | ORAL_TABLET | Freq: Four times a day (QID) | ORAL | Status: DC | PRN

## 2014-12-26 NOTE — Progress Notes (Signed)
Patient here for lump under left breast. Patient reports that she noticed the lump under her breast last Wednesday during her shower. Pain rated at 9, described as aching and stabbing. Pain radiates out from central location. Pain is constant. Patient reports it hurts to the touch.   Patient is out of Tylenol #3.  Patient CBG is 202.  Patient ate a banana around 12:15 pm. Patient has taken diabetes medication today.

## 2014-12-26 NOTE — Progress Notes (Signed)
Subjective:    Patient ID: Jacqueline Munoz, female    DOB: 09-15-1960, 54 y.o.   MRN: [id number]  HPI 54 year old female with a history of uncontrolled Type 2 DM, HTN, uncontrolled Hypothyroidism, Depression and anxiety, severe three vessel CAD s/p stent placement (followed by cardiology -Dr. Ellyn Hack) who is here with complains of a lump under her left breast for an unknown duration and this is associated with pain.  She denies increase in size ; admit to not having a mammogram in the last 4 years. Denies nipple discharge , no lumps in right breast.   She is requesting a refill of Tylenol No. 3 which she takes for right knee pain.  Past Medical History  Diagnosis Date  . Fibromyalgia   . Tobacco abuse   . Hypothyroid   . Family history of adverse reaction to anesthesia     "mother died twice during surgery; they brought her back both times" (11/15/2014)  . Sleep apnea     "dx'd; don't wear a mask" (11/15/2014)  . History of stomach ulcers 1981  . Migraine     "couple/month" (11/15/2014)  . Headache     "@ least weekly" (11/15/2014)  . Arthritis     "hips, ankles, knees, hands, back" (11/15/2014)  . Chronic back pain     "all over"  . Anxiety     "social"  . Chronic depression   . Kidney stones     "I've had several"  . Poorly controlled type 2 diabetes mellitus with circulatory disorder 11/15/2014  . Essential hypertension 11/15/2014  . Hyperlipidemia associated with type 2 diabetes mellitus 11/15/2014  . NSTEMI (non-ST elevated myocardial infarction) 11/15/2014  . CAD S/P percutaneous coronary angioplasty 11/16/2014    Mid LAD to Dist LAD lesion, 90% -->0% Post PCI - 2 overlap DES ( Promus Premier DES 3.0 x 16 - 2.75  x 16), Mid Cx to Dist Cx lesion, 95%--> 0% post DES PCI (Synergy DES 2.25 x 38 -mini-crushed @ Ostial OM1 , Ostial-prox OM1 90% -> 0% post DES PCI (Synergy DES 2.75 x 24 -> 3.0 mm) - bifurcation minicrush of mCx stent  . Coronary artery chronic total occlusion: 100% RCA CTO  11/16/2014    Prox RCA to Mid RCA lesion, 100% stenosed.   . Type IVa MI - Peri-PCI 11/17/2014    Prolonged ischemia during bifurcation PCI of  m-d Cx-OM1 with minicrush technique    Past Surgical History  Procedure Laterality Date  . Cesarean section  2000  . Inner ear surgery Bilateral "several"  . Tonsillectomy    . Total abdominal hysterectomy  ~ 2008  . Tubal ligation  2000  . Cardiac catheterization N/A 11/16/2014    Procedure: Left Heart Cath and Coronary Angiography;  Surgeon: Peter M Martinique, MD;  Location: [facility];  Service: Cardiovascular;  Laterality: N/A;  . Cardiac catheterization  11/16/2014    Procedure: Coronary Stent Intervention;  Surgeon: Peter M Martinique, MD;  Location: [facility];  Service: Cardiovascular;;  . Cardiac catheterization N/A 12/02/2014    Procedure: Left Heart Cath and Coronary Angiography;  Surgeon: Leonie Man, MD;  Location: [facility];  Service: Cardiovascular;  Laterality: N/A;  . Cardiac catheterization N/A 12/02/2014    Procedure: Coronary Stent Intervention;  Surgeon: Leonie Man, MD;  Location: [facility];  Service: Cardiovascular;  Laterality: N/A;    History   Social History  . Marital Status: Widowed  Spouse Name: N/A  . Number of Children: N/A  . Years of Education: N/A   Occupational History  . Not on file.   Social History Main Topics  . Smoking status: Former Smoker -- 0.00 packs/day for 36 years    Types: Cigarettes    Quit date: 11/15/2014  . Smokeless tobacco: Never Used  . Alcohol Use: No     Comment: 11/15/2014 "might have a few drinks/yr"  . Drug Use: Yes    Special: Marijuana     Comment: last marijuana use 12/25/14  . Sexual Activity: Not on file   Other Topics Concern  . Not on file   Social History Narrative    Family History  Problem Relation Age of Onset  . Hyperlipidemia Mother     Allergies  Allergen Reactions  . Corticosteroids Anaphylaxis    ALL steroids.       . Penicillins Anaphylaxis    Current Outpatient Prescriptions on File Prior to Visit  Medication Sig Dispense Refill  . aspirin EC 81 MG EC tablet Take 1 tablet (81 mg total) by mouth daily. 30 tablet 0  . atorvastatin (LIPITOR) 80 MG tablet Take 1 tablet (80 mg total) by mouth daily at 6 PM. 30 tablet 2  . carvedilol (COREG) 3.125 MG tablet Take 1 tablet (3.125 mg total) by mouth 2 (two) times daily with a meal. 60 tablet 2  . clopidogrel (PLAVIX) 75 MG tablet Take 1 tablet (75 mg total) by mouth daily. First dose day please take 310m (4 pills), then take 1 tablet (773m daily. 90 tablet 3  . furosemide (LASIX) 20 MG tablet Take 0.5 tablets (10 mg total) by mouth daily. Take for 3 days for swelling. (Patient taking differently: Take 10 mg by mouth daily. Take for 3 days for swelling.) 4 tablet 0  . glucose monitoring kit (FREESTYLE) monitoring kit 1 each by Does not apply route 4 (four) times daily - after meals and at bedtime. 1 month Diabetic Testing Supplies for QAC-QHS accuchecks. 1 each 1  . ibuprofen (ADVIL,MOTRIN) 200 MG tablet Take 800 mg by mouth every 8 (eight) hours as needed for mild pain or moderate pain.    . Marland Kitchennsulin aspart (NOVOLOG FLEXPEN) 100 UNIT/ML FlexPen Inject 2-15 Units into the skin 3 (three) times daily with meals. Correction coverage: Moderate (average weight, post-op)     CBG < 70: implement hypoglycemia protocol    CBG 70 - 120: 0 units    CBG 121 - 150: 2 units    CBG 151 - 200: 3 units    CBG 201 - 250: 5 units    CBG 251 - 300: 8 units    CBG 301 - 350: 11 units    CBG 351 - 400: 15 units    CBG > 400 call MD and obtain STAT lab verification    15 mL 2  . Insulin Detemir (LEVEMIR FLEXPEN) 100 UNIT/ML Pen Inject 35 Units into the skin at bedtime. 15 mL 2  . isosorbide mononitrate (IMDUR) 30 MG 24 hr tablet Take 1 tablet (30 mg total) by mouth daily. 30 tablet 6  . levothyroxine (SYNTHROID, LEVOTHROID) 200 MCG tablet Take 1 tablet (200 mcg  total) by mouth daily before breakfast. 30 tablet 2  . lisinopril (PRINIVIL,ZESTRIL) 10 MG tablet Take 1 tablet (10 mg total) by mouth daily. 30 tablet 2  . meclizine (ANTIVERT) 25 MG tablet Take 1 tablet (25 mg total) by mouth 2 (two) times daily as needed for  dizziness. 60 tablet 3  . metFORMIN (GLUCOPHAGE) 500 MG tablet Take 1 tablet (500 mg total) by mouth 2 (two) times daily with a meal. 60 tablet 2  . NEEDLE, DISP, 30 G (BD DISP NEEDLES) 30G X 1/2" MISC 1 each by Does not apply route 3 (three) times daily before meals. 100 each 3  . nicotine (NICODERM CQ - DOSED IN MG/24 HOURS) 21 mg/24hr patch Place 21 mg onto the skin daily.    . nitroGLYCERIN (NITROSTAT) 0.4 MG SL tablet Place 1 tablet (0.4 mg total) under the tongue every 5 (five) minutes as needed for chest pain. 50 tablet 2  . ticagrelor (BRILINTA) 90 MG TABS tablet Take 1 tablet (90 mg total) by mouth 2 (two) times daily. 32 tablet 0   No current facility-administered medications on file prior to visit.             Review of Systems  General: negative for fever, weight loss, appetite change Eyes: no visual symptoms. ENT: no ear symptoms, no sinus tenderness, no nasal congestion or sore throat. Neck: no pain  Breast: see hpi Respiratory: no wheezing, shortness of breath, cough Cardiovascular: no chest pain, no dyspnea on exertion, no pedal edema, no orthopnea. Gastrointestinal: no abdominal pain, no diarrhea, no constipation Genito-Urinary: no urinary frequency, no dysuria, no polyuria. Hematologic: no bruising Endocrine: no cold or heat intolerance Neurological: no headaches, no seizures, no tremors Musculoskeletal: positive for knee pains Skin: no pruritus, no rash. Psychological: no depression, no anxiety,       Objective: Filed Vitals:   12/26/14 1234 12/26/14 1237  BP:  106/73  Pulse:  87  Temp:  98.3 F (36.8 C)  TempSrc:  Oral  Resp:  18  Height: _0  (1.575 m)   Weight: 195 lb 3.2 oz (88.542 kg)     SpO2:  97%      Physical Exam  Constitutional: normal appearing,  Breast:  Normal appearance, no palpable lumps. Cardiovascular: normal rate and rhythm, normal heart sounds, no murmurs, rub or gallop, no pedal edema Respiratory: reproducible chest pain on deep palpation of rib cage in inframammary region, clear to auscultation bilaterally, no wheezes, no rales, no rhonchi Abdomen: soft, not tender to palpation, normal bowel sounds, no enlarged organs Lymphatics:  No palpable lymphadenopathy. Psychological: normal mood.         Assessment & Plan:  54 year old female with a history of uncontrolled Type 2 DM, HTN, uncontrolled Hypothyroidism Depression and anxiety, severe three vessel CAD s/p stent placement  With breast complaints today.   ?Left breast lump:  I do not feel anything on my exam however due to the fact that she has been non compliant with mammograms I am referring her for diagnostic mammogram.  PCP will need to follow-up on symptoms at her next visit.    Costochondritis:  given the reproducible nature of the  Left chest wall inframammary pain I am suspecting costochondritis as a possible etiology.  This note has been created with Surveyor, quantity. Any transcriptional errors are unintentional.

## 2014-12-27 ENCOUNTER — Ambulatory Visit: Payer: Medicare Other

## 2014-12-28 ENCOUNTER — Encounter: Payer: Self-pay | Admitting: Cardiology

## 2014-12-28 NOTE — Assessment & Plan Note (Signed)
Smoking cessation instruction/counseling given:  commended patient for quitting and reviewed strategies for preventing relapses 

## 2014-12-28 NOTE — Assessment & Plan Note (Signed)
Has been started on insulin - both long-acting and meal coverage. Also on metformin. Currently being followed by the [facility]. Is trying to establish a permanent primary care physician.

## 2014-12-28 NOTE — Assessment & Plan Note (Signed)
Class 3/unstable angina that required a relook cath. She is not had any further symptoms of this nature. It was interesting that he had she had a de novo lesion in the LAD upstream from her previous stents. Will need aggressive risk factor modification with high-dose statin as well as beta blocker and ACE inhibitor. She quit smoking at the time for non-STEMI and is currently trying to get her diabetes under control.

## 2014-12-28 NOTE — Assessment & Plan Note (Signed)
Status post extensive PCI with 3 stents in the LAD and a mini crush technique and stenting in the circumflex-OM1. She needs to be on dual antiplatelet therapy for minimum of a year. Unfortunately think she is having some dyspnea issues with the Brilinta as well as cost issues. If we cannot get the Brilinta cost adjustments with her bare coverage, we can switch her to Plavix.   The plan is for her to complete her current prescription of Brilinta that she has. She will then start the following day with 300 mg Plavix then the next day start with daily Plavix. We can check P2 why inhibitor assay shortly thereafter which would be roughly time for her to get her lipid panel checked. I would continue dual therapy  for least one year and then consider stopping aspirin and continuing Plavix indefinitely.  She is currently now on Lipitor, carvedilol and lisinopril which is beneficial for her diabetes.

## 2014-12-28 NOTE — Assessment & Plan Note (Signed)
Lipids were adequate control in the hospital with elevated triglycerides. She really should be on fenofibrate along with statin.  She is on so many medicines reluctant to start another one, so we will simply recheck lipids in about a month and a half to 2 months to reassess the triglyceride levels. For now continue on high-dose statin given recent tandem acute coronary syndrome episodes.

## 2014-12-28 NOTE — Assessment & Plan Note (Addendum)
No active anginal or heart failure symptoms. Preserved EF on echo with no regional wall motion modalities.  She had some mild diastolic heart failure symptoms. Is on low-dose Lasix for her mild edema. We'll probably end up using it when necessary only.

## 2014-12-28 NOTE — Assessment & Plan Note (Signed)
This doesn't sound very hypotensive related as far as symptoms go. Sounds more consistent with positional vertigo or vestibular basal issues. While this is being evaluated by her primary care provider, I will prescribe meclizine.

## 2014-12-28 NOTE — Assessment & Plan Note (Signed)
Excellent control on current medications. No need to titrate up for now. We'll reassess at follow-up and consider titrating the beta blocker up more.

## 2014-12-29 ENCOUNTER — Other Ambulatory Visit: Payer: Self-pay | Admitting: Family Medicine

## 2014-12-29 ENCOUNTER — Ambulatory Visit
Admission: RE | Admit: 2014-12-29 | Discharge: 2014-12-29 | Disposition: A | Discharge status: Home / Self Care | Payer: Medicare Other | Source: Ambulatory Visit | Attending: Family Medicine | Admitting: Family Medicine

## 2014-12-29 DIAGNOSIS — N63 Unspecified lump in unspecified breast: Secondary | ICD-10-CM

## 2014-12-29 DIAGNOSIS — R928 Other abnormal and inconclusive findings on diagnostic imaging of breast: Secondary | ICD-10-CM | POA: Diagnosis not present

## 2015-01-03 ENCOUNTER — Telehealth: Payer: Self-pay | Admitting: *Deleted

## 2015-01-03 NOTE — Telephone Encounter (Signed)
-----   Message from Arnoldo Morale, MD sent at 12/30/2014  8:26 AM EDT ----- Please inform the patient that her mammogram report is normal. Thank you.

## 2015-01-05 ENCOUNTER — Ambulatory Visit: Payer: Medicare Other | Attending: Family Medicine | Admitting: Family Medicine

## 2015-01-05 ENCOUNTER — Encounter: Payer: Self-pay | Admitting: Family Medicine

## 2015-01-05 ENCOUNTER — Telehealth (HOSPITAL_COMMUNITY): Payer: Self-pay | Admitting: *Deleted

## 2015-01-05 VITALS — BP 104/70 | HR 99 | Temp 99.1°F | Resp 16 | Ht 62.0 in | Wt 198.0 lb

## 2015-01-05 DIAGNOSIS — I10 Essential (primary) hypertension: Secondary | ICD-10-CM

## 2015-01-05 DIAGNOSIS — E039 Hypothyroidism, unspecified: Secondary | ICD-10-CM | POA: Diagnosis not present

## 2015-01-05 DIAGNOSIS — E1169 Type 2 diabetes mellitus with other specified complication: Secondary | ICD-10-CM

## 2015-01-05 DIAGNOSIS — E559 Vitamin D deficiency, unspecified: Secondary | ICD-10-CM | POA: Diagnosis not present

## 2015-01-05 DIAGNOSIS — M25561 Pain in right knee: Secondary | ICD-10-CM | POA: Diagnosis not present

## 2015-01-05 DIAGNOSIS — E119 Type 2 diabetes mellitus without complications: Secondary | ICD-10-CM | POA: Diagnosis not present

## 2015-01-05 DIAGNOSIS — N644 Mastodynia: Secondary | ICD-10-CM | POA: Insufficient documentation

## 2015-01-05 DIAGNOSIS — Z9861 Coronary angioplasty status: Secondary | ICD-10-CM

## 2015-01-05 DIAGNOSIS — E785 Hyperlipidemia, unspecified: Secondary | ICD-10-CM

## 2015-01-05 DIAGNOSIS — R42 Dizziness and giddiness: Secondary | ICD-10-CM | POA: Diagnosis not present

## 2015-01-05 DIAGNOSIS — Z87891 Personal history of nicotine dependence: Secondary | ICD-10-CM | POA: Insufficient documentation

## 2015-01-05 DIAGNOSIS — E876 Hypokalemia: Secondary | ICD-10-CM | POA: Diagnosis not present

## 2015-01-05 DIAGNOSIS — M899 Disorder of bone, unspecified: Secondary | ICD-10-CM | POA: Diagnosis not present

## 2015-01-05 DIAGNOSIS — I208 Other forms of angina pectoris: Secondary | ICD-10-CM | POA: Diagnosis not present

## 2015-01-05 DIAGNOSIS — G894 Chronic pain syndrome: Secondary | ICD-10-CM

## 2015-01-05 DIAGNOSIS — I2510 Atherosclerotic heart disease of native coronary artery without angina pectoris: Secondary | ICD-10-CM | POA: Diagnosis not present

## 2015-01-05 DIAGNOSIS — M949 Disorder of cartilage, unspecified: Secondary | ICD-10-CM | POA: Diagnosis not present

## 2015-01-05 DIAGNOSIS — R0782 Intercostal pain: Secondary | ICD-10-CM

## 2015-01-05 DIAGNOSIS — M7918 Myalgia, other site: Secondary | ICD-10-CM

## 2015-01-05 LAB — BASIC METABOLIC PANEL
BUN: 7 mg/dL (ref 6–23)
CO2: 26 mEq/L (ref 19–32)
Calcium: 9.4 mg/dL (ref 8.4–10.5)
Chloride: 102 mEq/L (ref 96–112)
Creat: 0.47 mg/dL — ABNORMAL LOW (ref 0.50–1.10)
Glucose, Bld: 126 mg/dL — ABNORMAL HIGH (ref 70–99)
Potassium: 4.4 mEq/L (ref 3.5–5.3)
SODIUM: 138 meq/L (ref 135–145)

## 2015-01-05 LAB — GLUCOSE, POCT (MANUAL RESULT ENTRY): POC Glucose: 149 mg/dl — AB (ref 70–99)

## 2015-01-05 MED ORDER — ACETAMINOPHEN-CODEINE #3 300-30 MG PO TABS: 1.0000 | ORAL_TABLET | Freq: Three times a day (TID) | ORAL | Status: DC | PRN

## 2015-01-05 MED ORDER — CLOPIDOGREL BISULFATE 75 MG PO TABS: 75.0000 mg | ORAL_TABLET | Freq: Every day | ORAL | Status: DC

## 2015-01-05 NOTE — Telephone Encounter (Signed)
Contacted pt via cell phone.  Pt is in the area today for follow up appt and she is also apartment shopping.  Pt requested call back mid august. Pt plans to transition to the [place] area the first of august. Will call back mid August.  Maurice Small RN, BSN

## 2015-01-05 NOTE — Progress Notes (Signed)
Establish care with PCP Complaining of pain on lt breast Stated has a knot under Lt breast with pain Mammogram done last week

## 2015-01-05 NOTE — Assessment & Plan Note (Signed)
R knee pain: X 2 months following fall MRI is a good idea especially since you cannot get a steroid injection and x-ray was unrevealing. MRI is usually done in preparation for surgery. Ortho referral placed

## 2015-01-05 NOTE — Assessment & Plan Note (Signed)
L chest pain: I suspect intercostal (between ribs) muscle pain Injection of lidocaine into 4 cm area of pain done today  Tylenol #3 refilled

## 2015-01-05 NOTE — Progress Notes (Signed)
   Subjective:    Patient ID: Jacqueline Munoz, female    DOB: Nov 01, 1960, 54 y.o.   MRN: [id number] CC: meet PCP, f/u R knee pain, L breast pain  HPI 54 yo F od CAD and diabetes presents to discuss the following:  1. L breast pain: 2 years of pain at inferior pole of L breast.  Pain radiates to R upper abdomen. Pain in described as a vice-like tightness and ache. Pain is not relieved with muscle relaxer. Pain is not associated with GI upset, diaphoresis or SOB. Has a normal [place] of mammogram of breast on 12/29/2014.   2. R knee pain: slip and fall onto R knee 2 month ago. Has stiffness in knee with pain that radiates down to the shin. Had x-rays that were non-revealing. Is allergic to corticosteroid so cannot get steroid injection.    Soc Hx: former smoker, quit  Review of Systems  Constitutional: Negative for fever and chills.  Musculoskeletal: Positive for arthralgias.  L chest wall pain      Objective:   Physical Exam BP 104/70 mmHg  Pulse 99  Temp(Src) 99.1 F (37.3 C) (Oral)  Resp 16  Ht 5\' 2"  (1.575 m)  Wt 198 lb (89.812 kg)  BMI 36.21 kg/m2  SpO2 97% General appearance: alert, cooperative and no distress Breasts: normal appearance, no masses or tenderness, Inspection negative, No nipple retraction or dimpling, No axillary or supraclavicular adenopathy, positive findings: there is a 4 cm wide by 1 cm long band of tenderness along L inferior breast ridge at 5th intercostal space  Extremities: no edema, slight decreased flexion in R knee, no redness or swelling   After obtaining verbal consent and cleaninng the skin using iodine and alcohol a trigger point injection was performed at 5th intercostal space using 1% plain Lidocaine, 2 ml was injected.   Lab Results  Component Value Date   HGBA1C 12.5* 11/15/2014   CBG 149     Assessment & Plan:

## 2015-01-05 NOTE — Patient Instructions (Addendum)
Ms. Engebretsen,  Thank you for coming in today. It was a pleasure meeting you. I look forward to being your primary doctor.  1. L chest pain: I suspect intercostal (between ribs) muscle pain Injection of lidocaine into 4 cm area of pain done today  Tylenol #3 refilled  2. Heart disease: Plavix refilled   3. R knee pain: X 2 months following fall MRI is a good idea especially since you cannot get a steroid injection and x-ray was unrevealing. MRI is usually done in preparation for surgery. Ortho referral placed  F/u in 1 month for repeat A1c for diabetes   Dr. Adrian Blackwater

## 2015-01-06 DIAGNOSIS — E559 Vitamin D deficiency, unspecified: Secondary | ICD-10-CM | POA: Insufficient documentation

## 2015-01-06 LAB — TSH: TSH: 0.246 u[IU]/mL — ABNORMAL LOW (ref 0.350–4.500)

## 2015-01-06 LAB — VITAMIN D 25 HYDROXY (VIT D DEFICIENCY, FRACTURES): Vit D, 25-Hydroxy: 12 ng/mL — ABNORMAL LOW (ref 30–100)

## 2015-01-06 MED ORDER — VITAMIN D (ERGOCALCIFEROL) 1.25 MG (50000 UNIT) PO CAPS: 50000.0000 [IU] | ORAL_CAPSULE | ORAL | Status: DC

## 2015-01-06 MED ORDER — LEVOTHYROXINE SODIUM 175 MCG PO TABS: 175.0000 ug | ORAL_TABLET | Freq: Every day | ORAL | Status: DC

## 2015-01-06 NOTE — Assessment & Plan Note (Signed)
  TSH is slight low now, decrease synthroid dose from 200 mcg to 175 mcg daily

## 2015-01-06 NOTE — Addendum Note (Signed)
Addended by: Boykin Nearing on: 01/06/2015 04:59 PM   Modules accepted: Orders

## 2015-01-06 NOTE — Assessment & Plan Note (Signed)
A: vit D deficiency P: replaced vit D orally per orders 50 K U weekly x 8 weeks

## 2015-01-09 ENCOUNTER — Telehealth: Payer: Self-pay | Admitting: *Deleted

## 2015-01-09 NOTE — Telephone Encounter (Signed)
Verified name and date of birth.  Gave results of mammogram and ultrasound.  Patient verbalized understanding and had no questions.

## 2015-01-09 NOTE — Telephone Encounter (Signed)
-----   Message from Arnoldo Morale, MD sent at 01/09/2015  8:33 AM EDT ----- Please inform her that her mammogram and ultrasound reports show no evidence of malignancy. Thanks

## 2015-01-11 ENCOUNTER — Telehealth: Payer: Self-pay | Admitting: *Deleted

## 2015-01-11 NOTE — Telephone Encounter (Signed)
-----   Message from Boykin Nearing, MD sent at 01/06/2015  4:58 PM EDT ----- Vit D def will treat TSH is slight low now, decrease synthroid dose from 200 mcg to 175 mcg daily

## 2015-01-11 NOTE — Telephone Encounter (Signed)
Unable to contact Pt, phone disconnected

## 2015-01-18 NOTE — Telephone Encounter (Signed)
communication letter mail to pt

## 2015-01-20 ENCOUNTER — Encounter: Payer: Self-pay | Admitting: Family Medicine

## 2015-01-20 ENCOUNTER — Ambulatory Visit (INDEPENDENT_AMBULATORY_CARE_PROVIDER_SITE_OTHER): Payer: Medicare Other | Admitting: Family Medicine

## 2015-01-20 VITALS — BP 143/71 | Ht 62.0 in | Wt 196.0 lb

## 2015-01-20 DIAGNOSIS — M25561 Pain in right knee: Secondary | ICD-10-CM | POA: Diagnosis not present

## 2015-01-20 DIAGNOSIS — M545 Low back pain, unspecified: Secondary | ICD-10-CM | POA: Insufficient documentation

## 2015-01-20 DIAGNOSIS — I208 Other forms of angina pectoris: Secondary | ICD-10-CM

## 2015-01-20 NOTE — Assessment & Plan Note (Signed)
Patient with previous x-rays of the right knee however these were not standing. We will obtain new x-rays including AP standing to evaluate for joint space disease. -Concern for underlying meniscal pathology with positive physical exams and history. -She will follow-up in 1 week to go over her x-rays and we will get an MRI at that time if she does not have evidence of joint disease. -She will continue with ibuprofen when necessary. Offered joint injection today but she states she is allergic to steroids we will not proceed with this. -She was given a J and J brace for her right knee to help with the feeling of giving way.

## 2015-01-20 NOTE — Assessment & Plan Note (Signed)
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 She has no red flags on exam or history. -We will obtain two-view of the lumbar spine to evaluate for this. -Consider addition of Neurontin/Cymbalta/TCA to help with some of the lumbar pain. - Consider also physical therapy as well as epidural spinal injections pending her x-rays. If continues for the next several visits, consider MRI of the spine.

## 2015-01-20 NOTE — Progress Notes (Signed)
Patient ID: Jacqueline Munoz, female   DOB: Jun 19, 1961, 54 y.o.   MRN: [id number] [facility]: Attending Note: I have reviewed the chart, discussed wit the Sports Medicine Fellow. I agree with assessment and treatment plan as detailed in the [place] note.

## 2015-01-20 NOTE — Progress Notes (Signed)
  Jacqueline Munoz - 54 y.o. female MRN [id number]  Date of birth: [birth year]  SUBJECTIVE:  Including CC & ROS.  Jacqueline Munoz is a 54 y.o. female who presents today for right knee pain and low back pain, initial visit.    Knee Pain right initial visit - patient's been having ongoing right knee pain for about 2 months since she slipped on her deck when it was raining and fell onto the anterior aspect of her knee. She denies any twisting, acute pop, or effusion at that time. However she has continued to have medial joint line pain since the injury. She has tried Tylenol 3 along with over-the-counter anti-inflammatories which have not helped too much. She has not tried physical therapy, bracing, or injection into the knee. She denies previous injury to the area and she is right sided dominant. Patient does describe occasional effusion after staying on her feet throughout the day but denies any paresthesias going into the right lower extremity.  Low back pain initial visit-her back pain has been ongoing over a year now. She denies any type of injury or acute traumatic event at that time. Pain described as achy in the lumbar region with occasional shooting pain into the left posterior leg. Pain is worse with bending and Pt denies any current bowel/bladder problems, fever, chills, unintentional weight loss, night time awakenings secondary to pain, weakness in one or both legs she has not tried physical therapy for this in the past, and has not had any imaging to work this up previously. He does take Tylenol No. 3 prescribed by her PCP which helps occasionally with some of the pain. The back pain does not awaken her at night.      PMHx - Updated and reviewed.  Contributory factors include: Fibromyalgia. Anxiety and depression.  Cigarette smoker SHx - Updated and reviewed.  Contributory factors include:  CABG   FHx - Updated and reviewed.  Contributory factors include:  Non contributory  Medications - Ibuprofen,  Percocet, Tylenol #3,    Exam:  Filed Vitals:   01/20/15 1129  BP: 143/71    Gen: NAD Cardiorespiratory - Normal respiratory effort/rate.  RRR Knee:  Normal to inspection with no erythema or effusion or obvious bony abnormalities.  No obvious Baker's cysts TTP along medial joint line  ROM normal in flexion (135 degrees) and extension (0 degrees) and lower leg rotation. Ligaments with solid consistent endpoints including ACL, PCL, LCL, MCL.  Negative Anterior Drawer/Lachman/Pivot Shift Positive Mcmurray's and provocative meniscal tests including Thessaly and Apley compression testing  Non painful patellar compression.  Normal Patellar glide.  No apprehension  Patellar and quadriceps tendons unremarkable. Hamstring and quadriceps strength is normal.  Back exam: No obvious scoliosis or abnormalities noted. She has no tenderness to palpation along the spinous processes or paraspinal muscles. There is no step-off deformity. Range of motion is normal in flexion and extension sidebending and rotation. Muscle strength of the lower extremity is 5 out of 5 and DTRs are 2 out of 4 positive she does have a positive straight leg raise on the left with positive slump test.  Neurovascularly intact B/L LE

## 2015-01-25 ENCOUNTER — Other Ambulatory Visit: Payer: Self-pay | Admitting: Internal Medicine

## 2015-01-25 DIAGNOSIS — E1165 Type 2 diabetes mellitus with hyperglycemia: Secondary | ICD-10-CM

## 2015-01-25 DIAGNOSIS — E039 Hypothyroidism, unspecified: Secondary | ICD-10-CM

## 2015-01-25 MED ORDER — TICAGRELOR 90 MG PO TABS: 90.0000 mg | ORAL_TABLET | Freq: Two times a day (BID) | ORAL | Status: DC

## 2015-01-25 MED ORDER — LEVOTHYROXINE SODIUM 175 MCG PO TABS: 175.0000 ug | ORAL_TABLET | Freq: Every day | ORAL | Status: DC

## 2015-01-25 MED ORDER — INSULIN ASPART 100 UNIT/ML FLEXPEN: 2.0000 [IU] | PEN_INJECTOR | Freq: Three times a day (TID) | SUBCUTANEOUS | Status: DC

## 2015-01-25 MED ORDER — INSULIN DETEMIR 100 UNIT/ML FLEXPEN: 35.0000 [IU] | PEN_INJECTOR | Freq: Every day | SUBCUTANEOUS | Status: DC

## 2015-01-26 ENCOUNTER — Telehealth: Payer: Self-pay | Admitting: Family Medicine

## 2015-01-26 NOTE — Telephone Encounter (Signed)
Patient returned phone call, she received letter and would like to speak to RMA. Please f/u with pt.

## 2015-01-27 ENCOUNTER — Ambulatory Visit: Payer: Medicare Other | Admitting: Family Medicine

## 2015-01-31 NOTE — Telephone Encounter (Signed)
Pt aware of results     Vit D def will treat TSH is slight low now, decrease synthroid dose from 200 mcg to 175 mcg daily

## 2015-02-02 ENCOUNTER — Encounter: Payer: Self-pay | Admitting: Family Medicine

## 2015-02-02 ENCOUNTER — Ambulatory Visit: Payer: Medicare Other | Attending: Family Medicine | Admitting: Family Medicine

## 2015-02-02 VITALS — BP 115/77 | HR 78 | Temp 98.9°F | Resp 16 | Ht 62.0 in | Wt 189.0 lb

## 2015-02-02 DIAGNOSIS — R5382 Chronic fatigue, unspecified: Secondary | ICD-10-CM

## 2015-02-02 DIAGNOSIS — I208 Other forms of angina pectoris: Secondary | ICD-10-CM | POA: Diagnosis not present

## 2015-02-02 DIAGNOSIS — R5383 Other fatigue: Secondary | ICD-10-CM | POA: Insufficient documentation

## 2015-02-02 DIAGNOSIS — E1151 Type 2 diabetes mellitus with diabetic peripheral angiopathy without gangrene: Secondary | ICD-10-CM

## 2015-02-02 DIAGNOSIS — R739 Hyperglycemia, unspecified: Secondary | ICD-10-CM

## 2015-02-02 DIAGNOSIS — R0782 Intercostal pain: Secondary | ICD-10-CM

## 2015-02-02 DIAGNOSIS — E1159 Type 2 diabetes mellitus with other circulatory complications: Secondary | ICD-10-CM | POA: Diagnosis not present

## 2015-02-02 DIAGNOSIS — M7918 Myalgia, other site: Secondary | ICD-10-CM

## 2015-02-02 LAB — GLUCOSE, POCT (MANUAL RESULT ENTRY): POC GLUCOSE: 173 mg/dL — AB (ref 70–99)

## 2015-02-02 LAB — POCT GLYCOSYLATED HEMOGLOBIN (HGB A1C): Hemoglobin A1C: 6.6

## 2015-02-02 LAB — VITAMIN B12: Vitamin B-12: 353 pg/mL (ref 211–911)

## 2015-02-02 MED ORDER — ACETAMINOPHEN-CODEINE #3 300-30 MG PO TABS: 1.0000 | ORAL_TABLET | Freq: Three times a day (TID) | ORAL | Status: DC | PRN

## 2015-02-02 NOTE — Addendum Note (Signed)
Addended by: Boykin Nearing on: 02/02/2015 04:29 PM   Modules accepted: Orders

## 2015-02-02 NOTE — Assessment & Plan Note (Signed)
A: L chest wall pain: persistent with tenderness. Tylenol #3 is not helping much. Patient not advised to take NSAIDs due to being on plavix and brilinta. Patient is distressed by this chronic pain.  P: Muscle pain  Tylenol #3 for pain  CT chest ordered Pain management referral

## 2015-02-02 NOTE — Progress Notes (Signed)
F/U DM Complaining of mass under lt breast with pain  Pain running under lt and rt arm

## 2015-02-02 NOTE — Assessment & Plan Note (Signed)
Diabetes: A1c is at goal [place] job! Continue levemir

## 2015-02-02 NOTE — Progress Notes (Signed)
   Subjective:    Patient ID: Jacqueline Munoz, female    DOB: 1960/10/22, 54 y.o.   MRN: [id number] CC: f/u diabetes and chronic L breast pain  HPI  1. CHRONIC DIABETES  Disease Monitoring  Blood Sugar Ranges: 100s  Polyuria: no   Visual problems: no   Medication Compliance: yes, not requiring prn novolog  Medication Side Effects  Hypoglycemia: no   2. L breast pain: chronic pain x 2 years. Normal b/l mammogram and L breast US in 12/28/2104. Has intercostal lidocaine injection at last OV. Since last OV reports pain is unchanged. Lidocaine injection did not help much. tylenol #3 is not helping much. No chest pressure or SOB.   History  Substance Use Topics  . Smoking status: Former Smoker -- 0.00 packs/day for 36 years    Types: Cigarettes    Quit date: 11/15/2014  . Smokeless tobacco: Never Used  . Alcohol Use: No     Comment: 11/15/2014 "might have a few drinks/yr"   Review of Systems  Constitutional: Negative for fever and chills.  Eyes: Negative for visual disturbance.  Respiratory: Negative for shortness of breath.   Cardiovascular: Positive for chest pain.       Chronic chest wall pain originating in L, lower, lateral intercostal space   Gastrointestinal: Negative for abdominal pain and blood in stool.  Musculoskeletal: Negative for back pain and arthralgias.  Skin: Negative for rash.  Allergic/Immunologic: Negative for immunocompromised state.  Neurological: Positive for dizziness (has known vit D deficiency and will start supplement ).  Hematological: Negative for adenopathy. Does not bruise/bleed easily.  Psychiatric/Behavioral: Negative for suicidal ideas and dysphoric mood.      Objective:   Physical Exam  Constitutional: She is oriented to person, place, and time. She appears well-developed and well-nourished. No distress.  HENT:  Head: Normocephalic and atraumatic.  Cardiovascular: Normal rate, regular rhythm and normal heart sounds.   Pulmonary/Chest: Effort  normal and breath sounds normal. She exhibits tenderness (tenderness at L lower lateral intecostal space with muscle fullness ).  Musculoskeletal: She exhibits no edema.  Neurological: She is alert and oriented to person, place, and time.  Skin: Skin is warm and dry. No rash noted.  Psychiatric: She has a normal mood and affect.  BP 115/77 mmHg  Pulse 78  Temp(Src) 98.9 F (37.2 C) (Oral)  Resp 16  Ht 5\' 2"  (1.575 m)  Wt 189 lb (85.73 kg)  BMI 34.56 kg/m2  SpO2 98% Lab Results  Component Value Date   HGBA1C 6.60 02/02/2015   CBG 173     Assessment & Plan:

## 2015-02-02 NOTE — Patient Instructions (Addendum)
Jacqueline Munoz,  Thank you for coming in today  1. Diabetes: A1c is at goal [place] [employer]! Continue levemir  2. L chest wall pain: Muscle pain  Tylenol #3 for pain  CT chest ordered Pain management referral   F/u in 3 months for diabetes    Dr. Adrian Blackwater

## 2015-02-03 LAB — MICROALBUMIN / CREATININE URINE RATIO
CREATININE, URINE: 258.3 mg/dL
MICROALB/CREAT RATIO: 12.8 mg/g (ref 0.0–30.0)
Microalb, Ur: 3.3 mg/dL — ABNORMAL HIGH (ref <2.0)

## 2015-02-10 ENCOUNTER — Ambulatory Visit (HOSPITAL_COMMUNITY)
Admission: RE | Admit: 2015-02-10 | Discharge: 2015-02-10 | Disposition: A | Discharge status: Home / Self Care | Payer: Medicare Other | Source: Ambulatory Visit | Attending: Family Medicine | Admitting: Family Medicine

## 2015-02-10 DIAGNOSIS — R0782 Intercostal pain: Secondary | ICD-10-CM | POA: Insufficient documentation

## 2015-02-10 DIAGNOSIS — I2510 Atherosclerotic heart disease of native coronary artery without angina pectoris: Secondary | ICD-10-CM | POA: Diagnosis not present

## 2015-02-10 DIAGNOSIS — R918 Other nonspecific abnormal finding of lung field: Secondary | ICD-10-CM | POA: Insufficient documentation

## 2015-02-10 DIAGNOSIS — M7918 Myalgia, other site: Secondary | ICD-10-CM

## 2015-02-22 ENCOUNTER — Telehealth: Payer: Self-pay

## 2015-02-22 DIAGNOSIS — E1165 Type 2 diabetes mellitus with hyperglycemia: Secondary | ICD-10-CM

## 2015-02-22 NOTE — Telephone Encounter (Signed)
Patient called wanting to obtain her results for her CT scan. Please follow up with pt. Thank you.

## 2015-02-22 NOTE — Telephone Encounter (Signed)
Accidentally routed to [facility]. Re-routing to [facility] as this MRI was ordered by Dr. Jarold Song.

## 2015-02-24 NOTE — Telephone Encounter (Signed)
Pt. Came in wanting to know her results....pt. Would also like a refill on her tylenol #3...Marland Kitchenplease follow up with patient

## 2015-02-27 NOTE — Telephone Encounter (Signed)
Patient came into clinic to get CT results. Patient verified date of birth. Patient aware of CT results showing normal results except small nodules in right lung and recommendations to have follow up CT scan in 6 months to look for changes.  Patient aware of no finding in area of pain in left chest. Patient reports having knot in left chest area that can be felt and is currently in pain, at level 9, described as aching and it goes through to her back.  Patient reports Tylenol 3 does not help a lot but will take the edge off so patient can get comfortable.  Patient needs refill on syringes.  Patient needs prescription for syringes and Tylenol 3.  Tylenol 3 was filled for 60 tablets on 02/02/15 and then on 02/13/15.  Patient aware of provider may not refill Tylenol 3 this soon. Please send prescription for syringes to [facility].  Patient is making appointment to discuss CT findings further. Patient can not be seen until 03/20/15.  Can you call patient to discuss findings further?

## 2015-02-27 NOTE — Telephone Encounter (Signed)
Pt. Is calling back again regarding results and medication refill.Jacqueline KitchenMarland KitchenMarland KitchenMarland Kitchenplease follow up with patient

## 2015-02-28 MED ORDER — "NEEDLE (DISP) 30G X 1/2"" MISC": 1.0000 | Freq: Three times a day (TID) | Status: DC

## 2015-02-28 NOTE — Telephone Encounter (Signed)
Patient has flexpen, why does she need syringes? please clarify with patient.  I have refilled needles.  Tylenol #3 cannot be filled this soon

## 2015-03-02 ENCOUNTER — Other Ambulatory Visit: Payer: Self-pay | Admitting: Family Medicine

## 2015-03-02 DIAGNOSIS — E1151 Type 2 diabetes mellitus with diabetic peripheral angiopathy without gangrene: Secondary | ICD-10-CM

## 2015-03-02 MED ORDER — "INSULIN SYRINGE-NEEDLE U-100 30G X 5/16"" 0.5 ML MISC": 1.0000 | Freq: Four times a day (QID) | Status: DC

## 2015-03-20 ENCOUNTER — Encounter: Payer: Self-pay | Admitting: Family Medicine

## 2015-03-20 ENCOUNTER — Ambulatory Visit: Payer: Medicare Other | Attending: Family Medicine | Admitting: Family Medicine

## 2015-03-20 VITALS — BP 123/75 | HR 72 | Temp 99.3°F | Resp 16 | Ht 62.0 in | Wt 201.0 lb

## 2015-03-20 DIAGNOSIS — R911 Solitary pulmonary nodule: Secondary | ICD-10-CM | POA: Insufficient documentation

## 2015-03-20 DIAGNOSIS — M545 Low back pain: Secondary | ICD-10-CM

## 2015-03-20 DIAGNOSIS — G894 Chronic pain syndrome: Secondary | ICD-10-CM | POA: Diagnosis not present

## 2015-03-20 DIAGNOSIS — Z0000 Encounter for general adult medical examination without abnormal findings: Secondary | ICD-10-CM

## 2015-03-20 DIAGNOSIS — I208 Other forms of angina pectoris: Secondary | ICD-10-CM

## 2015-03-20 DIAGNOSIS — M7989 Other specified soft tissue disorders: Secondary | ICD-10-CM

## 2015-03-20 DIAGNOSIS — Z23 Encounter for immunization: Secondary | ICD-10-CM

## 2015-03-20 DIAGNOSIS — E1159 Type 2 diabetes mellitus with other circulatory complications: Secondary | ICD-10-CM | POA: Diagnosis not present

## 2015-03-20 DIAGNOSIS — Z87891 Personal history of nicotine dependence: Secondary | ICD-10-CM

## 2015-03-20 DIAGNOSIS — R490 Dysphonia: Secondary | ICD-10-CM

## 2015-03-20 DIAGNOSIS — I10 Essential (primary) hypertension: Secondary | ICD-10-CM

## 2015-03-20 DIAGNOSIS — E1151 Type 2 diabetes mellitus with diabetic peripheral angiopathy without gangrene: Secondary | ICD-10-CM

## 2015-03-20 MED ORDER — LISINOPRIL 10 MG PO TABS: 10.0000 mg | ORAL_TABLET | Freq: Every day | ORAL | Status: DC

## 2015-03-20 MED ORDER — CARVEDILOL 3.125 MG PO TABS: 3.1250 mg | ORAL_TABLET | Freq: Two times a day (BID) | ORAL | Status: DC

## 2015-03-20 MED ORDER — MEDICAL COMPRESSION STOCKINGS MISC: Status: DC

## 2015-03-20 MED ORDER — METFORMIN HCL 500 MG PO TABS: 500.0000 mg | ORAL_TABLET | Freq: Two times a day (BID) | ORAL | Status: DC

## 2015-03-20 NOTE — Progress Notes (Addendum)
Subjective:    Patient ID: Jacqueline Munoz, female    DOB: [birth year], 54 y.o.   MRN: [id number] CC: f/u L chest pain, CT scan results   HPI  1.  L breast pain: chronic pain x 2 years. Normal b/l mammogram and L breast US in 12/28/2104. CT of chest done on 02/10/2015 and did not reveal source of pain. The CT scan did reveal multiple lung nodules, the largest measuring 6 mm.   2. Incidental lung nodules: noted on CT scan as mentioned above. F/u CT in 6-12 months recommended. patient is a former heavy smoker.    Social History  Substance Use Topics  . Smoking status: Former Smoker -- 1.50 packs/day for 36 years    Types: Cigarettes    Quit date: 11/15/2014  . Smokeless tobacco: Never Used  . Alcohol Use: No     Comment: 11/15/2014 "might have a few drinks/yr"   Review of Systems  Constitutional: Negative for fever and chills.  HENT: Voice change: hoarseness for 2 years    Eyes: Negative for visual disturbance.  Respiratory: Positive for shortness of breath (sob in AM w/o cough ).   Cardiovascular: Positive for chest pain.       Chronic chest wall pain originating in L, lower, lateral intercostal space   Gastrointestinal: Negative for abdominal pain and blood in stool.  Musculoskeletal: Negative for back pain and arthralgias.  Skin: Negative for rash.  Allergic/Immunologic: Negative for immunocompromised state.  Neurological: Positive for dizziness (has known vit D deficiency and will start supplement ).  Hematological: Negative for adenopathy. Does not bruise/bleed easily.  Psychiatric/Behavioral: Negative for suicidal ideas and dysphoric mood.  gad 7: 16. 2-6,7. 3-1-4.     Objective:   Physical Exam  Constitutional: She is oriented to person, place, and time. She appears well-developed and well-nourished. No distress.  HENT:  Head: Normocephalic and atraumatic.  Cardiovascular: Normal rate, regular rhythm and normal heart sounds.   Pulmonary/Chest: Effort normal and breath sounds  normal. She exhibits tenderness (tenderness at L lower lateral intecostal space with muscle fullness ).  Musculoskeletal: She exhibits edema.  Neurological: She is alert and oriented to person, place, and time.  Skin: Skin is warm and dry. No rash noted.  Psychiatric: She has a normal mood and affect.  BP 123/75 mmHg  Pulse 72  Temp(Src) 99.3 F (37.4 C) (Oral)  Resp 16  Ht 5\' 2"  (1.575 m)  Wt 201 lb (91.173 kg)  BMI 36.75 kg/m2  SpO2 97% Lab Results  Component Value Date   HGBA1C 6.60 02/02/2015   CBG 173 Lab Results  Component Value Date   HGBA1C 6.60 02/02/2015   Lab Results  Component Value Date   TSH 0.246* 01/05/2015       Assessment & Plan:  Kynadee was seen today for results.  Diagnoses and all orders for this visit:  Former very heavy cigarette smoker (more than 40 per day) -     Ambulatory referral to Pulmonology  Chronic pain syndrome -     Ambulatory referral to Pain Clinic  Essential hypertension -     carvedilol (COREG) 3.125 MG tablet; Take 1 tablet (3.125 mg total) by mouth 2 (two) times daily with a meal. -     lisinopril (PRINIVIL,ZESTRIL) 10 MG tablet; Take 1 tablet (10 mg total) by mouth daily.  Type 2 diabetes, controlled, with peripheral circulatory disorder -     metFORMIN (GLUCOPHAGE) 500 MG tablet; Take 1 tablet (500 mg total) by  mouth 2 (two) times daily with a meal.  Voice hoarseness -     Ambulatory referral to ENT  Healthcare maintenance -     Ambulatory referral to Gastroenterology  Low back pain without sciatica, unspecified back pain laterality -     Ambulatory referral to Pain Clinic  Incidental lung nodule, > 50mm and < 70mm  Leg swelling -     Discontinue: Elastic Bandages & Supports (MEDICAL COMPRESSION STOCKINGS) MISC; 30 mm Hg -     Elastic Bandages & Supports (MEDICAL COMPRESSION STOCKINGS) MISC; Wear daily 30 mm Hg pressure  Other orders -     Flu Vaccine QUAD 36+ mos IM

## 2015-03-20 NOTE — Progress Notes (Signed)
F/U CT results  C/C dizziness, SHOB when getting up. Pain scale 8#  Torso worst on Lt side  Stated hand getting stiff

## 2015-03-20 NOTE — Patient Instructions (Addendum)
Jacqueline Munoz,  Thank you for coming in today.  I have refilled your medications and placed the referrals we discussed Repeat CT scan in Feb, 2017. F/u in  05/2015 for diabetes   Dr. Melanee Left was seen today for results.  Diagnoses and all orders for this visit:  Former very heavy cigarette smoker (more than 40 per day) -     Ambulatory referral to Pulmonology  Chronic pain syndrome -     Ambulatory referral to Pain Clinic  Essential hypertension -     carvedilol (COREG) 3.125 MG tablet; Take 1 tablet (3.125 mg total) by mouth 2 (two) times daily with a meal. -     lisinopril (PRINIVIL,ZESTRIL) 10 MG tablet; Take 1 tablet (10 mg total) by mouth daily.  Type 2 diabetes, controlled, with peripheral circulatory disorder -     metFORMIN (GLUCOPHAGE) 500 MG tablet; Take 1 tablet (500 mg total) by mouth 2 (two) times daily with a meal.  Voice hoarseness -     Ambulatory referral to ENT  Healthcare maintenance -     Ambulatory referral to Gastroenterology  Low back pain without sciatica, unspecified back pain laterality -     Ambulatory referral to Pain Clinic

## 2015-03-23 ENCOUNTER — Ambulatory Visit (INDEPENDENT_AMBULATORY_CARE_PROVIDER_SITE_OTHER): Payer: Medicare Other | Admitting: Cardiology

## 2015-03-23 ENCOUNTER — Encounter: Payer: Self-pay | Admitting: Cardiology

## 2015-03-23 VITALS — BP 152/82 | HR 68 | Ht 62.0 in | Wt 202.0 lb

## 2015-03-23 DIAGNOSIS — E1169 Type 2 diabetes mellitus with other specified complication: Secondary | ICD-10-CM

## 2015-03-23 DIAGNOSIS — Z87891 Personal history of nicotine dependence: Secondary | ICD-10-CM

## 2015-03-23 DIAGNOSIS — I2510 Atherosclerotic heart disease of native coronary artery without angina pectoris: Secondary | ICD-10-CM

## 2015-03-23 DIAGNOSIS — I208 Other forms of angina pectoris: Secondary | ICD-10-CM

## 2015-03-23 DIAGNOSIS — I10 Essential (primary) hypertension: Secondary | ICD-10-CM | POA: Diagnosis not present

## 2015-03-23 DIAGNOSIS — Z79899 Other long term (current) drug therapy: Secondary | ICD-10-CM

## 2015-03-23 DIAGNOSIS — E785 Hyperlipidemia, unspecified: Secondary | ICD-10-CM

## 2015-03-23 DIAGNOSIS — Z9861 Coronary angioplasty status: Secondary | ICD-10-CM

## 2015-03-23 DIAGNOSIS — M7989 Other specified soft tissue disorders: Secondary | ICD-10-CM

## 2015-03-23 DIAGNOSIS — I209 Angina pectoris, unspecified: Secondary | ICD-10-CM

## 2015-03-23 DIAGNOSIS — R1012 Left upper quadrant pain: Secondary | ICD-10-CM

## 2015-03-23 MED ORDER — LISINOPRIL 20 MG PO TABS: 20.0000 mg | ORAL_TABLET | Freq: Every day | ORAL | Status: DC

## 2015-03-23 MED ORDER — FUROSEMIDE 20 MG PO TABS: ORAL_TABLET | ORAL | Status: DC

## 2015-03-23 NOTE — Patient Instructions (Signed)
TAKE LASIX (FUROSEMIDE 1/2 TABLET OF 20 MG EVERY OTHER DAY) OR AS NEEDED.   INCREASE TO LISINOPRIL 20 MG DAILY   LABS--CMP,LIPID  IN NOV 2016- WILL MAIL LABSLIP TO YOU  Your physician wants you to follow-up in  April /MAY 2017 WITH DR [facility].  You will receive a reminder letter in the mail two months in advance. If you don't receive a letter, please call our office to schedule the follow-up appointment.

## 2015-03-23 NOTE — Progress Notes (Signed)
PCP: [name], MD  Clinic Note: Chief Complaint  Patient presents with  . Follow-up    retaining fluid  . Coronary Artery Disease    Non-STEMI    HPI: Jacqueline Munoz is a 54 y.o. female with a PMH below who presents today for 3 months f/u for CAD-PCI in setting of NSTEMI.   She had bifurcation PCI to circumflex-OM  In the setting of a non-STEMI. She then returned for staged PCI of the proximal LAD overlapping the mid LAD stent.  She has an occluded RCA. She was converted from Brilinta to Plavix last visit for financial concerns.  In addition to her atrial pain, she really noted significant left-sided breast /upper quadrant pain. She's had normal mammograms and breast ultrasound. Chest CT did not show anything. It only revealed multiple lung nodules.  Jacqueline Munoz was last seen on June 23rd 2016  Recent Hospitalizations: none  Studies Reviewed:   CT Chest: multiple calcified and noncalcified nodules in right lung. Largest is 6 mm. Likely represent calcified or noncalcified granuloma. - Recommend followup chest CT in 6-12 months  Interval History: Jacqueline Munoz is Doing relatively well overall from a cardiac standpoint. Her biggest issue is this pain in the left upper quadrant the left breast area. She still has a little constipation that is better. She quit smoking since I last saw her and has been doing well with that. Less of a cough in the morning. She has not had any anginal chest pressure at rest or exertion, but does get exertional dyspnea from her long-term smoking. Routine activity and even light exercises I gave her significant dyspnea. She has been trying to increase her exercise level, but is limited by her general body pain. She is noted to she gets swelling in her hands and feet, denies any PND or orthopnea. She takes Lasix she feels dehydrated next day --> so she has stopped taking Lasix.  No palpitations, lightheadedness, dizziness, weakness or syncope/near  syncope. No TIA/amaurosis fugax symptoms. No melena, hematochezia, hematuria, or epstaxis. She has mild symptoms that may suggest claudication, but are not limiting..  ROS: A comprehensive was performed. Review of Systems  Constitutional: Positive for malaise/fatigue (He just feels one down and tired.  He had pain.).  Respiratory: Positive for cough ( he stll hhas morning cough, much better since she has not smoked for over a montth.).   Gastrointestinal: Positive for heartburn, nausea, abdominal pain (left upper quadrant pain.  Has been through significant amount of evaluation. CT scan did not show anything.) and constipation. Negative for blood in stool and melena.  Genitourinary: Negative for hematuria.  Musculoskeletal: Positive for myalgias (Overall body aches. Most notably in the left upper quadrant.).  Neurological: Positive for dizziness.  Psychiatric/Behavioral: The patient is nervous/anxious.   All other systems reviewed and are negative.   Past Medical History  Diagnosis Date  . Fibromyalgia  Dx 2004  . Tobacco abuse     At 54 years old  . Hypothyroid Dx 2004  . Family history of adverse reaction to anesthesia     "mother died twice during surgery; they brought her back both times" (11/15/2014)  . Sleep apnea     "dx'd; don't wear a mask" (11/15/2014)  . History of stomach ulcers 1981  . Migraine     "couple/month" (11/15/2014)  . Headache     "@ least weekly" (11/15/2014)  . Arthritis     "hips, ankles, knees, hands, back" (11/15/2014)  . Chronic back pain     "  all over"  . Anxiety     "social"  . Chronic depression     At age of 54years old  . Kidney stones     "I've had several"  . Poorly controlled type 2 diabetes mellitus with circulatory disorder 11/15/2014  . Essential hypertension 11/15/2014  . Hyperlipidemia associated with type 2 diabetes mellitus 11/15/2014  . NSTEMI (non-ST elevated myocardial infarction) 11/15/2014  . CAD S/P percutaneous coronary  angioplasty 5/18/'16 ; 6/3/'16    a) NSTEMI: m-d LAD 90% -->0% Post PCI - 2 overlap DES ( Promus Premier DES 3.0 x 16 - 2.75  x 16), m-dCx 95%--> 0% post DES PCI (Synergy DES 2.25 x 38 -mini-crushed @ Ostial OM1 , Ostial-prox OM1 90% -> 0% post DES PCI (Synergy DES 2.75 x 24 -> 3.0 mm) - bifurcation minicrush of mCx stent;; b) UA/ACS: mLAD upstream - 3rd overlapping Promus DES 3.0 x 20 (3.25 mm)   . Coronary artery chronic total occlusion: 100% RCA CTO 11/16/2014    Prox RCA to Mid RCA lesion, 100% stenosed.   . Type IVa MI - Peri-PCI 11/17/2014    Prolonged ischemia during bifurcation PCI of  m-d Cx-OM1 with minicrush technique   Past Surgical History  Procedure Laterality Date  . Cesarean section  2000  . Inner ear surgery Bilateral "several"  . Tonsillectomy    . Total abdominal hysterectomy  ~ 2008  . Tubal ligation  2000  . Cardiac catheterization N/A 11/16/2014    Procedure: Left Heart Cath and Coronary Angiography;  Surgeon: Peter M Martinique, MD;  Location: [facility];  Service: Cardiovascular;  RCA 100% CTO, m-dLAD 90%, m-dCx 95%, OM1 90%  . Cardiac catheterization  11/16/2014    Procedure: Coronary Stent Intervention;  Surgeon: Peter M Martinique, MD;  Location: [facility];  Service: Cardiovascular;;m-d LAD overlapping 3.0 x 16 & 2.75 x 16 Promus P DES; OM1-dCx Minicrush bifurcation (dCx 2.25 x 38 Synergy DES crushed with 2.75 x 24 Synergy DES in OM1)   . Cardiac catheterization N/A 12/02/2014    Procedure: Left Heart Cath and Coronary Angiography;  Surgeon: Leonie Man, MD;  Location: [facility];  Service: Cardiovascular;  Previous Stents in LAD & Cx-OM patent, mLAD prox to stents 99%  . Cardiac catheterization N/A 12/02/2014    Procedure: Coronary Stent Intervention;  Surgeon: Leonie Man, MD;  Location: [facility];  Service: Cardiovascular;  3rd overlapping (prox) mLAD Promus Premier DES 3.0 x 20 (3.25 mm)  . Transthoracic echocardiogram  5/19 & 6/ 2016     a) EF 65-70%, mild LVH, Gr 1 DD; b) EF 60% -ron RWMA, no valve lesions     Prior to Admission medications   Medication Sig Start Date End Date Taking? Authorizing Dima Mini  aspirin EC 81 MG EC tablet Take 1 tablet (81 mg total) by mouth daily. 11/18/14  Yes Debbe Odea, MD  atorvastatin (LIPITOR) 80 MG tablet Take 1 tablet (80 mg total) by mouth daily at 6 PM. 12/14/14  Yes Arnoldo Morale, MD  carvedilol (COREG) 3.125 MG tablet Take 1 tablet (3.125 mg total) by mouth 2 (two) times daily with a meal. 03/20/15  Yes Josalyn Funches, MD  clopidogrel (PLAVIX) 75 MG tablet Take 1 tablet (75 mg total) by mouth daily. 01/05/15  Yes Boykin Nearing, MD  Elastic Bandages & Supports (MEDICAL COMPRESSION STOCKINGS) MISC Wear daily 30 mm Hg pressure 03/20/15  Yes Josalyn Funches, MD  furosemide (LASIX) 20 MG  tablet Take 20 mg by mouth.   Yes Historical Karyna Bessler, MD  glucose monitoring kit (FREESTYLE) monitoring kit 1 each by Does not apply route 4 (four) times daily - after meals and at bedtime. 1 month Diabetic Testing Supplies for QAC-QHS accuchecks. 11/16/14  Yes Debbe Odea, MD  ibuprofen (ADVIL,MOTRIN) 200 MG tablet Take 800 mg by mouth every 8 (eight) hours as needed for mild pain or moderate pain (Back pain).    Yes Historical Damilola Flamm, MD  insulin aspart (NOVOLOG FLEXPEN) 100 UNIT/ML FlexPen Inject 2-15 Units into the skin 3 (three) times daily with meals. Correction coverage: Moderate (average weight, post-op)     CBG < 70: implement hypoglycemia protocol    CBG 70 - 120: 0 units    CBG 121 - 150: 2 units    CBG 151 - 200: 3 units    CBG 201 - 250: 5 units    CBG 251 - 300: 8 units    CBG 301 - 350: 11 units    CBG 351 - 400: 15 units    CBG > 400 call MD and obtain STAT lab verification    01/25/15  Yes Tresa Garter, MD  Insulin Detemir (LEVEMIR FLEXPEN) 100 UNIT/ML Pen Inject 35 Units into the skin at bedtime. 01/25/15  Yes Tresa Garter, MD  Insulin Syringe-Needle  U-100 (TRUEPLUS INSULIN SYRINGE) 30G X 5/16" 0.5 ML MISC 1 each by Does not apply route 4 (four) times daily. 03/02/15  Yes Josalyn Funches, MD  isosorbide mononitrate (IMDUR) 30 MG 24 hr tablet Take 1 tablet (30 mg total) by mouth daily. 12/22/14  Yes Leonie Man, MD  levothyroxine (SYNTHROID, LEVOTHROID) 175 MCG tablet Take 1 tablet (175 mcg total) by mouth daily before breakfast. 01/25/15  Yes Tresa Garter, MD  lisinopril (PRINIVIL,ZESTRIL) 10 MG tablet Take 1 tablet (10 mg total) by mouth daily. 03/20/15  Yes Boykin Nearing, MD  meclizine (ANTIVERT) 25 MG tablet Take 1 tablet (25 mg total) by mouth 2 (two) times daily as needed for dizziness. 12/22/14  Yes Leonie Man, MD  metFORMIN (GLUCOPHAGE) 500 MG tablet Take 1 tablet (500 mg total) by mouth 2 (two) times daily with a meal. 03/20/15  Yes Josalyn Funches, MD  nicotine (NICODERM CQ - DOSED IN MG/24 HOURS) 21 mg/24hr patch Place 21 mg onto the skin daily.   Yes Historical Samin Milke, MD  nitroGLYCERIN (NITROSTAT) 0.4 MG SL tablet Place 0.4 mg under the tongue every 5 (five) minutes as needed for chest pain (may take three doses by mouth per day as needed).   Yes Historical Rocko Fesperman, MD  ticagrelor (BRILINTA) 90 MG TABS tablet Take 1 tablet (90 mg total) by mouth 2 (two) times daily. 01/25/15  Yes Tresa Garter, MD  Vitamin D, Ergocalciferol, (DRISDOL) 50000 UNITS CAPS capsule Take 1 capsule (50,000 Units total) by mouth every 7 (seven) days. For 8 weeks 01/06/15  Yes Boykin Nearing, MD   Allergies  Allergen Reactions  . Corticosteroids Anaphylaxis    ALL steroids.     . Penicillins Anaphylaxis    Social History   Social History  . Marital Status: Widowed    Spouse Name: N/A  . Number of Children: N/A  . Years of Education: N/A   Social History Main Topics  . Smoking status: Current Every Day Smoker -- 1.50 packs/day for 39 years    Types: Cigarettes  . Smokeless tobacco: Never Used  . Alcohol Use: No     Comment:  11/15/2014 "might have a few drinks/yr"  . Drug Use: Yes    Special: Marijuana     Comment: last marijuana use 03/18/2015  . Sexual Activity: Not Asked   Other Topics Concern  . None   Social History Narrative   Family History  Problem Relation Age of Onset  . Hyperlipidemia Mother   . Sudden death Father   . Emphysema Maternal Grandmother     smoked    Wt Readings from Last 3 Encounters:  03/24/15 203 lb 3.2 oz (92.171 kg)  03/23/15 202 lb (91.627 kg)  03/20/15 201 lb (91.173 kg)    PHYSICAL EXAM BP 152/82 mmHg  Pulse 68  Ht _0  (1.575 m)  Wt 202 lb (91.627 kg)  BMI 36.94 kg/m2  SpO2 98% General appearance: alert, cooperative, appears stated age, no distress, moderately obese and Pleasant mood and affect. Answers questions appropriately Neck: no adenopathy, no carotid bruit, no JVD, supple, symmetrical, trachea midline and thyroid not enlarged, symmetric, no tenderness/mass/nodules Lungs: clear to auscultation bilaterally, normal percussion bilaterally and Mild interstitial lung sounds in the bases. But no wheezes rales or rhonchi Heart: regular rate and rhythm, S1, S2 normal, no murmur, click, rub or gallop and normal apical impulse Abdomen: Mildly obese, soft, persistent mild tenderness in the left upper quadrant and epigastric area with no rebound. Nondistended Extremities: extremities normal, atraumatic, no cyanosis or edema Pulses: 2+ and symmetric Neurologic: Alert and oriented X 3, normal strength and tone. Normal symmetric reflexes. Normal coordination and gait Mental status: Alert, oriented, thought content appropriate Cranial nerves: normal    Adult ECG Report  Not checked.  [place] studies Reviewed: Additional studies/ records that were reviewed today include:  Recent Labs:  None since May 2016 Lab Results  Component Value Date   CHOL 281* 11/16/2014   HDL 33* 11/16/2014   LDLCALC UNABLE TO CALCULATE IF TRIGLYCERIDE OVER 400 mg/dL 11/16/2014    LDLDIRECT 56 12/02/2014   TRIG 561* 11/16/2014   CHOLHDL 8.5 11/16/2014    ASSESSMENT / PLAN: Problem List Items Addressed This Visit    Abdominal pain, left upper quadrant (Chronic)    This is really the main concern for her. Once the symptoms relieved, she is probably able to get back into a routine exercise.      Angina, class III    Note for further symptoms. This was suggestive she does not have significant ischemia from her occluded RCA. Despite having included RCA, she does not have evidence of an RCA infarct with normal wall motion on echo      Relevant Medications   nitroGLYCERIN (NITROSTAT) 0.4 MG SL tablet   furosemide (LASIX) 20 MG tablet   CAD S/P DES PCI to LAD (3) & OM1-dCx bifurcation (minicrush) (Chronic)    Both her LAD and circumflex-OM system were revascularized previously with stents. She is occluded RCA, but no active anginal symptoms now. She remains on aspirin plus Plavix with no significant bleeding issues. She is on high-dose statin along with beta blocker, ACE inhibitor and Imdur.  Based on her existing disease and number of stents, location of bifurcation stents, I would probably continue Plavix lifelong, to stop aspirin after one year. She has quit smoking which is crucial to keep the stents open.      Relevant Medications   nitroGLYCERIN (NITROSTAT) 0.4 MG SL tablet   furosemide (LASIX) 20 MG tablet   Essential hypertension - Primary (Chronic)    Inadequate blood pressure control today. Maybe because she is  in pain, but still needs better control. Increase lisinopril to 20 mg.      Relevant Medications   nitroGLYCERIN (NITROSTAT) 0.4 MG SL tablet   furosemide (LASIX) 20 MG tablet   Other Relevant Orders   Lipid panel   Comprehensive metabolic panel   Former very heavy cigarette smoker (more than 40 per day)    Smoking cessation instruction/counseling given:  commended patient for quitting and reviewed strategies for preventing relapses        Hyperlipidemia associated with type 2 diabetes mellitus (Chronic)    On high dose statin. She should get labs rechecked in the next month or so. She had high triglycerides during her initial evaluation were improved with BP checks on LDL 56. His insurer about what happened so quickly, would recheck lipids soon as she is on statin dose.      Relevant Medications   nitroGLYCERIN (NITROSTAT) 0.4 MG SL tablet   furosemide (LASIX) 20 MG tablet   Other Relevant Orders   Lipid panel   Comprehensive metabolic panel   Leg swelling    My recommendation is for her to take 10 mg  At least every other day and when necessary for worsening. She needs to stay adequately hydrated.       Other Visit Diagnoses    Polypharmacy        Relevant Orders    Lipid panel    Comprehensive metabolic panel       Current medicines are reviewed at length with the patient today. (+/- concerns) feels dehydrated with Lasix The following changes have been made:   TAKE LASIX (FUROSEMIDE 1/2 TABLET OF 20 MG EVERY OTHER DAY) OR AS NEEDED.   INCREASE TO LISINOPRIL 20 MG DAILY   LABS--CMP,LIPID  IN NOV 2016- WILL MAIL LABSLIP TO YOU  Your physician wants you to follow-up in  April /MAY 2017 WITH DR [facility].  Studies Ordered:   Orders Placed This Encounter  Procedures  . Lipid panel  . Comprehensive metabolic panel      Leonie Man, M.D., M.S. Interventional Cardiologist   Pager # [phone]

## 2015-03-24 ENCOUNTER — Other Ambulatory Visit: Payer: Self-pay | Admitting: Family Medicine

## 2015-03-24 ENCOUNTER — Encounter: Payer: Self-pay | Admitting: Cardiology

## 2015-03-24 ENCOUNTER — Encounter: Payer: Self-pay | Admitting: Internal Medicine

## 2015-03-24 ENCOUNTER — Ambulatory Visit (INDEPENDENT_AMBULATORY_CARE_PROVIDER_SITE_OTHER): Payer: Medicare Other | Admitting: Internal Medicine

## 2015-03-24 VITALS — BP 112/70 | HR 76 | Ht 62.0 in | Wt 203.2 lb

## 2015-03-24 DIAGNOSIS — I208 Other forms of angina pectoris: Secondary | ICD-10-CM

## 2015-03-24 DIAGNOSIS — F17210 Nicotine dependence, cigarettes, uncomplicated: Secondary | ICD-10-CM

## 2015-03-24 DIAGNOSIS — Z720 Tobacco use: Secondary | ICD-10-CM | POA: Diagnosis not present

## 2015-03-24 DIAGNOSIS — R05 Cough: Secondary | ICD-10-CM

## 2015-03-24 DIAGNOSIS — R058 Other specified cough: Secondary | ICD-10-CM

## 2015-03-24 DIAGNOSIS — E669 Obesity, unspecified: Secondary | ICD-10-CM

## 2015-03-24 DIAGNOSIS — B0229 Other postherpetic nervous system involvement: Secondary | ICD-10-CM

## 2015-03-24 DIAGNOSIS — I10 Essential (primary) hypertension: Secondary | ICD-10-CM

## 2015-03-24 DIAGNOSIS — R911 Solitary pulmonary nodule: Secondary | ICD-10-CM

## 2015-03-24 MED ORDER — OLMESARTAN MEDOXOMIL 20 MG PO TABS: 20.0000 mg | ORAL_TABLET | Freq: Every day | ORAL | Status: DC

## 2015-03-24 MED ORDER — OXYCODONE-ACETAMINOPHEN 5-325 MG PO TABS: 1.0000 | ORAL_TABLET | ORAL | Status: DC | PRN

## 2015-03-24 NOTE — Assessment & Plan Note (Signed)
Smoking cessation instruction/counseling given:  commended patient for quitting and reviewed strategies for preventing relapses 

## 2015-03-24 NOTE — Assessment & Plan Note (Signed)
On high dose statin. She should get labs rechecked in the next month or so. She had high triglycerides during her initial evaluation were improved with BP checks on LDL 56. His insurer about what happened so quickly, would recheck lipids soon as she is on statin dose.

## 2015-03-24 NOTE — Assessment & Plan Note (Signed)
Inadequate blood pressure control today. Maybe because she is in pain, but still needs better control. Increase lisinopril to 20 mg.

## 2015-03-24 NOTE — Assessment & Plan Note (Signed)
Both her LAD and circumflex-OM system were revascularized previously with stents. She is occluded RCA, but no active anginal symptoms now. She remains on aspirin plus Plavix with no significant bleeding issues. She is on high-dose statin along with beta blocker, ACE inhibitor and Imdur.  Based on her existing disease and number of stents, location of bifurcation stents, I would probably continue Plavix lifelong, to stop aspirin after one year. She has quit smoking which is crucial to keep the stents open.

## 2015-03-24 NOTE — Assessment & Plan Note (Addendum)
Note for further symptoms. This was suggestive she does not have significant ischemia from her occluded RCA. Despite having included RCA, she does not have evidence of an RCA infarct with normal wall motion on echo

## 2015-03-24 NOTE — Patient Instructions (Addendum)
Stop lisinipril and take benicar 20 mg daily   Percocet -5  One-two  every 4 hours as needed for pain   Zostrix cream apply 4 x daily right where it hurts (over the counter)  Please schedule a follow up office visit in 2 weeks, sooner if needed  With all active medications in hand

## 2015-03-24 NOTE — Assessment & Plan Note (Signed)
This is really the main concern for her. Once the symptoms relieved, she is probably able to get back into a routine exercise.

## 2015-03-24 NOTE — Assessment & Plan Note (Signed)
My recommendation is for her to take 10 mg  At least every other day and when necessary for worsening. She needs to stay adequately hydrated.

## 2015-03-24 NOTE — Progress Notes (Signed)
Subjective:    Patient ID: Jacqueline Munoz, female    DOB: 1961-06-20,    MRN: [id number]  HPI  71 yowf active smoker ear problems as child-  grew up in house full of smokers- but no other resp issues until in 1988 when moved to [place] from [place] with lots of rhitinits seasonally could not tolerate allergy shots generally better as she's aged but stills some  troubles in spring >> referred to pulmonary clinic 03/24/2015 by Dr Adrian Blackwater with  a new problem with sob esp in am since 12/2014        03/24/2015 1st [facility] office visit/ [facility]  Still smoking/ on ACEi  Chief Complaint  Patient presents with  . Pulmonary Consult    Referred by Dr. Boykin Nearing. Pt c/o SOB for the past 3 months. She feels SOB mainly when she wakes up in the am's.    shingles around T6/8 on L several years prior to OV  Then cp unexplained under L breast ever since, new sob x July 2016 assoc with severe hoarseness and chest congestion esp in ams though really not much am mucus and nothing purulent and better breathing during the day but profound voice fatigue since onset of sob noted.   No obvious day to day or daytime variabilty or assoc   chest tightness, subjective wheeze overt sinus or hb symptoms. No unusual exp hx or h/o childhood pna/ asthma or knowledge of premature birth.  Sleeping ok without nocturnal  or early am exacerbation  of respiratory  c/o's or need for noct saba. Also denies any obvious fluctuation of symptoms with weather or environmental changes or other aggravating or alleviating factors except as outlined above   Current Medications, Allergies, Complete Past Medical History, Past Surgical History, Family History, and Social History were reviewed in [employer] record.         Review of Systems  Constitutional: Negative for fever, chills and unexpected weight change.  HENT: Positive for ear pain and voice change. Negative for congestion, dental problem, nosebleeds,  postnasal drip, rhinorrhea, sinus pressure, sneezing, sore throat and trouble swallowing.   Eyes: Negative for visual disturbance.  Respiratory: Positive for shortness of breath. Negative for cough and choking.   Cardiovascular: Negative for chest pain and leg swelling.  Gastrointestinal: Positive for abdominal pain. Negative for vomiting and diarrhea.  Genitourinary: Negative for difficulty urinating.  Musculoskeletal: Negative for arthralgias.  Skin: Negative for rash.  Neurological: Positive for headaches. Negative for tremors and syncope.  Hematological: Does not bruise/bleed easily.       Objective:   Physical Exam   Extremely hoarse wf nad with prominent pseudowheeze  Wt Readings from Last 3 Encounters:  03/24/15 203 lb 3.2 oz (92.171 kg)  03/23/15 202 lb (91.627 kg)  03/20/15 201 lb (91.173 kg)    Vital signs reviewed   HEENT: nl   turbinates, and orophanx. Nl external ear canals without cough reflex - edentulous    NECK :  without JVD/Nodes/TM/ nl carotid upstrokes bilaterally   LUNGS: no acc muscle use, clear to A and P bilaterally without cough on insp or exp maneuvers   CV:  RRR  no s3 or murmur or increase in P2, no edema   ABD:  soft and nontender with nl excursion in the supine position. No bruits or organomegaly, bowel sounds nl  MS:  warm without deformities, calf tenderness, cyanosis or clubbing  SKIN: warm and dry without lesions    NEURO:  alert, approp, no deficits     CT chest  02/10/15  Multiple calcified and noncalcified nodules predominately within the right lung. The largest of these measures approximately 6 mm. These likely represent calcified and noncalcified granulomas although given the lack of prior exam for comparison followup is recommended as described below.      Assessment & Plan:

## 2015-03-25 DIAGNOSIS — R05 Cough: Secondary | ICD-10-CM | POA: Insufficient documentation

## 2015-03-25 DIAGNOSIS — R058 Other specified cough: Secondary | ICD-10-CM | POA: Insufficient documentation

## 2015-03-25 DIAGNOSIS — B0229 Other postherpetic nervous system involvement: Secondary | ICD-10-CM | POA: Insufficient documentation

## 2015-03-25 DIAGNOSIS — E669 Obesity, unspecified: Secondary | ICD-10-CM | POA: Insufficient documentation

## 2015-03-25 NOTE — Assessment & Plan Note (Signed)

## 2015-03-25 NOTE — Assessment & Plan Note (Signed)
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

## 2015-03-25 NOTE — Assessment & Plan Note (Signed)
ACE inhibitors are problematic in  pts with airway complaints because  even experienced pulmonologists can't always distinguish ace effects from copd/asthma.  By themselves they don't actually cause a problem, much like oxygen can't by itself start a fire, but they certainly serve as a powerful catalyst or enhancer for any "fire"  or inflammatory process in the upper airway, be it caused by an ET  tube or more commonly reflux (especially in the obese or pts with known GERD or who are on biphoshonates).  Her profound hoarseness and pseudowheeze are classic for acei effects until proven otherwise though not clear what the trigger may have been.    In the era of ARB near equivalency until we have a better handle on the reversibility of the airway problem, it just makes sense to avoid ACEI  entirely in the short run and then decide later, having established a level of airway control using a reasonable limited regimen, whether to add back ace but even then being very careful to observe the pt for worsening airway control and number of meds used/ needed to control symptoms.    Try benicar 20 mg daily and recheck bp in 2 weeks

## 2015-03-25 NOTE — Assessment & Plan Note (Signed)
Found on CT scan 02/10/2015. > in tickle for f/u 08/12/14   Discussed in detail all the  indications, usual  risks and alternatives  relative to the benefits with patient who agrees to proceed with conservative f/u as outlined

## 2015-03-25 NOTE — Assessment & Plan Note (Signed)
Body mass index is 37.16    Lab Results  Component Value Date   TSH 0.246* 01/05/2015     Contributing to gerd tendency/ doe/reviewed the need and the process to achieve and maintain neg calorie balance > defer f/u primary care including intermittently monitoring thyroid status

## 2015-03-26 NOTE — Assessment & Plan Note (Signed)
The most common causes of chronic cough in immunocompetent adults include the following: upper airway cough syndrome (UACS), previously referred to as postnasal drip syndrome (PNDS), which is caused by variety of rhinosinus conditions; (2) asthma; (3) GERD; (4) chronic bronchitis from cigarette smoking or other inhaled environmental irritants; (5) nonasthmatic eosinophilic bronchitis; and (6) bronchiectasis.   These conditions, singly or in combination, have accounted for up to 94% of the causes of chronic cough in prospective studies.   Other conditions have constituted no >6% of the causes in prospective studies These have included bronchogenic carcinoma, chronic interstitial pneumonia, sarcoidosis, left ventricular failure, ACEI-induced cough, and aspiration from a condition associated with pharyngeal dysfunction.    Chronic cough is often simultaneously caused by more than one condition. A single cause has been found from 38 to 82% of the time, multiple causes from 18 to 62%. Multiply caused cough has been the result of three diseases up to 42% of the time.       Based on hx and exam, this is most likely:  Classic Upper airway cough syndrome, so named because it's frequently impossible to sort out how much is  CR/sinusitis with freq throat clearing (which can be related to primary GERD)   vs  causing  secondary (" extra esophageal")  GERD from wide swings in gastric pressure that occur with throat clearing, often  promoting self use of mint and menthol lozenges that reduce the lower esophageal sphincter tone and exacerbate the problem further in a cyclical fashion.   These are the same pts (now being labeled as having "irritable larynx syndrome" by some cough centers) who not infrequently have a history of having failed to tolerate ace inhibitors,  dry powder inhalers or biphosphonates or report having atypical reflux symptoms that don't respond to standard doses of PPI , and are easily confused as  having aecopd or asthma flares by even experienced allergists/ pulmonologists.   The first step is to  eliminate ACEi/ cyclical coughing then regroup if the cough persists wnd try max gerd rx next   I had an extended discussion with the patient reviewing all relevant studies completed to date and  lasting 35 min      2) Each maintenance medication was reviewed in detail including most importantly the difference between maintenance and prns and under what circumstances the prns are to be triggered using an action plan format that is not reflected in the computer generated alphabetically organized AVS.    Please see instructions for details which were reviewed in writing and the patient given a copy highlighting the part that I personally wrote and discussed at today's ov.   See instructions for specific recommendations which were reviewed directly with the patient who was given a copy with highlighter outlining the key components

## 2015-03-27 NOTE — Telephone Encounter (Signed)
LVM to return call   Please let pt know Rx elastic bandagas & support at front office ready to be pick up

## 2015-03-28 NOTE — Telephone Encounter (Signed)
Patient came in requesting a medication refill for Insulin Detemir (LEVEMIR FLEXPEN) Please follow up.

## 2015-03-30 ENCOUNTER — Telehealth: Payer: Self-pay | Admitting: *Deleted

## 2015-03-30 DIAGNOSIS — E1165 Type 2 diabetes mellitus with hyperglycemia: Secondary | ICD-10-CM

## 2015-03-30 DIAGNOSIS — J383 Other diseases of vocal cords: Secondary | ICD-10-CM | POA: Diagnosis not present

## 2015-03-30 DIAGNOSIS — H7292 Unspecified perforation of tympanic membrane, left ear: Secondary | ICD-10-CM | POA: Diagnosis not present

## 2015-03-30 DIAGNOSIS — E1151 Type 2 diabetes mellitus with diabetic peripheral angiopathy without gangrene: Secondary | ICD-10-CM

## 2015-03-30 DIAGNOSIS — R490 Dysphonia: Secondary | ICD-10-CM | POA: Diagnosis not present

## 2015-03-30 MED ORDER — INSULIN DETEMIR 100 UNIT/ML FLEXPEN: 35.0000 [IU] | PEN_INJECTOR | Freq: Every day | SUBCUTANEOUS | Status: DC

## 2015-03-30 NOTE — Telephone Encounter (Signed)
Refilled levemir Removed novolog from med list Is there anything else needed. Was this just sent to me as an FYI?

## 2015-03-30 NOTE — Telephone Encounter (Signed)
Patient verified DOB Patient states Flexpen Levemir with first prescription. Patient states Levemir now comes in vials and she has to use medication with syringes.  Patient states she prefers Flexpen. Patient states she has not needed Novalog because her sugar is under control with only Levemir at night. Patient needs refill on Levemir.

## 2015-04-03 DIAGNOSIS — E669 Obesity, unspecified: Secondary | ICD-10-CM | POA: Diagnosis not present

## 2015-04-03 DIAGNOSIS — M545 Low back pain: Secondary | ICD-10-CM | POA: Diagnosis not present

## 2015-04-03 DIAGNOSIS — Z79899 Other long term (current) drug therapy: Secondary | ICD-10-CM | POA: Diagnosis not present

## 2015-04-03 DIAGNOSIS — M79606 Pain in leg, unspecified: Secondary | ICD-10-CM | POA: Diagnosis not present

## 2015-04-03 DIAGNOSIS — G894 Chronic pain syndrome: Secondary | ICD-10-CM | POA: Diagnosis not present

## 2015-04-03 DIAGNOSIS — M488X6 Other specified spondylopathies, lumbar region: Secondary | ICD-10-CM | POA: Diagnosis not present

## 2015-04-07 ENCOUNTER — Ambulatory Visit (INDEPENDENT_AMBULATORY_CARE_PROVIDER_SITE_OTHER): Payer: Medicare Other | Admitting: Internal Medicine

## 2015-04-07 ENCOUNTER — Encounter: Payer: Self-pay | Admitting: Internal Medicine

## 2015-04-07 VITALS — BP 112/84 | HR 87 | Ht 62.0 in | Wt 200.2 lb

## 2015-04-07 DIAGNOSIS — I10 Essential (primary) hypertension: Secondary | ICD-10-CM

## 2015-04-07 DIAGNOSIS — Z720 Tobacco use: Secondary | ICD-10-CM | POA: Diagnosis not present

## 2015-04-07 DIAGNOSIS — I209 Angina pectoris, unspecified: Secondary | ICD-10-CM

## 2015-04-07 DIAGNOSIS — F17210 Nicotine dependence, cigarettes, uncomplicated: Secondary | ICD-10-CM

## 2015-04-07 DIAGNOSIS — R058 Other specified cough: Secondary | ICD-10-CM

## 2015-04-07 DIAGNOSIS — R911 Solitary pulmonary nodule: Secondary | ICD-10-CM

## 2015-04-07 DIAGNOSIS — E669 Obesity, unspecified: Secondary | ICD-10-CM

## 2015-04-07 DIAGNOSIS — R05 Cough: Secondary | ICD-10-CM | POA: Diagnosis not present

## 2015-04-07 DIAGNOSIS — B0229 Other postherpetic nervous system involvement: Secondary | ICD-10-CM

## 2015-04-07 NOTE — Patient Instructions (Addendum)
Take benicar 40/12.5 one half daily until you see your primary medical doctor  The key is to stop smoking completely before smoking completely stops you - your lung function is perfectly normal so it's not too late   If you are satisfied with your treatment plan,  let your doctor know and he/she can either refill your medications or you can return here when your prescription runs out.     If in any way you are not 100% satisfied,  please tell us.  If 100% better, tell your friends!  Pulmonary follow up is as needed

## 2015-04-07 NOTE — Progress Notes (Signed)
Subjective:   Patient ID: Jacqueline Munoz, female    DOB: [birth year]   MRN: [id number]    Brief patient profile:  70 yowf active smoker ear problems as child-  grew up in house full of smokers- but no other resp issues until in 1988 when moved to [place] from [place] with lots of rhitinits seasonally could not tolerate allergy shots generally better as she's aged but stills some  troubles in spring >> referred to pulmonary clinic 03/24/2015 by Dr Adrian Blackwater with  a new problem with sob esp in am since 12/2014   With nl spirometry documented 04/07/2015 and vocal cord issues eval by Redmond Baseman and referred to [facility] for ? Laser rx?    History of Present Illness  03/24/2015 1st [facility] office visit/ [facility]  Still smoking/ on ACEi  Chief Complaint  Patient presents with  . Pulmonary Consult    Referred by Dr. Boykin Nearing. Pt c/o SOB for the past 3 months. She feels SOB mainly when she wakes up in the am's.    shingles around T6/8 on L several years prior to OV  Then cp unexplained under L breast ever since, new sob x July 2016 assoc with severe hoarseness and chest congestion esp in ams though really not much am mucus and nothing purulent and better breathing during the day but profound voice fatigue since onset of sob noted.  rec Stop lisinipril and take benicar 20 mg daily  Percocet -5  One-two  every 4 hours as needed for pain  Zostrix cream apply 4 x daily right where it hurts (over the counter) Please schedule a follow up office visit in 2 weeks, sooner if needed  With all active medications in hand     04/07/2015  f/u ov/[facility] re: uacs with nl spirometry   Chief Complaint  Patient presents with  . Follow-up    Pt states that her breathing seems some better since the last visit. No change in hoarseness- she has seen ENT (Dr Redmond Baseman) and was advised she needed surgery for this.    Being referred to [facility] for "laser surgery on throat" Also established at preferred pain clinic Still smoking but no no  inhalers needed at this point   No obvious day to day or daytime variability or assoc excess or purulent mucus  or   chest tightness, subjective wheeze or overt sinus or hb symptoms. No unusual exp hx or h/o childhood pna/ asthma or knowledge of premature birth.  Sleeping ok without nocturnal  or early am exacerbation  of respiratory  c/o's or need for noct saba. Also denies any obvious fluctuation of symptoms with weather or environmental changes or other aggravating or alleviating factors except as outlined above   Current Medications, Allergies, Complete Past Medical History, Past Surgical History, Family History, and Social History were reviewed in [employer] record.  ROS  The following are not active complaints unless bolded sore throat, dysphagia, dental problems, itching, sneezing,  nasal congestion or excess/ purulent secretions, ear ache,   fever, chills, sweats, unintended wt loss, classically pleuritic or exertional cp, hemoptysis,  orthopnea pnd or leg swelling, presyncope, palpitations, abdominal pain, anorexia, nausea, vomiting, diarrhea  or change in bowel or bladder habits, change in stools or urine, dysuria,hematuria,  rash, arthralgias, visual complaints, headache, numbness, weakness or ataxia or problems with walking or coordination,  change in mood/affect or memory.  Objective:   Physical Exam   Extremely hoarse wf nad with prominent pseudowheeze  04/07/2015       200    03/24/15 203 lb 3.2 oz (92.171 kg)  03/23/15 202 lb (91.627 kg)  03/20/15 201 lb (91.173 kg)    Vital signs reviewed   HEENT: nl   turbinates, and orophanx. Nl external ear canals without cough reflex - edentulous    NECK :  without JVD/Nodes/TM/ nl carotid upstrokes bilaterally   LUNGS: no acc muscle use, clear to A and P bilaterally without cough on insp or exp maneuvers   CV:  RRR  no s3 or murmur or increase in P2, no edema   ABD:  soft and  nontender with nl excursion in the supine position. No bruits or organomegaly, bowel sounds nl  MS:  warm without deformities, calf tenderness, cyanosis or clubbing  SKIN: warm and dry without lesions    NEURO:  alert, approp, no deficits     CT chest  02/10/15  Multiple calcified and noncalcified nodules predominately within the right lung. The largest of these measures approximately 6 mm. These likely represent calcified and noncalcified granulomas although given the lack of prior exam for comparison followup is recommended as described below.      Assessment & Plan:   Outpatient Encounter Prescriptions as of 04/07/2015  Medication Sig  . aspirin EC 81 MG EC tablet Take 1 tablet (81 mg total) by mouth daily.  [name] atorvastatin (LIPITOR) 80 MG tablet Take 1 tablet (80 mg total) by mouth daily at 6 PM.  . carvedilol (COREG) 3.125 MG tablet Take 1 tablet (3.125 mg total) by mouth 2 (two) times daily with a meal.  . clopidogrel (PLAVIX) 75 MG tablet Take 1 tablet (75 mg total) by mouth daily.  Water engineer Bandages & Supports (MEDICAL COMPRESSION STOCKINGS) MISC Wear daily 30 mm Hg pressure  . furosemide (LASIX) 20 MG tablet TAKE 1/2 TABLET EVERY OTHER DAY OR AS NEEDED  . glucose monitoring kit (FREESTYLE) monitoring kit 1 each by Does not apply route 4 (four) times daily - after meals and at bedtime. 1 month Diabetic Testing Supplies for QAC-QHS accuchecks.  [name] ibuprofen (ADVIL,MOTRIN) 200 MG tablet Take 800 mg by mouth every 8 (eight) hours as needed for mild pain or moderate pain (Back pain).   . Insulin Detemir (LEVEMIR FLEXPEN) 100 UNIT/ML Pen Inject 35 Units into the skin at bedtime.  . Insulin Syringe-Needle U-100 (TRUEPLUS INSULIN SYRINGE) 30G X 5/16" 0.5 ML MISC 1 each by Does not apply route 4 (four) times daily.  . isosorbide mononitrate (IMDUR) 30 MG 24 hr tablet Take 1 tablet (30 mg total) by mouth daily.  [name] levothyroxine (SYNTHROID, LEVOTHROID) 175 MCG tablet Take 1 tablet (175  mcg total) by mouth daily before breakfast.  . meclizine (ANTIVERT) 25 MG tablet Take 1 tablet (25 mg total) by mouth 2 (two) times daily as needed for dizziness.  . metFORMIN (GLUCOPHAGE) 500 MG tablet Take 1 tablet (500 mg total) by mouth 2 (two) times daily with a meal.  . nitroGLYCERIN (NITROSTAT) 0.4 MG SL tablet Place 0.4 mg under the tongue every 5 (five) minutes as needed for chest pain (may take three doses by mouth per day as needed).  . olmesartan (BENICAR) 20 MG tablet Take 1 tablet (20 mg total) by mouth daily.  [name] oxyCODONE-acetaminophen (ROXICET) 5-325 MG per tablet Take 1-2 tablets by mouth every 4 (four) hours as needed for severe pain. (Patient taking differently:  Take 1-2 tablets by mouth every 8 (eight) hours as needed for severe pain. )  . [DISCONTINUED] Vitamin D, Ergocalciferol, (DRISDOL) 50000 UNITS CAPS capsule Take 1 capsule (50,000 Units total) by mouth every 7 (seven) days. For 8 weeks  . [DISCONTINUED] nicotine (NICODERM CQ - DOSED IN MG/24 HOURS) 21 mg/24hr patch Place 21 mg onto the skin daily.   No facility-administered encounter medications on file as of 04/07/2015.

## 2015-04-08 ENCOUNTER — Encounter: Payer: Self-pay | Admitting: Internal Medicine

## 2015-04-08 NOTE — Assessment & Plan Note (Signed)
Body mass index is 36.61 trending down sltly  Lab Results  Component Value Date   TSH 0.246* 01/05/2015     Contributing to gerd tendency/ doe/reviewed the need and the process to achieve and maintain neg calorie balance > defer f/u primary care including intermittently monitoring thyroid status

## 2015-04-08 NOTE — Assessment & Plan Note (Addendum)
Trial off ACEi 03/24/2015  - spirometry 04/08/2015 wnl  - Referred to [facility] by Dr Redmond Baseman 04/2015 > records requested  Her symptoms are therefore explained by her upper airway ? Polyps >  and understands she needs laser resection next > referred to [facility] by ENT  She has no evidence of asthma or COPD at this point although she is at risk  I had an extended discussion with the patient reviewing all relevant studies completed to date and  lasting 15 to 20 minutes of a 25 minute visit    Each maintenance medication was reviewed in detail including most importantly the difference between maintenance and prns and under what circumstances the prns are to be triggered using an action plan format that is not reflected in the computer generated alphabetically organized AVS.    Please see instructions for details which were reviewed in writing and the patient given a copy highlighting the part that I personally wrote and discussed at today's ov.

## 2015-04-08 NOTE — Assessment & Plan Note (Signed)
Started zostrix 03/25/15 > established with pain clinic > no further f/u or narcs here

## 2015-04-08 NOTE — Assessment & Plan Note (Signed)

## 2015-04-08 NOTE — Assessment & Plan Note (Signed)
Change acei to arb 03/24/2015 due to hoarseness/pseudowheeze  Adequate control on present rx, reviewed > no change in rx needed  > would avoid acei completely in this setting

## 2015-04-08 NOTE — Assessment & Plan Note (Signed)
Found on CT scan 02/10/2015. > in tickle for f/u 08/13/15   Discussed in detail all the  indications, usual  risks and alternatives  relative to the benefits with patient who agrees to proceed with conservative f/u as outlined

## 2015-04-10 ENCOUNTER — Telehealth: Payer: Self-pay | Admitting: *Deleted

## 2015-04-10 NOTE — Telephone Encounter (Signed)
LMOM with Medical records at Dr Redmond Baseman office  Will await records

## 2015-04-10 NOTE — Telephone Encounter (Signed)
-----   Message from Tanda Rockers, MD sent at 04/08/2015  7:44 AM EDT ----- Need Dr Melida Quitter last ov

## 2015-04-17 NOTE — Telephone Encounter (Signed)
MW has reviewed the records req Nothing further needed

## 2015-04-20 ENCOUNTER — Telehealth: Payer: Self-pay | Admitting: Cardiology

## 2015-04-20 ENCOUNTER — Emergency Department (HOSPITAL_COMMUNITY): Payer: Medicare Other

## 2015-04-20 ENCOUNTER — Encounter (HOSPITAL_COMMUNITY): Payer: Self-pay | Admitting: Neurology

## 2015-04-20 ENCOUNTER — Inpatient Hospital Stay (HOSPITAL_COMMUNITY)
Admission: EM | Admit: 2015-04-20 | Discharge: 2015-04-21 | DRG: 303 | Disposition: A | Discharge status: Home / Self Care | Payer: Medicare Other | Attending: Internal Medicine | Admitting: Internal Medicine

## 2015-04-20 DIAGNOSIS — E1151 Type 2 diabetes mellitus with diabetic peripheral angiopathy without gangrene: Secondary | ICD-10-CM | POA: Diagnosis present

## 2015-04-20 DIAGNOSIS — F419 Anxiety disorder, unspecified: Secondary | ICD-10-CM | POA: Diagnosis present

## 2015-04-20 DIAGNOSIS — I10 Essential (primary) hypertension: Secondary | ICD-10-CM | POA: Diagnosis present

## 2015-04-20 DIAGNOSIS — I252 Old myocardial infarction: Secondary | ICD-10-CM | POA: Diagnosis not present

## 2015-04-20 DIAGNOSIS — Z6835 Body mass index (BMI) 35.0-35.9, adult: Secondary | ICD-10-CM

## 2015-04-20 DIAGNOSIS — I25110 Atherosclerotic heart disease of native coronary artery with unstable angina pectoris: Secondary | ICD-10-CM | POA: Diagnosis present

## 2015-04-20 DIAGNOSIS — G43909 Migraine, unspecified, not intractable, without status migrainosus: Secondary | ICD-10-CM | POA: Diagnosis present

## 2015-04-20 DIAGNOSIS — E1165 Type 2 diabetes mellitus with hyperglycemia: Secondary | ICD-10-CM | POA: Diagnosis present

## 2015-04-20 DIAGNOSIS — G894 Chronic pain syndrome: Secondary | ICD-10-CM | POA: Diagnosis present

## 2015-04-20 DIAGNOSIS — Z888 Allergy status to other drugs, medicaments and biological substances status: Secondary | ICD-10-CM | POA: Diagnosis not present

## 2015-04-20 DIAGNOSIS — M542 Cervicalgia: Secondary | ICD-10-CM | POA: Diagnosis not present

## 2015-04-20 DIAGNOSIS — I2510 Atherosclerotic heart disease of native coronary artery without angina pectoris: Secondary | ICD-10-CM | POA: Diagnosis not present

## 2015-04-20 DIAGNOSIS — R51 Headache: Secondary | ICD-10-CM | POA: Diagnosis not present

## 2015-04-20 DIAGNOSIS — R079 Chest pain, unspecified: Secondary | ICD-10-CM

## 2015-04-20 DIAGNOSIS — R0602 Shortness of breath: Secondary | ICD-10-CM | POA: Diagnosis not present

## 2015-04-20 DIAGNOSIS — Z87891 Personal history of nicotine dependence: Secondary | ICD-10-CM

## 2015-04-20 DIAGNOSIS — J449 Chronic obstructive pulmonary disease, unspecified: Secondary | ICD-10-CM | POA: Diagnosis present

## 2015-04-20 DIAGNOSIS — E039 Hypothyroidism, unspecified: Secondary | ICD-10-CM | POA: Diagnosis present

## 2015-04-20 DIAGNOSIS — E6609 Other obesity due to excess calories: Secondary | ICD-10-CM | POA: Diagnosis present

## 2015-04-20 DIAGNOSIS — R519 Headache, unspecified: Secondary | ICD-10-CM

## 2015-04-20 DIAGNOSIS — E782 Mixed hyperlipidemia: Secondary | ICD-10-CM | POA: Diagnosis present

## 2015-04-20 DIAGNOSIS — Z825 Family history of asthma and other chronic lower respiratory diseases: Secondary | ICD-10-CM | POA: Diagnosis not present

## 2015-04-20 DIAGNOSIS — I2582 Chronic total occlusion of coronary artery: Secondary | ICD-10-CM | POA: Diagnosis present

## 2015-04-20 DIAGNOSIS — Z955 Presence of coronary angioplasty implant and graft: Secondary | ICD-10-CM

## 2015-04-20 DIAGNOSIS — R911 Solitary pulmonary nodule: Secondary | ICD-10-CM | POA: Diagnosis present

## 2015-04-20 DIAGNOSIS — F329 Major depressive disorder, single episode, unspecified: Secondary | ICD-10-CM | POA: Diagnosis present

## 2015-04-20 DIAGNOSIS — Z880 Allergy status to penicillin: Secondary | ICD-10-CM | POA: Diagnosis not present

## 2015-04-20 DIAGNOSIS — E1169 Type 2 diabetes mellitus with other specified complication: Secondary | ICD-10-CM | POA: Insufficient documentation

## 2015-04-20 DIAGNOSIS — I959 Hypotension, unspecified: Secondary | ICD-10-CM | POA: Insufficient documentation

## 2015-04-20 LAB — URINALYSIS, ROUTINE W REFLEX MICROSCOPIC
BILIRUBIN URINE: NEGATIVE
GLUCOSE, UA: NEGATIVE mg/dL
KETONES UR: NEGATIVE mg/dL
Leukocytes, UA: NEGATIVE
Nitrite: NEGATIVE
PH: 5 (ref 5.0–8.0)
PROTEIN: NEGATIVE mg/dL
Specific Gravity, Urine: 1.02 (ref 1.005–1.030)
Urobilinogen, UA: 0.2 mg/dL (ref 0.0–1.0)

## 2015-04-20 LAB — CBC
HEMATOCRIT: 43.6 % (ref 36.0–46.0)
HEMOGLOBIN: 15 g/dL (ref 12.0–15.0)
MCH: 28.8 pg (ref 26.0–34.0)
MCHC: 34.4 g/dL (ref 30.0–36.0)
MCV: 83.8 fL (ref 78.0–100.0)
Platelets: 274 10*3/uL (ref 150–400)
RBC: 5.2 MIL/uL — AB (ref 3.87–5.11)
RDW: 13.8 % (ref 11.5–15.5)
WBC: 11.8 10*3/uL — ABNORMAL HIGH (ref 4.0–10.5)

## 2015-04-20 LAB — BASIC METABOLIC PANEL
ANION GAP: 14 (ref 5–15)
BUN: 12 mg/dL (ref 6–20)
CALCIUM: 9.9 mg/dL (ref 8.9–10.3)
CO2: 22 mmol/L (ref 22–32)
Chloride: 98 mmol/L — ABNORMAL LOW (ref 101–111)
Creatinine, Ser: 0.77 mg/dL (ref 0.44–1.00)
GLUCOSE: 310 mg/dL — AB (ref 65–99)
POTASSIUM: 3.9 mmol/L (ref 3.5–5.1)
Sodium: 134 mmol/L — ABNORMAL LOW (ref 135–145)

## 2015-04-20 LAB — URINE MICROSCOPIC-ADD ON

## 2015-04-20 LAB — I-STAT TROPONIN, ED
TROPONIN I, POC: 0.01 ng/mL (ref 0.00–0.08)
Troponin i, poc: 0.01 ng/mL (ref 0.00–0.08)

## 2015-04-20 LAB — BRAIN NATRIURETIC PEPTIDE: B Natriuretic Peptide: 17.4 pg/mL (ref 0.0–100.0)

## 2015-04-20 MED ORDER — OXYCODONE-ACETAMINOPHEN 5-325 MG PO TABS
1.0000 | ORAL_TABLET | Freq: Once | ORAL | Status: AC
  Administered 2015-04-20: 1 via ORAL
  Filled 2015-04-20: qty 1

## 2015-04-20 MED ORDER — IOHEXOL 350 MG/ML SOLN: 50.0000 mL | Freq: Once | INTRAVENOUS | Status: DC | PRN

## 2015-04-20 MED ORDER — IOHEXOL 350 MG/ML SOLN
50.0000 mL | Freq: Once | INTRAVENOUS | Status: AC | PRN
  Administered 2015-04-20: 50 mL via INTRAVENOUS

## 2015-04-20 MED ORDER — ASPIRIN 81 MG PO CHEW
324.0000 mg | CHEWABLE_TABLET | Freq: Once | ORAL | Status: AC
  Administered 2015-04-20: 324 mg via ORAL
  Filled 2015-04-20: qty 4

## 2015-04-20 MED ORDER — IOHEXOL 350 MG/ML SOLN
80.0000 mL | Freq: Once | INTRAVENOUS | Status: AC | PRN
  Administered 2015-04-20: 20:00:00 via INTRAVENOUS

## 2015-04-20 MED ORDER — METOCLOPRAMIDE HCL 5 MG/ML IJ SOLN
10.0000 mg | Freq: Once | INTRAMUSCULAR | Status: AC
  Administered 2015-04-20: 10 mg via INTRAVENOUS
  Filled 2015-04-20: qty 2

## 2015-04-20 MED ORDER — SODIUM CHLORIDE 0.9 % IV BOLUS (SEPSIS)
500.0000 mL | Freq: Once | INTRAVENOUS | Status: AC
  Administered 2015-04-20: 500 mL via INTRAVENOUS

## 2015-04-20 MED ORDER — DIPHENHYDRAMINE HCL 25 MG PO CAPS
25.0000 mg | ORAL_CAPSULE | Freq: Once | ORAL | Status: AC
  Administered 2015-04-20: 25 mg via ORAL
  Filled 2015-04-20: qty 1

## 2015-04-20 NOTE — ED Provider Notes (Signed)
Medical screening examination/treatment/procedure(s) were conducted as a shared visit with resident-physician practitioner(s) and myself.  I personally evaluated the patient during the encounter.   54yo female with hx of NSTEMI with 5 stents, CHF, COPD, hypertension, hyperlipidemia, presents with concern of chest pain and migraine.  HA is similar to prior migraines, started yesterday, gradual onset, doubt SAH.   Regarding chest pain, pt with tw inversion aVL, similar to prior, sinus tachycardia, no other acute findings, negative troponin.  Pt describes pain shooting at times and with BP diminished and CTA dissection study ordered with CTA neck given radiation of pain and showed no sign of dissection or evidence for PE.  Pt BP at times normal, at times borderline or hypotensive, with pt continuing to [facility] well, not have infectious symptoms or source, afebrile, no leukocytosis and doubt sepsis. No hx or sign of GI bleed, no new medications. Given pt pain, cardiac hx, BP decreased, will admit for continued evaluation.   EKG Interpretation   Date/Time:  Thursday April 20 2015 15:11:36 EDT Ventricular Rate:  104 PR Interval:  140 QRS Duration: 74 QT Interval:  346 QTC Calculation: 454 R Axis:   47 Text Interpretation:  Sinus tachycardia Otherwise normal ECG Since  previous ECG, heart rate has increased Confirmed by [facility] MD, [place]  ([id number]) on 04/20/2015 4:43:43 PM        Gareth Morgan, MD 04/21/15 1359

## 2015-04-20 NOTE — ED Provider Notes (Cosign Needed)
CSN: [id number]     Arrival date & time 04/20/15  1506 History   First MD Initiated Contact with Patient 04/20/15 1643     Chief Complaint  Patient presents with  . Chest Pain    The history is provided by the patient and medical records.   54 yo F with hx CAD s/p several DES and peri-PCI MI in May this year, COPD, HTN, HLD, hypothyroid, tobacco abuse, chronic pain, migraines, fibromyalgia presenting with chest pain. Endorses several days of flushing and fatigue, migraine (typical pattern for pt- left sided with left blurred vision that was gradual onset), left neck pain. Chest pain then developed. Onset was this morning. Located in substernal area. Radiating to left neck and arm as well as to back. Described as burning and pressure constantly but also sharp intermittently. Worse with exertion. Associated with SOB, nausea; unclear if associated with diaphoresis that is different from her recent flushing episodes. Reports CP different from her typical left chest wall/lower rib pain that she has been getting worked up and tx recently (thought to be from post herpetic neuralgia). Severe pain. Not alleviated by rest.    Past Medical History  Diagnosis Date  . Fibromyalgia  Dx 2004  . Tobacco abuse     At 54 years old  . Hypothyroid Dx 2004  . Family history of adverse reaction to anesthesia     "mother died twice during surgery; they brought her back both times" (11/15/2014)  . Sleep apnea     "dx'd; don't wear a mask" (11/15/2014)  . History of stomach ulcers 1981  . Migraine     "couple/month" (11/15/2014)  . Headache     "@ least weekly" (11/15/2014)  . Arthritis     "hips, ankles, knees, hands, back" (11/15/2014)  . Chronic back pain     "all over"  . Anxiety     "social"  . Chronic depression     At age of 54years old  . Kidney stones     "I've had several"  . Poorly controlled type 2 diabetes mellitus with circulatory disorder ([place]) 11/15/2014  . Essential hypertension 11/15/2014   . Hyperlipidemia associated with type 2 diabetes mellitus ([place]) 11/15/2014  . NSTEMI (non-ST elevated myocardial infarction) ([place]) 11/15/2014  . CAD S/P percutaneous coronary angioplasty 5/18/'16 ; 6/3/'16    a) NSTEMI: m-d LAD 90% -->0% Post PCI - 2 overlap DES ( Promus Premier DES 3.0 x 16 - 2.75  x 16), m-dCx 95%--> 0% post DES PCI (Synergy DES 2.25 x 38 -mini-crushed @ Ostial OM1 , Ostial-prox OM1 90% -> 0% post DES PCI (Synergy DES 2.75 x 24 -> 3.0 mm) - bifurcation minicrush of mCx stent;; b) UA/ACS: mLAD upstream - 3rd overlapping Promus DES 3.0 x 20 (3.25 mm)   . Coronary artery chronic total occlusion: 100% RCA CTO 11/16/2014    Prox RCA to Mid RCA lesion, 100% stenosed.   . Type IVa MI - Peri-PCI 11/17/2014    Prolonged ischemia during bifurcation PCI of  m-d Cx-OM1 with minicrush technique   Past Surgical History  Procedure Laterality Date  . Cesarean section  2000  . Inner ear surgery Bilateral "several"  . Tonsillectomy    . Total abdominal hysterectomy  ~ 2008  . Tubal ligation  2000  . Cardiac catheterization N/A 11/16/2014    Procedure: Left Heart Cath and Coronary Angiography;  Surgeon: Peter M Martinique, MD;  Location: [facility];  Service: Cardiovascular;  RCA 100% CTO,  m-dLAD 90%, m-dCx 95%, OM1 90%  . Cardiac catheterization  11/16/2014    Procedure: Coronary Stent Intervention;  Surgeon: Peter M Martinique, MD;  Location: [facility];  Service: Cardiovascular;;m-d LAD overlapping 3.0 x 16 & 2.75 x 16 Promus P DES; OM1-dCx Minicrush bifurcation (dCx 2.25 x 38 Synergy DES crushed with 2.75 x 24 Synergy DES in OM1)   . Cardiac catheterization N/A 12/02/2014    Procedure: Left Heart Cath and Coronary Angiography;  Surgeon: Leonie Man, MD;  Location: [facility];  Service: Cardiovascular;  Previous Stents in LAD & Cx-OM patent, mLAD prox to stents 99%  . Cardiac catheterization N/A 12/02/2014    Procedure: Coronary Stent Intervention;  Surgeon: Leonie Man, MD;   Location: [facility];  Service: Cardiovascular;  3rd overlapping (prox) mLAD Promus Premier DES 3.0 x 20 (3.25 mm)  . Transthoracic echocardiogram  5/19 & 6/ 2016    a) EF 65-70%, mild LVH, Gr 1 DD; b) EF 60% -ron RWMA, no valve lesions   Family History  Problem Relation Age of Onset  . Hyperlipidemia Mother   . Sudden death Father   . Emphysema Maternal Grandmother     smoked   Social History  Substance Use Topics  . Smoking status: Former Smoker -- 1.50 packs/day for 39 years    Types: Cigarettes  . Smokeless tobacco: Never Used  . Alcohol Use: No     Comment: 11/15/2014 "might have a few drinks/yr"   OB History    No data available     Review of Systems  Constitutional: Positive for diaphoresis and fatigue. Negative for fever.  HENT: Negative for rhinorrhea.   Eyes: Positive for visual disturbance.  Respiratory: Positive for cough and shortness of breath.   Cardiovascular: Positive for chest pain.  Gastrointestinal: Positive for nausea. Negative for vomiting and abdominal pain.  Genitourinary: Negative for decreased urine volume.  Skin: Negative for rash.  Allergic/Immunologic: Negative for immunocompromised state.  Neurological: Positive for headaches. Negative for syncope.  Psychiatric/Behavioral: Negative for confusion.      Allergies  Corticosteroids and Penicillins  Home Medications   Prior to Admission medications   Medication Sig Start Date End Date Taking? Authorizing Provider  aspirin EC 81 MG EC tablet Take 1 tablet (81 mg total) by mouth daily. 11/18/14  Yes Debbe Odea, MD  atorvastatin (LIPITOR) 80 MG tablet Take 1 tablet (80 mg total) by mouth daily at 6 PM. 12/14/14  Yes Arnoldo Morale, MD  carvedilol (COREG) 3.125 MG tablet Take 1 tablet (3.125 mg total) by mouth 2 (two) times daily with a meal. 03/20/15  Yes Josalyn Funches, MD  clopidogrel (PLAVIX) 75 MG tablet Take 1 tablet (75 mg total) by mouth daily. 01/05/15  Yes Josalyn Funches, MD   furosemide (LASIX) 20 MG tablet Take 10 mg by mouth every other day.   Yes Historical Provider, MD  ibuprofen (ADVIL,MOTRIN) 200 MG tablet Take 800 mg by mouth every 8 (eight) hours as needed for mild pain or moderate pain (Back pain).    Yes Historical Provider, MD  Insulin Detemir (LEVEMIR FLEXPEN) 100 UNIT/ML Pen Inject 35 Units into the skin at bedtime. 03/30/15  Yes Josalyn Funches, MD  isosorbide mononitrate (IMDUR) 30 MG 24 hr tablet Take 1 tablet (30 mg total) by mouth daily. 12/22/14  Yes Leonie Man, MD  levothyroxine (SYNTHROID, LEVOTHROID) 175 MCG tablet Take 1 tablet (175 mcg total) by mouth daily before breakfast. 01/25/15  Yes Olugbemiga  Essie Christine, MD  meclizine (ANTIVERT) 25 MG tablet Take 1 tablet (25 mg total) by mouth 2 (two) times daily as needed for dizziness. 12/22/14  Yes Leonie Man, MD  metFORMIN (GLUCOPHAGE) 500 MG tablet Take 1 tablet (500 mg total) by mouth 2 (two) times daily with a meal. 03/20/15  Yes Josalyn Funches, MD  nitroGLYCERIN (NITROSTAT) 0.4 MG SL tablet Place 0.4 mg under the tongue every 5 (five) minutes as needed for chest pain (may take three doses by mouth per day as needed).   Yes Historical Provider, MD  olmesartan (BENICAR) 20 MG tablet Take 1 tablet (20 mg total) by mouth daily. 03/24/15  Yes Tanda Rockers, MD  oxyCODONE-acetaminophen (PERCOCET/ROXICET) 5-325 MG tablet Take 3 tablets by mouth daily. Take every day per patient   Yes Historical Provider, MD  Elastic Bandages & Supports (MEDICAL COMPRESSION STOCKINGS) MISC Wear daily 30 mm Hg pressure 03/20/15   Josalyn Funches, MD  furosemide (LASIX) 20 MG tablet TAKE 1/2 TABLET EVERY OTHER DAY OR AS NEEDED Patient not taking: Reported on 04/20/2015 03/23/15   Leonie Man, MD  glucose monitoring kit (FREESTYLE) monitoring kit 1 each by Does not apply route 4 (four) times daily - after meals and at bedtime. 1 month Diabetic Testing Supplies for QAC-QHS accuchecks. 11/16/14   Debbe Odea, MD  Insulin  Syringe-Needle U-100 (TRUEPLUS INSULIN SYRINGE) 30G X 5/16" 0.5 ML MISC 1 each by Does not apply route 4 (four) times daily. 03/02/15   Josalyn Funches, MD  oxyCODONE-acetaminophen (ROXICET) 5-325 MG per tablet Take 1-2 tablets by mouth every 4 (four) hours as needed for severe pain. Patient not taking: Reported on 04/20/2015 03/24/15   Tanda Rockers, MD   BP 100/52 mmHg  Pulse 74  Temp(Src) 98.7 F (37.1 C) (Oral)  Resp 13  Wt 189 lb (85.73 kg)  SpO2 100% Physical Exam  Constitutional: She is oriented to person, place, and time. She appears well-developed and well-nourished. No distress.  HENT:  Head: Normocephalic and atraumatic.  Eyes: Right eye exhibits no discharge. Left eye exhibits no discharge.  Neck: No tracheal deviation present.  Cardiovascular: Regular rhythm.  Tachycardia present.   Pulmonary/Chest: Effort normal. No respiratory distress. She has wheezes. She exhibits tenderness.  Abdominal: Soft. She exhibits no distension. There is no tenderness.  Musculoskeletal: She exhibits no edema.  Neurological: She is alert and oriented to person, place, and time. She has normal strength and normal reflexes. No cranial nerve deficit or sensory deficit. Coordination and gait normal. GCS eye subscore is 4. GCS verbal subscore is 5. GCS motor subscore is 6.  Skin: Skin is warm and dry.  Psychiatric: She has a normal mood and affect. Her behavior is normal.  Nursing note and vitals reviewed.   ED Course  Procedures (including critical care time)     EMERGENCY DEPARTMENT [place] CARDIAC EXAM "Study: Limited Ultrasound of the heart and pericardium"  INDICATIONS:Hypotension, Tachycardia and Dyspnea Multiple views of the heart and pericardium are obtained with a multi-frequency probe.  PERFORMED JS:EGBTDV  IMAGES ARCHIVED?: Yes  FINDINGS: No pericardial effusion, Normal contractility and Tamponade physiology absent  LIMITATIONS:  Pain   VIEWS USED: Parasternal long axis,  Parasternal short axis and Apical 4 chamber   INTERPRETATION: Cardiac activity present, Pericardial effusioin absent, Cardiac tamponade absent and Normal contractility  COMMENT:  No significant evidence of R heart strain (no straightening of septum/D shape or dilation)   Labs Review Labs Reviewed  BASIC METABOLIC PANEL - Abnormal;  Notable for the following:    Sodium 134 (*)    Chloride 98 (*)    Glucose, Bld 310 (*)    All other components within normal limits  CBC - Abnormal; Notable for the following:    WBC 11.8 (*)    RBC 5.20 (*)    All other components within normal limits  URINALYSIS, ROUTINE W REFLEX MICROSCOPIC (NOT AT [facility]) - Abnormal; Notable for the following:    APPearance CLOUDY (*)    Hgb urine dipstick SMALL (*)    All other components within normal limits  URINE MICROSCOPIC-ADD ON - Abnormal; Notable for the following:    Bacteria, UA FEW (*)    Casts HYALINE CASTS (*)    All other components within normal limits  BRAIN NATRIURETIC PEPTIDE  I-STAT TROPOININ, ED  I-STAT TROPOININ, ED    Imaging Review Dg Chest 2 View  04/20/2015  CLINICAL DATA:  Hot flashes for several weeks, began having LEFT side neck pain last night then developing central chest and LEFT arm pain this morning, shortness of breath, cardiac history, diabetes mellitus, coronary artery disease post MI, former smoker, fibromyalgia EXAM: CHEST  2 VIEW COMPARISON:  12/01/2014 FINDINGS: Normal heart size, mediastinal contours, and pulmonary vascularity. Calcified granuloma RIGHT upper lobe. Lungs otherwise clear. No pneumothorax. Bones unremarkable. IMPRESSION: No acute abnormalities. Electronically Signed   By: Lavonia Dana M.D.   On: 04/20/2015 15:42   Ct Angio Neck W/cm &/or Wo/cm  04/20/2015  CLINICAL DATA:  Chest pain with left neck pain EXAM: CT ANGIOGRAPHY NECK TECHNIQUE: Multidetector CT imaging of the neck was performed using the standard protocol during bolus administration of intravenous  contrast. Multiplanar CT image reconstructions and MIPs were obtained to evaluate the vascular anatomy. Carotid stenosis measurements (when applicable) are obtained utilizing NASCET criteria, using the distal internal carotid diameter as the denominator. CONTRAST:  46mL OMNIPAQUE IOHEXOL 350 MG/ML SOLN COMPARISON:  None. FINDINGS: Aortic arch: Mild atherosclerotic disease in the aortic arch and proximal great vessels. Negative for dissection or aneurysm. Lung apices clear. Right carotid system: Mild atherosclerotic disease at the right carotid bifurcation without significant stenosis. Negative for dissection Left carotid system: Mild atherosclerotic disease of the carotid bifurcation without significant stenosis. Negative for dissection Vertebral arteries:Both vertebral arteries are widely patent without atherosclerotic disease or dissection. Skeleton: No acute skeletal abnormality. No significant degenerative change. Other neck: Negative for adenopathy or mass. IMPRESSION: Negative CTA of the neck. Electronically Signed   By: Franchot Gallo M.D.   On: 04/20/2015 20:54   Ct Angio Chest Aorta W/cm &/or Wo/cm  04/20/2015  CLINICAL DATA:  Mid chest pain radiating to the left EXAM: CT ANGIOGRAPHY CHEST WITH CONTRAST TECHNIQUE: Multidetector CT imaging of the chest was performed using the standard protocol during bolus administration of intravenous contrast. Multiplanar CT image reconstructions and MIPs were obtained to evaluate the vascular anatomy. CONTRAST:  80 mL OMNIPAQUE 350 MG/ML SOLN COMPARISON:  02/10/2015 FINDINGS: The lungs are well aerated bilaterally. No focal infiltrate or sizable effusion is noted. There are multiple nodules identified bilaterally. Some of these are calcified and consistent with calcified granulomas. Some are better visualized on the previous exam than on the current exam and may be postinflammatory in nature. A small ground-glass area is noted in the left lower lobe measuring  approximately 7 mm best seen on image number 51 of series 7. The thoracic inlet is within normal limits. The thoracic aorta demonstrates some mild atherosclerotic calcifications without evidence of aneurysmal dilatation  or focal dissection. No intimal irregularities are identified. Heavy coronary calcifications are noted. The pulmonary artery is within normal limits although opacification is somewhat limited. No findings of right heart strain are noted. The upper abdomen is within normal limits as visualized. The bony structures show no acute abnormality. Review of the MIP images confirms the above findings. IMPRESSION: No evidence of thoracic aortic aneurysm or dissection. Multiple calcified and noncalcified nodules. Some of these are less well visualized on the current exam than on the prior study and may have been postinflammatory in nature. Again these likely represent granulomas although followup in 9 months to complete a year follow-up is recommended. A nodule seen previously in the left lower lobe now demonstrates a slightly more ground-glass appearance and measures approximately 7 mm which is an increase in size. Again this may be postinflammatory in nature although followup examination is again recommended as described above. Electronically Signed   By: Inez Catalina M.D.   On: 04/20/2015 20:52   I have personally reviewed and evaluated these images and lab results as part of my medical decision-making.   EKG Interpretation   Date/Time:  Thursday April 20 2015 15:11:36 EDT Ventricular Rate:  104 PR Interval:  140 QRS Duration: 74 QT Interval:  346 QTC Calculation: 454 R Axis:   47 Text Interpretation:  Sinus tachycardia Otherwise normal ECG Since  previous ECG, heart rate has increased Confirmed by [facility] MD, ERIN  ([id number]) on 04/20/2015 4:43:43 PM      MDM   Final diagnoses:  Chest pain  Hypotension, unspecified hypotension type  Acute nonintractable headache, unspecified  headache type    54 yo F with hx CAD s/p several DES and peri-PCI MI in May this year, COPD, HTN, HLD, hypothyroid, tobacco abuse, chronic pain, migraines, fibromyalgia presenting with chest pain as above. Intermittently hypotensive and tachycardic ranging from 100s to 130 when standing, not significantly improved by IVF. Also complaining of headache/migraine and neck pain- neuro intact, CTA of neck neg for dissection, improved with migraine cocktail.   CTA neg for aortic dissection or large PE and BSUS without evidence of large R heart strain. No new ischemic changes on EKG and trops neg x2 but pt with significant cardiac history this year. No evidence of PNA though pt with new inflammatory opacities on CT. Given ongoing unstable vitals and high risk, admitted for further management and likely cardiology consultation in AM.   Case discussed with Dr. Billy Fischer, who oversaw management of this patient.    Ivin Booty, MD 04/24/15 1556

## 2015-04-20 NOTE — Telephone Encounter (Signed)
Pt having interinvolvement of neck pain and chest pain along w/ eye blurriness and headache both unilateral to left side.  Headache started yesterday. She became symptomatic for the neck and chest pain today. She does not have BPs to report - no recent home checks - but does state it "runs high".  She denied use of non-prescribed medications. She does state concern about her carotid arteries.  Advised ER evaluation d/t her concerns that she will not be adequately addressed by primary care or UC, and d/t concern for chest pain. She did not specifically state chest pain until asked, but describes as sudden, catching, and she feels very nervous w/ this.  Encouraged to f/u w/ [place] once outcome of her concern is determined. Pt voiced understanding.

## 2015-04-20 NOTE — Telephone Encounter (Signed)
Pt has a pain in her neck,head is hurting on left side and eye is blurry. Please call asap to advise.

## 2015-04-20 NOTE — ED Notes (Signed)
Pt reports for several weeks has been having hot flashes, last night started having left sided neck pain. This morning developed central cp, also pain in left arm. Cardiac hx. Feels sob, denies n/v/d.

## 2015-04-21 ENCOUNTER — Observation Stay (HOSPITAL_BASED_OUTPATIENT_CLINIC_OR_DEPARTMENT_OTHER): Payer: Medicare Other

## 2015-04-21 ENCOUNTER — Encounter (HOSPITAL_COMMUNITY): Payer: Self-pay | Admitting: Internal Medicine

## 2015-04-21 ENCOUNTER — Observation Stay (HOSPITAL_COMMUNITY): Payer: Medicare Other

## 2015-04-21 DIAGNOSIS — R911 Solitary pulmonary nodule: Secondary | ICD-10-CM

## 2015-04-21 DIAGNOSIS — E782 Mixed hyperlipidemia: Secondary | ICD-10-CM

## 2015-04-21 DIAGNOSIS — G894 Chronic pain syndrome: Secondary | ICD-10-CM

## 2015-04-21 DIAGNOSIS — I10 Essential (primary) hypertension: Secondary | ICD-10-CM | POA: Diagnosis not present

## 2015-04-21 DIAGNOSIS — E1169 Type 2 diabetes mellitus with other specified complication: Secondary | ICD-10-CM | POA: Insufficient documentation

## 2015-04-21 DIAGNOSIS — E669 Obesity, unspecified: Secondary | ICD-10-CM

## 2015-04-21 DIAGNOSIS — R079 Chest pain, unspecified: Secondary | ICD-10-CM

## 2015-04-21 DIAGNOSIS — Z9861 Coronary angioplasty status: Secondary | ICD-10-CM

## 2015-04-21 DIAGNOSIS — E1165 Type 2 diabetes mellitus with hyperglycemia: Secondary | ICD-10-CM | POA: Diagnosis not present

## 2015-04-21 DIAGNOSIS — E1151 Type 2 diabetes mellitus with diabetic peripheral angiopathy without gangrene: Secondary | ICD-10-CM | POA: Diagnosis not present

## 2015-04-21 DIAGNOSIS — I25110 Atherosclerotic heart disease of native coronary artery with unstable angina pectoris: Secondary | ICD-10-CM | POA: Diagnosis not present

## 2015-04-21 DIAGNOSIS — R519 Headache, unspecified: Secondary | ICD-10-CM | POA: Insufficient documentation

## 2015-04-21 DIAGNOSIS — I2510 Atherosclerotic heart disease of native coronary artery without angina pectoris: Secondary | ICD-10-CM | POA: Diagnosis not present

## 2015-04-21 DIAGNOSIS — E785 Hyperlipidemia, unspecified: Secondary | ICD-10-CM | POA: Insufficient documentation

## 2015-04-21 DIAGNOSIS — I2582 Chronic total occlusion of coronary artery: Secondary | ICD-10-CM | POA: Diagnosis not present

## 2015-04-21 DIAGNOSIS — R51 Headache: Secondary | ICD-10-CM

## 2015-04-21 LAB — GLUCOSE, CAPILLARY
GLUCOSE-CAPILLARY: 174 mg/dL — AB (ref 65–99)
GLUCOSE-CAPILLARY: 219 mg/dL — AB (ref 65–99)
Glucose-Capillary: 172 mg/dL — ABNORMAL HIGH (ref 65–99)
Glucose-Capillary: 215 mg/dL — ABNORMAL HIGH (ref 65–99)
Glucose-Capillary: 227 mg/dL — ABNORMAL HIGH (ref 65–99)

## 2015-04-21 LAB — CBC
HCT: 39.6 % (ref 36.0–46.0)
HEMOGLOBIN: 13.3 g/dL (ref 12.0–15.0)
MCH: 28.5 pg (ref 26.0–34.0)
MCHC: 33.6 g/dL (ref 30.0–36.0)
MCV: 85 fL (ref 78.0–100.0)
PLATELETS: 231 10*3/uL (ref 150–400)
RBC: 4.66 MIL/uL (ref 3.87–5.11)
RDW: 14.1 % (ref 11.5–15.5)
WBC: 8.6 10*3/uL (ref 4.0–10.5)

## 2015-04-21 LAB — LIPID PANEL
CHOL/HDL RATIO: 5.4 ratio
CHOLESTEROL: 135 mg/dL (ref 0–200)
HDL: 25 mg/dL — ABNORMAL LOW (ref >40)
LDL Cholesterol: 41 mg/dL (ref 0–99)
Triglycerides: 346 mg/dL — ABNORMAL HIGH (ref <150)
VLDL: 69 mg/dL — AB (ref 0–40)

## 2015-04-21 LAB — MRSA PCR SCREENING: MRSA by PCR: NEGATIVE

## 2015-04-21 LAB — NM MYOCAR MULTI W/SPECT W/WALL MOTION / EF
CHL CUP NUCLEAR SDS: 3
CHL CUP NUCLEAR SSS: 7
CSEPHR: 65 %
CSEPPHR: 109 {beats}/min
LV dias vol: 68 mL
LVSYSVOL: 18 mL
MPHR: 166 {beats}/min
RATE: 0.18
Rest HR: 59 {beats}/min
SRS: 4
TID: 1.52

## 2015-04-21 LAB — TROPONIN I: Troponin I: <0.03 ng/mL (ref <0.031)

## 2015-04-21 LAB — HEPARIN LEVEL (UNFRACTIONATED): HEPARIN UNFRACTIONATED: 0.28 [IU]/mL — AB (ref 0.30–0.70)

## 2015-04-21 LAB — SEDIMENTATION RATE: SED RATE: 5 mm/h (ref 0–22)

## 2015-04-21 MED ORDER — REGADENOSON 0.4 MG/5ML IV SOLN
0.4000 mg | Freq: Once | INTRAVENOUS | Status: AC
  Administered 2015-04-21: 0.4 mg via INTRAVENOUS
  Filled 2015-04-21: qty 5

## 2015-04-21 MED ORDER — MECLIZINE HCL 25 MG PO TABS: 25.0000 mg | ORAL_TABLET | Freq: Two times a day (BID) | ORAL | Status: DC | PRN

## 2015-04-21 MED ORDER — TECHNETIUM TC 99M SESTAMIBI GENERIC - CARDIOLITE
30.0000 | Freq: Once | INTRAVENOUS | Status: AC | PRN
  Administered 2015-04-21: 30 via INTRAVENOUS

## 2015-04-21 MED ORDER — CARVEDILOL 3.125 MG PO TABS
3.1250 mg | ORAL_TABLET | Freq: Two times a day (BID) | ORAL | Status: DC
  Administered 2015-04-21: 3.125 mg via ORAL
  Filled 2015-04-21: qty 1

## 2015-04-21 MED ORDER — ONDANSETRON HCL 4 MG/2ML IJ SOLN: 4.0000 mg | Freq: Four times a day (QID) | INTRAMUSCULAR | Status: DC | PRN

## 2015-04-21 MED ORDER — ACETAMINOPHEN 325 MG PO TABS: 650.0000 mg | ORAL_TABLET | ORAL | Status: DC | PRN

## 2015-04-21 MED ORDER — INSULIN DETEMIR 100 UNIT/ML ~~LOC~~ SOLN
35.0000 [IU] | Freq: Every day | SUBCUTANEOUS | Status: DC
  Filled 2015-04-21: qty 0.35

## 2015-04-21 MED ORDER — ISOSORBIDE MONONITRATE ER 30 MG PO TB24
30.0000 mg | ORAL_TABLET | Freq: Every day | ORAL | Status: DC
Start: 2015-04-21 — End: 2015-04-21
  Administered 2015-04-21: 30 mg via ORAL
  Filled 2015-04-21: qty 1

## 2015-04-21 MED ORDER — MORPHINE SULFATE (PF) 2 MG/ML IV SOLN
2.0000 mg | INTRAVENOUS | Status: DC | PRN
  Administered 2015-04-21: 2 mg via INTRAVENOUS
  Filled 2015-04-21: qty 1

## 2015-04-21 MED ORDER — CLOPIDOGREL BISULFATE 75 MG PO TABS
75.0000 mg | ORAL_TABLET | Freq: Every day | ORAL | Status: DC
  Administered 2015-04-21: 75 mg via ORAL
  Filled 2015-04-21: qty 1

## 2015-04-21 MED ORDER — INSULIN DETEMIR 100 UNIT/ML FLEXPEN: 35.0000 [IU] | PEN_INJECTOR | Freq: Every day | SUBCUTANEOUS | Status: DC

## 2015-04-21 MED ORDER — REGADENOSON 0.4 MG/5ML IV SOLN
0.4000 mg | Freq: Once | INTRAVENOUS | Status: DC
  Filled 2015-04-21: qty 5

## 2015-04-21 MED ORDER — ISOSORBIDE MONONITRATE ER 30 MG PO TB24
30.0000 mg | ORAL_TABLET | Freq: Once | ORAL | Status: AC
  Administered 2015-04-21: 30 mg via ORAL
  Filled 2015-04-21: qty 1

## 2015-04-21 MED ORDER — ASPIRIN EC 81 MG PO TBEC
81.0000 mg | DELAYED_RELEASE_TABLET | Freq: Every day | ORAL | Status: DC
  Administered 2015-04-21: 81 mg via ORAL
  Filled 2015-04-21: qty 1

## 2015-04-21 MED ORDER — TECHNETIUM TC 99M SESTAMIBI GENERIC - CARDIOLITE
10.0000 | Freq: Once | INTRAVENOUS | Status: AC | PRN
  Administered 2015-04-21: 10 via INTRAVENOUS

## 2015-04-21 MED ORDER — INSULIN DETEMIR 100 UNIT/ML ~~LOC~~ SOLN
35.0000 [IU] | Freq: Once | SUBCUTANEOUS | Status: DC
  Administered 2015-04-21: 35 [IU] via SUBCUTANEOUS
  Filled 2015-04-21: qty 0.35

## 2015-04-21 MED ORDER — ATORVASTATIN CALCIUM 80 MG PO TABS
80.0000 mg | ORAL_TABLET | Freq: Once | ORAL | Status: AC
  Administered 2015-04-21: 80 mg via ORAL
  Filled 2015-04-21: qty 1

## 2015-04-21 MED ORDER — INSULIN ASPART 100 UNIT/ML ~~LOC~~ SOLN
0.0000 [IU] | Freq: Three times a day (TID) | SUBCUTANEOUS | Status: DC
Start: 2015-04-21 — End: 2015-04-21

## 2015-04-21 MED ORDER — OXYCODONE-ACETAMINOPHEN 5-325 MG PO TABS: 1.0000 | ORAL_TABLET | ORAL | Status: DC | PRN

## 2015-04-21 MED ORDER — HEPARIN (PORCINE) IN NACL 100-0.45 UNIT/ML-% IJ SOLN
1000.0000 [IU]/h | INTRAMUSCULAR | Status: DC
  Administered 2015-04-21: 1000 [IU]/h via INTRAVENOUS
  Filled 2015-04-21: qty 250

## 2015-04-21 MED ORDER — ATORVASTATIN CALCIUM 80 MG PO TABS: 80.0000 mg | ORAL_TABLET | Freq: Every day | ORAL | Status: DC

## 2015-04-21 MED ORDER — NITROGLYCERIN 0.4 MG SL SUBL: 0.4000 mg | SUBLINGUAL_TABLET | SUBLINGUAL | Status: DC | PRN

## 2015-04-21 MED ORDER — CARVEDILOL 3.125 MG PO TABS
3.1250 mg | ORAL_TABLET | Freq: Once | ORAL | Status: AC
  Administered 2015-04-21: 3.125 mg via ORAL
  Filled 2015-04-21: qty 1

## 2015-04-21 MED ORDER — HEPARIN (PORCINE) IN NACL 100-0.45 UNIT/ML-% IJ SOLN: 1150.0000 [IU]/h | INTRAMUSCULAR | Status: DC

## 2015-04-21 MED ORDER — HEPARIN BOLUS VIA INFUSION
4000.0000 [IU] | Freq: Once | INTRAVENOUS | Status: AC
  Administered 2015-04-21: 4000 [IU] via INTRAVENOUS
  Filled 2015-04-21: qty 4000

## 2015-04-21 MED ORDER — REGADENOSON 0.4 MG/5ML IV SOLN
INTRAVENOUS | Status: AC
  Filled 2015-04-21: qty 5

## 2015-04-21 MED ORDER — LEVOTHYROXINE SODIUM 75 MCG PO TABS
175.0000 ug | ORAL_TABLET | Freq: Every day | ORAL | Status: DC
  Administered 2015-04-21: 175 ug via ORAL
  Filled 2015-04-21 (×2): qty 1

## 2015-04-21 MED ORDER — OXYCODONE-ACETAMINOPHEN 5-325 MG PO TABS
1.0000 | ORAL_TABLET | ORAL | Status: DC | PRN
  Administered 2015-04-21: 2 via ORAL
  Administered 2015-04-21 (×2): 1 via ORAL
  Filled 2015-04-21 (×2): qty 1
  Filled 2015-04-21: qty 2

## 2015-04-21 MED ORDER — OXYCODONE-ACETAMINOPHEN 5-325 MG PO TABS
1.0000 | ORAL_TABLET | Freq: Once | ORAL | Status: AC
  Administered 2015-04-21: 1 via ORAL
  Filled 2015-04-21: qty 1

## 2015-04-21 NOTE — Progress Notes (Signed)
ANTICOAGULATION CONSULT NOTE - Follow Up Consult  Pharmacy Consult:  Heparin Indication: chest pain/ACS  Allergies  Allergen Reactions  . Corticosteroids Anaphylaxis    ALL steroids.     . Penicillins Anaphylaxis    Has patient had a PCN reaction causing immediate rash, facial/tongue/throat swelling, SOB or lightheadedness with hypotension: {Yes Has patient had a PCN reaction causing severe rash involving mucus membranes or skin necrosis: NO Has patient had a PCN reaction that required hospitalization NO Has patient had a PCN reaction occurring within the last 10 years: {Yes If all of the above answers are "NO", then may proceed with Cephalosporin use.     Patient Measurements: Height: 5\' 2"  (157.5 cm) Weight: 192 lb 12.8 oz (87.454 kg) IBW/kg (Calculated) : 50.1 Heparin Dosing Weight: 75 kg  Vital Signs: Temp: 98.7 F (37.1 C) (10/21 0448) Temp Source: Oral (10/21 0448) BP: 110/61 mmHg (10/21 0742) Pulse Rate: 67 (10/21 0742)  Labs:  Recent Labs  04/20/15 1533 04/21/15 0435 04/21/15 0939  HGB 15.0  --  13.3  HCT 43.6  --  39.6  PLT 274  --  231  HEPARINUNFRC  --   --  0.28*  CREATININE 0.77  --   --   TROPONINI  --  <0.03 <0.03    Estimated Creatinine Clearance: 82.6 mL/min (by C-G formula based on Cr of 0.77).      Assessment: 33 YOF with an extensive cardiac history to continue on IV heparin for ACS.  Heparin level slightly sub-therapeutic; no bleeding reported.   Goal of Therapy:  Heparin level 0.3-0.7 units/ml Monitor platelets by anticoagulation protocol: Yes    Plan:  - Increase heparin gtt to 1150 units/hr - Check 6 hr HL - Daily HL / CBC - F/U post stress test   Aricka Goldberger D. Mina Marble, PharmD, BCPS Pager:  [phone] 04/21/2015, 12:38 PM

## 2015-04-21 NOTE — Progress Notes (Signed)
ANTICOAGULATION CONSULT NOTE - Initial Consult  Pharmacy Consult for heparin Indication: chest pain/ACS  Allergies  Allergen Reactions  . Corticosteroids Anaphylaxis    ALL steroids.     . Penicillins Anaphylaxis    Has patient had a PCN reaction causing immediate rash, facial/tongue/throat swelling, SOB or lightheadedness with hypotension: {Yes Has patient had a PCN reaction causing severe rash involving mucus membranes or skin necrosis: NO Has patient had a PCN reaction that required hospitalization NO Has patient had a PCN reaction occurring within the last 10 years: {Yes If all of the above answers are "NO", then may proceed with Cephalosporin use.     Patient Measurements: Height: _0  (157.5 cm) Weight: 192 lb 12.8 oz (87.454 kg) IBW/kg (Calculated) : 50.1 Heparin Dosing Weight: 75kg  Vital Signs: Temp: 98.4 F (36.9 C) (10/21 0035) Temp Source: Oral (10/21 0035) BP: 118/56 mmHg (10/21 0313) Pulse Rate: 71 (10/21 0313)  Labs:  Recent Labs  04/20/15 1533  HGB 15.0  HCT 43.6  PLT 274  CREATININE 0.77    Estimated Creatinine Clearance: 82.6 mL/min (by C-G formula based on Cr of 0.77).   Medical History: Past Medical History  Diagnosis Date  . Fibromyalgia  Dx 2004  . Tobacco abuse     At 54 years old  . Hypothyroid Dx 2004  . Family history of adverse reaction to anesthesia     "mother died twice during surgery; they brought her back both times" (11/15/2014)  . Sleep apnea     "dx'd; don't wear a mask" (11/15/2014)  . History of stomach ulcers 1981  . Migraine     "couple/month" (11/15/2014)  . Headache     "@ least weekly" (11/15/2014)  . Arthritis     "hips, ankles, knees, hands, back" (11/15/2014)  . Chronic back pain     "all over"  . Anxiety     "social"  . Chronic depression     At age of 54years old  . Kidney stones     "I've had several"  . Poorly controlled type 2 diabetes mellitus with circulatory disorder ([place]) 11/15/2014  . Essential  hypertension 11/15/2014  . Hyperlipidemia associated with type 2 diabetes mellitus ([place]) 11/15/2014  . NSTEMI (non-ST elevated myocardial infarction) ([place]) 11/15/2014  . CAD S/P percutaneous coronary angioplasty 5/18/'16 ; 6/3/'16    a) NSTEMI: m-d LAD 90% -->0% Post PCI - 2 overlap DES ( Promus Premier DES 3.0 x 16 - 2.75  x 16), m-dCx 95%--> 0% post DES PCI (Synergy DES 2.25 x 38 -mini-crushed @ Ostial OM1 , Ostial-prox OM1 90% -> 0% post DES PCI (Synergy DES 2.75 x 24 -> 3.0 mm) - bifurcation minicrush of mCx stent;; b) UA/ACS: mLAD upstream - 3rd overlapping Promus DES 3.0 x 20 (3.25 mm)   . Coronary artery chronic total occlusion: 100% RCA CTO 11/16/2014    Prox RCA to Mid RCA lesion, 100% stenosed.   . Type IVa MI - Peri-PCI 11/17/2014    Prolonged ischemia during bifurcation PCI of  m-d Cx-OM1 with minicrush technique    Medications:  Prescriptions prior to admission  Medication Sig Dispense Refill Last Dose  . aspirin EC 81 MG EC tablet Take 1 tablet (81 mg total) by mouth daily. 30 tablet 0 04/19/2015 at Unknown time  . atorvastatin (LIPITOR) 80 MG tablet Take 1 tablet (80 mg total) by mouth daily at 6 PM. 30 tablet 2 04/19/2015 at Unknown time  . carvedilol (COREG) 3.125 MG tablet Take 1  tablet (3.125 mg total) by mouth 2 (two) times daily with a meal. 60 tablet 2 04/20/2015 at 1100  . clopidogrel (PLAVIX) 75 MG tablet Take 1 tablet (75 mg total) by mouth daily. 30 tablet 2 04/20/2015 at Unknown time  . furosemide (LASIX) 20 MG tablet Take 10 mg by mouth every other day.   Past Month at Unknown time  . ibuprofen (ADVIL,MOTRIN) 200 MG tablet Take 800 mg by mouth every 8 (eight) hours as needed for mild pain or moderate pain (Back pain).    unknown at Unknown time  . Insulin Detemir (LEVEMIR FLEXPEN) 100 UNIT/ML Pen Inject 35 Units into the skin at bedtime. 40 mL 3 04/19/2015 at Unknown time  . isosorbide mononitrate (IMDUR) 30 MG 24 hr tablet Take 1 tablet (30 mg total) by mouth daily. 30  tablet 6 04/20/2015 at Unknown time  . levothyroxine (SYNTHROID, LEVOTHROID) 175 MCG tablet Take 1 tablet (175 mcg total) by mouth daily before breakfast. 90 tablet 3 04/20/2015 at Unknown time  . meclizine (ANTIVERT) 25 MG tablet Take 1 tablet (25 mg total) by mouth 2 (two) times daily as needed for dizziness. 60 tablet 3 04/20/2015 at Unknown time  . metFORMIN (GLUCOPHAGE) 500 MG tablet Take 1 tablet (500 mg total) by mouth 2 (two) times daily with a meal. 60 tablet 5 04/20/2015 at Unknown time  . nitroGLYCERIN (NITROSTAT) 0.4 MG SL tablet Place 0.4 mg under the tongue every 5 (five) minutes as needed for chest pain (may take three doses by mouth per day as needed).   unknown at unknown  . olmesartan (BENICAR) 20 MG tablet Take 1 tablet (20 mg total) by mouth daily.   04/20/2015 at Unknown time  . oxyCODONE-acetaminophen (PERCOCET/ROXICET) 5-325 MG tablet Take 3 tablets by mouth daily. Take every day per patient   04/20/2015 at Unknown time  . Elastic Bandages & Supports (MEDICAL COMPRESSION STOCKINGS) MISC Wear daily 30 mm Hg pressure 2 each 0 Taking  . furosemide (LASIX) 20 MG tablet TAKE 1/2 TABLET EVERY OTHER DAY OR AS NEEDED (Patient not taking: Reported on 04/20/2015) 30 tablet 11 Not Taking at Unknown time  . glucose monitoring kit (FREESTYLE) monitoring kit 1 each by Does not apply route 4 (four) times daily - after meals and at bedtime. 1 month Diabetic Testing Supplies for QAC-QHS accuchecks. 1 each 1 Taking  . Insulin Syringe-Needle U-100 (TRUEPLUS INSULIN SYRINGE) 30G X 5/16" 0.5 ML MISC 1 each by Does not apply route 4 (four) times daily. 100 each 11 Taking  . oxyCODONE-acetaminophen (ROXICET) 5-325 MG per tablet Take 1-2 tablets by mouth every 4 (four) hours as needed for severe pain. (Patient not taking: Reported on 04/20/2015) 40 tablet 0 Not Taking at Unknown time   Scheduled:  . aspirin EC  81 mg Oral Daily  . atorvastatin  80 mg Oral q1800  . carvedilol  3.125 mg Oral BID WC  .  clopidogrel  75 mg Oral Daily  . insulin aspart  0-9 Units Subcutaneous TID WC  . insulin detemir  35 Units Subcutaneous QHS  . isosorbide mononitrate  30 mg Oral Daily  . levothyroxine  175 mcg Oral QAC breakfast    Assessment: 54yo female c/o neck and head pain, when asked by outpt RN pt admitted to CP, initial troponin negative, to begin heparin.  Goal of Therapy:  Heparin level 0.3-0.7 units/ml Monitor platelets by anticoagulation protocol: Yes   Plan:  Will give heparin 4000 units IV bolus x1 followed by gtt  at 1000 units/hr and monitor heparin levels and CBC.  Wynona Neat, PharmD, BCPS  04/21/2015,3:44 AM

## 2015-04-21 NOTE — Progress Notes (Signed)
     The patient was seen in nuclear medicine for a lexiscan myoview. She tolerated the procedure well. No acute ST or TW changes on ECG. Await nuclear images.    Kylyn Sookram Stern PA-C  [facility]     

## 2015-04-21 NOTE — Progress Notes (Signed)
Patient seen and examined this morning, presented to the ED with HA in the setting of migraine history however also with chest pain / pressure radiating to her neck on the left. She has known CAD with several stents placed earlier this year. For full details refer to H&P by Dr. Glyn Ade  - she is feeling better this morning, CP improved - cardiac enzymes negative - cardiology consulted, appreciate input.   Costin M. Cruzita Lederer, MD [facility] [phone]

## 2015-04-21 NOTE — Discharge Instructions (Signed)
Follow with [name], MD in 1-2 weeks Please have lipid panel rechecked  Please get a complete blood count and chemistry panel checked by your Primary MD at your next visit, and again as instructed by your Primary MD. Please get your medications reviewed and adjusted by your Primary MD.  Please request your Primary MD to go over all Hospital Tests and Procedure/Radiological results at the follow up, please get all Hospital records sent to your Prim MD by signing hospital release before you go home.  If you had Pneumonia of Lung problems at the Hospital: Please get a 2 view Chest X ray done in 6-8 weeks after hospital discharge or sooner if instructed by your Primary MD.  If you have Congestive Heart Failure: Please call your Cardiologist or Primary MD anytime you have any of the following symptoms:  1) 3 pound weight gain in 24 hours or 5 pounds in 1 week  2) shortness of breath, with or without a dry hacking cough  3) swelling in the hands, feet or stomach  4) if you have to sleep on extra pillows at night in order to breathe  Follow cardiac low salt diet and 1.5 lit/day fluid restriction.  If you have diabetes Accuchecks 4 times/day, Once in AM empty stomach and then before each meal. Log in all results and show them to your primary doctor at your next visit. If any glucose reading is under 80 or above 300 call your primary MD immediately.  If you have Seizure/Convulsions/Epilepsy: Please do not drive, operate heavy machinery, participate in activities at heights or participate in high speed sports until you have seen by Primary MD or a Neurologist and advised to do so again.  If you had Gastrointestinal Bleeding: Please ask your Primary MD to check a complete blood count within one week of discharge or at your next visit. Your endoscopic/colonoscopic biopsies that are pending at the time of discharge, will also need to followed by your Primary MD.  Get Medicines reviewed  and adjusted. Please take all your medications with you for your next visit with your Primary MD  Please request your Primary MD to go over all hospital tests and procedure/radiological results at the follow up, please ask your Primary MD to get all Hospital records sent to his/her office.  If you experience worsening of your admission symptoms, develop shortness of breath, life threatening emergency, suicidal or homicidal thoughts you must seek medical attention immediately by calling 911 or calling your MD immediately  if symptoms less severe.  You must read complete instructions/literature along with all the possible adverse reactions/side effects for all the Medicines you take and that have been prescribed to you. Take any new Medicines after you have completely understood and accpet all the possible adverse reactions/side effects.   Do not drive or operate heavy machinery when taking Pain medications.   Do not take more than prescribed Pain, Sleep and Anxiety Medications  Special Instructions: If you have smoked or chewed Tobacco  in the last 2 yrs please stop smoking, stop any regular Alcohol  and or any Recreational drug use.  Wear Seat belts while driving.  Please note You were cared for by a hospitalist during your hospital stay. If you have any questions about your discharge medications or the care you received while you were in the hospital after you are discharged, you can call the unit and asked to speak with the hospitalist on call if the hospitalist that took care  of you is not available. Once you are discharged, your primary care physician will handle any further medical issues. Please note that NO REFILLS for any discharge medications will be authorized once you are discharged, as it is imperative that you return to your primary care physician (or establish a relationship with a primary care physician if you do not have one) for your aftercare needs so that they can reassess your  need for medications and monitor your lab values.  You can reach the hospitalist office at phone [phone] or fax [phone]   If you do not have a primary care physician, you can call [phone] for a physician referral.  Activity: As tolerated with Full fall precautions use walker/cane & assistance as needed  Diet: heart healthy  Disposition Home

## 2015-04-21 NOTE — Discharge Summary (Signed)
Physician Discharge Summary  Jacqueline Munoz MWU:[id number] DOB: Feb 27, 1961 DOA: 04/20/2015  PCP: [name], MD  Admit date: 04/20/2015 Discharge date: 04/21/2015  Time spent: > 35 minutes  Recommendations for Outpatient Follow-up:  1. Follow up with Dr. Adrian Blackwater in 1-2 weeks  2. Follow up with Dr. Ellyn Hack as scheduled  Discharge Diagnoses:  Principal Problem:   Chest pain Active Problems:   Type 2 diabetes, controlled, with peripheral circulatory disorder (HCC)   Essential hypertension   CAD S/P DES PCI to LAD (3) & OM1-dCx bifurcation (minicrush)   Chronic pain syndrome   Incidental lung nodule, > 32mm and < 94mm   Cephalalgia   Mixed hyperlipidemia   Obesity (BMI 30-39.9)  Discharge Condition: stable  Diet recommendation: heart healthy  Filed Weights   04/20/15 1514 04/21/15 0035  Weight: 85.73 kg (189 lb) 87.454 kg (192 lb 12.8 oz)   History of present illness:  Jacqueline Munoz is a 54 y.o. female history of CAD status post stenting in June 2016 presently here because of chest pain. Patient states since yesterday morning around 10 AM after waking up patient has been having chest pain which was retrosternal radiating to her neck and back. Patient also has been having some left-sided headache. Denies any focal deficits. Denies any shortness of breath. Patient called up her cardiology office and was instructed to come to the ER. Since patient had chest pain and neck pain ongoing patient had CT angiogram of the neck and also chest which were unremarkable. Patient chest pain improved with narcotics but still has mild persistent dull pain. Cardiac markers were negative. Patient has been admitted for chest pain concerning for unstable angina. Patient denies any nausea vomiting. Has chronic abdominal pain.   Hospital Course:  Chest pain concerning for unstable angina - Patient was admitted to the hospital with chest pain irradiating into her neck, and given extensive cardiac  history with prior stents she was started on a heparin infusion and cardiology was consulted. Initial CT was negative for PE or dissection. Her cardiac enzymes have remained negative, and patient underwent a stress test which showed normal perfusion, and normal LV regional and systolic function.  Diabetes mellitus type 2 with hyperglycemia - resume home medications Hypertension - resume home medications. Briefly hypotensive in the ED, now improved. Per cardiology she wound benefit up-titration of her Coreg to 6.25 BID, however given initial hypotension would maintain current dose on d/c and this is to be considered as an outpatient.  Lung nodule - was again seen in the CT chest. Patient will need close follow-up as outpatient. Chronic pain - continue home medications. Hypothyroidism on Synthroid. Headache - mostly left temporal side, strong history of migraine headaches in the past, improved with narcotics. Patient appears nonfocal. Sedimentation rate normal. Outpatient follow up.   Procedures:  Stress test    Consultations:  Cardiology   Discharge Exam: Filed Vitals:   04/21/15 0742 04/21/15 1241 04/21/15 1309 04/21/15 1311  BP: 110/61 111/68 143/58 155/63  Pulse: 67 63 107 99  Temp:      TempSrc:      Resp:      Height:      Weight:      SpO2:       General: NAD Cardiovascular: RRR Respiratory: CTA biL  Discharge Instructions     Medication List    TAKE these medications        aspirin 81 MG EC tablet  Take 1 tablet (81 mg total) by  mouth daily.     atorvastatin 80 MG tablet  Commonly known as:  LIPITOR  Take 1 tablet (80 mg total) by mouth daily at 6 PM.     carvedilol 3.125 MG tablet  Commonly known as:  COREG  Take 1 tablet (3.125 mg total) by mouth 2 (two) times daily with a meal.     clopidogrel 75 MG tablet  Commonly known as:  PLAVIX  Take 1 tablet (75 mg total) by mouth daily.     furosemide 20 MG tablet  Commonly known as:  LASIX  TAKE 1/2 TABLET  EVERY OTHER DAY OR AS NEEDED     glucose monitoring kit monitoring kit  1 each by Does not apply route 4 (four) times daily - after meals and at bedtime. 1 month Diabetic Testing Supplies for QAC-QHS accuchecks.     ibuprofen 200 MG tablet  Commonly known as:  ADVIL,MOTRIN  Take 800 mg by mouth every 8 (eight) hours as needed for mild pain or moderate pain (Back pain).     Insulin Detemir 100 UNIT/ML Pen  Commonly known as:  LEVEMIR FLEXPEN  Inject 35 Units into the skin at bedtime.     Insulin Syringe-Needle U-100 30G X 5/16" 0.5 ML Misc  Commonly known as:  TRUEPLUS INSULIN SYRINGE  1 each by Does not apply route 4 (four) times daily.     isosorbide mononitrate 30 MG 24 hr tablet  Commonly known as:  IMDUR  Take 1 tablet (30 mg total) by mouth daily.     levothyroxine 175 MCG tablet  Commonly known as:  SYNTHROID, LEVOTHROID  Take 1 tablet (175 mcg total) by mouth daily before breakfast.     meclizine 25 MG tablet  Commonly known as:  ANTIVERT  Take 1 tablet (25 mg total) by mouth 2 (two) times daily as needed for dizziness.     Medical Compression Stockings Misc  Wear daily 30 mm Hg pressure     metFORMIN 500 MG tablet  Commonly known as:  GLUCOPHAGE  Take 1 tablet (500 mg total) by mouth 2 (two) times daily with a meal.     nitroGLYCERIN 0.4 MG SL tablet  Commonly known as:  NITROSTAT  Place 0.4 mg under the tongue every 5 (five) minutes as needed for chest pain (may take three doses by mouth per day as needed).     olmesartan 20 MG tablet  Commonly known as:  BENICAR  Take 1 tablet (20 mg total) by mouth daily.     oxyCODONE-acetaminophen 5-325 MG tablet  Commonly known as:  PERCOCET/ROXICET  Take 1-2 tablets by mouth every 4 (four) hours as needed for severe pain.           Follow-up Information    Follow up with [name], MD. Schedule an appointment as soon as possible for a visit in 2 weeks.   Specialty:  Family Medicine   Contact information:     [address] [place] [address] [phone]       The results of significant diagnostics from this hospitalization (including imaging, microbiology, ancillary and laboratory) are listed below for reference.    Significant Diagnostic Studies: Dg Chest 2 View  04/20/2015  CLINICAL DATA:  Hot flashes for several weeks, began having LEFT side neck pain last night then developing central chest and LEFT arm pain this morning, shortness of breath, cardiac history, diabetes mellitus, coronary artery disease post MI, former smoker, fibromyalgia EXAM: CHEST  2 VIEW  COMPARISON:  12/01/2014 FINDINGS: Normal heart size, mediastinal contours, and pulmonary vascularity. Calcified granuloma RIGHT upper lobe. Lungs otherwise clear. No pneumothorax. Bones unremarkable. IMPRESSION: No acute abnormalities. Electronically Signed   By: Lavonia Dana M.D.   On: 04/20/2015 15:42   Ct Angio Neck W/cm &/or Wo/cm  04/20/2015  CLINICAL DATA:  Chest pain with left neck pain EXAM: CT ANGIOGRAPHY NECK TECHNIQUE: Multidetector CT imaging of the neck was performed using the standard protocol during bolus administration of intravenous contrast. Multiplanar CT image reconstructions and MIPs were obtained to evaluate the vascular anatomy. Carotid stenosis measurements (when applicable) are obtained utilizing NASCET criteria, using the distal internal carotid diameter as the denominator. CONTRAST:  54mL OMNIPAQUE IOHEXOL 350 MG/ML SOLN COMPARISON:  None. FINDINGS: Aortic arch: Mild atherosclerotic disease in the aortic arch and proximal great vessels. Negative for dissection or aneurysm. Lung apices clear. Right carotid system: Mild atherosclerotic disease at the right carotid bifurcation without significant stenosis. Negative for dissection Left carotid system: Mild atherosclerotic disease of the carotid bifurcation without significant stenosis. Negative for dissection Vertebral arteries:Both vertebral arteries are widely  patent without atherosclerotic disease or dissection. Skeleton: No acute skeletal abnormality. No significant degenerative change. Other neck: Negative for adenopathy or mass. IMPRESSION: Negative CTA of the neck. Electronically Signed   By: Franchot Gallo M.D.   On: 04/20/2015 20:54   Nm Myocar Multi W/spect W/wall Motion / Ef  04/21/2015   The study is normal.  Nuclear stress EF: 73%.  Low risk stress nuclear study with normal perfusion (mild breast attenuation artifact) and normal left ventricular regional and global systolic function.   Ct Angio Chest Aorta W/cm &/or Wo/cm  04/20/2015  CLINICAL DATA:  Mid chest pain radiating to the left EXAM: CT ANGIOGRAPHY CHEST WITH CONTRAST TECHNIQUE: Multidetector CT imaging of the chest was performed using the standard protocol during bolus administration of intravenous contrast. Multiplanar CT image reconstructions and MIPs were obtained to evaluate the vascular anatomy. CONTRAST:  80 mL OMNIPAQUE 350 MG/ML SOLN COMPARISON:  02/10/2015 FINDINGS: The lungs are well aerated bilaterally. No focal infiltrate or sizable effusion is noted. There are multiple nodules identified bilaterally. Some of these are calcified and consistent with calcified granulomas. Some are better visualized on the previous exam than on the current exam and may be postinflammatory in nature. A small ground-glass area is noted in the left lower lobe measuring approximately 7 mm best seen on image number 51 of series 7. The thoracic inlet is within normal limits. The thoracic aorta demonstrates some mild atherosclerotic calcifications without evidence of aneurysmal dilatation or focal dissection. No intimal irregularities are identified. Heavy coronary calcifications are noted. The pulmonary artery is within normal limits although opacification is somewhat limited. No findings of right heart strain are noted. The upper abdomen is within normal limits as visualized. The bony structures show no  acute abnormality. Review of the MIP images confirms the above findings. IMPRESSION: No evidence of thoracic aortic aneurysm or dissection. Multiple calcified and noncalcified nodules. Some of these are less well visualized on the current exam than on the prior study and may have been postinflammatory in nature. Again these likely represent granulomas although followup in 9 months to complete a year follow-up is recommended. A nodule seen previously in the left lower lobe now demonstrates a slightly more ground-glass appearance and measures approximately 7 mm which is an increase in size. Again this may be postinflammatory in nature although followup examination is again recommended as described above. Electronically Signed  By: Inez Catalina M.D.   On: 04/20/2015 20:52    Microbiology: Recent Results (from the past 240 hour(s))  MRSA PCR Screening     Status: None   Collection Time: 04/21/15 12:58 AM  Result Value Ref Range Status   MRSA by PCR NEGATIVE NEGATIVE Final    Comment:        The GeneXpert MRSA Assay (FDA approved for NASAL specimens only), is one component of a comprehensive MRSA colonization surveillance program. It is not intended to diagnose MRSA infection nor to guide or monitor treatment for MRSA infections.      Labs: Basic Metabolic Panel:  Recent Labs Lab 04/20/15 1533  NA 134*  K 3.9  CL 98*  CO2 22  GLUCOSE 310*  BUN 12  CREATININE 0.77  CALCIUM 9.9   CBC:  Recent Labs Lab 04/20/15 1533 04/21/15 0939  WBC 11.8* 8.6  HGB 15.0 13.3  HCT 43.6 39.6  MCV 83.8 85.0  PLT 274 231   Cardiac Enzymes:  Recent Labs Lab 04/21/15 0435 04/21/15 0939 04/21/15 1506  TROPONINI <0.03 <0.03 <0.03   BNP: BNP (last 3 results)  Recent Labs  12/01/14 2337 04/20/15 2056  BNP 127.8* 17.4   CBG:  Recent Labs Lab 04/21/15 0039 04/21/15 0732 04/21/15 1115 04/21/15 1441 04/21/15 1621  GLUCAP 227* 174* 172* 219* 215*       Signed:  Marzetta Board  [facility] 04/21/2015, 4:41 PM

## 2015-04-21 NOTE — Consult Note (Signed)
Patient ID: Jacqueline Munoz MRN: [id number], DOB/AGE: August 13, 1960   Admit date: 04/20/2015   Primary Physician: [name], MD Primary Cardiologist: Dr. Ellyn Hack  Pt. Profile:  54 y/o female, with h/o CAD, DM, HTN, andHLD presenting with chest pain/ neck pain.   Problem List  Past Medical History  Diagnosis Date  . Fibromyalgia  Dx 2004  . Tobacco abuse     At 54 years old  . Hypothyroid Dx 2004  . Family history of adverse reaction to anesthesia     "mother died twice during surgery; they brought her back both times" (11/15/2014)  . Sleep apnea     "dx'd; don't wear a mask" (11/15/2014)  . History of stomach ulcers 1981  . Migraine     "couple/month" (11/15/2014)  . Headache     "@ least weekly" (11/15/2014)  . Arthritis     "hips, ankles, knees, hands, back" (11/15/2014)  . Chronic back pain     "all over"  . Anxiety     "social"  . Chronic depression     At age of 54years old  . Kidney stones     "I've had several"  . Poorly controlled type 2 diabetes mellitus with circulatory disorder ([place]) 11/15/2014  . Essential hypertension 11/15/2014  . Hyperlipidemia associated with type 2 diabetes mellitus ([place]) 11/15/2014  . NSTEMI (non-ST elevated myocardial infarction) ([place]) 11/15/2014  . CAD S/P percutaneous coronary angioplasty 5/18/'16 ; 6/3/'16    a) NSTEMI: m-d LAD 90% -->0% Post PCI - 2 overlap DES ( Promus Premier DES 3.0 x 16 - 2.75  x 16), m-dCx 95%--> 0% post DES PCI (Synergy DES 2.25 x 38 -mini-crushed @ Ostial OM1 , Ostial-prox OM1 90% -> 0% post DES PCI (Synergy DES 2.75 x 24 -> 3.0 mm) - bifurcation minicrush of mCx stent;; b) UA/ACS: mLAD upstream - 3rd overlapping Promus DES 3.0 x 20 (3.25 mm)   . Coronary artery chronic total occlusion: 100% RCA CTO 11/16/2014    Prox RCA to Mid RCA lesion, 100% stenosed.   . Type IVa MI - Peri-PCI 11/17/2014    Prolonged ischemia during bifurcation PCI of  m-d Cx-OM1 with minicrush technique    Past Surgical History    Procedure Laterality Date  . Cesarean section  2000  . Inner ear surgery Bilateral "several"  . Tonsillectomy    . Total abdominal hysterectomy  ~ 2008  . Tubal ligation  2000  . Cardiac catheterization N/A 11/16/2014    Procedure: Left Heart Cath and Coronary Angiography;  Surgeon: Peter M Martinique, MD;  Location: [facility];  Service: Cardiovascular;  RCA 100% CTO, m-dLAD 90%, m-dCx 95%, OM1 90%  . Cardiac catheterization  11/16/2014    Procedure: Coronary Stent Intervention;  Surgeon: Peter M Martinique, MD;  Location: [facility];  Service: Cardiovascular;;m-d LAD overlapping 3.0 x 16 & 2.75 x 16 Promus P DES; OM1-dCx Minicrush bifurcation (dCx 2.25 x 38 Synergy DES crushed with 2.75 x 24 Synergy DES in OM1)   . Cardiac catheterization N/A 12/02/2014    Procedure: Left Heart Cath and Coronary Angiography;  Surgeon: Leonie Man, MD;  Location: [facility];  Service: Cardiovascular;  Previous Stents in LAD & Cx-OM patent, mLAD prox to stents 99%  . Cardiac catheterization N/A 12/02/2014    Procedure: Coronary Stent Intervention;  Surgeon: Leonie Man, MD;  Location: [facility];  Service: Cardiovascular;  3rd overlapping (prox) mLAD Promus Premier DES  3.0 x 20 (3.25 mm)  . Transthoracic echocardiogram  5/19 & 6/ 2016    a) EF 65-70%, mild LVH, Gr 1 DD; b) EF 60% -ron RWMA, no valve lesions     Allergies  Allergies  Allergen Reactions  . Corticosteroids Anaphylaxis    ALL steroids.     . Penicillins Anaphylaxis    Has patient had a PCN reaction causing immediate rash, facial/tongue/throat swelling, SOB or lightheadedness with hypotension: {Yes Has patient had a PCN reaction causing severe rash involving mucus membranes or skin necrosis: NO Has patient had a PCN reaction that required hospitalization NO Has patient had a PCN reaction occurring within the last 10 years: {Yes If all of the above answers are "NO", then may proceed with Cephalosporin use.      HPI  The patient is a 54 y/o female, followed by Dr. Ellyn Hack, admitted by IM for chest pain evaluation. She has a PMH significant for CAD.  In May 2016, she presented with a NSTEMI. She underwent bifurcation PCI to circumflex-OM. She then returned for staged PCI of the proximal LAD overlapping the mid LAD stent. She has an occluded RCA. She was originally placed on ASA + Brilinta, but changed from Brilinta to Plavix for financial reasons. Her EF was normal at that time at 60%. Her other medical history is significant for T2DM, HTN and HLD. She also admits to a h/o chronic pain and fibromyalgia. She is a former smoker but quit after her MI. She denies any further use since then.  She notes that over the last week she has not felt her usual self. She note generalized malaise. Yesterday morning, she woke with a severe headache and sharp left sided neck pain, radiating down her left arm. She later developed constant substernal chest tightness. It was somewhat similar to her MI pain but not as severe. She denies any associated dyspnea, nausea, vomiting, syncope/ near syncope. She also denies any exertional symptoms. Her CP is non pleuritic. It dose change some with exertion. She reports full medication compliance with all of her meds, including ASA and Plavix.   In the ED, CT of chest negative for dissection/ aneurysm. EKGs show mild sinus tach with a HR in the low 100s. BP is stable. CXR is unremarkable. Neck CT is negative. She had 2 negative POC troponins in the ED. Initial lab troponin is negative x 1.   She is currently CP free. She notes her pain resolved spontaneously w/o any intervention.    Home Medications  Prior to Admission medications   Medication Sig Start Date End Date Taking? Authorizing Provider  aspirin EC 81 MG EC tablet Take 1 tablet (81 mg total) by mouth daily. 11/18/14  Yes Debbe Odea, MD  atorvastatin (LIPITOR) 80 MG tablet Take 1 tablet (80 mg total) by mouth daily at 6  PM. 12/14/14  Yes Arnoldo Morale, MD  carvedilol (COREG) 3.125 MG tablet Take 1 tablet (3.125 mg total) by mouth 2 (two) times daily with a meal. 03/20/15  Yes Josalyn Funches, MD  clopidogrel (PLAVIX) 75 MG tablet Take 1 tablet (75 mg total) by mouth daily. 01/05/15  Yes Josalyn Funches, MD  furosemide (LASIX) 20 MG tablet Take 10 mg by mouth every other day.   Yes Historical Provider, MD  ibuprofen (ADVIL,MOTRIN) 200 MG tablet Take 800 mg by mouth every 8 (eight) hours as needed for mild pain or moderate pain (Back pain).    Yes Historical Provider, MD  Insulin Detemir (LEVEMIR FLEXPEN)  100 UNIT/ML Pen Inject 35 Units into the skin at bedtime. 03/30/15  Yes Josalyn Funches, MD  isosorbide mononitrate (IMDUR) 30 MG 24 hr tablet Take 1 tablet (30 mg total) by mouth daily. 12/22/14  Yes Leonie Man, MD  levothyroxine (SYNTHROID, LEVOTHROID) 175 MCG tablet Take 1 tablet (175 mcg total) by mouth daily before breakfast. 01/25/15  Yes Tresa Garter, MD  meclizine (ANTIVERT) 25 MG tablet Take 1 tablet (25 mg total) by mouth 2 (two) times daily as needed for dizziness. 12/22/14  Yes Leonie Man, MD  metFORMIN (GLUCOPHAGE) 500 MG tablet Take 1 tablet (500 mg total) by mouth 2 (two) times daily with a meal. 03/20/15  Yes Josalyn Funches, MD  nitroGLYCERIN (NITROSTAT) 0.4 MG SL tablet Place 0.4 mg under the tongue every 5 (five) minutes as needed for chest pain (may take three doses by mouth per day as needed).   Yes Historical Provider, MD  olmesartan (BENICAR) 20 MG tablet Take 1 tablet (20 mg total) by mouth daily. 03/24/15  Yes Tanda Rockers, MD  oxyCODONE-acetaminophen (PERCOCET/ROXICET) 5-325 MG tablet Take 3 tablets by mouth daily. Take every day per patient   Yes Historical Provider, MD  Elastic Bandages & Supports (MEDICAL COMPRESSION STOCKINGS) MISC Wear daily 30 mm Hg pressure 03/20/15   Josalyn Funches, MD  furosemide (LASIX) 20 MG tablet TAKE 1/2 TABLET EVERY OTHER DAY OR AS NEEDED Patient not  taking: Reported on 04/20/2015 03/23/15   Leonie Man, MD  glucose monitoring kit (FREESTYLE) monitoring kit 1 each by Does not apply route 4 (four) times daily - after meals and at bedtime. 1 month Diabetic Testing Supplies for QAC-QHS accuchecks. 11/16/14   Debbe Odea, MD  Insulin Syringe-Needle U-100 (TRUEPLUS INSULIN SYRINGE) 30G X 5/16" 0.5 ML MISC 1 each by Does not apply route 4 (four) times daily. 03/02/15   Josalyn Funches, MD  oxyCODONE-acetaminophen (ROXICET) 5-325 MG per tablet Take 1-2 tablets by mouth every 4 (four) hours as needed for severe pain. Patient not taking: Reported on 04/20/2015 03/24/15   Tanda Rockers, MD   . aspirin EC  81 mg Oral Daily  . atorvastatin  80 mg Oral q1800  . carvedilol  3.125 mg Oral BID WC  . clopidogrel  75 mg Oral Daily  . insulin aspart  0-9 Units Subcutaneous TID WC  . insulin detemir  35 Units Subcutaneous QHS  . isosorbide mononitrate  30 mg Oral Daily  . levothyroxine  175 mcg Oral QAC breakfast  . regadenoson  0.4 mg Intravenous Once   . heparin 1,000 Units/hr (04/21/15 0444)   Family History  Family History  Problem Relation Age of Onset  . Hyperlipidemia Mother   . Sudden death Father   . Emphysema Maternal Grandmother     smoked    Social History  Social History   Social History  . Marital Status: Widowed    Spouse Name: N/A  . Number of Children: N/A  . Years of Education: N/A   Occupational History  . Not on file.   Social History Main Topics  . Smoking status: Former Smoker -- 1.50 packs/day for 39 years    Types: Cigarettes  . Smokeless tobacco: Never Used  . Alcohol Use: No     Comment: 11/15/2014 "might have a few drinks/yr"  . Drug Use: Yes    Special: Marijuana     Comment: last marijuana use 03/18/2015  . Sexual Activity: Not on file   Other Topics Concern  .  Not on file   Social History Narrative     Review of Systems General:  No chills, fever, night sweats or weight changes.    Cardiovascular:  No chest pain, dyspnea on exertion, edema, orthopnea, palpitations, paroxysmal nocturnal dyspnea. Dermatological: No rash, lesions/masses Respiratory: No cough, dyspnea Urologic: No hematuria, dysuria Abdominal:   No nausea, vomiting, diarrhea, bright red blood per rectum, melena, or hematemesis Neurologic:  No visual changes, wkns, changes in mental status. All other systems reviewed and are otherwise negative except as noted above.  Physical Exam  Blood pressure 110/61, pulse 67, temperature 98.7 F (37.1 C), temperature source Oral, resp. rate 18, height $RemoveBe'5\' 2"'nRAoiVBql$  (1.575 m), weight 192 lb 12.8 oz (87.454 kg), SpO2 99 %.  General: Pleasant, NAD, rainbow colored hair Psych: Normal affect. Neuro: Alert and oriented X 3. Moves all extremities spontaneously. HEENT: Normal  Neck: Supple without bruits or JVD. Lungs:  Resp regular and unlabored, CTA. Heart: RRR no s3, s4, or murmurs. Abdomen: Soft, non-tender, non-distended, BS + x 4.  Extremities: No clubbing, cyanosis or edema. DP/PT/Radials 2+ and equal bilaterally.  Labs  Troponin [facility] Test)  Recent Labs  04/20/15 1854  TROPIPOC 0.01    Recent Labs  04/21/15 0435  TROPONINI <0.03   Lab Results  Component Value Date   WBC 11.8* 04/20/2015   HGB 15.0 04/20/2015   HCT 43.6 04/20/2015   MCV 83.8 04/20/2015   PLT 274 04/20/2015    Recent Labs Lab 04/20/15 1533  NA 134*  K 3.9  CL 98*  CO2 22  BUN 12  CREATININE 0.77  CALCIUM 9.9  GLUCOSE 310*   Lab Results  Component Value Date   CHOL 281* 11/16/2014   HDL 33* 11/16/2014   LDLCALC UNABLE TO CALCULATE IF TRIGLYCERIDE OVER 400 mg/dL 11/16/2014   TRIG 561* 11/16/2014   No results found for: DDIMER   Radiology/Studies  Dg Chest 2 View  04/20/2015  CLINICAL DATA:  Hot flashes for several weeks, began having LEFT side neck pain last night then developing central chest and LEFT arm pain this morning, shortness of breath, cardiac  history, diabetes mellitus, coronary artery disease post MI, former smoker, fibromyalgia EXAM: CHEST  2 VIEW COMPARISON:  12/01/2014 FINDINGS: Normal heart size, mediastinal contours, and pulmonary vascularity. Calcified granuloma RIGHT upper lobe. Lungs otherwise clear. No pneumothorax. Bones unremarkable. IMPRESSION: No acute abnormalities. Electronically Signed   By: Lavonia Dana M.D.   On: 04/20/2015 15:42   Ct Angio Neck W/cm &/or Wo/cm  04/20/2015  CLINICAL DATA:  Chest pain with left neck pain EXAM: CT ANGIOGRAPHY NECK TECHNIQUE: Multidetector CT imaging of the neck was performed using the standard protocol during bolus administration of intravenous contrast. Multiplanar CT image reconstructions and MIPs were obtained to evaluate the vascular anatomy. Carotid stenosis measurements (when applicable) are obtained utilizing NASCET criteria, using the distal internal carotid diameter as the denominator. CONTRAST:  71mL OMNIPAQUE IOHEXOL 350 MG/ML SOLN COMPARISON:  None. FINDINGS: Aortic arch: Mild atherosclerotic disease in the aortic arch and proximal great vessels. Negative for dissection or aneurysm. Lung apices clear. Right carotid system: Mild atherosclerotic disease at the right carotid bifurcation without significant stenosis. Negative for dissection Left carotid system: Mild atherosclerotic disease of the carotid bifurcation without significant stenosis. Negative for dissection Vertebral arteries:Both vertebral arteries are widely patent without atherosclerotic disease or dissection. Skeleton: No acute skeletal abnormality. No significant degenerative change. Other neck: Negative for adenopathy or mass. IMPRESSION: Negative CTA of the neck. Electronically  Signed   By: Franchot Gallo M.D.   On: 04/20/2015 20:54   Ct Angio Chest Aorta W/cm &/or Wo/cm  04/20/2015  CLINICAL DATA:  Mid chest pain radiating to the left EXAM: CT ANGIOGRAPHY CHEST WITH CONTRAST TECHNIQUE: Multidetector CT imaging of the  chest was performed using the standard protocol during bolus administration of intravenous contrast. Multiplanar CT image reconstructions and MIPs were obtained to evaluate the vascular anatomy. CONTRAST:  80 mL OMNIPAQUE 350 MG/ML SOLN COMPARISON:  02/10/2015 FINDINGS: The lungs are well aerated bilaterally. No focal infiltrate or sizable effusion is noted. There are multiple nodules identified bilaterally. Some of these are calcified and consistent with calcified granulomas. Some are better visualized on the previous exam than on the current exam and may be postinflammatory in nature. A small ground-glass area is noted in the left lower lobe measuring approximately 7 mm best seen on image number 51 of series 7. The thoracic inlet is within normal limits. The thoracic aorta demonstrates some mild atherosclerotic calcifications without evidence of aneurysmal dilatation or focal dissection. No intimal irregularities are identified. Heavy coronary calcifications are noted. The pulmonary artery is within normal limits although opacification is somewhat limited. No findings of right heart strain are noted. The upper abdomen is within normal limits as visualized. The bony structures show no acute abnormality. Review of the MIP images confirms the above findings. IMPRESSION: No evidence of thoracic aortic aneurysm or dissection. Multiple calcified and noncalcified nodules. Some of these are less well visualized on the current exam than on the prior study and may have been postinflammatory in nature. Again these likely represent granulomas although followup in 9 months to complete a year follow-up is recommended. A nodule seen previously in the left lower lobe now demonstrates a slightly more ground-glass appearance and measures approximately 7 mm which is an increase in size. Again this may be postinflammatory in nature although followup examination is again recommended as described above. Electronically Signed   By: Inez Catalina M.D.   On: 04/20/2015 20:52    ECG  Sinus tach 104 bpm     ASSESSMENT AND PLAN  Principal Problem:   Chest pain Active Problems:   Type 2 diabetes, controlled, with peripheral circulatory disorder (HCC)   Essential hypertension   CAD S/P DES PCI to LAD (3) & OM1-dCx bifurcation (minicrush)   Chronic pain syndrome   Incidental lung nodule, > 64m and < 864m  1. Chest Pain: Patient has known CAD history, however her chest pain is atypical. POC troponin negative x 2. Acutal lab troponin negative x 1 with serial lab troponins pending. She is currently CP free. EKG is unremarkable. CT of chest is negative. Recommend a NST to rule out ischemia.   2. HTN: BP is well controlled on current regimen.   3. HLD: continue Lipitor.  4. T2DM: on insulin. Management per primary.   5. CAD: h/o NSTEMI 10/2014. Known occluded RCA. S/p Bifurcation PCI to circumflex-OM, followed by staged PCI of the proximal LAD overlapping the mid LAD stent in 11/2014. Recent CP is atypical, but will consider NST to r/o ischemia. For now, continue ASA, Plavix, Lipitor, Coreg and Imdur.   Signed, SILyda JesterPA-C 04/21/2015, 8:56 AM   Patient seen and examined. Agree with assessment and plan.  Jacqueline Munoz a [age]ear old female who has a history of obesity, type 2 diabetes mellitus, hypertension, hypothyroidism, and coronary obstructive disease.  In May 2016.  She suffered a non-ST segment elevation myocardial infarction  and underwent initial intervention with PCI involving bifurcation stenoses of the circumflex and marginal vessel with subsequent staged PCI to the proximal LAD.  She has documented occluded RCA.  She admits to medication compliance.  She was admitted with left-sided neck pain rating down her left arm with substernal chest tightness.  She also admitted to having a hot flash sensation.  She states her pain is somewhat different from her myocardial infarction pain.  Initial CT was  negative for PE or dissection.  Initial cardiac markers are negative.  Her ECG initially showed sinus tachycardia at 102, without ST segment changes.  Presently, she is pain-free.  She was started on intravenous heparin therapy.  She is not well beta blocked on her current dose of carvedilol and would titrate to 6.25 mg twice a day as blood pressure and pulse allow.  Since she is nothing by mouth.  Presently, will plan to perform a Lexiscan nuclear stress test later today to make certain no significant abnormalities in the circumflex or LAD territory.  I anticipate this to be abnormal in the RCA distribution due to her known RCA occlusion.  In May 2016, her lipids were markedly abnormal with triglycerides greater than 500; she has been on atorvastatin, but has not been on omega-3 fatty acid or fenofibrate.  Would recommend rechecking a fasting lipid panel and medication adjustment if necessary.   Troy Sine, MD, [facility] 04/21/2015 10:51 AM

## 2015-04-21 NOTE — H&P (Addendum)
[facility] History and Physical  Davionne Mastrangelo XLK:[id number] DOB: [birth year] DOA: 04/20/2015  Referring physician: Dr. Amedeo Gory. PCP: [name], MD  Specialists: Dr.Harding.  Chief Complaint: Chest pain.  HPI: Jacqueline Munoz is a 54 y.o. female history of CAD status post stenting in June 2016 presently here because of chest pain. Patient states since yesterday morning around 10 AM after waking up patient has been having chest pain which was retrosternal radiating to her neck and back. Patient also has been having some left-sided headache. Denies any focal deficits. Denies any shortness of breath. Patient called up her cardiology office and was instructed to come to the ER. Since patient had chest pain and neck pain ongoing patient had CT angiogram of the neck and also chest which were unremarkable. Patient chest pain improved with narcotics but still has mild persistent dull pain. Cardiac markers were negative. Patient has been admitted for chest pain concerning for unstable angina. Patient denies any nausea vomiting. Has chronic abdominal pain.   Review of Systems: As presented in the history of presenting illness, rest negative.  Past Medical History  Diagnosis Date  . Fibromyalgia  Dx 2004  . Tobacco abuse     At 54 years old  . Hypothyroid Dx 2004  . Family history of adverse reaction to anesthesia     "mother died twice during surgery; they brought her back both times" (11/15/2014)  . Sleep apnea     "dx'd; don't wear a mask" (11/15/2014)  . History of stomach ulcers 1981  . Migraine     "couple/month" (11/15/2014)  . Headache     "@ least weekly" (11/15/2014)  . Arthritis     "hips, ankles, knees, hands, back" (11/15/2014)  . Chronic back pain     "all over"  . Anxiety     "social"  . Chronic depression     At age of 54years old  . Kidney stones     "I've had several"  . Poorly controlled type 2 diabetes mellitus with circulatory disorder ([place]) 11/15/2014  .  Essential hypertension 11/15/2014  . Hyperlipidemia associated with type 2 diabetes mellitus ([place]) 11/15/2014  . NSTEMI (non-ST elevated myocardial infarction) ([place]) 11/15/2014  . CAD S/P percutaneous coronary angioplasty 5/18/'16 ; 6/3/'16    a) NSTEMI: m-d LAD 90% -->0% Post PCI - 2 overlap DES ( Promus Premier DES 3.0 x 16 - 2.75  x 16), m-dCx 95%--> 0% post DES PCI (Synergy DES 2.25 x 38 -mini-crushed @ [place] OM1 , Ostial-prox OM1 90% -> 0% post DES PCI (Synergy DES 2.75 x 24 -> 3.0 mm) - bifurcation minicrush of mCx stent;; b) UA/ACS: mLAD upstream - 3rd overlapping Promus DES 3.0 x 20 (3.25 mm)   . Coronary artery chronic total occlusion: 100% RCA CTO 11/16/2014    Prox RCA to Mid RCA lesion, 100% stenosed.   . Type IVa MI - Peri-PCI 11/17/2014    Prolonged ischemia during bifurcation PCI of  m-d Cx-OM1 with minicrush technique   Past Surgical History  Procedure Laterality Date  . Cesarean section  2000  . Inner ear surgery Bilateral "several"  . Tonsillectomy    . Total abdominal hysterectomy  ~ 2008  . Tubal ligation  2000  . Cardiac catheterization N/A 11/16/2014    Procedure: Left Heart Cath and Coronary Angiography;  Surgeon: Peter M Martinique, MD;  Location: [facility];  Service: Cardiovascular;  RCA 100% CTO, m-dLAD 90%, m-dCx 95%, OM1 90%  . Cardiac catheterization  11/16/2014    Procedure: Coronary Stent Intervention;  Surgeon: Peter M Martinique, MD;  Location: [facility];  Service: Cardiovascular;;m-d LAD overlapping 3.0 x 16 & 2.75 x 16 Promus P DES; OM1-dCx Minicrush bifurcation (dCx 2.25 x 38 Synergy DES crushed with 2.75 x 24 Synergy DES in OM1)   . Cardiac catheterization N/A 12/02/2014    Procedure: Left Heart Cath and Coronary Angiography;  Surgeon: Leonie Man, MD;  Location: [facility];  Service: Cardiovascular;  Previous Stents in LAD & Cx-OM patent, mLAD prox to stents 99%  . Cardiac catheterization N/A 12/02/2014    Procedure: Coronary Stent  Intervention;  Surgeon: Leonie Man, MD;  Location: [facility];  Service: Cardiovascular;  3rd overlapping (prox) mLAD Promus Premier DES 3.0 x 20 (3.25 mm)  . Transthoracic echocardiogram  5/19 & 6/ 2016    a) EF 65-70%, mild LVH, Gr 1 DD; b) EF 60% -ron RWMA, no valve lesions   Social History:  reports that she has quit smoking. Her smoking use included Cigarettes. She has a 58.5 pack-year smoking history. She has never used smokeless tobacco. She reports that she uses illicit drugs (Marijuana). She reports that she does not drink alcohol. Where does patient live home. Can patient participate in ADLs? Yes.  Allergies  Allergen Reactions  . Corticosteroids Anaphylaxis    ALL steroids.     . Penicillins Anaphylaxis    Has patient had a PCN reaction causing immediate rash, facial/tongue/throat swelling, SOB or lightheadedness with hypotension: {Yes Has patient had a PCN reaction causing severe rash involving mucus membranes or skin necrosis: NO Has patient had a PCN reaction that required hospitalization NO Has patient had a PCN reaction occurring within the last 10 years: {Yes If all of the above answers are "NO", then may proceed with Cephalosporin use.     Family History:  Family History  Problem Relation Age of Onset  . Hyperlipidemia Mother   . Sudden death Father   . Emphysema Maternal Grandmother     smoked      Prior to Admission medications   Medication Sig Start Date End Date Taking? Authorizing Provider  aspirin EC 81 MG EC tablet Take 1 tablet (81 mg total) by mouth daily. 11/18/14  Yes Debbe Odea, MD  atorvastatin (LIPITOR) 80 MG tablet Take 1 tablet (80 mg total) by mouth daily at 6 PM. 12/14/14  Yes Arnoldo Morale, MD  carvedilol (COREG) 3.125 MG tablet Take 1 tablet (3.125 mg total) by mouth 2 (two) times daily with a meal. 03/20/15  Yes Josalyn Funches, MD  clopidogrel (PLAVIX) 75 MG tablet Take 1 tablet (75 mg total) by mouth daily. 01/05/15  Yes Josalyn  Funches, MD  furosemide (LASIX) 20 MG tablet Take 10 mg by mouth every other day.   Yes Historical Provider, MD  ibuprofen (ADVIL,MOTRIN) 200 MG tablet Take 800 mg by mouth every 8 (eight) hours as needed for mild pain or moderate pain (Back pain).    Yes Historical Provider, MD  Insulin Detemir (LEVEMIR FLEXPEN) 100 UNIT/ML Pen Inject 35 Units into the skin at bedtime. 03/30/15  Yes Josalyn Funches, MD  isosorbide mononitrate (IMDUR) 30 MG 24 hr tablet Take 1 tablet (30 mg total) by mouth daily. 12/22/14  Yes Leonie Man, MD  levothyroxine (SYNTHROID, LEVOTHROID) 175 MCG tablet Take 1 tablet (175 mcg total) by mouth daily before breakfast. 01/25/15  Yes Tresa Garter, MD  meclizine (ANTIVERT) 25 MG tablet Take  1 tablet (25 mg total) by mouth 2 (two) times daily as needed for dizziness. 12/22/14  Yes Leonie Man, MD  metFORMIN (GLUCOPHAGE) 500 MG tablet Take 1 tablet (500 mg total) by mouth 2 (two) times daily with a meal. 03/20/15  Yes Josalyn Funches, MD  nitroGLYCERIN (NITROSTAT) 0.4 MG SL tablet Place 0.4 mg under the tongue every 5 (five) minutes as needed for chest pain (may take three doses by mouth per day as needed).   Yes Historical Provider, MD  olmesartan (BENICAR) 20 MG tablet Take 1 tablet (20 mg total) by mouth daily. 03/24/15  Yes Tanda Rockers, MD  oxyCODONE-acetaminophen (PERCOCET/ROXICET) 5-325 MG tablet Take 3 tablets by mouth daily. Take every day per patient   Yes Historical Provider, MD  Elastic Bandages & Supports (MEDICAL COMPRESSION STOCKINGS) MISC Wear daily 30 mm Hg pressure 03/20/15   Josalyn Funches, MD  furosemide (LASIX) 20 MG tablet TAKE 1/2 TABLET EVERY OTHER DAY OR AS NEEDED Patient not taking: Reported on 04/20/2015 03/23/15   Leonie Man, MD  glucose monitoring kit (FREESTYLE) monitoring kit 1 each by Does not apply route 4 (four) times daily - after meals and at bedtime. 1 month Diabetic Testing Supplies for QAC-QHS accuchecks. 11/16/14   Debbe Odea, MD   Insulin Syringe-Needle U-100 (TRUEPLUS INSULIN SYRINGE) 30G X 5/16" 0.5 ML MISC 1 each by Does not apply route 4 (four) times daily. 03/02/15   Josalyn Funches, MD  oxyCODONE-acetaminophen (ROXICET) 5-325 MG per tablet Take 1-2 tablets by mouth every 4 (four) hours as needed for severe pain. Patient not taking: Reported on 04/20/2015 03/24/15   Tanda Rockers, MD    Physical Exam: Filed Vitals:   04/20/15 2345 04/21/15 0000 04/21/15 0035 04/21/15 0313  BP: 104/58 109/48 115/70 118/56  Pulse: 72 65  71  Temp:   98.4 F (36.9 C)   TempSrc:   Oral   Resp: _0 Height:   5' 2" (1.575 m)   Weight:   87.454 kg (192 lb 12.8 oz)   SpO2: 99% 98% 98%      General:  Moderately built and nourished.  Eyes: Anicteric no pallor.  ENT: No discharge from the ears eyes nose and mouth.  Neck: No mass felt.  Cardiovascular: S1-S2 heard.  Respiratory: No rhonchi or crepitations.  Abdomen: Soft nontender bowel sounds present.  Skin: No rash.  Musculoskeletal: No edema.  Psychiatric: Appears normal.  Neurologic: Alert awake oriented to time place and person. Moves all extremities.  Labs on Admission:  Basic Metabolic Panel:  Recent Labs Lab 04/20/15 1533  NA 134*  K 3.9  CL 98*  CO2 22  GLUCOSE 310*  BUN 12  CREATININE 0.77  CALCIUM 9.9   Liver Function Tests: No results for input(s): AST, ALT, ALKPHOS, BILITOT, PROT, ALBUMIN in the last 168 hours. No results for input(s): LIPASE, AMYLASE in the last 168 hours. No results for input(s): AMMONIA in the last 168 hours. CBC:  Recent Labs Lab 04/20/15 1533  WBC 11.8*  HGB 15.0  HCT 43.6  MCV 83.8  PLT 274   Cardiac Enzymes: No results for input(s): CKTOTAL, CKMB, CKMBINDEX, TROPONINI in the last 168 hours.  BNP (last 3 results)  Recent Labs  12/01/14 2337 04/20/15 2056  BNP 127.8* 17.4    ProBNP (last 3 results) No results for input(s): PROBNP in the last 8760 hours.  CBG:  Recent Labs Lab  04/21/15 0039  GLUCAP 227*  Radiological Exams on Admission: Dg Chest 2 View  04/20/2015  CLINICAL DATA:  Hot flashes for several weeks, began having LEFT side neck pain last night then developing central chest and LEFT arm pain this morning, shortness of breath, cardiac history, diabetes mellitus, coronary artery disease post MI, former smoker, fibromyalgia EXAM: CHEST  2 VIEW COMPARISON:  12/01/2014 FINDINGS: Normal heart size, mediastinal contours, and pulmonary vascularity. Calcified granuloma RIGHT upper lobe. Lungs otherwise clear. No pneumothorax. Bones unremarkable. IMPRESSION: No acute abnormalities. Electronically Signed   By: Lavonia Dana M.D.   On: 04/20/2015 15:42   Ct Angio Neck W/cm &/or Wo/cm  04/20/2015  CLINICAL DATA:  Chest pain with left neck pain EXAM: CT ANGIOGRAPHY NECK TECHNIQUE: Multidetector CT imaging of the neck was performed using the standard protocol during bolus administration of intravenous contrast. Multiplanar CT image reconstructions and MIPs were obtained to evaluate the vascular anatomy. Carotid stenosis measurements (when applicable) are obtained utilizing NASCET criteria, using the distal internal carotid diameter as the denominator. CONTRAST:  44m OMNIPAQUE IOHEXOL 350 MG/ML SOLN COMPARISON:  None. FINDINGS: Aortic arch: Mild atherosclerotic disease in the aortic arch and proximal great vessels. Negative for dissection or aneurysm. Lung apices clear. Right carotid system: Mild atherosclerotic disease at the right carotid bifurcation without significant stenosis. Negative for dissection Left carotid system: Mild atherosclerotic disease of the carotid bifurcation without significant stenosis. Negative for dissection Vertebral arteries:Both vertebral arteries are widely patent without atherosclerotic disease or dissection. Skeleton: No acute skeletal abnormality. No significant degenerative change. Other neck: Negative for adenopathy or mass. IMPRESSION: Negative  CTA of the neck. Electronically Signed   By: CFranchot GalloM.D.   On: 04/20/2015 20:54   Ct Angio Chest Aorta W/cm &/or Wo/cm  04/20/2015  CLINICAL DATA:  Mid chest pain radiating to the left EXAM: CT ANGIOGRAPHY CHEST WITH CONTRAST TECHNIQUE: Multidetector CT imaging of the chest was performed using the standard protocol during bolus administration of intravenous contrast. Multiplanar CT image reconstructions and MIPs were obtained to evaluate the vascular anatomy. CONTRAST:  80 mL OMNIPAQUE 350 MG/ML SOLN COMPARISON:  02/10/2015 FINDINGS: The lungs are well aerated bilaterally. No focal infiltrate or sizable effusion is noted. There are multiple nodules identified bilaterally. Some of these are calcified and consistent with calcified granulomas. Some are better visualized on the previous exam than on the current exam and may be postinflammatory in nature. A small ground-glass area is noted in the left lower lobe measuring approximately 7 mm best seen on image number 51 of series 7. The thoracic inlet is within normal limits. The thoracic aorta demonstrates some mild atherosclerotic calcifications without evidence of aneurysmal dilatation or focal dissection. No intimal irregularities are identified. Heavy coronary calcifications are noted. The pulmonary artery is within normal limits although opacification is somewhat limited. No findings of right heart strain are noted. The upper abdomen is within normal limits as visualized. The bony structures show no acute abnormality. Review of the MIP images confirms the above findings. IMPRESSION: No evidence of thoracic aortic aneurysm or dissection. Multiple calcified and noncalcified nodules. Some of these are less well visualized on the current exam than on the prior study and may have been postinflammatory in nature. Again these likely represent granulomas although followup in 9 months to complete a year follow-up is recommended. A nodule seen previously in the  left lower lobe now demonstrates a slightly more ground-glass appearance and measures approximately 7 mm which is an increase in size. Again this may be postinflammatory in  nature although followup examination is again recommended as described above. Electronically Signed   By: Inez Catalina M.D.   On: 04/20/2015 20:52    EKG: Independently reviewed. Sinus tachycardia.  Assessment/Plan Principal Problem:   Chest pain Active Problems:   Type 2 diabetes, controlled, with peripheral circulatory disorder (HCC)   Essential hypertension   CAD S/P DES PCI to LAD (3) & OM1-dCx bifurcation (minicrush)   Chronic pain syndrome   Incidental lung nodule, > 37m and < 859m  1. Chest pain concerning for unstable angina - patient still has mild retrosternal chest pressure. I have placed patient on heparin infusion, when necessary nitroglycerin and depressive agents and continue with her Coreg and statins. Will keep patient nothing by mouth in anticipation of possible cardiac procedure. I have requested cardiology consult. 2. Diabetes mellitus type 2 with hyperglycemia - patient has not taken her Levemir last night so I have ordered 1 dose now. Closely follow CBG sliding scale coverage. Hold metformin in anticipation of procedure. 3. Hypertension - patient is mildly hypotensive in the ER for which patient was given 500 mL bolus. At this time holding all patients ARB but will continue other medications. Presently blood pressure is around 12073ystolic. 4. Lung nodule - was again seen in the CT chest. Patient will need follow-up as outpatient. 5. Chronic pain - continue home medications. 6. Hypothyroidism on Synthroid. 7. Headache - mostly left temporal side. Improved with narcotics. Patient appears nonfocal. Check sedimentation rate.  I have reviewed patient's old charts are labs. Personally reviewed patient's chest x-ray and EKG.   DVT Prophylaxis Lovenox.  Code Status: Full code.  Family Communication:  Discussed with patient.  Disposition Plan: Admit for observation.    KAKRAKANDY,ARSHAD N. [facility] Pager [phone] If 7PM-7AM, please contact night-coverage [email or web address] Password [email or web address] 04/21/2015, 3:42 AM

## 2015-05-04 DIAGNOSIS — R200 Anesthesia of skin: Secondary | ICD-10-CM | POA: Diagnosis not present

## 2015-05-04 DIAGNOSIS — M545 Low back pain: Secondary | ICD-10-CM | POA: Diagnosis not present

## 2015-05-09 DIAGNOSIS — G894 Chronic pain syndrome: Secondary | ICD-10-CM | POA: Diagnosis not present

## 2015-05-09 DIAGNOSIS — Z79899 Other long term (current) drug therapy: Secondary | ICD-10-CM | POA: Diagnosis not present

## 2015-05-09 DIAGNOSIS — M47817 Spondylosis without myelopathy or radiculopathy, lumbosacral region: Secondary | ICD-10-CM | POA: Diagnosis not present

## 2015-05-29 DIAGNOSIS — I252 Old myocardial infarction: Secondary | ICD-10-CM | POA: Diagnosis not present

## 2015-05-29 DIAGNOSIS — R490 Dysphonia: Secondary | ICD-10-CM | POA: Diagnosis not present

## 2015-05-29 DIAGNOSIS — Z7902 Long term (current) use of antithrombotics/antiplatelets: Secondary | ICD-10-CM | POA: Diagnosis not present

## 2015-05-29 DIAGNOSIS — J383 Other diseases of vocal cords: Secondary | ICD-10-CM | POA: Diagnosis not present

## 2015-05-29 DIAGNOSIS — J384 Edema of larynx: Secondary | ICD-10-CM | POA: Diagnosis not present

## 2015-05-29 DIAGNOSIS — Z7982 Long term (current) use of aspirin: Secondary | ICD-10-CM | POA: Diagnosis not present

## 2015-05-29 DIAGNOSIS — Z87891 Personal history of nicotine dependence: Secondary | ICD-10-CM | POA: Diagnosis not present

## 2015-06-06 DIAGNOSIS — G894 Chronic pain syndrome: Secondary | ICD-10-CM | POA: Diagnosis not present

## 2015-06-06 DIAGNOSIS — Z79899 Other long term (current) drug therapy: Secondary | ICD-10-CM | POA: Diagnosis not present

## 2015-06-06 DIAGNOSIS — M545 Low back pain: Secondary | ICD-10-CM | POA: Diagnosis not present

## 2015-06-12 ENCOUNTER — Emergency Department (HOSPITAL_COMMUNITY): Payer: Medicare Other

## 2015-06-12 ENCOUNTER — Observation Stay (HOSPITAL_BASED_OUTPATIENT_CLINIC_OR_DEPARTMENT_OTHER): Payer: Medicare Other

## 2015-06-12 ENCOUNTER — Inpatient Hospital Stay (HOSPITAL_COMMUNITY)
Admission: EM | Admit: 2015-06-12 | Discharge: 2015-06-15 | DRG: 293 | Disposition: A | Discharge status: Home / Self Care | Payer: Medicare Other | Attending: Interventional Cardiology | Admitting: Interventional Cardiology

## 2015-06-12 ENCOUNTER — Encounter (HOSPITAL_COMMUNITY): Payer: Self-pay

## 2015-06-12 DIAGNOSIS — R0600 Dyspnea, unspecified: Secondary | ICD-10-CM

## 2015-06-12 DIAGNOSIS — E039 Hypothyroidism, unspecified: Secondary | ICD-10-CM | POA: Diagnosis not present

## 2015-06-12 DIAGNOSIS — R911 Solitary pulmonary nodule: Secondary | ICD-10-CM | POA: Diagnosis present

## 2015-06-12 DIAGNOSIS — R490 Dysphonia: Secondary | ICD-10-CM | POA: Diagnosis present

## 2015-06-12 DIAGNOSIS — I509 Heart failure, unspecified: Secondary | ICD-10-CM

## 2015-06-12 DIAGNOSIS — I119 Hypertensive heart disease without heart failure: Secondary | ICD-10-CM | POA: Diagnosis not present

## 2015-06-12 DIAGNOSIS — G894 Chronic pain syndrome: Secondary | ICD-10-CM | POA: Diagnosis present

## 2015-06-12 DIAGNOSIS — Z6837 Body mass index (BMI) 37.0-37.9, adult: Secondary | ICD-10-CM

## 2015-06-12 DIAGNOSIS — I2510 Atherosclerotic heart disease of native coronary artery without angina pectoris: Secondary | ICD-10-CM | POA: Diagnosis present

## 2015-06-12 DIAGNOSIS — I5033 Acute on chronic diastolic (congestive) heart failure: Secondary | ICD-10-CM | POA: Diagnosis not present

## 2015-06-12 DIAGNOSIS — Z79899 Other long term (current) drug therapy: Secondary | ICD-10-CM

## 2015-06-12 DIAGNOSIS — F329 Major depressive disorder, single episode, unspecified: Secondary | ICD-10-CM | POA: Diagnosis present

## 2015-06-12 DIAGNOSIS — I10 Essential (primary) hypertension: Secondary | ICD-10-CM | POA: Diagnosis not present

## 2015-06-12 DIAGNOSIS — E1159 Type 2 diabetes mellitus with other circulatory complications: Secondary | ICD-10-CM | POA: Diagnosis not present

## 2015-06-12 DIAGNOSIS — Z87891 Personal history of nicotine dependence: Secondary | ICD-10-CM | POA: Diagnosis not present

## 2015-06-12 DIAGNOSIS — I110 Hypertensive heart disease with heart failure: Secondary | ICD-10-CM | POA: Diagnosis not present

## 2015-06-12 DIAGNOSIS — Z880 Allergy status to penicillin: Secondary | ICD-10-CM

## 2015-06-12 DIAGNOSIS — R0602 Shortness of breath: Secondary | ICD-10-CM | POA: Diagnosis not present

## 2015-06-12 DIAGNOSIS — E119 Type 2 diabetes mellitus without complications: Secondary | ICD-10-CM

## 2015-06-12 DIAGNOSIS — Z7982 Long term (current) use of aspirin: Secondary | ICD-10-CM

## 2015-06-12 DIAGNOSIS — Z7984 Long term (current) use of oral hypoglycemic drugs: Secondary | ICD-10-CM

## 2015-06-12 DIAGNOSIS — F419 Anxiety disorder, unspecified: Secondary | ICD-10-CM | POA: Diagnosis present

## 2015-06-12 DIAGNOSIS — Z794 Long term (current) use of insulin: Secondary | ICD-10-CM

## 2015-06-12 DIAGNOSIS — E782 Mixed hyperlipidemia: Secondary | ICD-10-CM | POA: Diagnosis not present

## 2015-06-12 DIAGNOSIS — E785 Hyperlipidemia, unspecified: Secondary | ICD-10-CM | POA: Diagnosis present

## 2015-06-12 DIAGNOSIS — E876 Hypokalemia: Secondary | ICD-10-CM | POA: Diagnosis not present

## 2015-06-12 DIAGNOSIS — Z79891 Long term (current) use of opiate analgesic: Secondary | ICD-10-CM

## 2015-06-12 DIAGNOSIS — E669 Obesity, unspecified: Secondary | ICD-10-CM | POA: Diagnosis present

## 2015-06-12 DIAGNOSIS — I5031 Acute diastolic (congestive) heart failure: Secondary | ICD-10-CM | POA: Diagnosis not present

## 2015-06-12 DIAGNOSIS — I248 Other forms of acute ischemic heart disease: Secondary | ICD-10-CM

## 2015-06-12 DIAGNOSIS — Z9861 Coronary angioplasty status: Secondary | ICD-10-CM

## 2015-06-12 DIAGNOSIS — I5032 Chronic diastolic (congestive) heart failure: Secondary | ICD-10-CM

## 2015-06-12 DIAGNOSIS — M797 Fibromyalgia: Secondary | ICD-10-CM | POA: Diagnosis present

## 2015-06-12 DIAGNOSIS — I249 Acute ischemic heart disease, unspecified: Secondary | ICD-10-CM | POA: Diagnosis not present

## 2015-06-12 DIAGNOSIS — Z955 Presence of coronary angioplasty implant and graft: Secondary | ICD-10-CM

## 2015-06-12 DIAGNOSIS — I252 Old myocardial infarction: Secondary | ICD-10-CM

## 2015-06-12 DIAGNOSIS — G4733 Obstructive sleep apnea (adult) (pediatric): Secondary | ICD-10-CM | POA: Diagnosis present

## 2015-06-12 DIAGNOSIS — E1169 Type 2 diabetes mellitus with other specified complication: Secondary | ICD-10-CM | POA: Diagnosis present

## 2015-06-12 HISTORY — DX: Hyperlipidemia, unspecified: E78.5

## 2015-06-12 HISTORY — DX: Type 2 diabetes mellitus without complications: E11.9

## 2015-06-12 HISTORY — DX: Personal history of nicotine dependence: Z87.891

## 2015-06-12 HISTORY — DX: Obstructive sleep apnea (adult) (pediatric): G47.33

## 2015-06-12 LAB — BASIC METABOLIC PANEL
Anion gap: 8 (ref 5–15)
CO2: 25 mmol/L (ref 22–32)
Calcium: 8.6 mg/dL — ABNORMAL LOW (ref 8.9–10.3)
Chloride: 107 mmol/L (ref 101–111)
Creatinine, Ser: 0.56 mg/dL (ref 0.44–1.00)
GFR calc Af Amer: >60 mL/min (ref >60)
GLUCOSE: 184 mg/dL — AB (ref 65–99)
POTASSIUM: 3.4 mmol/L — AB (ref 3.5–5.1)
Sodium: 140 mmol/L (ref 135–145)

## 2015-06-12 LAB — GLUCOSE, CAPILLARY
GLUCOSE-CAPILLARY: 139 mg/dL — AB (ref 65–99)
Glucose-Capillary: 179 mg/dL — ABNORMAL HIGH (ref 65–99)

## 2015-06-12 LAB — BRAIN NATRIURETIC PEPTIDE: B Natriuretic Peptide: 609.2 pg/mL — ABNORMAL HIGH (ref 0.0–100.0)

## 2015-06-12 LAB — TROPONIN I
TROPONIN I: 0.47 ng/mL — AB (ref <0.031)
Troponin I: 0.16 ng/mL — ABNORMAL HIGH (ref <0.031)
Troponin I: 0.53 ng/mL (ref <0.031)

## 2015-06-12 LAB — CBC WITH DIFFERENTIAL/PLATELET
Basophils Absolute: 0 10*3/uL (ref 0.0–0.1)
Basophils Relative: 1 %
EOS ABS: 0 10*3/uL (ref 0.0–0.7)
Eosinophils Relative: 0 %
HEMATOCRIT: 38.3 % (ref 36.0–46.0)
HEMOGLOBIN: 12.7 g/dL (ref 12.0–15.0)
LYMPHS ABS: 1.5 10*3/uL (ref 0.7–4.0)
LYMPHS PCT: 19 %
MCH: 29.4 pg (ref 26.0–34.0)
MCHC: 33.2 g/dL (ref 30.0–36.0)
MCV: 88.7 fL (ref 78.0–100.0)
MONOS PCT: 5 %
Monocytes Absolute: 0.4 10*3/uL (ref 0.1–1.0)
NEUTROS PCT: 75 %
Neutro Abs: 5.8 10*3/uL (ref 1.7–7.7)
Platelets: 153 10*3/uL (ref 150–400)
RBC: 4.32 MIL/uL (ref 3.87–5.11)
RDW: 15 % (ref 11.5–15.5)
WBC: 7.8 10*3/uL (ref 4.0–10.5)

## 2015-06-12 LAB — CBG MONITORING, ED: GLUCOSE-CAPILLARY: 134 mg/dL — AB (ref 65–99)

## 2015-06-12 LAB — TSH: TSH: 8.157 u[IU]/mL — ABNORMAL HIGH (ref 0.350–4.500)

## 2015-06-12 LAB — HEPARIN LEVEL (UNFRACTIONATED): Heparin Unfractionated: <0.1 IU/mL — ABNORMAL LOW (ref 0.30–0.70)

## 2015-06-12 MED ORDER — HEPARIN BOLUS VIA INFUSION
2000.0000 [IU] | Freq: Once | INTRAVENOUS | Status: AC
  Administered 2015-06-12: 2000 [IU] via INTRAVENOUS
  Filled 2015-06-12: qty 2000

## 2015-06-12 MED ORDER — ASPIRIN EC 81 MG PO TBEC
81.0000 mg | DELAYED_RELEASE_TABLET | Freq: Every day | ORAL | Status: DC
  Administered 2015-06-12 – 2015-06-15 (×4): 81 mg via ORAL
  Filled 2015-06-12 (×5): qty 1

## 2015-06-12 MED ORDER — LISINOPRIL 10 MG PO TABS
10.0000 mg | ORAL_TABLET | Freq: Every day | ORAL | Status: DC
  Administered 2015-06-12 – 2015-06-15 (×4): 10 mg via ORAL
  Filled 2015-06-12 (×4): qty 1

## 2015-06-12 MED ORDER — ISOSORBIDE MONONITRATE ER 30 MG PO TB24
30.0000 mg | ORAL_TABLET | Freq: Every day | ORAL | Status: DC
  Administered 2015-06-12 – 2015-06-15 (×4): 30 mg via ORAL
  Filled 2015-06-12 (×4): qty 1

## 2015-06-12 MED ORDER — ONDANSETRON HCL 4 MG/2ML IJ SOLN: 4.0000 mg | Freq: Four times a day (QID) | INTRAMUSCULAR | Status: DC | PRN

## 2015-06-12 MED ORDER — CLOPIDOGREL BISULFATE 75 MG PO TABS
75.0000 mg | ORAL_TABLET | Freq: Every day | ORAL | Status: DC
  Administered 2015-06-12 – 2015-06-15 (×4): 75 mg via ORAL
  Filled 2015-06-12 (×4): qty 1

## 2015-06-12 MED ORDER — OXYCODONE-ACETAMINOPHEN 5-325 MG PO TABS
1.0000 | ORAL_TABLET | ORAL | Status: DC | PRN
  Administered 2015-06-12 – 2015-06-14 (×11): 2 via ORAL
  Administered 2015-06-14: 1 via ORAL
  Administered 2015-06-14 (×2): 2 via ORAL
  Administered 2015-06-14: 1 via ORAL
  Administered 2015-06-15 (×3): 2 via ORAL
  Filled 2015-06-12 (×12): qty 2
  Filled 2015-06-12: qty 1
  Filled 2015-06-12 (×5): qty 2

## 2015-06-12 MED ORDER — POTASSIUM CHLORIDE CRYS ER 20 MEQ PO TBCR
20.0000 meq | EXTENDED_RELEASE_TABLET | Freq: Two times a day (BID) | ORAL | Status: DC
  Administered 2015-06-12 – 2015-06-15 (×7): 20 meq via ORAL
  Filled 2015-06-12 (×7): qty 1

## 2015-06-12 MED ORDER — SODIUM CHLORIDE 0.9 % IJ SOLN
3.0000 mL | Freq: Two times a day (BID) | INTRAMUSCULAR | Status: DC
  Administered 2015-06-12 – 2015-06-15 (×5): 3 mL via INTRAVENOUS

## 2015-06-12 MED ORDER — SODIUM CHLORIDE 0.9 % IV SOLN: 250.0000 mL | INTRAVENOUS | Status: DC | PRN

## 2015-06-12 MED ORDER — POTASSIUM CHLORIDE CRYS ER 20 MEQ PO TBCR
40.0000 meq | EXTENDED_RELEASE_TABLET | Freq: Once | ORAL | Status: AC
  Administered 2015-06-12: 40 meq via ORAL
  Filled 2015-06-12: qty 2

## 2015-06-12 MED ORDER — LEVOTHYROXINE SODIUM 175 MCG PO TABS
175.0000 ug | ORAL_TABLET | Freq: Every day | ORAL | Status: DC
  Administered 2015-06-13 – 2015-06-14 (×2): 175 ug via ORAL
  Filled 2015-06-12 (×5): qty 1

## 2015-06-12 MED ORDER — FUROSEMIDE 10 MG/ML IJ SOLN
40.0000 mg | Freq: Two times a day (BID) | INTRAMUSCULAR | Status: DC
  Administered 2015-06-12 – 2015-06-14 (×4): 40 mg via INTRAVENOUS
  Filled 2015-06-12 (×4): qty 4

## 2015-06-12 MED ORDER — ACETAMINOPHEN 325 MG PO TABS: 650.0000 mg | ORAL_TABLET | ORAL | Status: DC | PRN

## 2015-06-12 MED ORDER — HEPARIN BOLUS VIA INFUSION
4000.0000 [IU] | Freq: Once | INTRAVENOUS | Status: AC
  Administered 2015-06-12: 4000 [IU] via INTRAVENOUS
  Filled 2015-06-12: qty 4000

## 2015-06-12 MED ORDER — ATORVASTATIN CALCIUM 80 MG PO TABS
80.0000 mg | ORAL_TABLET | Freq: Every day | ORAL | Status: DC
  Administered 2015-06-12 – 2015-06-14 (×3): 80 mg via ORAL
  Filled 2015-06-12 (×3): qty 1

## 2015-06-12 MED ORDER — SODIUM CHLORIDE 0.9 % IJ SOLN: 3.0000 mL | INTRAMUSCULAR | Status: DC | PRN

## 2015-06-12 MED ORDER — MECLIZINE HCL 25 MG PO TABS: 25.0000 mg | ORAL_TABLET | Freq: Two times a day (BID) | ORAL | Status: DC | PRN

## 2015-06-12 MED ORDER — INSULIN ASPART 100 UNIT/ML ~~LOC~~ SOLN
0.0000 [IU] | Freq: Three times a day (TID) | SUBCUTANEOUS | Status: DC
  Administered 2015-06-12: 2 [IU] via SUBCUTANEOUS
  Administered 2015-06-13 (×2): 3 [IU] via SUBCUTANEOUS
  Administered 2015-06-13: 5 [IU] via SUBCUTANEOUS
  Administered 2015-06-14 – 2015-06-15 (×3): 3 [IU] via SUBCUTANEOUS

## 2015-06-12 MED ORDER — ASPIRIN 81 MG PO CHEW
243.0000 mg | CHEWABLE_TABLET | Freq: Once | ORAL | Status: AC
  Administered 2015-06-12: 243 mg via ORAL
  Filled 2015-06-12: qty 3

## 2015-06-12 MED ORDER — HEPARIN (PORCINE) IN NACL 100-0.45 UNIT/ML-% IJ SOLN
1300.0000 [IU]/h | INTRAMUSCULAR | Status: DC
  Administered 2015-06-12: 900 [IU]/h via INTRAVENOUS
  Administered 2015-06-13: 1300 [IU]/h via INTRAVENOUS
  Filled 2015-06-12 (×2): qty 250

## 2015-06-12 MED ORDER — CARVEDILOL 3.125 MG PO TABS
3.1250 mg | ORAL_TABLET | Freq: Two times a day (BID) | ORAL | Status: DC
  Administered 2015-06-12 – 2015-06-15 (×5): 3.125 mg via ORAL
  Filled 2015-06-12 (×5): qty 1

## 2015-06-12 MED ORDER — FUROSEMIDE 10 MG/ML IJ SOLN
40.0000 mg | Freq: Once | INTRAMUSCULAR | Status: AC
  Administered 2015-06-12: 40 mg via INTRAVENOUS
  Filled 2015-06-12: qty 4

## 2015-06-12 MED ORDER — IPRATROPIUM-ALBUTEROL 0.5-2.5 (3) MG/3ML IN SOLN
3.0000 mL | Freq: Once | RESPIRATORY_TRACT | Status: AC
  Administered 2015-06-12: 3 mL via RESPIRATORY_TRACT
  Filled 2015-06-12: qty 3

## 2015-06-12 NOTE — ED Notes (Signed)
Patient ambulatory with steady gait to restroom and back. Patient reports worsened shortness of breath after ambulating.

## 2015-06-12 NOTE — ED Provider Notes (Signed)
CSN: [id number]     Arrival date & time 06/12/15  7654 History   First MD Initiated Contact with Patient 06/12/15 [phone]     Chief Complaint  Patient presents with  . Shortness of Breath     (Consider location/radiation/quality/duration/timing/severity/associated sxs/prior Treatment) Patient is a 54 y.o. female presenting with shortness of breath. The history is provided by the patient.  Shortness of Breath She woke up this morning with severe shortness of breath. It has been a slight cough and she did produce sputum on one occasion. She denies chest pain but has had some tightness in her chest. There is no nausea or vomiting. She did have some diaphoresis. She states that she has episodes with some degree of regularity but usually can control her breathing. Today she was unable to. She also states he had a similar episode that was almost a severe yesterday. She has been having these episodes with increasing frequency over the last 2 weeks. During that time, she has noticed that she has difficulty breathing outside in the cold air. Breathing is definitely worse when she lies supine. She does have a history of having had a heart attack and several stents placed earlier this year. Current symptoms are different from what she had with her heart attack.  Past Medical History  Diagnosis Date  . Fibromyalgia  Dx 2004  . Tobacco abuse     At 54 years old  . Hypothyroid Dx 2004  . Family history of adverse reaction to anesthesia     "mother died twice during surgery; they brought her back both times" (11/15/2014)  . Sleep apnea     "dx'd; don't wear a mask" (11/15/2014)  . History of stomach ulcers 1981  . Migraine     "couple/month" (11/15/2014)  . Headache     "@ least weekly" (11/15/2014)  . Arthritis     "hips, ankles, knees, hands, back" (11/15/2014)  . Chronic back pain     "all over"  . Anxiety     "social"  . Chronic depression     At age of 54years old  . Kidney stones     "I've had  several"  . Poorly controlled type 2 diabetes mellitus with circulatory disorder ([place]) 11/15/2014  . Essential hypertension 11/15/2014  . Hyperlipidemia associated with type 2 diabetes mellitus ([place]) 11/15/2014  . NSTEMI (non-ST elevated myocardial infarction) ([place]) 11/15/2014  . CAD S/P percutaneous coronary angioplasty 5/18/'16 ; 6/3/'16    a) NSTEMI: m-d LAD 90% -->0% Post PCI - 2 overlap DES ( Promus Premier DES 3.0 x 16 - 2.75  x 16), m-dCx 95%--> 0% post DES PCI (Synergy DES 2.25 x 38 -mini-crushed @ Ostial OM1 , Ostial-prox OM1 90% -> 0% post DES PCI (Synergy DES 2.75 x 24 -> 3.0 mm) - bifurcation minicrush of mCx stent;; b) UA/ACS: mLAD upstream - 3rd overlapping Promus DES 3.0 x 20 (3.25 mm)   . Coronary artery chronic total occlusion: 100% RCA CTO 11/16/2014    Prox RCA to Mid RCA lesion, 100% stenosed.   . Type IVa MI - Peri-PCI 11/17/2014    Prolonged ischemia during bifurcation PCI of  m-d Cx-OM1 with minicrush technique   Past Surgical History  Procedure Laterality Date  . Cesarean section  2000  . Inner ear surgery Bilateral "several"  . Tonsillectomy    . Total abdominal hysterectomy  ~ 2008  . Tubal ligation  2000  . Cardiac catheterization N/A 11/16/2014    Procedure: Left Heart  Cath and Coronary Angiography;  Surgeon: Peter M Martinique, MD;  Location: [facility];  Service: Cardiovascular;  RCA 100% CTO, m-dLAD 90%, m-dCx 95%, OM1 90%  . Cardiac catheterization  11/16/2014    Procedure: Coronary Stent Intervention;  Surgeon: Peter M Martinique, MD;  Location: [facility];  Service: Cardiovascular;;m-d LAD overlapping 3.0 x 16 & 2.75 x 16 Promus P DES; OM1-dCx Minicrush bifurcation (dCx 2.25 x 38 Synergy DES crushed with 2.75 x 24 Synergy DES in OM1)   . Cardiac catheterization N/A 12/02/2014    Procedure: Left Heart Cath and Coronary Angiography;  Surgeon: Leonie Man, MD;  Location: [facility];  Service: Cardiovascular;  Previous Stents in LAD & Cx-OM patent, mLAD  prox to stents 99%  . Cardiac catheterization N/A 12/02/2014    Procedure: Coronary Stent Intervention;  Surgeon: Leonie Man, MD;  Location: [facility];  Service: Cardiovascular;  3rd overlapping (prox) mLAD Promus Premier DES 3.0 x 20 (3.25 mm)  . Transthoracic echocardiogram  5/19 & 6/ 2016    a) EF 65-70%, mild LVH, Gr 1 DD; b) EF 60% -ron RWMA, no valve lesions   Family History  Problem Relation Age of Onset  . Hyperlipidemia Mother   . Sudden death Father   . Emphysema Maternal Grandmother     smoked   Social History  Substance Use Topics  . Smoking status: Former Smoker -- 1.50 packs/day for 39 years    Types: Cigarettes  . Smokeless tobacco: Never Used  . Alcohol Use: No     Comment: 11/15/2014 "might have a few drinks/yr"   OB History    No data available     Review of Systems  Respiratory: Positive for shortness of breath.   All other systems reviewed and are negative.     Allergies  Corticosteroids and Penicillins  Home Medications   Prior to Admission medications   Medication Sig Start Date End Date Taking? Authorizing Provider  aspirin EC 81 MG EC tablet Take 1 tablet (81 mg total) by mouth daily. 11/18/14   Debbe Odea, MD  atorvastatin (LIPITOR) 80 MG tablet Take 1 tablet (80 mg total) by mouth daily at 6 PM. 12/14/14   Arnoldo Morale, MD  carvedilol (COREG) 3.125 MG tablet Take 1 tablet (3.125 mg total) by mouth 2 (two) times daily with a meal. 03/20/15   Josalyn Funches, MD  carvedilol (COREG) 3.125 MG tablet TAKE 1 TABLET BY MOUTH 2 TIMES DAILY WITH A MEAL. 06/06/15   Boykin Nearing, MD  clopidogrel (PLAVIX) 75 MG tablet Take 1 tablet (75 mg total) by mouth daily. 01/05/15   Boykin Nearing, MD  Elastic Bandages & Supports (MEDICAL COMPRESSION STOCKINGS) MISC Wear daily 30 mm Hg pressure 03/20/15   Josalyn Funches, MD  furosemide (LASIX) 20 MG tablet TAKE 1/2 TABLET EVERY OTHER DAY OR AS NEEDED Patient not taking: Reported on 04/20/2015 03/23/15    Leonie Man, MD  glucose monitoring kit (FREESTYLE) monitoring kit 1 each by Does not apply route 4 (four) times daily - after meals and at bedtime. 1 month Diabetic Testing Supplies for QAC-QHS accuchecks. 11/16/14   Debbe Odea, MD  ibuprofen (ADVIL,MOTRIN) 200 MG tablet Take 800 mg by mouth every 8 (eight) hours as needed for mild pain or moderate pain (Back pain).     Historical Provider, MD  Insulin Detemir (LEVEMIR FLEXPEN) 100 UNIT/ML Pen Inject 35 Units into the skin at bedtime. 03/30/15   Josalyn Funches,  MD  Insulin Syringe-Needle U-100 (TRUEPLUS INSULIN SYRINGE) 30G X 5/16" 0.5 ML MISC 1 each by Does not apply route 4 (four) times daily. 03/02/15   Josalyn Funches, MD  isosorbide mononitrate (IMDUR) 30 MG 24 hr tablet Take 1 tablet (30 mg total) by mouth daily. 12/22/14   Leonie Man, MD  levothyroxine (SYNTHROID, LEVOTHROID) 175 MCG tablet Take 1 tablet (175 mcg total) by mouth daily before breakfast. 01/25/15   Tresa Garter, MD  meclizine (ANTIVERT) 25 MG tablet Take 1 tablet (25 mg total) by mouth 2 (two) times daily as needed for dizziness. 12/22/14   Leonie Man, MD  metFORMIN (GLUCOPHAGE) 500 MG tablet Take 1 tablet (500 mg total) by mouth 2 (two) times daily with a meal. 03/20/15   Josalyn Funches, MD  metFORMIN (GLUCOPHAGE) 500 MG tablet TAKE 1 TABLET BY MOUTH 2 TIMES DAILY WITH A MEAL. 06/06/15   Josalyn Funches, MD  nitroGLYCERIN (NITROSTAT) 0.4 MG SL tablet Place 0.4 mg under the tongue every 5 (five) minutes as needed for chest pain (may take three doses by mouth per day as needed).    Historical Provider, MD  olmesartan (BENICAR) 20 MG tablet Take 1 tablet (20 mg total) by mouth daily. 03/24/15   Tanda Rockers, MD  oxyCODONE-acetaminophen (PERCOCET/ROXICET) 5-325 MG tablet Take 1-2 tablets by mouth every 4 (four) hours as needed for severe pain. 04/21/15   Costin Karlyne Greenspan, MD   BP 165/95 mmHg  Pulse 74  Temp(Src) 99.2 F (37.3 C) (Oral)  Resp 22  SpO2  95% Physical Exam  Nursing note and vitals reviewed.  54 year old female, resting comfortably and in no acute distress. Vital signs are significant for hypertension and tachypnea. Oxygen saturation is 95%, which is normal. She is only able to speak about 3-4 words on a single breath. Voice is somewhat hoarse. Head is normocephalic and atraumatic. PERRLA, EOMI. Oropharynx is clear. Neck is nontender and supple without adenopathy or JVD. Back is nontender and there is no CVA tenderness. Lungs have slightly prolonged exhalation phase without overt rales, wheezes, or rhonchi. Chest is nontender. Heart has regular rate and rhythm without murmur. Abdomen is soft, flat, nontender without masses or hepatosplenomegaly and peristalsis is normoactive. Extremities have no cyanosis or edema, full range of motion is present. Skin is warm and dry without rash. Neurologic: Mental status is normal, cranial nerves are intact, there are no motor or sensory deficits.  ED Course  Procedures (including critical care time) Labs Review Results for orders placed or performed during the hospital encounter of 93/73/42  Basic metabolic panel  Result Value Ref Range   Sodium 140 135 - 145 mmol/L   Potassium 3.4 (L) 3.5 - 5.1 mmol/L   Chloride 107 101 - 111 mmol/L   CO2 25 22 - 32 mmol/L   Glucose, Bld 184 (H) 65 - 99 mg/dL   BUN <5 (L) 6 - 20 mg/dL   Creatinine, Ser 0.56 0.44 - 1.00 mg/dL   Calcium 8.6 (L) 8.9 - 10.3 mg/dL   GFR calc non Af Amer >60 >60 mL/min   GFR calc Af Amer >60 >60 mL/min   Anion gap 8 5 - 15  Troponin I  Result Value Ref Range   Troponin I 0.16 (H) <0.031 ng/mL  Brain natriuretic peptide  Result Value Ref Range   B Natriuretic Peptide 609.2 (H) 0.0 - 100.0 pg/mL  CBC with Differential  Result Value Ref Range   WBC 7.8 4.0 -  10.5 K/uL   RBC 4.32 3.87 - 5.11 MIL/uL   Hemoglobin 12.7 12.0 - 15.0 g/dL   HCT 38.3 36.0 - 46.0 %   MCV 88.7 78.0 - 100.0 fL   MCH 29.4 26.0 - 34.0 pg    MCHC 33.2 30.0 - 36.0 g/dL   RDW 15.0 11.5 - 15.5 %   Platelets 153 150 - 400 K/uL   Neutrophils Relative % 75 %   Neutro Abs 5.8 1.7 - 7.7 K/uL   Lymphocytes Relative 19 %   Lymphs Abs 1.5 0.7 - 4.0 K/uL   Monocytes Relative 5 %   Monocytes Absolute 0.4 0.1 - 1.0 K/uL   Eosinophils Relative 0 %   Eosinophils Absolute 0.0 0.0 - 0.7 K/uL   Basophils Relative 1 %   Basophils Absolute 0.0 0.0 - 0.1 K/uL   Imaging Review Dg Chest Port 1 View  06/12/2015  CLINICAL DATA:  Acute onset of shortness of breath. Initial encounter. EXAM: PORTABLE CHEST 1 VIEW COMPARISON:  Chest radiograph and CTA of the chest performed 04/20/2015 FINDINGS: The lungs are well-aerated. Vascular congestion is noted, with increased interstitial markings, concerning for pulmonary edema. There is no evidence of pleural effusion or pneumothorax. The cardiomediastinal silhouette is mildly enlarged. No acute osseous abnormalities are seen. IMPRESSION: Vascular congestion and mild cardiomegaly, with increased interstitial markings, concerning for pulmonary edema. Electronically Signed   By: Garald Balding M.D.   On: 06/12/2015 06:36   I have personally reviewed and evaluated these images and lab results as part of my medical decision-making.   EKG Interpretation   Date/Time:  Monday June 12 2015 06:12:43 EST Ventricular Rate:  68 PR Interval:  167 QRS Duration: 101 QT Interval:  429 QTC Calculation: 456 R Axis:   -6 Text Interpretation:  Sinus rhythm Low voltage, precordial leads Baseline  wander in lead(s) I II aVR aVL V1 V2 V5 V6 When compared with ECG of  04/20/2015, HEART RATE has decreased Confirmed by [facility]  MD, Dacian Orrico ([id number])  on 06/12/2015 6:18:08 AM      CRITICAL CARE Performed by: YHCWC,BJSEG Total critical care time: 35 minutes Critical care time was exclusive of separately billable procedures and treating other patients. Critical care was necessary to treat or prevent imminent or life-threatening  deterioration. Critical care was time spent personally by me on the following activities: development of treatment plan with patient and/or surrogate as well as nursing, discussions with consultants, evaluation of patient's response to treatment, examination of patient, obtaining history from patient or surrogate, ordering and performing treatments and interventions, ordering and review of laboratory studies, ordering and review of radiographic studies, pulse oximetry and re-evaluation of patient's condition.  MDM   Final diagnoses:  Acute coronary syndrome (HCC)  Acute on chronic congestive heart failure, unspecified congestive heart failure type ([place])    Episode of dyspnea. History is worrisome forparoxysmal nocturnal dyspnea, will consider possible angina equivalent. ECG shows no acute changes. Old records are reviewed and she did have 3 vessel coronary disease noted on catheterization last May following non-STEMI. One artery was completely occluded chronically and the other 2 arteries were treated with stents. She does have suggestion of some bronchospasm with prolonged exhalation phase. Will give a trial of albuterol with ipratropium. Chest x-ray or be obtained as well as screening labs including BNP.  Chest x-ray appears to show mild pulmonary edema. There was no clinical response to albuterol with ipratropium. A dose of furosemide is given.  Troponin is come back mildly elevated  at 0.16 and BNP is significantly elevated at 609. Heparin is started for possible acute coronary syndrome and patient will need to be admitted. As of 8:00 AM, I have not received calls from hospitalist or cardiologist. Case is signed out to Dr. Eulis Foster.  Delora Fuel, MD 99/27/80 [id number]

## 2015-06-12 NOTE — ED Notes (Signed)
Toilet at bedside. Pt had 1600 ML output.

## 2015-06-12 NOTE — Progress Notes (Signed)
[facility] for heparin Indication: ACS/STEMI  Allergies  Allergen Reactions  . Corticosteroids Anaphylaxis    ALL steroids.     . Penicillins Anaphylaxis    Has patient had a PCN reaction causing immediate rash, facial/tongue/throat swelling, SOB or lightheadedness with hypotension: {Yes Has patient had a PCN reaction causing severe rash involving mucus membranes or skin necrosis: NO Has patient had a PCN reaction that required hospitalization NO Has patient had a PCN reaction occurring within the last 10 years: {Yes If all of the above answers are "NO", then may proceed with Cephalosporin use.     Patient Measurements:  Height: 5\' 2"  (157.5 cm) Weight: 206 lb 1.6 oz (93.486 kg) IBW/kg (Calculated) : 50.1 Heparin Dosing Weight: 70.1 kg  Vital Signs: Temp: 98.3 F (36.8 C) (12/12 1328) Temp Source: Oral (12/12 1328) BP: 142/76 mmHg (12/12 1328) Pulse Rate: 58 (12/12 1328)  Labs:  Recent Labs  06/12/15 0642 06/12/15 1505  HGB 12.7  --   HCT 38.3  --   PLT 153  --   HEPARINUNFRC  --  <0.10*  CREATININE 0.56  --   TROPONINI 0.16*  --     Estimated Creatinine Clearance: 85.7 mL/min (by C-G formula based on Cr of 0.56).  Assessment: 54 YO female w/ hx of MI, CAD s/p stent placement in May and again in June 2016 who presents with chest tightness with SOB. Recently admitted in October w/ chest pain thought to be d/t unstable angina. Trop elevated to 0.16.   Started on IV heparin for ACS. First level undetectable- no issues with line per RN.  Hgb 12.7, plts 153- no bleeding noted.  Goal of Therapy:  Heparin level 0.3-0.7 units/ml Monitor platelets by anticoagulation protocol: Yes   Plan:  -rebolus with heparin 2000 units IV x1, then increase rate to 1050 units/hr -heparin level in 6h -daily HL and CBC -follow s/s bleeding and cardiology plans  Kamillah Didonato D. Rohit Deloria, PharmD, BCPS Clinical Pharmacist Pager: [phone] 06/12/2015  4:23 PM

## 2015-06-12 NOTE — ED Notes (Signed)
Per EMS - pt hx vocal cord nodes. Pt being seen at [facility] and supposed to have laser surgery to remove this year. Pt feels short of breath and voice hoarse at times from vocal cord nodes. Pt c/o some chest tightness w/ breathing - hx MI, states these symptoms are not like that pain. Pt woke up feeling short of breath this morning, symptoms didn't decrease and was concerned. Lung sounds clear. 2L [place] for comfort. Pt ambulatory w/o distress. 91-92% RA, 95% on 2L [place]

## 2015-06-12 NOTE — ED Provider Notes (Signed)
08:10- patient seen for assistance with admission, at the request of Dr. Roxanne Mins. She was seen by him earlier, consultants were paged but have not returned the call yet. At this time. Patient states that her chest tightness, which was present for 1 hour earlier today has entirely resolved. She does not have pain at this time, which feels like her prior heart attack. She does have chronic left lower anterior chest pain, for which she takes 3 Percocet tablets each day. She usually takes aspirin in the morning. She has been troubled by shortness of breath for 2 months, and this is worsened in the last 3 days. She does not smoke cigarettes currently. She has a mild non-productive cough, which started within the last day. She does not use oxygen or nebulizers at home.  Exam- alert, calm, cooperative. Heart regular rate and rhythm, no murmur. Lungs decreased air movement bilaterally with generalized rhonchi, no wheezes or rales. There is no respiratory distress. Legs with peripheral edema, 1+ bilateral lower.  Cardiology consultation- call returned at 09:00- will see in ED for NSTEMI.    Daleen Bo, MD 06/12/15 0900

## 2015-06-12 NOTE — ED Notes (Addendum)
Pt CBG, 134.

## 2015-06-12 NOTE — Progress Notes (Signed)
Troponin increased to 0.53. Continue cycle troponin and IV heparin. Further management per primary team tomorrow for possible cath. NPO after midnight.    Kern Gingras, [place]

## 2015-06-12 NOTE — H&P (Signed)
Patient ID: Jacqueline Munoz MRN: [id number], DOB/AGE: 03/07/1961   Admit date: 06/12/2015   Primary Physician: [name], MD Primary Cardiologist: Dr. Ellyn Hack Reason for admission: SOB.   Pt. Profile:  Jacqueline Munoz is a 54 y.o. female with a history of CAD: NSTEMI s/p DES x2 LAD and DESx1 to bifurcation of OM1 and mid to distal LCx (11/16/14); [place] s/p overlapping DES x1 to LAD for de novo 99% focal lesion in LAD (12/02/2014); know CTO of RCA, HLD, HTN, DMT2, previous tobacco abuse, OSA not on CPAP, hypothyroidism, pulmonary nodules and fibromyalgia who presented to [facility] today with SOB and found to have NSTEMI/CHF.  She was first admitted in 10/2014 for chest pain and found to have NSTEMI (troponin max 1.84) Underwent cardiac cath 5/18 and noted to have severe 3 vessel obstructive coronary artery disease w/ stenting of the mid LAD done with overlapping DES x2 as well as stenting of bifurcation of first OM and mid to distal left circumflex with DES x1. She has an chronic totally occluded RCA. She presented back to the hospital 11/2015 with unstable angina and underwent repeat catheterization. This revealed patent stents and a de novo 99% focal lesion in LAD proximal to the previously placed overlapping stents, and a third overlapping DES stent was placed. 2D ECHO with normal LV function EF 60% and RWMAs. Previous ECHO in 10/2014 with G1DD.   She has been followed by Dr. Ellyn Hack as an outpatient and was converted from Brilinta to Plavix last visit for financial concerns. Of note, CT Chest showed multiple calcified and noncalcified nodules in right lung. Largest is 6 mm. Likely represent calcified or noncalcified granuloma. Recommend followup chest CT in 6-12 months.  Over the past couple days she has noticed worsening SOB, orthopnea and PND which is new for her. No LE edema or abdominal distention. She woke up this morning and felt like she just could not get her breath and decided to be seen  in the emergency room. She has lasix at home to use as needed for LE edema but she really has not needed to use this. According to patient this is not at all like her previous heart attack symptomatology.   In the ER she was noted to have pulmonary vascular congestion and CXR and mildly elevated BNP.  She was given 72m IV lasix with symptomatic improvement. Electrolyte stable and ECG non ischemic.    Problem List  Past Medical History  Diagnosis Date  . Fibromyalgia  Dx 2004  . Tobacco abuse     At 54years old  . Hypothyroid Dx 2004  . Family history of adverse reaction to anesthesia     "mother died twice during surgery; they brought her back both times" (11/15/2014)  . Sleep apnea     "dx'd; don't wear a mask" (11/15/2014)  . History of stomach ulcers 1981  . Migraine     "couple/month" (11/15/2014)  . Headache     "@ least weekly" (11/15/2014)  . Arthritis     "hips, ankles, knees, hands, back" (11/15/2014)  . Chronic back pain     "all over"  . Anxiety     "social"  . Chronic depression     At age of 89ears old  . Kidney stones     "I've had several"  . Poorly controlled type 2 diabetes mellitus with circulatory disorder ([place] 11/15/2014  . Essential hypertension 11/15/2014  . Hyperlipidemia associated with type 2 diabetes mellitus ([place] 11/15/2014  .  NSTEMI (non-ST elevated myocardial infarction) ([place]) 11/15/2014  . CAD S/P percutaneous coronary angioplasty 5/18/'16 ; 6/3/'16    a) NSTEMI: m-d LAD 90% -->0% Post PCI - 2 overlap DES ( Promus Premier DES 3.0 x 16 - 2.75  x 16), m-dCx 95%--> 0% post DES PCI (Synergy DES 2.25 x 38 -mini-crushed @ Ostial OM1 , Ostial-prox OM1 90% -> 0% post DES PCI (Synergy DES 2.75 x 24 -> 3.0 mm) - bifurcation minicrush of mCx stent;; b) UA/ACS: mLAD upstream - 3rd overlapping Promus DES 3.0 x 20 (3.25 mm)   . Coronary artery chronic total occlusion: 100% RCA CTO 11/16/2014    Prox RCA to Mid RCA lesion, 100% stenosed.   . Type IVa MI - Peri-PCI  11/17/2014    Prolonged ischemia during bifurcation PCI of  m-d Cx-OM1 with minicrush technique    Past Surgical History  Procedure Laterality Date  . Cesarean section  2000  . Inner ear surgery Bilateral "several"  . Tonsillectomy    . Total abdominal hysterectomy  ~ 2008  . Tubal ligation  2000  . Cardiac catheterization N/A 11/16/2014    Procedure: Left Heart Cath and Coronary Angiography;  Surgeon: Peter M Martinique, MD;  Location: [place] [facility];  Service: Cardiovascular;  RCA 100% CTO, m-dLAD 90%, m-dCx 95%, OM1 90%  . Cardiac catheterization  11/16/2014    Procedure: Coronary Stent Intervention;  Surgeon: Peter M Martinique, MD;  Location: [place] [facility];  Service: Cardiovascular;;m-d LAD overlapping 3.0 x 16 & 2.75 x 16 Promus P DES; OM1-dCx Minicrush bifurcation (dCx 2.25 x 38 Synergy DES crushed with 2.75 x 24 Synergy DES in OM1)   . Cardiac catheterization N/A 12/02/2014    Procedure: Left Heart Cath and Coronary Angiography;  Surgeon: Leonie Man, MD;  Location: [place] [facility];  Service: Cardiovascular;  Previous Stents in LAD & Cx-OM patent, mLAD prox to stents 99%  . Cardiac catheterization N/A 12/02/2014    Procedure: Coronary Stent Intervention;  Surgeon: Leonie Man, MD;  Location: [place] [facility];  Service: Cardiovascular;  3rd overlapping (prox) mLAD Promus Premier DES 3.0 x 20 (3.25 mm)  . Transthoracic echocardiogram  5/19 & 6/ 2016    a) EF 65-70%, mild LVH, Gr 1 DD; b) EF 60% -ron RWMA, no valve lesions     Allergies  Allergies  Allergen Reactions  . Corticosteroids Anaphylaxis    ALL steroids.     . Penicillins Anaphylaxis    Has patient had a PCN reaction causing immediate rash, facial/tongue/throat swelling, SOB or lightheadedness with hypotension: {Yes Has patient had a PCN reaction causing severe rash involving mucus membranes or skin necrosis: NO Has patient had a PCN reaction that required hospitalization NO Has patient had a PCN reaction  occurring within the last 10 years: {Yes If all of the above answers are "NO", then may proceed with Cephalosporin use.      Home Medications  Prior to Admission medications   Medication Sig Start Date End Date Taking? Authorizing Provider  aspirin EC 81 MG EC tablet Take 1 tablet (81 mg total) by mouth daily. 11/18/14  Yes Debbe Odea, MD  atorvastatin (LIPITOR) 80 MG tablet Take 1 tablet (80 mg total) by mouth daily at 6 PM. 12/14/14  Yes Arnoldo Morale, MD  carvedilol (COREG) 3.125 MG tablet Take 1 tablet (3.125 mg total) by mouth 2 (two) times daily with a meal. 03/20/15  Yes Josalyn Funches, MD  clopidogrel (PLAVIX) 75 MG  tablet Take 1 tablet (75 mg total) by mouth daily. 01/05/15  Yes Josalyn Funches, MD  furosemide (LASIX) 20 MG tablet TAKE 1/2 TABLET EVERY OTHER DAY OR AS NEEDED Patient taking differently: Take 10 mg by mouth See admin instructions. TAKE 1/2 TABLET EVERY OTHER DAY OR AS NEEDED FOR FLUID 03/23/15  Yes Leonie Man, MD  glucose monitoring kit (FREESTYLE) monitoring kit 1 each by Does not apply route 4 (four) times daily - after meals and at bedtime. 1 month Diabetic Testing Supplies for QAC-QHS accuchecks. 11/16/14  Yes Debbe Odea, MD  ibuprofen (ADVIL,MOTRIN) 200 MG tablet Take 800 mg by mouth every 8 (eight) hours as needed for mild pain or moderate pain (Back pain).    Yes Historical Provider, MD  Insulin Detemir (LEVEMIR FLEXPEN) 100 UNIT/ML Pen Inject 35 Units into the skin at bedtime. 03/30/15  Yes Josalyn Funches, MD  Insulin Syringe-Needle U-100 (TRUEPLUS INSULIN SYRINGE) 30G X 5/16" 0.5 ML MISC 1 each by Does not apply route 4 (four) times daily. 03/02/15  Yes Josalyn Funches, MD  isosorbide mononitrate (IMDUR) 30 MG 24 hr tablet Take 1 tablet (30 mg total) by mouth daily. 12/22/14  Yes Leonie Man, MD  levothyroxine (SYNTHROID, LEVOTHROID) 175 MCG tablet Take 1 tablet (175 mcg total) by mouth daily before breakfast. 01/25/15  Yes Tresa Garter, MD  lisinopril  (PRINIVIL,ZESTRIL) 10 MG tablet Take 10 mg by mouth daily.   Yes Historical Provider, MD  meclizine (ANTIVERT) 25 MG tablet Take 1 tablet (25 mg total) by mouth 2 (two) times daily as needed for dizziness. 12/22/14  Yes Leonie Man, MD  metFORMIN (GLUCOPHAGE) 500 MG tablet Take 1 tablet (500 mg total) by mouth 2 (two) times daily with a meal. 03/20/15  Yes Josalyn Funches, MD  nitroGLYCERIN (NITROSTAT) 0.4 MG SL tablet Place 0.4 mg under the tongue every 5 (five) minutes as needed for chest pain (may take three doses by mouth per day as needed).   Yes Historical Provider, MD  oxyCODONE-acetaminophen (PERCOCET/ROXICET) 5-325 MG tablet Take 1-2 tablets by mouth every 4 (four) hours as needed for severe pain. 04/21/15  Yes Costin Karlyne Greenspan, MD  Elastic Bandages & Supports (MEDICAL COMPRESSION STOCKINGS) MISC Wear daily 30 mm Hg pressure 03/20/15   Boykin Nearing, MD    Family History  Family History  Problem Relation Age of Onset  . Hyperlipidemia Mother   . Sudden death Father   . Emphysema Maternal Grandmother     smoked   Family Status  Relation Status Death Age  . Mother Alive   . Father Deceased      Social History  Social History   Social History  . Marital Status: Widowed    Spouse Name: N/A  . Number of Children: N/A  . Years of Education: N/A   Occupational History  . Not on file.   Social History Main Topics  . Smoking status: Former Smoker -- 1.50 packs/day for 39 years    Types: Cigarettes  . Smokeless tobacco: Never Used  . Alcohol Use: No     Comment: 11/15/2014 "might have a few drinks/yr"  . Drug Use: Yes    Special: Marijuana     Comment: last marijuana use 03/18/2015  . Sexual Activity: Not on file   Other Topics Concern  . Not on file   Social History Narrative     All other systems reviewed and are otherwise negative except as noted above.  Physical Exam  Blood pressure  144/89, pulse 57, temperature 99.2 F (37.3 C), temperature source  Oral, resp. rate 19, height 5' 1.81" (1.57 m), weight 192 lb 14.4 oz (87.5 kg), SpO2 96 %.  General: Pleasant, NAD. obese Psych: Normal affect. Neuro: Alert and oriented X 3. Moves all extremities spontaneously. HEENT: Normal  Neck: Supple without bruits. +JVD. Lungs:  Resp regular and unlabored, decreased breath sounds at bases Heart: RRR no s3, s4, or murmurs. Abdomen: Soft, non-tender, non-distended, BS + x 4.  Extremities: No clubbing, cyanosis or edema. DP/PT/Radials 2+ and equal bilaterally.  Labs   Recent Labs  06/12/15 0642  TROPONINI 0.16*   Lab Results  Component Value Date   WBC 7.8 06/12/2015   HGB 12.7 06/12/2015   HCT 38.3 06/12/2015   MCV 88.7 06/12/2015   PLT 153 06/12/2015    Recent Labs Lab 06/12/15 0642  NA 140  K 3.4*  CL 107  CO2 25  BUN <5*  CREATININE 0.56  CALCIUM 8.6*  GLUCOSE 184*   Lab Results  Component Value Date   CHOL 135 04/21/2015   HDL 25* 04/21/2015   LDLCALC 41 04/21/2015   TRIG 346* 04/21/2015   No results found for: DDIMER   Radiology/Studies  Dg Chest Port 1 View  06/12/2015  CLINICAL DATA:  Acute onset of shortness of breath. Initial encounter. EXAM: PORTABLE CHEST 1 VIEW COMPARISON:  Chest radiograph and CTA of the chest performed 04/20/2015 FINDINGS: The lungs are well-aerated. Vascular congestion is noted, with increased interstitial markings, concerning for pulmonary edema. There is no evidence of pleural effusion or pneumothorax. The cardiomediastinal silhouette is mildly enlarged. No acute osseous abnormalities are seen. IMPRESSION: Vascular congestion and mild cardiomegaly, with increased interstitial markings, concerning for pulmonary edema. Electronically Signed   By: Garald Balding M.D.   On: 06/12/2015 06:36    2D ECHO: 12/02/2014 LV EF: 60% Study Conclusions - Left ventricle: The cavity size was normal. Wall thickness was normal. The estimated ejection fraction was 60%. Wall motion was normal; there  were no regional wall motion abnormalities. - Right ventricle: The cavity size was normal. Systolic function was normal.   ECG  ECG with NSR. HR 64.   ASSESSMENT AND PLAN  Jacqueline Munoz is a 54 y.o. female with a history of CAD: NSTEMI s/p DES x2 LAD and DESx1 to bifurcation of OM1 and mid to distal LCx (11/16/14); [place] s/p overlapping DES x1 to LAD for de novo 99% focal lesion in LAD (12/02/2014); know CTO of RCA, HLD, HTN, DMT2, previous tobacco abuse, OSA not on CPAP, hypothyroidism, pulmonary nodules and fibromyalgia who presented to [facility] today with SOB and found to have NSTEMI/CHF.   CAD/Elevated troponin: first troponin normal. Second returned elevated at 0.16. She has had mild chest tightness but sx not reminiscent of previous cardiac angina. Likely demand in the setting acute CHF.  ECG with no acute ST or TW changes -- She was placed on IV heparin. Okay to continue this for now. If she has a jump in troponin she may need cath, but no indication for cardiac catheterization right now. We will continued to cycle her cardiac markers. If remains low and flat we can discontinue IV heparin and start DVT prophylaxis.  -- Continue ASA, statin and BB.  Acute on chronic presumed diastolic CHF (new onset CHF): BNP midlly elevated and CXR with pulm vasc congestion. Patient with s/s CHF. -- Will repeat a 2D ECHO to assess LVEF.  -- Continue IV lasix 55m BID. Watch creat and  potassium -- Strict I/Os and daily weights -- Low sodium diet   HTN: BP well controlled. Continue coreg 3.188m BID, lisinopril 137mpo qd and imdur 3047mo qd  DM: will start SSI  Hypokalemia- mildly low but will supplement in the setting of IV diuresis.  Pulmonary nodules: CT Chest (04/20/15) showed multiple calcified and noncalcified nodules in right lung. Largest is 6 mm. Likely represent calcified or noncalcified granuloma. Recommend followup chest CT in 9-12 months. Due to get repeat in July 2017.     Signed, THOEileen StanfordA-C 06/12/2015, 10:08 AM  Pager [phone] have examined the patient and reviewed assessment and plan and discussed with patient.  Agree with above as stated.  Sx today are different than her prior angina.  More SHOB today.  Will diurese and see if she is better with that.  If so and can walk around without any sx, would not pursue cath.  She is hoping to avoid cath.  If persistent sx, CP or significant increase in enzymes, then would have to reconsider.  WIll make NPO past MN.   Hollan Philipp S.

## 2015-06-12 NOTE — Progress Notes (Signed)
ANTICOAGULATION CONSULT NOTE - Initial Consult  Pharmacy Consult for heparin Indication: ACS/STEMI  Allergies  Allergen Reactions  . Corticosteroids Anaphylaxis    ALL steroids.     . Penicillins Anaphylaxis    Has patient had a PCN reaction causing immediate rash, facial/tongue/throat swelling, SOB or lightheadedness with hypotension: {Yes Has patient had a PCN reaction causing severe rash involving mucus membranes or skin necrosis: NO Has patient had a PCN reaction that required hospitalization NO Has patient had a PCN reaction occurring within the last 10 years: {Yes If all of the above answers are "NO", then may proceed with Cephalosporin use.     Patient Measurements: Wt 87.5 kg   Heparin Dosing Weight: 70.1 kg  Vital Signs: Temp: 99.2 F (37.3 C) (12/12 0607) Temp Source: Oral (12/12 0607) BP: 165/81 mmHg (12/12 0700) Pulse Rate: 60 (12/12 0700)  Labs:  Recent Labs  06/12/15 0642  HGB 12.7  HCT 38.3  PLT 153  CREATININE 0.56  TROPONINI 0.16*    CrCl cannot be calculated (Unknown ideal weight.).   Medical History: Past Medical History  Diagnosis Date  . Fibromyalgia  Dx 2004  . Tobacco abuse     At 54 years old  . Hypothyroid Dx 2004  . Family history of adverse reaction to anesthesia     "mother died twice during surgery; they brought her back both times" (11/15/2014)  . Sleep apnea     "dx'd; don't wear a mask" (11/15/2014)  . History of stomach ulcers 1981  . Migraine     "couple/month" (11/15/2014)  . Headache     "@ least weekly" (11/15/2014)  . Arthritis     "hips, ankles, knees, hands, back" (11/15/2014)  . Chronic back pain     "all over"  . Anxiety     "social"  . Chronic depression     At age of 54years old  . Kidney stones     "I've had several"  . Poorly controlled type 2 diabetes mellitus with circulatory disorder ([place]) 11/15/2014  . Essential hypertension 11/15/2014  . Hyperlipidemia associated with type 2 diabetes mellitus ([place])  11/15/2014  . NSTEMI (non-ST elevated myocardial infarction) ([place]) 11/15/2014  . CAD S/P percutaneous coronary angioplasty 5/18/'16 ; 6/3/'16    a) NSTEMI: m-d LAD 90% -->0% Post PCI - 2 overlap DES ( Promus Premier DES 3.0 x 16 - 2.75  x 16), m-dCx 95%--> 0% post DES PCI (Synergy DES 2.25 x 38 -mini-crushed @ Ostial OM1 , Ostial-prox OM1 90% -> 0% post DES PCI (Synergy DES 2.75 x 24 -> 3.0 mm) - bifurcation minicrush of mCx stent;; b) UA/ACS: mLAD upstream - 3rd overlapping Promus DES 3.0 x 20 (3.25 mm)   . Coronary artery chronic total occlusion: 100% RCA CTO 11/16/2014    Prox RCA to Mid RCA lesion, 100% stenosed.   . Type IVa MI - Peri-PCI 11/17/2014    Prolonged ischemia during bifurcation PCI of  m-d Cx-OM1 with minicrush technique    Medications:  Scheduled:  . furosemide  40 mg Intravenous Once    Assessment: 54 YO female w/ hx of MI, CAD s/p stent in June 2016 presents with chest tightness with SOB. Recently admitted in October w/ chest pain thought to be d/t unstable angina. Trop elevated to 0.16.   Goal of Therapy:  Heparin level 0.3-0.7 units/ml Monitor platelets by anticoagulation protocol: Yes   Plan:  Give 4000 units bolus x 1 Start heparin infusion at 900 units/hr Check anti-Xa level  in 6 hours and daily while on heparin Continue to monitor H&H and platelets  Joya San, PharmD Clinical Pharmacy Resident Pager # [phone] 06/12/2015 7:50 AM

## 2015-06-12 NOTE — Progress Notes (Signed)
  Echocardiogram 2D Echocardiogram has been performed.  Jacqueline Munoz 06/12/2015, 6:09 PM

## 2015-06-12 NOTE — ED Notes (Signed)
Dr. Glick at bedside.  

## 2015-06-12 NOTE — ED Notes (Signed)
Pt unhooked and assisted to bedside commode.

## 2015-06-13 DIAGNOSIS — I248 Other forms of acute ischemic heart disease: Secondary | ICD-10-CM | POA: Diagnosis not present

## 2015-06-13 DIAGNOSIS — I2510 Atherosclerotic heart disease of native coronary artery without angina pectoris: Secondary | ICD-10-CM | POA: Diagnosis present

## 2015-06-13 DIAGNOSIS — M797 Fibromyalgia: Secondary | ICD-10-CM | POA: Diagnosis present

## 2015-06-13 DIAGNOSIS — Z880 Allergy status to penicillin: Secondary | ICD-10-CM | POA: Diagnosis not present

## 2015-06-13 DIAGNOSIS — E876 Hypokalemia: Secondary | ICD-10-CM | POA: Diagnosis present

## 2015-06-13 DIAGNOSIS — Z87891 Personal history of nicotine dependence: Secondary | ICD-10-CM | POA: Diagnosis not present

## 2015-06-13 DIAGNOSIS — Z79899 Other long term (current) drug therapy: Secondary | ICD-10-CM | POA: Diagnosis not present

## 2015-06-13 DIAGNOSIS — Z7984 Long term (current) use of oral hypoglycemic drugs: Secondary | ICD-10-CM | POA: Diagnosis not present

## 2015-06-13 DIAGNOSIS — E039 Hypothyroidism, unspecified: Secondary | ICD-10-CM | POA: Diagnosis present

## 2015-06-13 DIAGNOSIS — F419 Anxiety disorder, unspecified: Secondary | ICD-10-CM | POA: Diagnosis present

## 2015-06-13 DIAGNOSIS — Z7982 Long term (current) use of aspirin: Secondary | ICD-10-CM | POA: Diagnosis not present

## 2015-06-13 DIAGNOSIS — I25119 Atherosclerotic heart disease of native coronary artery with unspecified angina pectoris: Secondary | ICD-10-CM

## 2015-06-13 DIAGNOSIS — G894 Chronic pain syndrome: Secondary | ICD-10-CM | POA: Diagnosis present

## 2015-06-13 DIAGNOSIS — Z79891 Long term (current) use of opiate analgesic: Secondary | ICD-10-CM | POA: Diagnosis not present

## 2015-06-13 DIAGNOSIS — I110 Hypertensive heart disease with heart failure: Secondary | ICD-10-CM | POA: Diagnosis present

## 2015-06-13 DIAGNOSIS — E669 Obesity, unspecified: Secondary | ICD-10-CM | POA: Diagnosis present

## 2015-06-13 DIAGNOSIS — Z955 Presence of coronary angioplasty implant and graft: Secondary | ICD-10-CM | POA: Diagnosis not present

## 2015-06-13 DIAGNOSIS — E782 Mixed hyperlipidemia: Secondary | ICD-10-CM | POA: Diagnosis not present

## 2015-06-13 DIAGNOSIS — F329 Major depressive disorder, single episode, unspecified: Secondary | ICD-10-CM | POA: Diagnosis present

## 2015-06-13 DIAGNOSIS — Z6837 Body mass index (BMI) 37.0-37.9, adult: Secondary | ICD-10-CM | POA: Diagnosis not present

## 2015-06-13 DIAGNOSIS — R490 Dysphonia: Secondary | ICD-10-CM | POA: Diagnosis present

## 2015-06-13 DIAGNOSIS — I5031 Acute diastolic (congestive) heart failure: Secondary | ICD-10-CM | POA: Diagnosis not present

## 2015-06-13 DIAGNOSIS — I252 Old myocardial infarction: Secondary | ICD-10-CM | POA: Diagnosis not present

## 2015-06-13 DIAGNOSIS — I5033 Acute on chronic diastolic (congestive) heart failure: Secondary | ICD-10-CM | POA: Diagnosis present

## 2015-06-13 DIAGNOSIS — Z794 Long term (current) use of insulin: Secondary | ICD-10-CM | POA: Diagnosis not present

## 2015-06-13 DIAGNOSIS — R0602 Shortness of breath: Secondary | ICD-10-CM | POA: Diagnosis not present

## 2015-06-13 DIAGNOSIS — I5032 Chronic diastolic (congestive) heart failure: Secondary | ICD-10-CM

## 2015-06-13 DIAGNOSIS — R911 Solitary pulmonary nodule: Secondary | ICD-10-CM | POA: Diagnosis present

## 2015-06-13 DIAGNOSIS — G4733 Obstructive sleep apnea (adult) (pediatric): Secondary | ICD-10-CM | POA: Diagnosis present

## 2015-06-13 LAB — CBC
HCT: 42.1 % (ref 36.0–46.0)
HEMOGLOBIN: 13.5 g/dL (ref 12.0–15.0)
MCH: 28.5 pg (ref 26.0–34.0)
MCHC: 32.1 g/dL (ref 30.0–36.0)
MCV: 88.8 fL (ref 78.0–100.0)
Platelets: 173 10*3/uL (ref 150–400)
RBC: 4.74 MIL/uL (ref 3.87–5.11)
RDW: 15.2 % (ref 11.5–15.5)
WBC: 8.4 10*3/uL (ref 4.0–10.5)

## 2015-06-13 LAB — HEPARIN LEVEL (UNFRACTIONATED)
HEPARIN UNFRACTIONATED: 0.2 [IU]/mL — AB (ref 0.30–0.70)
Heparin Unfractionated: <0.1 IU/mL — ABNORMAL LOW (ref 0.30–0.70)

## 2015-06-13 LAB — GLUCOSE, CAPILLARY
GLUCOSE-CAPILLARY: 154 mg/dL — AB (ref 65–99)
GLUCOSE-CAPILLARY: 134 mg/dL — AB (ref 65–99)
Glucose-Capillary: 184 mg/dL — ABNORMAL HIGH (ref 65–99)
Glucose-Capillary: 205 mg/dL — ABNORMAL HIGH (ref 65–99)

## 2015-06-13 LAB — BASIC METABOLIC PANEL
ANION GAP: 10 (ref 5–15)
BUN: 6 mg/dL (ref 6–20)
CO2: 27 mmol/L (ref 22–32)
Calcium: 9.4 mg/dL (ref 8.9–10.3)
Chloride: 103 mmol/L (ref 101–111)
Creatinine, Ser: 0.64 mg/dL (ref 0.44–1.00)
GFR calc Af Amer: >60 mL/min (ref >60)
GLUCOSE: 247 mg/dL — AB (ref 65–99)
POTASSIUM: 3.9 mmol/L (ref 3.5–5.1)
Sodium: 140 mmol/L (ref 135–145)

## 2015-06-13 LAB — TROPONIN I: TROPONIN I: 0.32 ng/mL — AB (ref <0.031)

## 2015-06-13 MED ORDER — HEPARIN BOLUS VIA INFUSION
2000.0000 [IU] | Freq: Once | INTRAVENOUS | Status: AC
  Administered 2015-06-13: 2000 [IU] via INTRAVENOUS
  Filled 2015-06-13: qty 2000

## 2015-06-13 MED ORDER — ENOXAPARIN SODIUM 40 MG/0.4ML ~~LOC~~ SOLN
40.0000 mg | SUBCUTANEOUS | Status: DC
  Administered 2015-06-13 – 2015-06-14 (×2): 40 mg via SUBCUTANEOUS
  Filled 2015-06-13 (×3): qty 0.4

## 2015-06-13 NOTE — Progress Notes (Signed)
SUBJECTIVE:  SHOB better.  Not at baseline.  No angina type pain.  SHe does report that she has chest wall pain for which she is given narcotics, but she feels that her pain is under treated.  Three 5 mg Percocets are not enough per her report. She is seen in the pain clinic.  She is not interested in a heart cath at this time as she does not feel that her sx are cardiac related.  She has a vigorous response to IV Lasix.  OBJECTIVE:   Vitals:   Filed Vitals:   06/12/15 1642 06/12/15 1953 06/13/15 0435 06/13/15 0800  BP: 147/73 120/57 169/80 158/85  Pulse: 54 48    Temp: 98.2 F (36.8 C) 98.2 F (36.8 C) 97.8 F (36.6 C)   TempSrc: Oral Oral Oral   Resp: 16 16 18    Height:      Weight:   203 lb 14.4 oz (92.488 kg)   SpO2: 97% 98% 97%    I&O's:   Intake/Output Summary (Last 24 hours) at 06/13/15 0910 Last data filed at 06/13/15 0600  Gross per 24 hour  Intake 602.76 ml  Output   3950 ml  Net -3347.24 ml   TELEMETRY: Reviewed telemetry pt in NSR:     PHYSICAL EXAM General: Well developed, well nourished, in no acute distress Head:   Normal cephalic and atramatic  Lungs:   Clear bilaterally to auscultation. Heart:   HRRR S1 S2  No JVD.   Abdomen: abdomen soft and non-tender Msk:  Back normal,  Normal strength and tone for age. Extremities:   No edema.   Neuro: Alert and oriented. Psych:  Normal affect, responds appropriately Skin: No rash   LABS: Basic Metabolic Panel:  Recent Labs  06/12/15 0642  NA 140  K 3.4*  CL 107  CO2 25  GLUCOSE 184*  BUN <5*  CREATININE 0.56  CALCIUM 8.6*   Liver Function Tests: No results for input(s): AST, ALT, ALKPHOS, BILITOT, PROT, ALBUMIN in the last 72 hours. No results for input(s): LIPASE, AMYLASE in the last 72 hours. CBC:  Recent Labs  06/12/15 0642  WBC 7.8  NEUTROABS 5.8  HGB 12.7  HCT 38.3  MCV 88.7  PLT 153   Cardiac Enzymes:  Recent Labs  06/12/15 1505 06/12/15 1838 06/13/15 0118  TROPONINI  0.47* 0.53* 0.32*   BNP: Invalid input(s): POCBNP D-Dimer: No results for input(s): DDIMER in the last 72 hours. Hemoglobin A1C: No results for input(s): HGBA1C in the last 72 hours. Fasting Lipid Panel: No results for input(s): CHOL, HDL, LDLCALC, TRIG, CHOLHDL, LDLDIRECT in the last 72 hours. Thyroid Function Tests:  Recent Labs  06/12/15 1505  TSH 8.157*   Anemia Panel: No results for input(s): VITAMINB12, FOLATE, FERRITIN, TIBC, IRON, RETICCTPCT in the last 72 hours. Coag Panel:   Lab Results  Component Value Date   INR 0.94 12/02/2014   INR 0.99 11/15/2014   INR 0.91 11/15/2014    RADIOLOGY: Dg Chest Port 1 View  06/12/2015  CLINICAL DATA:  Acute onset of shortness of breath. Initial encounter. EXAM: PORTABLE CHEST 1 VIEW COMPARISON:  Chest radiograph and CTA of the chest performed 04/20/2015 FINDINGS: The lungs are well-aerated. Vascular congestion is noted, with increased interstitial markings, concerning for pulmonary edema. There is no evidence of pleural effusion or pneumothorax. The cardiomediastinal silhouette is mildly enlarged. No acute osseous abnormalities are seen. IMPRESSION: Vascular congestion and mild cardiomegaly, with increased interstitial markings, concerning for pulmonary edema. Electronically  Signed   By: Garald Balding M.D.   On: 06/12/2015 06:36      ASSESSMENT: Kathyrn Lass:    1) CAD: Low level troponin.  Decreasing.  We discussed cath but she feels that her current pain is the chronic pain that she has had for years, and not the anginal pain that she had several months ago before her stents.    2) Acute diastolic heart failure: Improved after diuresis.  Will diurese today and see how she is tomorrow.  OK to eat now.  Hopeful that she will be ready for discharge tomorrow.  Check labs in AM.  3) Hypokalemia:  Supplement potassium.  Jettie Booze, MD  06/13/2015  9:10 AM

## 2015-06-13 NOTE — Progress Notes (Signed)
[place] for heparin Indication: ACS/STEMI  Allergies  Allergen Reactions  . Corticosteroids Anaphylaxis    ALL steroids.     . Penicillins Anaphylaxis    Has patient had a PCN reaction causing immediate rash, facial/tongue/throat swelling, SOB or lightheadedness with hypotension: {Yes Has patient had a PCN reaction causing severe rash involving mucus membranes or skin necrosis: NO Has patient had a PCN reaction that required hospitalization NO Has patient had a PCN reaction occurring within the last 10 years: {Yes If all of the above answers are "NO", then may proceed with Cephalosporin use.     Patient Measurements:  Height: 5\' 2"  (157.5 cm) Weight: 206 lb 1.6 oz (93.486 kg) IBW/kg (Calculated) : 50.1 Heparin Dosing Weight: 70.1 kg  Vital Signs: Temp: 98.2 F (36.8 C) (12/12 1953) Temp Source: Oral (12/12 1953) BP: 120/57 mmHg (12/12 1953) Pulse Rate: 48 (12/12 1953) 1300 Labs:  Recent Labs  06/12/15 0642 06/12/15 1505 06/12/15 1838 06/12/15 2329  HGB 12.7  --   --   --   HCT 38.3  --   --   --   PLT 153  --   --   --   HEPARINUNFRC  --  <0.10*  --  <0.10*  CREATININE 0.56  --   --   --   TROPONINI 0.16* 0.47* 0.53*  --     Estimated Creatinine Clearance: 85.7 mL/min (by C-G formula based on Cr of 0.56).  Assessment: 54 YO female with chest pain for heparin Goal of Therapy:  Heparin level 0.3-0.7 units/ml Monitor platelets by anticoagulation protocol: Yes   Plan:  Heparin 2000 units IV bolus, then increase heparin 1300 units/hr Follow-up am labs.  Phillis Knack, PharmD, BCPS  06/13/2015 12:17 AM

## 2015-06-13 NOTE — Progress Notes (Signed)
[place] for heparin Indication: ACS/STEMI  Allergies  Allergen Reactions  . Corticosteroids Anaphylaxis    ALL steroids.     . Penicillins Anaphylaxis    Has patient had a PCN reaction causing immediate rash, facial/tongue/throat swelling, SOB or lightheadedness with hypotension: {Yes Has patient had a PCN reaction causing severe rash involving mucus membranes or skin necrosis: NO Has patient had a PCN reaction that required hospitalization NO Has patient had a PCN reaction occurring within the last 10 years: {Yes If all of the above answers are "NO", then may proceed with Cephalosporin use.     Patient Measurements:  Height: 5\' 2"  (157.5 cm) Weight: 203 lb 14.4 oz (92.488 kg) IBW/kg (Calculated) : 50.1 Heparin Dosing Weight: 70.1 kg  Vital Signs: Temp: 97.8 F (36.6 C) (12/13 0435) Temp Source: Oral (12/13 0435) BP: 158/85 mmHg (12/13 0800) 1300 Labs:  Recent Labs  06/12/15 0642 06/12/15 1505 06/12/15 1838 06/12/15 2329 06/13/15 0118 06/13/15 0935  HGB 12.7  --   --   --   --  13.5  HCT 38.3  --   --   --   --  42.1  PLT 153  --   --   --   --  173  HEPARINUNFRC  --  <0.10*  --  <0.10*  --  0.20*  CREATININE 0.56  --   --   --   --  0.64  TROPONINI 0.16* 0.47* 0.53*  --  0.32*  --     Estimated Creatinine Clearance: 85.2 mL/min (by C-G formula based on Cr of 0.64).  Assessment: 54 YO female with chest pain initially started on heparin. Most recent heparin level 0.2. Hgb/plt wnl Per cardiology note from today patient feels her current pain is the chronic pain that she has had for years, and not the anginal pain.    Plan:  Discussed with cardiology, will discontinue heaprin and start lovenox DVT prophylaxis  Darl Pikes, PharmD Clinical Pharmacist- Resident Pager: [phone]   06/13/2015 10:40 AM

## 2015-06-13 NOTE — Research (Signed)
REDS'@[email or web address]'$  Study Informed Consent   Subject Name: Jacqueline Munoz  Subject met inclusion and exclusion criteria.  The informed consent form, study requirements and expectations were reviewed with the subject and questions and concerns were addressed prior to the signing of the consent form.  The subject verbalized understanding of the trail requirements.  The subject agreed to participate in the [email or web address]'@[email or web address]$  trial and signed the informed consent.  The informed consent was obtained prior to performance of any protocol-specific procedures for the subject.  A copy of the signed informed consent was given to the subject and a copy was placed in the subject's medical record.  Sandie Ano 06/13/2015, 5:22 PM

## 2015-06-14 LAB — CBC
HEMATOCRIT: 39.5 % (ref 36.0–46.0)
HEMOGLOBIN: 13.1 g/dL (ref 12.0–15.0)
MCH: 29.5 pg (ref 26.0–34.0)
MCHC: 33.2 g/dL (ref 30.0–36.0)
MCV: 89 fL (ref 78.0–100.0)
Platelets: 154 10*3/uL (ref 150–400)
RBC: 4.44 MIL/uL (ref 3.87–5.11)
RDW: 15.3 % (ref 11.5–15.5)
WBC: 6.3 10*3/uL (ref 4.0–10.5)

## 2015-06-14 LAB — BASIC METABOLIC PANEL
ANION GAP: 8 (ref 5–15)
BUN: 7 mg/dL (ref 6–20)
CHLORIDE: 100 mmol/L — AB (ref 101–111)
CO2: 30 mmol/L (ref 22–32)
CREATININE: 0.72 mg/dL (ref 0.44–1.00)
Calcium: 9.2 mg/dL (ref 8.9–10.3)
GFR calc non Af Amer: >60 mL/min (ref >60)
Glucose, Bld: 173 mg/dL — ABNORMAL HIGH (ref 65–99)
POTASSIUM: 4.1 mmol/L (ref 3.5–5.1)
SODIUM: 138 mmol/L (ref 135–145)

## 2015-06-14 LAB — HEPARIN LEVEL (UNFRACTIONATED): Heparin Unfractionated: <0.1 IU/mL — ABNORMAL LOW (ref 0.30–0.70)

## 2015-06-14 LAB — GLUCOSE, CAPILLARY
GLUCOSE-CAPILLARY: 161 mg/dL — AB (ref 65–99)
GLUCOSE-CAPILLARY: 175 mg/dL — AB (ref 65–99)
GLUCOSE-CAPILLARY: 137 mg/dL — AB (ref 65–99)
Glucose-Capillary: 105 mg/dL — ABNORMAL HIGH (ref 65–99)

## 2015-06-14 MED ORDER — FUROSEMIDE 40 MG PO TABS
40.0000 mg | ORAL_TABLET | Freq: Two times a day (BID) | ORAL | Status: DC
  Administered 2015-06-14 – 2015-06-15 (×2): 40 mg via ORAL
  Filled 2015-06-14 (×2): qty 1

## 2015-06-14 NOTE — Progress Notes (Signed)
Patient Name: Jacqueline Munoz Date of Encounter: 06/14/2015     Active Problems:   CHF (congestive heart failure) (HCC)   Acute diastolic heart failure ([place])   Coronary artery disease involving native coronary artery of native heart with angina pectoris (HCC)   Shortness of breath    SUBJECTIVE  Breathing a lot better but not back at baseline. No more CP  CURRENT MEDS . aspirin EC  81 mg Oral Daily  . atorvastatin  80 mg Oral q1800  . carvedilol  3.125 mg Oral BID WC  . clopidogrel  75 mg Oral Daily  . enoxaparin (LOVENOX) injection  40 mg Subcutaneous Q24H  . furosemide  40 mg Intravenous BID  . insulin aspart  0-15 Units Subcutaneous TID WC  . isosorbide mononitrate  30 mg Oral Daily  . levothyroxine  175 mcg Oral QAC breakfast  . lisinopril  10 mg Oral Daily  . potassium chloride  20 mEq Oral BID  . sodium chloride  3 mL Intravenous Q12H    OBJECTIVE  Filed Vitals:   06/13/15 1126 06/13/15 2026 06/14/15 0639 06/14/15 0830  BP: 134/68 119/46 150/77 157/79  Pulse: 55 50 54 48  Temp: 98.2 F (36.8 C) 97.7 F (36.5 C) 97.6 F (36.4 C)   TempSrc: Oral Oral Oral   Resp: 16 18 16    Height:      Weight:   200 lb 3.2 oz (90.81 kg)   SpO2: 100% 98% 96%     Intake/Output Summary (Last 24 hours) at 06/14/15 1000 Last data filed at 06/14/15 0947  Gross per 24 hour  Intake   1115 ml  Output   2550 ml  Net  -1435 ml   Filed Weights   06/12/15 1328 06/13/15 0435 06/14/15 0639  Weight: 206 lb 1.6 oz (93.486 kg) 203 lb 14.4 oz (92.488 kg) 200 lb 3.2 oz (90.81 kg)    PHYSICAL EXAM  General: Pleasant, NAD. Neuro: Alert and oriented X 3. Moves all extremities spontaneously. Psych: Normal affect. HEENT:  Normal  Neck: Supple without bruits or JVD. Lungs:  Resp regular and unlabored, CTA. Heart: RRR no s3, s4, or murmurs. Abdomen: Soft, non-tender, non-distended, BS + x 4.  Extremities: No clubbing, cyanosis or edema. DP/PT/Radials 2+ and equal  bilaterally.  Accessory Clinical Findings  CBC  Recent Labs  06/12/15 0642 06/13/15 0935 06/14/15 0319  WBC 7.8 8.4 6.3  NEUTROABS 5.8  --   --   HGB 12.7 13.5 13.1  HCT 38.3 42.1 39.5  MCV 88.7 88.8 89.0  PLT 153 173 [id number]   Basic Metabolic Panel  Recent Labs  06/13/15 0935 06/14/15 0319  NA 140 138  K 3.9 4.1  CL 103 100*  CO2 27 30  GLUCOSE 247* 173*  BUN 6 7  CREATININE 0.64 0.72  CALCIUM 9.4 9.2   Liver Function Tests No results for input(s): AST, ALT, ALKPHOS, BILITOT, PROT, ALBUMIN in the last 72 hours. No results for input(s): LIPASE, AMYLASE in the last 72 hours. Cardiac Enzymes  Recent Labs  06/12/15 1505 06/12/15 1838 06/13/15 0118  TROPONINI 0.47* 0.53* 0.32*   BNP Invalid input(s): POCBNP D-Dimer No results for input(s): DDIMER in the last 72 hours. Hemoglobin A1C No results for input(s): HGBA1C in the last 72 hours. Fasting Lipid Panel No results for input(s): CHOL, HDL, LDLCALC, TRIG, CHOLHDL, LDLDIRECT in the last 72 hours. Thyroid Function Tests  Recent Labs  06/12/15 1505  TSH 8.157*    TELE  NSR  with sinus bradycardia that is asymptomatic  Radiology/Studies  Dg Chest Port 1 View  06/12/2015  CLINICAL DATA:  Acute onset of shortness of breath. Initial encounter. EXAM: PORTABLE CHEST 1 VIEW COMPARISON:  Chest radiograph and CTA of the chest performed 04/20/2015 FINDINGS: The lungs are well-aerated. Vascular congestion is noted, with increased interstitial markings, concerning for pulmonary edema. There is no evidence of pleural effusion or pneumothorax. The cardiomediastinal silhouette is mildly enlarged. No acute osseous abnormalities are seen. IMPRESSION: Vascular congestion and mild cardiomegaly, with increased interstitial markings, concerning for pulmonary edema. Electronically Signed   By: Garald Balding M.D.   On: 06/12/2015 06:36    2D: 06/12/2015 LV EF: 55% -  60% Study Conclusions - Left ventricle: The cavity size  was normal. Systolic function was normal. The estimated ejection fraction was in the range of 55% to 60%. Wall motion was normal; there were no regional wall motion abnormalities. Features are consistent with a pseudonormal left ventricular filling pattern, with concomitant abnormal relaxation and increased filling pressure (grade 2 diastolic dysfunction). - Mitral valve: Calcified annulus. There was mild regurgitation. - Left atrium: The atrium was mildly dilated. - Atrial septum: There was increased thickness of the septum, consistent with lipomatous hypertrophy. - Tricuspid valve: There was mild regurgitation.   ASSESSMENT AND PLAN  Jacqueline Munoz is a 54 y.o. female with a history of CAD: NSTEMI s/p DES x2 LAD and DESx1 to bifurcation of OM1 and mid to distal LCx (11/16/14); [place] s/p overlapping DES x1 to LAD for de novo 99% focal lesion in LAD (12/02/2014); know CTO of RCA, HLD, HTN, DMT2, previous tobacco abuse, OSA not on CPAP, hypothyroidism, pulmonary nodules and fibromyalgia who presented to [facility] today with SOB and found to have NSTEMI/CHF.   CAD/Elevated troponin: 0.16 --> 0.47 --> 0.53--> 0.32. She has had mild chest tightness but sx not reminiscent of previous cardiac angina. Likely demand in the setting acute CHF. ECG with no acute ST or TW changes. She did not want a heart cath. -- She was placed on IV heparin, which was later discontinued  -- Continue ASA, statin and BB.  Acute on chronic diastolic CHF (new onset CHF): BNP midlly elevated and CXR with pulm vasc congestion. Patient with s/s CHF. -- 2D ECHO (06/12/15) w/ normal LVEF and G2DD. -- Placed on IV lasix 40mg  BID. Net neg 5.5L. Weight down 6 lbs. ( 206-->200). Will start her on po lasix 40mg  BID starting tonight. Hopefully home tomorrow.   -- Strict I/Os and daily weights -- Low sodium diet   HTN: BP w/ moderate control. Continue coreg 3.125mg  BID, lisinopril 10mg  po qd and imdur 30mg  po qd  DM:  continue SSI  Hypokalemia- repleted   Pulmonary nodules: CT Chest (04/20/15) showed multiple calcified and noncalcified nodules in right lung. Largest is 6 mm. Likely represent calcified or noncalcified granuloma. Recommend followup chest CT in 9-12 months. Due to get repeat in July 2017.   Dispo- likely home tomorrow.   SignedEileen Stanford PA-C  Pager [phone]  I have examined the patient and reviewed assessment and plan and discussed with patient.  Agree with above as stated.  Diuresing well.  Switch to PO lasix.  Plan for discharge tomorrow.  She has an ENT laser surgery planned to help with her airway issues. THis may also be contributing to her SHOB/DOE sensation.  Chantry Headen S.

## 2015-06-15 ENCOUNTER — Telehealth: Payer: Self-pay | Admitting: Cardiology

## 2015-06-15 ENCOUNTER — Encounter (HOSPITAL_COMMUNITY): Payer: Self-pay | Admitting: Physician Assistant

## 2015-06-15 DIAGNOSIS — Z9861 Coronary angioplasty status: Secondary | ICD-10-CM

## 2015-06-15 DIAGNOSIS — E785 Hyperlipidemia, unspecified: Secondary | ICD-10-CM | POA: Diagnosis present

## 2015-06-15 DIAGNOSIS — E119 Type 2 diabetes mellitus without complications: Secondary | ICD-10-CM

## 2015-06-15 DIAGNOSIS — M797 Fibromyalgia: Secondary | ICD-10-CM | POA: Diagnosis present

## 2015-06-15 DIAGNOSIS — E782 Mixed hyperlipidemia: Secondary | ICD-10-CM

## 2015-06-15 DIAGNOSIS — I248 Other forms of acute ischemic heart disease: Secondary | ICD-10-CM

## 2015-06-15 DIAGNOSIS — I25119 Atherosclerotic heart disease of native coronary artery with unspecified angina pectoris: Secondary | ICD-10-CM | POA: Insufficient documentation

## 2015-06-15 DIAGNOSIS — Z87891 Personal history of nicotine dependence: Secondary | ICD-10-CM

## 2015-06-15 DIAGNOSIS — I2510 Atherosclerotic heart disease of native coronary artery without angina pectoris: Secondary | ICD-10-CM

## 2015-06-15 LAB — BASIC METABOLIC PANEL
ANION GAP: 9 (ref 5–15)
BUN: 10 mg/dL (ref 6–20)
CO2: 31 mmol/L (ref 22–32)
CREATININE: 0.69 mg/dL (ref 0.44–1.00)
Calcium: 9.4 mg/dL (ref 8.9–10.3)
Chloride: 97 mmol/L — ABNORMAL LOW (ref 101–111)
GFR calc Af Amer: >60 mL/min (ref >60)
GFR calc non Af Amer: >60 mL/min (ref >60)
Glucose, Bld: 194 mg/dL — ABNORMAL HIGH (ref 65–99)
POTASSIUM: 4.4 mmol/L (ref 3.5–5.1)
SODIUM: 137 mmol/L (ref 135–145)

## 2015-06-15 LAB — GLUCOSE, CAPILLARY: Glucose-Capillary: 180 mg/dL — ABNORMAL HIGH (ref 65–99)

## 2015-06-15 MED ORDER — POTASSIUM CHLORIDE CRYS ER 20 MEQ PO TBCR: 20.0000 meq | EXTENDED_RELEASE_TABLET | Freq: Every day | ORAL | Status: DC

## 2015-06-15 MED ORDER — FUROSEMIDE 40 MG PO TABS: 40.0000 mg | ORAL_TABLET | Freq: Every day | ORAL | Status: DC

## 2015-06-15 MED ORDER — LEVOTHYROXINE SODIUM 75 MCG PO TABS
175.0000 ug | ORAL_TABLET | Freq: Every day | ORAL | Status: DC
  Administered 2015-06-15: 175 ug via ORAL
  Filled 2015-06-15: qty 1

## 2015-06-15 MED ORDER — LEVOTHYROXINE SODIUM 200 MCG PO TABS: 200.0000 ug | ORAL_TABLET | Freq: Every day | ORAL | Status: DC

## 2015-06-15 NOTE — Progress Notes (Signed)
Patient Name: Jacqueline Munoz Date of Encounter: 06/15/2015     Active Problems:   CHF (congestive heart failure) (HCC)   Acute diastolic heart failure ([place])   Coronary artery disease involving native coronary artery of native heart with angina pectoris (HCC)   Shortness of breath    SUBJECTIVE  Sleeping. Feeling much better. Ready to go home. Wants home scale so she can weigh herself daily but does not have the money to buy one.  CURRENT MEDS . aspirin EC  81 mg Oral Daily  . atorvastatin  80 mg Oral q1800  . carvedilol  3.125 mg Oral BID WC  . clopidogrel  75 mg Oral Daily  . enoxaparin (LOVENOX) injection  40 mg Subcutaneous Q24H  . furosemide  40 mg Oral BID  . insulin aspart  0-15 Units Subcutaneous TID WC  . isosorbide mononitrate  30 mg Oral Daily  . levothyroxine  175 mcg Oral QAC breakfast  . lisinopril  10 mg Oral Daily  . potassium chloride  20 mEq Oral BID  . sodium chloride  3 mL Intravenous Q12H    OBJECTIVE  Filed Vitals:   06/14/15 1124 06/14/15 2048 06/15/15 0008 06/15/15 0453  BP: 131/66 130/89 138/98 107/56  Pulse: 49 48 78 47  Temp: 98.3 F (36.8 C) 98.3 F (36.8 C) 98.2 F (36.8 C) 98 F (36.7 C)  TempSrc: Oral Oral Oral Oral  Resp: 16 18 20 18   Height:      Weight:    197 lb 11.2 oz (89.676 kg)  SpO2: 100% 98% 97% 99%    Intake/Output Summary (Last 24 hours) at 06/15/15 0633 Last data filed at 06/15/15 0537  Gross per 24 hour  Intake   1335 ml  Output   2400 ml  Net  -1065 ml   Filed Weights   06/13/15 0435 06/14/15 0639 06/15/15 0453  Weight: 203 lb 14.4 oz (92.488 kg) 200 lb 3.2 oz (90.81 kg) 197 lb 11.2 oz (89.676 kg)    PHYSICAL EXAM  General: Pleasant, NAD. Neuro: Alert and oriented X 3. Moves all extremities spontaneously. Psych: Normal affect. HEENT:  Normal  Neck: Supple without bruits or JVD. Lungs:  Resp regular and unlabored, CTA. Heart: RRR no s3, s4, or murmurs. Abdomen: Soft, non-tender, non-distended, BS + x  4.  Extremities: No clubbing, cyanosis or edema. DP/PT/Radials 2+ and equal bilaterally.  Accessory Clinical Findings  CBC  Recent Labs  06/12/15 0642 06/13/15 0935 06/14/15 0319  WBC 7.8 8.4 6.3  NEUTROABS 5.8  --   --   HGB 12.7 13.5 13.1  HCT 38.3 42.1 39.5  MCV 88.7 88.8 89.0  PLT 153 173 [id number]   Basic Metabolic Panel  Recent Labs  06/13/15 0935 06/14/15 0319  NA 140 138  K 3.9 4.1  CL 103 100*  CO2 27 30  GLUCOSE 247* 173*  BUN 6 7  CREATININE 0.64 0.72  CALCIUM 9.4 9.2    Cardiac Enzymes  Recent Labs  06/12/15 1505 06/12/15 1838 06/13/15 0118  TROPONINI 0.47* 0.53* 0.32*   Thyroid Function Tests  Recent Labs  06/12/15 1505  TSH 8.157*    TELE  NSR with sinus bradycardia that is asymptomatic  Radiology/Studies  Dg Chest Port 1 View  06/12/2015  CLINICAL DATA:  Acute onset of shortness of breath. Initial encounter. EXAM: PORTABLE CHEST 1 VIEW COMPARISON:  Chest radiograph and CTA of the chest performed 04/20/2015 FINDINGS: The lungs are well-aerated. Vascular congestion is noted, with increased interstitial  markings, concerning for pulmonary edema. There is no evidence of pleural effusion or pneumothorax. The cardiomediastinal silhouette is mildly enlarged. No acute osseous abnormalities are seen. IMPRESSION: Vascular congestion and mild cardiomegaly, with increased interstitial markings, concerning for pulmonary edema. Electronically Signed   By: Garald Balding M.D.   On: 06/12/2015 06:36    2D: 06/12/2015 LV EF: 55% -  60% Study Conclusions - Left ventricle: The cavity size was normal. Systolic function was normal. The estimated ejection fraction was in the range of 55% to 60%. Wall motion was normal; there were no regional wall motion abnormalities. Features are consistent with a pseudonormal left ventricular filling pattern, with concomitant abnormal relaxation and increased filling pressure (grade 2 diastolic dysfunction). -  Mitral valve: Calcified annulus. There was mild regurgitation. - Left atrium: The atrium was mildly dilated. - Atrial septum: There was increased thickness of the septum, consistent with lipomatous hypertrophy. - Tricuspid valve: There was mild regurgitation.   ASSESSMENT AND PLAN  Jacqueline Munoz is a 54 y.o. female with a history of CAD: NSTEMI s/p DES x2 LAD and DESx1 to bifurcation of OM1 and mid to distal LCx (11/16/14); [place] s/p overlapping DES x1 to LAD for de novo 99% focal lesion in LAD (12/02/2014); known CTO of RCA, HLD, HTN, DMT2, previous tobacco abuse, OSA not on CPAP, hypothyroidism, pulmonary nodules and fibromyalgia who presented to [facility] on 06/12[facility]15 with SOB and found to have acute new onset CHF.   Acute on chronic diastolic CHF (first episode): BNP midlly elevated and CXR with pulm vasc congestion. Patient with s/s CHF. -- 2D ECHO (06/12/15) w/ normal LVEF and G2DD. -- Placed on IV lasix 40mg  BID. Net neg 3.2L. Weight down 9 lbs. ( 206--> 197). Switched to PO lasix 40mg  BID last night and still negative 821mL.   -- She has an ENT laser surgery planned to help with her airway issues. This may also be contributing to her SHOB/DOE sensation. -- Low sodium diet, fluid restriction and daily weights discussed. Will obtain 1 week TOC appointment. Will talk to SW about getting her a home scale.   CAD/Elevated troponin: 0.16 --> 0.47 --> 0.53--> 0.32. She has had mild chest tightness but sx not reminiscent of previous cardiac angina. Likely demand in the setting acute CHF. ECG with no acute ST or TW changes. She did not want a heart cath. -- She was placed on IV heparin, which was later discontinued  -- Continue ASA, statin and BB.  HTN: BP w/ good control. Continue coreg 3.125mg  BID, lisinopril 10mg  po qd and imdur 30mg  po qd  DM: continue SSI  Hypokalemia- repleted   Pulmonary nodules: CT Chest (04/20/15) showed multiple calcified and noncalcified nodules in right lung. Largest is  6 mm. Likely represent calcified or noncalcified granuloma. Recommend followup chest CT in 9-12 months. Due to get repeat in July 2017.   Hypothyroidism- TSH 8.157.  Will increase levothyroxine from 119mcg to 200mg  and have her f/up with her PCP  Dispo- home today with TOC follow up in 1 week.   SignedEileen Stanford PA-C  Pager [phone]  I have examined the patient and reviewed assessment and plan and discussed with patient.  Agree with above as stated.  Feels better after diuresis. Ultimately, she may need daily furosemide. At this point, she would like to weigh herself every day and take when necessary furosemide. She now knows what to look for. She will be given a scale as well. She will have close follow-up.  The need for daily diuretic could be reassessed at that time. Long-term, she'll follow-up with Dr. Ellyn Hack.  Jacqueline Barron S.

## 2015-06-15 NOTE — Hospital Discharge Follow-Up (Signed)
Transitional Care Clinic Care Coordination Note:  Admit date:  06/12/15 Discharge date: 06/15/15 Discharge Disposition: Home Patient contact: [phone] (home); [phone] (mobile) Emergency contact(s): none  This Case Manager reviewed patient's EMR and determined patient would benefit from post-discharge medical management and chronic care management services through the [facility]. Patient has a history of CAD, HLD, HTN, DM type 2. Admitted for acute on chronic diastolic CHF (first episode) and CAD/elevated troponin.  Patient has had 4 inpatient admissions in the last year. This Case Manager met with patient to discuss the services and medical management that can be provided at the [facility]. Patient verbalized understanding and agreed to receive post-discharge care at the [facility].   Patient scheduled for Transitional Care appointment on 06/27/15 at 1100 with Dr. Jarold Song.  Clinic information and appointment time provided to patient. Appointment information also placed on AVS.  Assessment:       Home Environment: Patient lives in an apartment with her two daughters (aged 72 and 23). She indicated she has 6 steps into her apartment.       Support System: children though patient indicated she lacks a strong support system       Level of functioning: Independent       Home DME: none.  Patient does indicate she has a glucometer and checks her blood glucose as directed. She also indicated she was informed she will be given a scale prior to discharge.       Home care services: none       Transportation: Patient indicated she typically drives to her medical appointments. Patient indicated she is considering applying for SCAT. Discussed process of qualifying for SCAT with patient, and patient verbalized understanding. May benefit from clinic providing transportation services if patient unable to drive to her follow-up appointment. Patient informed to call  clinic if she ever feels too weak to drive to appointments as transportation can be arranged, and patient verbalized understanding.        Food/Nutrition: Patient indicated she does not have food stamps; however, her daughter has applied and application pending.  Encouraged patient to contact Department of Social Services to follow-up on application. Patient indicated she often does not have enough food. Provided patient with a list of free meals and food pantries in [place]. Left voicemail for Olga Coaster, RN CM informing her that information given.  Patient would benefit from meeting with Education officer, museum at [employer] and [employer]. Vesta Mixer, SW notified.        Medications: Patient indicated she gets her medications (except pain medications) from the pharmacy at [facility]. Patient indicated she gets her pain medications from [facility]. She indicated she is able to afford her medications. She also indicated her insurance will be changing as of July 02, 2015 (she will have [employer] Medicare), and she indicated her medication coverage will improve.        Identified Barriers: lack of needed food-Food pantries, free meal resources given to patient.  Vesta Mixer, SW updated. Discussed follow-up with the Department of Social Services.  Additional barrier: possible transportation barrier. Will need to determine if patient has transportation prior to her appointment on 06/27/15.        PCP: Dr. Lenard [facility] [place]  Patient Education:            Arranged services:        Services communicated to: Voicemail left for Olga Coaster, RN CM informing her  of patient's scheduled appointment with the [facility].

## 2015-06-15 NOTE — Discharge Summary (Signed)
Discharge Summary   Patient ID: Sherissa Tenenbaum MRN: [id number], DOB/AGE: 07-09-1960 54 y.o. Admit date: 06/12/2015 D/C date:     06/15/2015  Primary Cardiologist: Dr. Ellyn Hack  Principal Problem:   Acute diastolic heart failure ([place]) Active Problems:   Demand ischemia [facility])   Essential hypertension   Hypothyroid   Former very heavy cigarette smoker (more than 40 per day)   Anxiety and depression   Voice hoarseness   Chronic pain syndrome   Incidental lung nodule, > 45m and < 865m  Obesity   Mixed hyperlipidemia   Fibromyalgia   Diabetes mellitus, type 2 (HCC)   CAD S/P percutaneous coronary angioplasty   HLD (hyperlipidemia)    Admission Dates: 06/12/15-06/15/15 Discharge Diagnosis: acute diastolic CHF  HPI: MeNdia Sampaths a 54.1.o. female with a history of CAD: NSTEMI s/p DES x2 LAD and DESx1 to bifurcation of OM1 and mid to distal LCx (11/16/14); [place]/p overlapping DES x1 to LAD for de novo 99% focal lesion in LAD (12/02/2014); known CTO of RCA, HLD, HTN, DMT2, previous tobacco abuse, OSA not on CPAP, hypothyroidism, pulmonary nodules and fibromyalgia who [facility] and found to have acute new onset CHF.    She was first admitted in 10/2014 for chest pain and found to have NSTEMI (troponin max 1.84). Underwent cardiac cath 5/18 and noted to have severe 3 vessel obstructive coronary artery disease w/ stenting of the mid LAD done with overlapping DES x2 as well as stenting of bifurcation of first OM and mid to distal left circumflex with DES x1. She has an chronic totally occluded RCA. She presented back to the hospital 11/2015 with unstable angina and underwent repeat catheterization. This revealed patent stents and a de novo 99% focal lesion in LAD proximal to the previously placed overlapping stents, and a third overlapping DES stent was placed. 2D ECHO with normal LV function EF 60% and RWMAs. Previous ECHO in 10/2014 with G1DD.   She has been followed  by Dr. HaEllyn Hacks an outpatient and was converted from Brilinta to Plavix recently for financial concerns. Of note, CT Chest showed multiple calcified and noncalcified nodules in right lung. Largest is 6 mm. Likely represent calcified or noncalcified granuloma. Recommend followup chest CT in 6-12 months.  [facility] for new onset acute diastolic CHF. She presented with worsening SOB, orthopnea and PND which was new for her. She was noted to have s/s volume overload as well as elevated cardiac enzymes. However, her symptoms were unlike her previous anginal chest pain. Her troponin remained low and flat c/w demand ischemia and IV heparin discontinued. She was diuresed with IV lasix ~6 L and 9 lbs. Repeat 2D ECHO 06/12/15 showed LVEF and G2DD. Discharge weight 197. She will be sent home with a scale to perform daily weights. She was converted to PO lasix '40mg'$  qd as needed for weight gain and discharged on 06/15/15. Of note, she has an ENT laser surgery planned to help with her airway issues which was felt to possibly be contributing to her SHOB/DOE sensation.  Hospital Course  Acute on chronic diastolic CHF (first episode): BNP midlly elevated and CXR with pulm vasc congestion. Patient with s/s CHF. -- 2D ECHO (06/12/15) w/ normal LVEF and G2DD. -- Placed on IV lasix '40mg'$  BID. Net neg 3.2L. Weight down 9 lbs. ( 206--> 197). Switched to PO lasix '40mg'$  BID last night and still negative 86548mome  on lasix '40mg'$  PRN for weight gain -- She has an ENT laser surgery planned to help with her airway issues. This may also be contributing to her SHOB/DOE sensation. -- Low sodium diet, fluid restriction and daily weights discussed. Will obtain 1 week TOC appointment. Will talk to SW about getting her a home scale.   CAD/Elevated troponin: 0.16 --> 0.47 --> 0.53--> 0.32. She has had mild chest tightness but sx not reminiscent of previous cardiac angina. Likely demand in the  setting acute CHF. ECG with no acute ST or TW changes. She did not want a heart cath. -- She was placed on IV heparin, which was later discontinued  -- Continue ASA, statin and BB.  HTN: BP w/ good control. Continue coreg 3.'125mg'$  BID, lisinopril '10mg'$  po qd and imdur '30mg'$  po qd  DM: continue home regimen  Hypokalemia- repleted   Hypothyroidism- TSH 8.157.  Will increase levothyroxine from 121mg to '200mg'$  and have her f/up with her PCP  Pulmonary nodules: CT Chest (04/20/15) showed multiple calcified and noncalcified nodules in right lung. Largest is 6 mm. Likely represent calcified or noncalcified granuloma. Recommend followup chest CT in 9-12 months. Due to get repeat in July 2017.   Dispo- home today with TOC follow up in 1 week.   Signed,  The patient has had an uncomplicated hospital course and is recovering well. She has been seen by Dr. VIrish Lacktoday and deemed ready for discharge home. All follow-up appointments have been scheduled.  Discharge medications are listed below.   Discharge Vitals: Blood pressure 124/83, pulse 58, temperature 98 F (36.7 C), temperature source Oral, resp. rate 18, height '5\' 2"'$  (1.575 m), weight 197 lb 11.2 oz (89.676 kg), SpO2 99 %.  Labs: Lab Results  Component Value Date   WBC 6.3 06/14/2015   HGB 13.1 06/14/2015   HCT 39.5 06/14/2015   MCV 89.0 06/14/2015   PLT 154 06/14/2015     Recent Labs Lab 06/15/15 0535  NA 137  K 4.4  CL 97*  CO2 31  BUN 10  CREATININE 0.69  CALCIUM 9.4  GLUCOSE 194*    Recent Labs  06/12/15 1505 06/12/15 1838 06/13/15 0118  TROPONINI 0.47* 0.53* 0.32*   Lab Results  Component Value Date   CHOL 135 04/21/2015   HDL 25* 04/21/2015   LDLCALC 41 04/21/2015   TRIG 346* 04/21/2015   No results found for: DDIMER  Diagnostic Studies/Procedures   Dg Chest Port 1 View  06/12/2015  CLINICAL DATA:  Acute onset of shortness of breath. Initial encounter. EXAM: PORTABLE CHEST 1 VIEW COMPARISON:   Chest radiograph and CTA of the chest performed 04/20/2015 FINDINGS: The lungs are well-aerated. Vascular congestion is noted, with increased interstitial markings, concerning for pulmonary edema. There is no evidence of pleural effusion or pneumothorax. The cardiomediastinal silhouette is mildly enlarged. No acute osseous abnormalities are seen. IMPRESSION: Vascular congestion and mild cardiomegaly, with increased interstitial markings, concerning for pulmonary edema. Electronically Signed   By: JGarald BaldingM.D.   On: 06/12/2015 06:36    2D ECHO: 06/12/2015 LV EF: 55% -  60% Study Conclusions - Left ventricle: The cavity size was normal. Systolic function was normal. The estimated ejection fraction was in the range of 55% to 60%. Wall motion was normal; there were no regional wall motion abnormalities. Features are consistent with a pseudonormal left ventricular filling pattern, with concomitant abnormal relaxation and increased filling pressure (grade 2 diastolic dysfunction). - Mitral valve: Calcified annulus. There was mild  regurgitation. - Left atrium: The atrium was mildly dilated. - Atrial septum: There was increased thickness of the septum, consistent with lipomatous hypertrophy. - Tricuspid valve: There was mild regurgitation.   Discharge Medications     Medication List    STOP taking these medications        ibuprofen 200 MG tablet  Commonly known as:  ADVIL,MOTRIN      TAKE these medications        aspirin 81 MG EC tablet  Take 1 tablet (81 mg total) by mouth daily.     atorvastatin 80 MG tablet  Commonly known as:  LIPITOR  Take 1 tablet (80 mg total) by mouth daily at 6 PM.     carvedilol 3.125 MG tablet  Commonly known as:  COREG  Take 1 tablet (3.125 mg total) by mouth 2 (two) times daily with a meal.     clopidogrel 75 MG tablet  Commonly known as:  PLAVIX  Take 1 tablet (75 mg total) by mouth daily.     furosemide 40 MG tablet    Commonly known as:  LASIX  Take 1 tablet (40 mg total) by mouth daily.     glucose monitoring kit monitoring kit  1 each by Does not apply route 4 (four) times daily - after meals and at bedtime. 1 month Diabetic Testing Supplies for QAC-QHS accuchecks.     Insulin Detemir 100 UNIT/ML Pen  Commonly known as:  LEVEMIR FLEXPEN  Inject 35 Units into the skin at bedtime.     Insulin Syringe-Needle U-100 30G X 5/16" 0.5 ML Misc  Commonly known as:  TRUEPLUS INSULIN SYRINGE  1 each by Does not apply route 4 (four) times daily.     isosorbide mononitrate 30 MG 24 hr tablet  Commonly known as:  IMDUR  Take 1 tablet (30 mg total) by mouth daily.     levothyroxine 200 MCG tablet  Commonly known as:  SYNTHROID, LEVOTHROID  Take 1 tablet (200 mcg total) by mouth daily before breakfast.     lisinopril 10 MG tablet  Commonly known as:  PRINIVIL,ZESTRIL  Take 10 mg by mouth daily.     meclizine 25 MG tablet  Commonly known as:  ANTIVERT  Take 1 tablet (25 mg total) by mouth 2 (two) times daily as needed for dizziness.     Medical Compression Stockings Misc  Wear daily 30 mm Hg pressure     metFORMIN 500 MG tablet  Commonly known as:  GLUCOPHAGE  Take 1 tablet (500 mg total) by mouth 2 (two) times daily with a meal.     nitroGLYCERIN 0.4 MG SL tablet  Commonly known as:  NITROSTAT  Place 0.4 mg under the tongue every 5 (five) minutes as needed for chest pain (may take three doses by mouth per day as needed).     oxyCODONE-acetaminophen 5-325 MG tablet  Commonly known as:  PERCOCET/ROXICET  Take 1-2 tablets by mouth every 4 (four) hours as needed for severe pain.     potassium chloride SA 20 MEQ tablet  Commonly known as:  K-DUR,KLOR-CON  Take 1 tablet (20 mEq total) by mouth daily.        Disposition   The patient will be discharged in stable condition to home.  Follow-up Information    Follow up with Eileen Stanford, PA-C On 06/21/2015.   Specialties:  Cardiology,  Radiology   Why:  @ 9:30am   Contact information:   [facility]  [address] [place] [address] [phone]       Follow up with [name], MD.   Specialty:  Family Medicine   Why:  Please see about your thryroid function   Contact information:   [place] [address] [phone]         Duration of Discharge Encounter: Greater than 30 minutes including physician and PA time.  SignedAngelena Form R PA-C 06/15/2015, 10:02 AM  I have examined the patient and reviewed assessment and plan and discussed with patient. Agree with above as stated. Feels better after diuresis. She is now agreeable daily furosemide. She will weigh herself every day and inform us if there is weight gain. She now knows what to look for. She will be given a scale as well. She will have close follow-up. The dose of daily diuretic could be reassessed at that time. Long-term, she'll follow-up with Dr. Ellyn Hack.  Darik Massing S.

## 2015-06-15 NOTE — Progress Notes (Signed)
Heart Failure Navigator Consult Note  Presentation: Jacqueline Munoz is a 54 y.o. female with a history of CAD: NSTEMI s/p DES x2 LAD and DESx1 to bifurcation of OM1 and mid to distal LCx (11/16/14); [place] s/p overlapping DES x1 to LAD for de novo 99% focal lesion in LAD (12/02/2014); know CTO of RCA, HLD, HTN, DMT2, previous tobacco abuse, OSA not on CPAP, hypothyroidism, pulmonary nodules and fibromyalgia who [facility] with SOB and found to have NSTEMI/CHF.  She was first admitted in 10/2014 for chest pain and found to have NSTEMI (troponin max 1.84) Underwent cardiac cath 5/18 and noted to have severe 3 vessel obstructive coronary artery disease w/ stenting of the mid LAD done with overlapping DES x2 as well as stenting of bifurcation of first OM and mid to distal left circumflex with DES x1. She has an chronic totally occluded RCA. She presented back to the hospital 11/2015 with unstable angina and underwent repeat catheterization. This revealed patent stents and a de novo 99% focal lesion in LAD proximal to the previously placed overlapping stents, and a third overlapping DES stent was placed. 2D ECHO with normal LV function EF 60% and RWMAs. Previous ECHO in 10/2014 with G1DD.   She has been followed by Dr. Ellyn Hack as an outpatient and was converted from Brilinta to Plavix last visit for financial concerns. Of note, CT Chest showed multiple calcified and noncalcified nodules in right lung. Largest is 6 mm. Likely represent calcified or noncalcified granuloma. Recommend followup chest CT in 6-12 months.  Over the past couple days she has noticed worsening SOB, orthopnea and PND which is new for her. No LE edema or abdominal distention. She woke up this morning and felt like she just could not get her breath and decided to be seen in the emergency room. She has lasix at home to use as needed for LE edema but she really has not needed to use this. According to patient this is not at all like her  previous heart attack symptomatology.   Past Medical History  Diagnosis Date  . Fibromyalgia   . History of tobacco abuse   . Hypothyroid   . OSA (obstructive sleep apnea)     a. does not wear CPAP  . History of stomach ulcers   . Arthritis   . Chronic back pain   . Chronic depression   . Kidney stones   . Essential hypertension   . HLD (hyperlipidemia)   . CAD S/P percutaneous coronary angioplasty     a. NSTEMI: s/p DES x2 LAD and DES to bifurcation of OM1 and mid to distal LCx (11/16/14) b. UA/ACS:  overlapping DES x1 to LAD for de novo 99% focal lesion in LAD (12/02/2014)  . Coronary artery chronic total occlusion: 100% RCA CTO 11/16/2014    a. Prox RCA to Mid RCA lesion, 100% stenosed.   . Type IVa MI - Peri-PCI 11/17/2014    Prolonged ischemia during bifurcation PCI of  m-d Cx-OM1 with minicrush technique  . Diabetes mellitus, type 2 ([place])   . HLD (hyperlipidemia)     Social History   Social History  . Marital Status: Widowed    Spouse Name: N/A  . Number of Children: N/A  . Years of Education: N/A   Social History Main Topics  . Smoking status: Former Smoker -- 1.50 packs/day for 39 years    Types: Cigarettes    Quit date: 11/10/2014  . Smokeless tobacco: Never Used  . Alcohol  Use: No     Comment: 11/15/2014 "might have a few drinks/yr"  . Drug Use: Yes    Special: Marijuana     Comment: last marijuana use 03/18/2015  . Sexual Activity: Not Asked   Other Topics Concern  . None   Social History Narrative    ECHO:Study Conclusions--06/12/15  - Left ventricle: The cavity size was normal. Systolic function was normal. The estimated ejection fraction was in the range of 55% to 60%. Wall motion was normal; there were no regional wall motion abnormalities. Features are consistent with a pseudonormal left ventricular filling pattern, with concomitant abnormal relaxation and increased filling pressure (grade 2 diastolic dysfunction). - Mitral valve:  Calcified annulus. There was mild regurgitation. - Left atrium: The atrium was mildly dilated. - Atrial septum: There was increased thickness of the septum, consistent with lipomatous hypertrophy. - Tricuspid valve: There was mild regurgitation.  Transthoracic echocardiography. M-mode, complete 2D, spectral Doppler, and color Doppler. Birthdate: Patient birthdate: 10-03-1960. Age: Patient is 54 yr old. Sex: Gender: female. BMI: 37.7 kg/m^2. Blood pressure:   147/73 Patient status: Inpatient. Study date: Study date: 06/12/2015. Study time: 05:23 PM. Location: Echo laboratory.  BNP    Component Value Date/Time   BNP 609.2* 06/12/2015 [id number]    ProBNP No results found for: PROBNP   Education Assessment and Provision:  Detailed education and instructions provided on heart failure disease management including the following:  Signs and symptoms of Heart Failure When to call the physician Importance of daily weights Low sodium diet Fluid restriction Medication management Anticipated future follow-up appointments  Patient education given on each of the above topics.  Patient acknowledges understanding and acceptance of all instructions.  I spoke with Ms. Wey about her HF and HF recommendations for home.  She admits that she does not have a scale and cannot afford one.  I will provide a scale for her home use.  We discussed the importance of daily weights and when to contact the physician related to signs and symptoms.  We discussed a low sodium diet and high sodium foods to avoid.  She admits that she adds salt to food at times.  I have discouraged this practice.  She denies any issues with getting or taking prescribed medications.  She has asked physician for a cardiac rehab referral and is interested in making changes necessary for her health.  I have encouraged her to call with concerns or questions related to her HF after discharge.  She will follow-up  with [facility].  Education Materials:  "Living Better With Heart Failure" Booklet, Daily Weight Tracker Tool    High Risk Criteria for Readmission and/or Poor Patient Outcomes:   EF <30%- No-55-60%  2 or more admissions in 6 months- 2/6  Difficult social situation- yes--financial constraints  Demonstrates medication noncompliance- No denies    Barriers of Care:  Knowledge, compliance and finances  Discharge Planning:   Plans ot return to home with children

## 2015-06-15 NOTE — Discharge Summary (Signed)
Pt got discharged to home, discharge instructions provided and patient showed understanding to it, IV taken out,Telemonitor DC,pt left unit in wheelchair with all of the belongings accompanied with her sister.

## 2015-06-15 NOTE — Telephone Encounter (Signed)
TOC per Joellen Jersey w/ Katie on 12/21 @ 9:30

## 2015-06-15 NOTE — Progress Notes (Signed)
ReDS Vest Discharge Study  Your patient is in the Blinded arm of the Vest at Discharge study.  Your patient has had a ReDS Vest reading and the reading has been transmitted to the cloud.  Your patient is ok for discharge.    Thank You   The research team   

## 2015-06-16 ENCOUNTER — Telehealth: Payer: Self-pay

## 2015-06-16 NOTE — Telephone Encounter (Signed)
Transitional Care Clinic Post-discharge Follow-Up Phone Call:  Date of Discharge: 06/15/15 Principal Discharge Diagnosis(es): Acute diastolic heart failure Post-discharge Communication: Attempt #2 to reach patient and complete post-discharge follow-up phone call.  Call placed to home number [phone]). Unable to reach patient or leave message as there was a recording that said "please call later." In addition, call placed to mobile number [phone]).  Unable to reach patient.  Unable to leave a voicemail as there was a recording that stated the "person you are calling cannot accept calls at this time."  Transitional Care Case Manager will try to reach patient again at a later time. Call completed: No

## 2015-06-16 NOTE — Telephone Encounter (Signed)
Transitional Care Clinic Post-discharge Follow-Up Phone Call:  Date of Discharge: 06/15/15 Principal Discharge Diagnosis(es): Acute diastolic heart failure Post-discharge Communication: Attempt #1 to reach patient and complete post-discharge follow-up phone call.  Call placed to patient's mobile [phone]). No voicemail to leave message requesting return call.  There was a message that indicated the "person you are calling cannot accept calls at this time."  In addition placed call to home number [phone]).  Also no voicemail to leave message.   Call completed: No

## 2015-06-19 ENCOUNTER — Telehealth: Payer: Self-pay

## 2015-06-19 ENCOUNTER — Other Ambulatory Visit: Payer: Self-pay | Admitting: Family Medicine

## 2015-06-19 NOTE — Telephone Encounter (Signed)
Transitional Care Clinic Post-discharge Follow-Up Phone Call:  Date of Discharge:06/15/2015 Principal Discharge Diagnosis(es):  Acute diastolic heart failure.  Post-discharge Communication: Call placed to the patient Call Completed: Yes                   With Whom: Patient Interpreter Needed: No     Please check all that apply:  X Patient is knowledgeable of his/her condition(s) and/or treatment. X Patient is caring for self at home. Lives with family. ? Patient is receiving assist at home from family and/or caregiver. Family and/or caregiver is knowledgeable of patient's condition(s) and/or treatment. ? Patient is receiving home health services. If so, name of agency.     Medication Reconciliation:  ? Medication list reviewed with patient. X Patient obtained all discharge medications. She said that she has all of her medications and is taking them as ordered; but will soon be out of lasix and potassium and needs to pick up the refills. .  She said that she did not have her medication list with her when this CM called and she did not have any questions about her medications.  Reviewed her discharge instruction to stop taking the ibuprofen and she stated that she doesn't take any. Instructed her to bring her medications to her appointment with Dr Jarold Song on 12/127 and she stated that she understood.    Activities of Daily Living:  X Independent ? Needs assist (describe; ? home DME used) ? Total Care (describe, ? home DME used)   Community resources in place for patient:  X None  ? Home Health/Home DME ? Assisted Living ? Support Group          Patient Education: Discussed the social work services provided at [facility].  Also discussed food stamp eligibility.  She said that her daughter applied for food stamps for herself and her children; but the patient has not applied for food stamps. She said that she was not eligible when she lived in [place] but was told that she may be eligible  in [place],so she will follow up with DSS when she is feeling better. She confirmed that she has the information about free meals and food pantries in [place].   She said that she is checking her blood sugars in the morning before she eats and then a " couple of times during the day."  She reported that her blood sugars have been running between 97-115.          Questions/Concerns discussed: She stated that she is aware of her appointment at the [facility] on 06/27/15@ 1100.  She stated that she did not have any questions/problems at at this time; but has the phone # for the clinic if any concerns arise.

## 2015-06-19 NOTE — Progress Notes (Signed)
Cardiology Office Note   Date:  06/21/2015   ID:  [name], DOB 1960-12-12, MRN [id number]  PCP:  [name], MD  Cardiologist:  Dr. Audrie Lia hospital follow up: diastolic CHF   History of Present Illness: Jacqueline Munoz is a 54 y.o. female with a history of CAD: NSTEMI s/p DES x2 LAD and DESx1 to bifurcation of OM1 and mid to distal LCx (11/16/14); [place] s/p overlapping DES x1 to LAD for de novo 99% focal lesion in LAD (12/02/2014); known CTO of RCA, HLD, HTN, DMT2, previous tobacco abuse, OSA not on CPAP, hypothyroidism, pulmonary nodules and fibromyalgia who presents to clinic for post hospital follow up.  She was first admitted in 10/2014 for chest pain and found to have NSTEMI (troponin max 1.84). Underwent cardiac cath 5/18 and noted to have severe 3 vessel obstructive coronary artery disease w/ stenting of the mid LAD done with overlapping DES x2 as well as stenting of bifurcation of first OM and mid to distal left circumflex with DES x1. She has an chronic totally occluded RCA. She presented back to the hospital 11/2015 with unstable angina and underwent repeat catheterization. This revealed patent stents and a de novo 99% focal lesion in LAD proximal to the previously placed overlapping stents, and a third overlapping DES stent was placed. 2D ECHO with normal LV function EF 60% and RWMAs. Previous ECHO in 10/2014 with G1DD.   She has been followed by Dr. Ellyn Hack as an outpatient and was converted from Brilinta to Plavix recently for financial concerns. Of note, CT Chest showed multiple calcified and noncalcified nodules in right lung. Largest is 6 mm. Likely represent calcified or noncalcified granuloma. Recommend followup chest CT in 6-12 months.  She was most recently admitted to [facility] from 12/12-12/15/16 for new onset acute diastolic CHF. She presented with worsening SOB, orthopnea and PND which was new for her. She was noted to have s/s volume overload as well as elevated  cardiac enzymes. However, her symptoms were unlike her previous anginal chest pain. Her troponin remained low and flat c/w demand ischemia and IV heparin discontinued. She was diuresed with IV lasix ~6 L and 9 lbs. Repeat 2D ECHO 06/12/15 showed normal LVEF and G2DD. Discharge weight 197. She was sent home with a scale to perform daily weights. She was converted to PO lasix '40mg'$  qd as needed for weight gain and discharged on 06/15/15. Of note, she has an ENT laser surgery planned to help with her airway issues which was felt to possibly be contributing to her SHOB/DOE sensation.  Today she presents to clinic for post hospital follow up. Yesterday she felt very fatigued- thinks she got a virus. Her breathing is much better. She has been taking the lasix '40mg'$  everyday. She has had no chest pain. She has been having family issues. She is going to sit down with everyone and talk to them about her own limitations. No LE edema, she sleeps on 3 pillows chronically. No PND. She has been weighing daily, weight stable. She does have chronic pain. This causes her BP to go up and her to become tachycardic. Dr. Irish Lack said she could take percocet for pain. She follows with pain management.     Past Medical History  Diagnosis Date  . Fibromyalgia   . History of tobacco abuse   . Hypothyroid   . OSA (obstructive sleep apnea)     a. does not wear CPAP  . History of stomach ulcers   .  Arthritis   . Chronic back pain   . Chronic depression   . Kidney stones   . Essential hypertension   . HLD (hyperlipidemia)   . CAD S/P percutaneous coronary angioplasty     a. NSTEMI: s/p DES x2 LAD and DES to bifurcation of OM1 and mid to distal LCx (11/16/14) b. UA/ACS:  overlapping DES x1 to LAD for de novo 99% focal lesion in LAD (12/02/2014)  . Coronary artery chronic total occlusion: 100% RCA CTO 11/16/2014    a. Prox RCA to Mid RCA lesion, 100% stenosed.   . Type IVa MI - Peri-PCI 11/17/2014    Prolonged ischemia during  bifurcation PCI of  m-d Cx-OM1 with minicrush technique  . Diabetes mellitus, type 2 ([place])   . HLD (hyperlipidemia)     Past Surgical History  Procedure Laterality Date  . Cesarean section  2000  . Inner ear surgery Bilateral "several"  . Tonsillectomy    . Total abdominal hysterectomy  ~ 2008  . Tubal ligation  2000  . Cardiac catheterization N/A 11/16/2014    Procedure: Left Heart Cath and Coronary Angiography;  Surgeon: Peter M Martinique, MD;  Location: [place] [facility];  Service: Cardiovascular;  RCA 100% CTO, m-dLAD 90%, m-dCx 95%, OM1 90%  . Cardiac catheterization  11/16/2014    Procedure: Coronary Stent Intervention;  Surgeon: Peter M Martinique, MD;  Location: [place] [facility];  Service: Cardiovascular;;m-d LAD overlapping 3.0 x 16 & 2.75 x 16 Promus P DES; OM1-dCx Minicrush bifurcation (dCx 2.25 x 38 Synergy DES crushed with 2.75 x 24 Synergy DES in OM1)   . Cardiac catheterization N/A 12/02/2014    Procedure: Left Heart Cath and Coronary Angiography;  Surgeon: Leonie Man, MD;  Location: [place] [facility];  Service: Cardiovascular;  Previous Stents in LAD & Cx-OM patent, mLAD prox to stents 99%  . Cardiac catheterization N/A 12/02/2014    Procedure: Coronary Stent Intervention;  Surgeon: Leonie Man, MD;  Location: [place] [facility];  Service: Cardiovascular;  3rd overlapping (prox) mLAD Promus Premier DES 3.0 x 20 (3.25 mm)  . Transthoracic echocardiogram  5/19 & 6/ 2016    a) EF 65-70%, mild LVH, Gr 1 DD; b) EF 60% -ron RWMA, no valve lesions     Current Outpatient Prescriptions  Medication Sig Dispense Refill  . aspirin EC 81 MG EC tablet Take 1 tablet (81 mg total) by mouth daily. 30 tablet 0  . atorvastatin (LIPITOR) 80 MG tablet Take 1 tablet (80 mg total) by mouth daily at 6 PM. 30 tablet 2  . carvedilol (COREG) 3.125 MG tablet Take 1 tablet (3.125 mg total) by mouth 2 (two) times daily with a meal. 60 tablet 2  . clopidogrel (PLAVIX) 75 MG tablet Take 1 tablet (75 mg  total) by mouth daily. 30 tablet 2  . Elastic Bandages & Supports (MEDICAL COMPRESSION STOCKINGS) MISC Wear daily 30 mm Hg pressure 2 each 0  . furosemide (LASIX) 40 MG tablet Take 1 tablet (40 mg total) by mouth daily. 30 tablet 6  . glucose monitoring kit (FREESTYLE) monitoring kit 1 each by Does not apply route 4 (four) times daily - after meals and at bedtime. 1 month Diabetic Testing Supplies for QAC-QHS accuchecks. 1 each 1  . Insulin Detemir (LEVEMIR FLEXPEN) 100 UNIT/ML Pen Inject 35 Units into the skin at bedtime. 40 mL 3  . Insulin Syringe-Needle U-100 (TRUEPLUS INSULIN SYRINGE) 30G X 5/16" 0.5 ML MISC 1 each by  Does not apply route 4 (four) times daily. 100 each 11  . isosorbide mononitrate (IMDUR) 30 MG 24 hr tablet Take 1 tablet (30 mg total) by mouth daily. 30 tablet 6  . levothyroxine (SYNTHROID, LEVOTHROID) 200 MCG tablet Take 1 tablet (200 mcg total) by mouth daily before breakfast. 30 tablet 6  . lisinopril (PRINIVIL,ZESTRIL) 10 MG tablet Take 10 mg by mouth daily.    . meclizine (ANTIVERT) 25 MG tablet Take 1 tablet (25 mg total) by mouth 2 (two) times daily as needed for dizziness. 60 tablet 3  . metFORMIN (GLUCOPHAGE) 500 MG tablet Take 1 tablet (500 mg total) by mouth 2 (two) times daily with a meal. 60 tablet 5  . nitroGLYCERIN (NITROSTAT) 0.4 MG SL tablet Place 0.4 mg under the tongue every 5 (five) minutes as needed for chest pain (may take three doses by mouth per day as needed).    [name] oxyCODONE-acetaminophen (PERCOCET/ROXICET) 5-325 MG tablet Take 1-2 tablets by mouth every 4 (four) hours as needed for severe pain. 30 tablet 0  . potassium chloride SA (K-DUR,KLOR-CON) 20 MEQ tablet Take 1 tablet (20 mEq total) by mouth daily. 30 tablet 6   No current facility-administered medications for this visit.    Allergies:   Corticosteroids and Penicillins    Social History:  The patient  reports that she quit smoking about 7 months ago. Her smoking use included Cigarettes. She  has a 58.5 pack-year smoking history. She has never used smokeless tobacco. She reports that she uses illicit drugs (Marijuana). She reports that she does not drink alcohol.   Family History:  The patient's family history includes Emphysema in her maternal grandmother; Hyperlipidemia in her mother; Sudden death in her father.    ROS:  Please see the history of present illness.   Otherwise, review of systems are positive for NONE.   All other systems are reviewed and negative.    PHYSICAL EXAM: VS:  BP 122/78 mmHg  Pulse 78  Ht '5\' 2"'$  (1.575 m)  Wt 196 lb 6.4 oz (89.086 kg)  BMI 35.91 kg/m2 , BMI Body mass index is 35.91 kg/(m^2). GEN: Well nourished, well developed, in no acute distressobese HEENT: normal Neck: no JVD, carotid bruits, or masses Cardiac: RRR; no murmurs, rubs, or gallops,no edema  Respiratory:  clear to auscultation bilaterally, normal work of breathing GI: soft, nontender, nondistended, + BS MS: no deformity or atrophy Skin: warm and dry, no rash Neuro:  Strength and sensation are intact Psych: euthymic mood, full affect   EKG:  EKG is ordered today. This show NSR HR 78.     Recent Labs: 12/02/2014: ALT 12* 06/12/2015: B Natriuretic Peptide 609.2*; TSH 8.157* 06/14/2015: Hemoglobin 13.1; Platelets 154 06/15/2015: BUN 10; Creatinine, Ser 0.69; Potassium 4.4; Sodium 137    Lipid Panel    Component Value Date/Time   CHOL 135 04/21/2015 0939   TRIG 346* 04/21/2015 0939   HDL 25* 04/21/2015 0939   CHOLHDL 5.4 04/21/2015 0939   VLDL 69* 04/21/2015 0939   LDLCALC 41 04/21/2015 0939   LDLDIRECT 56 12/02/2014 0450      Wt Readings from Last 3 Encounters:  06/21/15 196 lb 6.4 oz (89.086 kg)  06/15/15 197 lb 11.2 oz (89.676 kg)  04/21/15 192 lb 12.8 oz (87.454 kg)      Other studies Reviewed: Additional studies/ records that were reviewed today include: 2D ECHO. Review of the above records demonstrates:   2D ECHO: 06/12/2015 LV EF: 55% -  60% Study  Conclusions - Left ventricle: The cavity size was normal. Systolic function was normal. The estimated ejection fraction was in the range of 55% to 60%. Wall motion was normal; there were no regional wall motion abnormalities. Features are consistent with a pseudonormal left ventricular filling pattern, with concomitant abnormal relaxation and increased filling pressure (grade 2 diastolic dysfunction). - Mitral valve: Calcified annulus. There was mild regurgitation. - Left atrium: The atrium was mildly dilated. - Atrial septum: There was increased thickness of the septum, consistent with lipomatous hypertrophy. - Tricuspid valve: There was mild regurgitation.    ASSESSMENT AND PLAN:  Jacqueline Munoz is a 54 y.o. female with a history of CAD: NSTEMI s/p DES x2 LAD and DESx1 to bifurcation of OM1 and mid to distal LCx (11/16/14); [place] s/p overlapping DES x1 to LAD for de novo 99% focal lesion in LAD (12/02/2014); known CTO of RCA, HLD, HTN, DMT2, previous tobacco abuse, OSA not on CPAP, hypothyroidism, pulmonary nodules and fibromyalgia who presents to clinic for post hospital follow up.  Chronic diastolic CHF:  2D ECHO (16/10/96) w/ normal LVEF and G2DD. -- Continue lasix '40mg'$  po qd. She appears euvolemic. Weigh stable. Will repeat BMET today to check creat and K.  -- She has an ENT laser surgery planned to help with her airway issues. This may also be contributing to her SHOB/DOE sensation. -- Low sodium diet, fluid restriction and daily weights discussed.   CAD: NSTEMI s/p DES x2 LAD and DESx1 to bifurcation of OM1 and mid to distal LCx (11/16/14); [place] s/p overlapping DES x1 to LAD for de novo 99% focal lesion in LAD (12/02/2014); known CTO of RCA -- Continue ASA, statin and BB.  HTN: BP w/ good control. Continue coreg 3.'125mg'$  BID, lisinopril '10mg'$  po qd and imdur '30mg'$  po qd  DM: continue home regimen  Hypothyroidism- TSH 8.157. Levothyroxine increased from 183mg to '200mg'$ . She  will f/up with her PCP  Pulmonary nodules: CT Chest (04/20/15) showed multiple calcified and noncalcified nodules in right lung. Largest is 6 mm. Likely represent calcified or noncalcified granuloma. Recommend followup chest CT in 9-12 months. Due to get repeat in 12/2015.   Chronic pain: she is followed by pain management. Her chronic pain exacerbates her cardiac sx and Dr. VIrish Lacktold her that she could take her percocet as prescribed. She has a pain appt next Tuesday but is out of pain meds. I will give her a very short Rx to get her through that time.   Current medicines are reviewed at length with the patient today.  The patient does not have concerns regarding medicines.  The following changes have been made:  no change  Labs/ tests ordered today include:   Orders Placed This Encounter  Procedures  . EKG 12-Lead     Disposition:   FU with Dr. HEllyn Hackin 6-8 weeks.   SRenea Ee 06/21/2015 9:56 AM    [facility] [address]   [id number]: [phone] Fax: [phone]

## 2015-06-19 NOTE — Telephone Encounter (Signed)
°  New Prob   Pt is calling in requesting pain medication (did not specify what kind). She states she is having a hard time getting in contact with her pain clinic as she is established with [facility]. Please call.

## 2015-06-20 NOTE — Telephone Encounter (Signed)
Unable to reach patient at both numbers provided and numbers do not allow message to be left.

## 2015-06-21 ENCOUNTER — Ambulatory Visit (INDEPENDENT_AMBULATORY_CARE_PROVIDER_SITE_OTHER): Payer: Medicare Other | Admitting: Physician Assistant

## 2015-06-21 ENCOUNTER — Encounter: Payer: Self-pay | Admitting: Physician Assistant

## 2015-06-21 ENCOUNTER — Telehealth: Payer: Self-pay | Admitting: *Deleted

## 2015-06-21 VITALS — BP 122/78 | HR 78 | Ht 62.0 in | Wt 196.4 lb

## 2015-06-21 DIAGNOSIS — I5031 Acute diastolic (congestive) heart failure: Secondary | ICD-10-CM

## 2015-06-21 LAB — BASIC METABOLIC PANEL
BUN: 16 mg/dL (ref 7–25)
CALCIUM: 10.2 mg/dL (ref 8.6–10.4)
CO2: 25 mmol/L (ref 20–31)
Chloride: 95 mmol/L — ABNORMAL LOW (ref 98–110)
Creat: 0.79 mg/dL (ref 0.50–1.05)
GLUCOSE: 242 mg/dL — AB (ref 65–99)
POTASSIUM: 4.1 mmol/L (ref 3.5–5.3)
SODIUM: 136 mmol/L (ref 135–146)

## 2015-06-21 MED ORDER — OXYCODONE-ACETAMINOPHEN 5-325 MG PO TABS: 1.0000 | ORAL_TABLET | ORAL | Status: DC | PRN

## 2015-06-21 NOTE — Patient Instructions (Signed)
Medication Instructions:  Your physician recommends that you continue on your current medications as directed. Please refer to the Current Medication list given to you today.   Labwork: TODAY:  BMET  Testing/Procedures: None ordered  Follow-Up: Your physician recommends that you schedule a follow-up appointment in:  [place] DR. HARDING   Any Other Special Instructions Will Be Listed Below (If Applicable).     If you need a refill on your cardiac medications before your next appointment, please call your pharmacy.

## 2015-06-21 NOTE — Telephone Encounter (Signed)
Called pt to make her aware that her labs were normal and we would not make any changes at this time.  Pt verbalized understanding.

## 2015-06-21 NOTE — Telephone Encounter (Signed)
-----   Message from Eileen Stanford, PA-C sent at 06/21/2015  4:07 PM EST ----- Labs look good. No change in plans.

## 2015-06-22 ENCOUNTER — Telehealth: Payer: Self-pay

## 2015-06-22 NOTE — Telephone Encounter (Signed)
This Case Manager placed call to check status and to remind patient of upcoming [facility] appointment on 06/27/15 at 1100 with Dr. Jarold Song. Call placed to [phone]; however, recording indicated the "person you are calling cannot accept calls at this time." Also placed call to [phone]; unable to reach patient. No voicemail available to leave message.  Will attempt to follow-up with patient again at a later time.

## 2015-06-23 ENCOUNTER — Other Ambulatory Visit: Payer: Self-pay | Admitting: Family Medicine

## 2015-06-23 ENCOUNTER — Telehealth: Payer: Self-pay

## 2015-06-23 DIAGNOSIS — I10 Essential (primary) hypertension: Secondary | ICD-10-CM

## 2015-06-23 MED ORDER — CARVEDILOL 3.125 MG PO TABS: 3.1250 mg | ORAL_TABLET | Freq: Two times a day (BID) | ORAL | Status: DC

## 2015-06-23 NOTE — Telephone Encounter (Signed)
This Case Manager placed call to patient to patient to check on status and to remind patient of initial [facility] appointment on 06/27/15 at 1100 with Dr. Jarold Song. Patient aware of appointment and indicated she will have transportation to her appointment. Also inquired about patient's status. Patient had no complaints or concerns for her health.  Patient indicated she was weighing herself daily. Informed patient to keep a weight log and bring log to her appointment on 06/27/15. Also educated patient on the importance of adhering to a low sodium diet, and patient indicated she was being compliant with a low sodium diet. Patient indicated she was not checking her blood sugar with meals and before bed. She said she usually checks her blood sugar twice daily, and her blood glucose ranges from 90-120. Instructed patient to bring blood glucose log to her appointment.  Patient verbalized understanding. Patient also indicated she was taking all medications as prescribed but was out of coreg. Took last dose last night. She requested a refill be sent to [facility] on [address].  Medication refilled per protocol. Patient updated and appreciative.

## 2015-06-27 ENCOUNTER — Ambulatory Visit: Payer: Medicare Other | Attending: Family Medicine | Admitting: Family Medicine

## 2015-06-27 ENCOUNTER — Encounter: Payer: Self-pay | Admitting: Family Medicine

## 2015-06-27 ENCOUNTER — Encounter (HOSPITAL_BASED_OUTPATIENT_CLINIC_OR_DEPARTMENT_OTHER): Payer: Medicare Other | Admitting: Clinical

## 2015-06-27 VITALS — BP 136/85 | HR 72 | Temp 98.9°F | Resp 13 | Ht 62.0 in | Wt 195.6 lb

## 2015-06-27 DIAGNOSIS — G894 Chronic pain syndrome: Secondary | ICD-10-CM | POA: Diagnosis not present

## 2015-06-27 DIAGNOSIS — I5031 Acute diastolic (congestive) heart failure: Secondary | ICD-10-CM | POA: Insufficient documentation

## 2015-06-27 DIAGNOSIS — F419 Anxiety disorder, unspecified: Principal | ICD-10-CM

## 2015-06-27 DIAGNOSIS — I10 Essential (primary) hypertension: Secondary | ICD-10-CM | POA: Insufficient documentation

## 2015-06-27 DIAGNOSIS — Z79899 Other long term (current) drug therapy: Secondary | ICD-10-CM | POA: Insufficient documentation

## 2015-06-27 DIAGNOSIS — Z87891 Personal history of nicotine dependence: Secondary | ICD-10-CM | POA: Diagnosis not present

## 2015-06-27 DIAGNOSIS — T387X5A Adverse effect of androgens and anabolic congeners, initial encounter: Secondary | ICD-10-CM | POA: Diagnosis not present

## 2015-06-27 DIAGNOSIS — I2510 Atherosclerotic heart disease of native coronary artery without angina pectoris: Secondary | ICD-10-CM | POA: Insufficient documentation

## 2015-06-27 DIAGNOSIS — R0602 Shortness of breath: Secondary | ICD-10-CM | POA: Insufficient documentation

## 2015-06-27 DIAGNOSIS — R748 Abnormal levels of other serum enzymes: Secondary | ICD-10-CM | POA: Insufficient documentation

## 2015-06-27 DIAGNOSIS — B3731 Acute candidiasis of vulva and vagina: Secondary | ICD-10-CM

## 2015-06-27 DIAGNOSIS — B373 Candidiasis of vulva and vagina: Secondary | ICD-10-CM | POA: Diagnosis not present

## 2015-06-27 DIAGNOSIS — M79606 Pain in leg, unspecified: Secondary | ICD-10-CM | POA: Diagnosis not present

## 2015-06-27 DIAGNOSIS — Z955 Presence of coronary angioplasty implant and graft: Secondary | ICD-10-CM | POA: Diagnosis not present

## 2015-06-27 DIAGNOSIS — Z7982 Long term (current) use of aspirin: Secondary | ICD-10-CM | POA: Diagnosis not present

## 2015-06-27 DIAGNOSIS — Z7984 Long term (current) use of oral hypoglycemic drugs: Secondary | ICD-10-CM | POA: Insufficient documentation

## 2015-06-27 DIAGNOSIS — F418 Other specified anxiety disorders: Secondary | ICD-10-CM

## 2015-06-27 DIAGNOSIS — F329 Major depressive disorder, single episode, unspecified: Secondary | ICD-10-CM

## 2015-06-27 DIAGNOSIS — X58XXXA Exposure to other specified factors, initial encounter: Secondary | ICD-10-CM | POA: Diagnosis not present

## 2015-06-27 DIAGNOSIS — G8929 Other chronic pain: Secondary | ICD-10-CM | POA: Diagnosis not present

## 2015-06-27 DIAGNOSIS — M1288 Other specific arthropathies, not elsewhere classified, other specified site: Secondary | ICD-10-CM | POA: Diagnosis not present

## 2015-06-27 DIAGNOSIS — E119 Type 2 diabetes mellitus without complications: Secondary | ICD-10-CM | POA: Diagnosis not present

## 2015-06-27 DIAGNOSIS — E039 Hypothyroidism, unspecified: Secondary | ICD-10-CM | POA: Diagnosis not present

## 2015-06-27 DIAGNOSIS — M545 Low back pain: Secondary | ICD-10-CM | POA: Diagnosis not present

## 2015-06-27 DIAGNOSIS — Z794 Long term (current) use of insulin: Secondary | ICD-10-CM | POA: Insufficient documentation

## 2015-06-27 LAB — POCT GLYCOSYLATED HEMOGLOBIN (HGB A1C): HEMOGLOBIN A1C: 7.3

## 2015-06-27 LAB — BASIC METABOLIC PANEL
BUN: 15 mg/dL (ref 7–25)
CO2: 28 mmol/L (ref 20–31)
Calcium: 10.5 mg/dL — ABNORMAL HIGH (ref 8.6–10.4)
Chloride: 97 mmol/L — ABNORMAL LOW (ref 98–110)
Creat: 0.79 mg/dL (ref 0.50–1.05)
GLUCOSE: 166 mg/dL — AB (ref 65–99)
POTASSIUM: 5.5 mmol/L — AB (ref 3.5–5.3)
SODIUM: 133 mmol/L — AB (ref 135–146)

## 2015-06-27 LAB — GLUCOSE, POCT (MANUAL RESULT ENTRY): POC GLUCOSE: 219 mg/dL — AB (ref 70–99)

## 2015-06-27 MED ORDER — FLUCONAZOLE 150 MG PO TABS: 150.0000 mg | ORAL_TABLET | Freq: Once | ORAL | Status: DC

## 2015-06-27 NOTE — Progress Notes (Signed)
ASSESSMENT: Pt currently experiencing symptoms of anxiety and depression. Pt needs to f/u with PCP and [facility], as well as continuing with psychiatry;  would benefit from community resources and supportive counseling regarding coping with symptoms of anxiety and depression.  Stage of Change: contemplative  PLAN: 1. F/U with behavioral health consultant in as needed, consider appointment at next PCP visit 2. Psychiatric Medications: none 3. Behavioral recommendation(s):   -Reschedule last [facility] appointment or go to walk-in clinic -Consider discussion with mother about moving to [facility], to help take care of one another -Consider applying for Low-Income Energy Assistance Program(LIEAP) to help with power bill this coming winter  SUBJECTIVE: Pt. referred by Dr Jarold Song for symptoms of anxiety and depression:  Pt. reports the following symptoms/concerns: Pt states that she does feel a little better because she is going to [place] (missed last appointment), as has one daughter. Pt says she was having difficulty doing daily chores, but that her mother has come to her house to help take care of her temporarily, to get her caught up on housework. Since last visit with [facility] in May, pt has moved to [place], which has helped with transportation to medical appointments. Primary concern is hot flashes, thinks they may be anxiety-related; also finances causing anxiety.  Duration of problem: about two weeks (most recent) Severity: moderate-severe  OBJECTIVE: Orientation & Cognition: Oriented x3. Thought processes normal and appropriate to situation. Mood: appropriate. Affect: appropriate Appearance: appropriate Risk of harm to self or others: no known risk of harm to self or others Substance use: tobacco Assessments administered: PHQ9: 20/ GAD7: 16  Diagnosis: Anxiety and depression CPT Code: F41.8 -------------------------------------------- Other(s) present in the room: mother  Time spent with  patient in exam room: 16 minutes

## 2015-06-27 NOTE — Patient Instructions (Signed)

## 2015-06-27 NOTE — Progress Notes (Signed)
Patient here for hfu on her CHF She did bring her weight  And CBG log from home She did see her pain clinic today She would like medication for a yeast infection

## 2015-06-27 NOTE — Progress Notes (Signed)
Transitional care clinic  Date of telephone encounter: 06/16/15, 06/19/15  Admit Date: 06/12/15 Discharge Date: 06/15/15  PCP: Dr Adrian Blackwater   Subjective:  Patient ID: Jacqueline Munoz, female    DOB: Jan 31, 1961  Age: 54 y.o. MRN: [id number]  CC: Hospitalization Follow-up   HPI Jacqueline Munoz is a 54 year old female with a history of controlled Type 2 Diabetes Mellitus (A1c 7.3), Hypertension, CAD (s/p multiple stents), Hypothyroidism, chronic pain, previous smoker recently hospitalized at [facility] for newly diagnosed acute diastolic CHF.  She had presented to [facility] ED with Shortness of breath , orthopnea, paroxysmal nocturnal dyspnea and was found to have elevated troponins (0.16, 0.47, 0.53, 0.32) which were thought to be secondary to demand ischemia. EKG revealed NSR (notes indicate the patient declined a cardiac cath) CXR revealed vascular congestion and mild cardiomegaly with increased interstitial markings concerning for pulmonary edema. She was placed on IV diureses and diuresed about 9 lbs; 2d echo revealed EF of 55-60%, no regional wall motion abnormalities, grade 2 diastolic dysfunction, mild MR. Her condition improved and she was subsequently discharged.  Today she reports doing well. She is requesting a treatment for a yeast infection as she has some vaginal itching and whitish discharge. Also requests a referral to an allergist to confirm steroid allergy as this is being requested by pain management whom she sees for chronic pain. Her weight log reviewed and indicates her highest weight has been 200.2 ans lowest 191. She is scheduled to see ENT next year for a laser procedure for voice hoarseness and sees cardiology for a follow up visit in 08/2015  Outpatient Prescriptions Prior to Visit  Medication Sig Dispense Refill  . aspirin EC 81 MG EC tablet Take 1 tablet (81 mg total) by mouth daily. 30 tablet 0  . atorvastatin (LIPITOR) 40 MG tablet TAKE 2 TABLETS BY MOUTH  DAILY AT 6 PM. 60 tablet 2  . carvedilol (COREG) 3.125 MG tablet Take 1 tablet (3.125 mg total) by mouth 2 (two) times daily with a meal. 60 tablet 0  . clopidogrel (PLAVIX) 75 MG tablet Take 1 tablet (75 mg total) by mouth daily. 30 tablet 2  . Elastic Bandages & Supports (MEDICAL COMPRESSION STOCKINGS) MISC Wear daily 30 mm Hg pressure 2 each 0  . furosemide (LASIX) 40 MG tablet Take 1 tablet (40 mg total) by mouth daily. 30 tablet 6  . glucose monitoring kit (FREESTYLE) monitoring kit 1 each by Does not apply route 4 (four) times daily - after meals and at bedtime. 1 month Diabetic Testing Supplies for QAC-QHS accuchecks. 1 each 1  . Insulin Detemir (LEVEMIR FLEXPEN) 100 UNIT/ML Pen Inject 35 Units into the skin at bedtime. 40 mL 3  . Insulin Syringe-Needle U-100 (TRUEPLUS INSULIN SYRINGE) 30G X 5/16" 0.5 ML MISC 1 each by Does not apply route 4 (four) times daily. 100 each 11  . isosorbide mononitrate (IMDUR) 30 MG 24 hr tablet Take 1 tablet (30 mg total) by mouth daily. 30 tablet 6  . levothyroxine (SYNTHROID, LEVOTHROID) 200 MCG tablet Take 1 tablet (200 mcg total) by mouth daily before breakfast. 30 tablet 6  . lisinopril (PRINIVIL,ZESTRIL) 10 MG tablet Take 10 mg by mouth daily.    . meclizine (ANTIVERT) 25 MG tablet Take 1 tablet (25 mg total) by mouth 2 (two) times daily as needed for dizziness. 60 tablet 3  . metFORMIN (GLUCOPHAGE) 500 MG tablet Take 1 tablet (500 mg total) by mouth 2 (two) times daily with a  meal. 60 tablet 5  . nitroGLYCERIN (NITROSTAT) 0.4 MG SL tablet Place 0.4 mg under the tongue every 5 (five) minutes as needed for chest pain (may take three doses by mouth per day as needed).    [name] oxyCODONE-acetaminophen (PERCOCET/ROXICET) 5-325 MG tablet Take 1-2 tablets by mouth every 4 (four) hours as needed for severe pain. 30 tablet 0  . potassium chloride SA (K-DUR,KLOR-CON) 20 MEQ tablet Take 1 tablet (20 mEq total) by mouth daily. 30 tablet 6   No facility-administered  medications prior to visit.    ROS Review of Systems  Constitutional: Negative for activity change, appetite change and fatigue.  HENT: Negative for congestion, sinus pressure and sore throat.   Eyes: Negative for visual disturbance.  Respiratory: Negative for cough, chest tightness, shortness of breath and wheezing.   Cardiovascular: Negative for chest pain and palpitations.  Gastrointestinal: Negative for abdominal pain, constipation and abdominal distention.  Endocrine: Negative for polydipsia.  Genitourinary: Positive for vaginal discharge. Negative for dysuria and frequency.  Musculoskeletal: Negative for back pain and arthralgias.  Skin: Negative for rash.  Neurological: Negative for tremors, light-headedness and numbness.  Hematological: Does not bruise/bleed easily.  Psychiatric/Behavioral: Negative for behavioral problems and agitation.    Objective:  BP 136/85 mmHg  Pulse 72  Temp(Src) 98.9 F (37.2 C)  Resp 13  Ht _0  (1.575 m)  Wt 195 lb 9.6 oz (88.724 kg)  BMI 35.77 kg/m2  SpO2 97%  BP/Weight 06/27/2015 06/21/2015 38/17/7116  Systolic BP 579 038 333  Diastolic BP 85 78 83  Wt. (Lbs) 195.6 196.4 197.7  BMI 35.77 35.91 36.15    Lab Results  Component Value Date   WBC 6.3 06/14/2015   HGB 13.1 06/14/2015   HCT 39.5 06/14/2015   PLT 154 06/14/2015   GLUCOSE 242* 06/21/2015   CHOL 135 04/21/2015   TRIG 346* 04/21/2015   HDL 25* 04/21/2015   LDLDIRECT 56 12/02/2014   LDLCALC 41 04/21/2015   ALT 12* 12/02/2014   AST 15 12/02/2014   NA 136 06/21/2015   K 4.1 06/21/2015   CL 95* 06/21/2015   CREATININE 0.79 06/21/2015   BUN 16 06/21/2015   CO2 25 06/21/2015   TSH 8.157* 06/12/2015   INR 0.94 12/02/2014   HGBA1C 7.30 06/27/2015   MICROALBUR 3.3* 02/02/2015   Wt Readings from Last 3 Encounters:  06/27/15 195 lb 9.6 oz (88.724 kg)  06/21/15 196 lb 6.4 oz (89.086 kg)  06/15/15 197 lb 11.2 oz (89.676 kg)    Physical Exam  Constitutional: She is  oriented to person, place, and time. She appears well-developed and well-nourished. No distress.  HENT:  Head: Normocephalic.  Right Ear: External ear normal.  Left Ear: External ear normal.  Nose: Nose normal.  Mouth/Throat: Oropharynx is clear and moist.  Neck: Normal range of motion. No JVD present.  Cardiovascular: Normal rate, regular rhythm, normal heart sounds and intact distal pulses.  Exam reveals no gallop.   No murmur heard. Pulmonary/Chest: Effort normal and breath sounds normal. No respiratory distress. She has no wheezes. She has no rales. She exhibits no tenderness.  Abdominal: Soft. Bowel sounds are normal. She exhibits no distension and no mass. There is no tenderness.  Musculoskeletal: Normal range of motion. She exhibits no edema or tenderness.  Neurological: She is alert and oriented to person, place, and time. She has normal reflexes.  Skin: Skin is warm and dry. She is not diaphoretic.  Psychiatric: She has a normal mood and affect.  Assessment & Plan:   1. Type 2 diabetes mellitus without complication, unspecified long term insulin use status (HCC) Controlled with A1c of 7.3. Continue medications - Glucose (CBG) - HgB A1c  2. Essential hypertension Controlled. Continue antihypertensives, low-sodium, DASH diet  3. Vaginal candidiasis Treated - fluconazole (DIFLUCAN) 150 MG tablet; Take 1 tablet (150 mg total) by mouth once.  Dispense: 1 tablet; Refill: 0  4. Adverse effect of anabolic steroid, initial encounter Referred this patient request as this will facilitate management of her chronic pain by pain management - Ambulatory referral to Allergy  5. Hypothyroidism, unspecified hypothyroidism type Uncontrolled with elevated TSH but liver thyroxine dose was recently increased during hospitalization so I will make no changes today.  6. Chronic pain syndrome As per pain management  7. Acute diastolic heart failure ([place])  patient is euvolemic at this  time. - Basic Metabolic Panel   Meds ordered this encounter  Medications  . fluconazole (DIFLUCAN) 150 MG tablet    Sig: Take 1 tablet (150 mg total) by mouth once.    Dispense:  1 tablet    Refill:  0    Follow-up: Return in about 2 weeks (around 07/11/2015) for Transitional care clinic-follow-up on CHF.   Arnoldo Morale MD

## 2015-06-28 ENCOUNTER — Telehealth: Payer: Self-pay | Admitting: *Deleted

## 2015-06-28 DIAGNOSIS — E875 Hyperkalemia: Secondary | ICD-10-CM

## 2015-06-28 NOTE — Telephone Encounter (Signed)
-----   Message from Arnoldo Morale, MD sent at 06/28/2015  8:47 AM EST ----- Labs reveal hyperkalemia; could you please order a repeat BMET and schedule the patient for this? Thank you.

## 2015-06-28 NOTE — Telephone Encounter (Signed)
Patient called and name and date of birth verified.  Results given to patient, orders placed and patient transferred to [facility] at the front to schedule future lab work.

## 2015-06-29 ENCOUNTER — Other Ambulatory Visit: Payer: Medicare Other

## 2015-07-06 ENCOUNTER — Telehealth: Payer: Self-pay

## 2015-07-06 NOTE — Telephone Encounter (Signed)
This Case Manager placed call to patient to check on status and to remind patient of need for repeat labwork (BMET) as BMET on 06/27/15 revealed hyperkalemia.  Lab appointment was scheduled for 06/29/15 but patient missed appointment.  Call placed to [phone]; unable to reach patient. HIPPA compliant voicemail left requesting return call. In addition, call placed to home [phone]. Unable to reach patient on number and unable to leave voicemail as recording indicated "please call back later." Awaiting return call from patient.

## 2015-07-07 ENCOUNTER — Telehealth: Payer: Self-pay | Admitting: *Deleted

## 2015-07-07 NOTE — Telephone Encounter (Signed)
Placed call to patient left HIPAA compliant message to return call to RN.  Patient missed lab appointment and needs labs drawn before her next appointment which is on 07/12/15.

## 2015-07-10 ENCOUNTER — Telehealth: Payer: Self-pay

## 2015-07-10 NOTE — Telephone Encounter (Signed)
Call placed to the patient to remind her of her appointment with Dr Jarold Song on 07/13/15 2 1200 and to also remind her of the need for bloodwork to be done prior to her appointment.  She stated that she is aware of the appointment and the need for blood work but she is not sure that she will be able to come to the clinic tomorrow for the blood work because of the weather. She said that she has not left her apartment and is very cautious of the snow and ice. Instructed her to remain cautious about going out with the snow and ice.  Also instructed her to contact the [facility] office if she is having difficulty securing transportation to her appointment and she stated that she would .She said that she has all of her medications and is taking them as ordered. No other problems/questions reported.

## 2015-07-12 ENCOUNTER — Telehealth: Payer: Self-pay

## 2015-07-12 NOTE — Telephone Encounter (Signed)
Attempted to contact the patient to check on her status and to inquire if she needs assistance with transportation to her appointment at [facility] tomorrow, 07/13/15. Call placed to # [phone] (M) and a HIPAA compliant voice mail message was left requesting a call back to # [phone] or [phone].  Call then placed to #  [phone] (H) and the message states to call back later.

## 2015-07-13 ENCOUNTER — Telehealth: Payer: Self-pay

## 2015-07-13 ENCOUNTER — Ambulatory Visit: Payer: Medicare Other | Admitting: Family Medicine

## 2015-07-13 NOTE — Telephone Encounter (Signed)
This Case Manager placed call to patient to remind her of her appointment on 07/13/15 at 1200 and to determine if she needs transportation to her appointment. Call placed to [phone]; unable to reach patient or leave message as recording said to "call back later." In addition, call placed to [phone]; unable to reach patient. Voicemail left requesting return call.

## 2015-07-14 ENCOUNTER — Telehealth: Payer: Self-pay

## 2015-07-14 NOTE — Telephone Encounter (Signed)
This Case Manager placed call to patient to discuss patient's need for blood work (BMET) prior to appointment on 07/19/14.  Call placed to [phone]; unable to reach patient or leave voicemail as recording stated to "please call later."  In addition, call placed to [phone]; unable to reach patient. Voicemail left requesting return call.

## 2015-07-19 ENCOUNTER — Telehealth: Payer: Self-pay

## 2015-07-19 NOTE — Telephone Encounter (Signed)
This Case Manager placed call to patient to check on status and to remind her of upcoming appointment on 07/20/15 at 1000 with Dr. Jarold Song. Call placed to [phone]; unable to reach patient or leave voicemail as recording indicated to "call back later." In addition, call placed to [phone]; unable to reach patient. Voicemail left requesting return call.

## 2015-07-20 ENCOUNTER — Ambulatory Visit
Admission: RE | Admit: 2015-07-20 | Discharge: 2015-07-20 | Disposition: A | Discharge status: Home / Self Care | Payer: Medicare Other | Source: Ambulatory Visit | Attending: Physician Assistant | Admitting: Physician Assistant

## 2015-07-20 ENCOUNTER — Ambulatory Visit: Payer: Medicare Other | Attending: Family Medicine | Admitting: Family Medicine

## 2015-07-20 ENCOUNTER — Encounter: Payer: Self-pay | Admitting: Family Medicine

## 2015-07-20 ENCOUNTER — Other Ambulatory Visit: Payer: Self-pay | Admitting: Physician Assistant

## 2015-07-20 ENCOUNTER — Encounter (HOSPITAL_BASED_OUTPATIENT_CLINIC_OR_DEPARTMENT_OTHER): Payer: Medicare Other | Admitting: Clinical

## 2015-07-20 VITALS — BP 122/83 | HR 90 | Temp 98.8°F | Resp 13 | Ht 62.0 in | Wt 198.8 lb

## 2015-07-20 DIAGNOSIS — I10 Essential (primary) hypertension: Secondary | ICD-10-CM

## 2015-07-20 DIAGNOSIS — G8929 Other chronic pain: Secondary | ICD-10-CM

## 2015-07-20 DIAGNOSIS — F329 Major depressive disorder, single episode, unspecified: Secondary | ICD-10-CM

## 2015-07-20 DIAGNOSIS — F418 Other specified anxiety disorders: Secondary | ICD-10-CM

## 2015-07-20 DIAGNOSIS — Z7982 Long term (current) use of aspirin: Secondary | ICD-10-CM | POA: Insufficient documentation

## 2015-07-20 DIAGNOSIS — Z955 Presence of coronary angioplasty implant and graft: Secondary | ICD-10-CM | POA: Insufficient documentation

## 2015-07-20 DIAGNOSIS — F419 Anxiety disorder, unspecified: Secondary | ICD-10-CM | POA: Insufficient documentation

## 2015-07-20 DIAGNOSIS — Z9861 Coronary angioplasty status: Secondary | ICD-10-CM

## 2015-07-20 DIAGNOSIS — Z6836 Body mass index (BMI) 36.0-36.9, adult: Secondary | ICD-10-CM | POA: Diagnosis not present

## 2015-07-20 DIAGNOSIS — I2510 Atherosclerotic heart disease of native coronary artery without angina pectoris: Secondary | ICD-10-CM | POA: Insufficient documentation

## 2015-07-20 DIAGNOSIS — M545 Low back pain, unspecified: Secondary | ICD-10-CM

## 2015-07-20 DIAGNOSIS — M940 Chondrocostal junction syndrome [Tietze]: Secondary | ICD-10-CM | POA: Insufficient documentation

## 2015-07-20 DIAGNOSIS — F32A Depression, unspecified: Secondary | ICD-10-CM

## 2015-07-20 DIAGNOSIS — Z7984 Long term (current) use of oral hypoglycemic drugs: Secondary | ICD-10-CM | POA: Insufficient documentation

## 2015-07-20 DIAGNOSIS — E119 Type 2 diabetes mellitus without complications: Secondary | ICD-10-CM | POA: Insufficient documentation

## 2015-07-20 DIAGNOSIS — R300 Dysuria: Secondary | ICD-10-CM | POA: Diagnosis not present

## 2015-07-20 DIAGNOSIS — E039 Hypothyroidism, unspecified: Secondary | ICD-10-CM | POA: Insufficient documentation

## 2015-07-20 DIAGNOSIS — Z79899 Other long term (current) drug therapy: Secondary | ICD-10-CM | POA: Diagnosis not present

## 2015-07-20 DIAGNOSIS — Z87891 Personal history of nicotine dependence: Secondary | ICD-10-CM | POA: Insufficient documentation

## 2015-07-20 DIAGNOSIS — Z794 Long term (current) use of insulin: Secondary | ICD-10-CM | POA: Diagnosis not present

## 2015-07-20 DIAGNOSIS — I5032 Chronic diastolic (congestive) heart failure: Secondary | ICD-10-CM | POA: Insufficient documentation

## 2015-07-20 DIAGNOSIS — R0789 Other chest pain: Secondary | ICD-10-CM | POA: Insufficient documentation

## 2015-07-20 DIAGNOSIS — E875 Hyperkalemia: Secondary | ICD-10-CM | POA: Insufficient documentation

## 2015-07-20 LAB — POCT URINALYSIS DIPSTICK
Glucose, UA: NEGATIVE
KETONES UA: NEGATIVE
Leukocytes, UA: NEGATIVE
Nitrite, UA: NEGATIVE
PH UA: 5.5
Protein, UA: 30
RBC UA: NEGATIVE
SPEC GRAV UA: 1.025
Urobilinogen, UA: 0.2

## 2015-07-20 LAB — BASIC METABOLIC PANEL
BUN: 15 mg/dL (ref 7–25)
CO2: 26 mmol/L (ref 20–31)
CREATININE: 0.6 mg/dL (ref 0.50–1.05)
Calcium: 10 mg/dL (ref 8.6–10.4)
Chloride: 98 mmol/L (ref 98–110)
Glucose, Bld: 178 mg/dL — ABNORMAL HIGH (ref 65–99)
Potassium: 4.6 mmol/L (ref 3.5–5.3)
Sodium: 136 mmol/L (ref 135–146)

## 2015-07-20 LAB — GLUCOSE, POCT (MANUAL RESULT ENTRY): POC GLUCOSE: 216 mg/dL — AB (ref 70–99)

## 2015-07-20 NOTE — Patient Instructions (Signed)
PLAN:  1. F/U with behavioral health consultant in one month, or as needed 2. Psychiatric Medications: none 3. Behavioral recommendation(s):  -Go back to [facility] to re-establish care -Take food resource booklets home today -Continue to pace physical activity according to pain

## 2015-07-20 NOTE — Progress Notes (Signed)
Subjective:  Patient ID: Jacqueline Munoz, female    DOB: May 28, 1961  Age: 55 y.o. MRN: [id number]  CC: Follow-up   HPI Jacqueline Munoz  is a 55 year old female with a history of controlled Type 2 Diabetes Mellitus (A1c 7.3), Hypertension, CAD (s/p multiple stents), Hypothyroidism, chronic pain, previous smoker recently hospitalized at [facility] for newly diagnosed acute diastolic CHF in 40/9811 with an EF of 91-47%, grade 2 diastolic dysfunction. She had received IV diuresis with resulting loss of about 9 pounds and improvement in dyspnea after which was subsequently discharged; she was seen at the transitional care clinic last month and is coming in today for follow-up visit.  Today she denies shortness of breath or pedal edema and has been compliant with her medications. Complains of chest wall pain which is in the entire anterior chest wall extending to the lateral aspect of the chest wall and hurts when she pushes on her chest wall. She also has left sided pelvic pain in the region of the groin but denies any frank urinary symptoms or abdominal pain or nausea or vomiting. She has no fever. She endorses being depressed and informs me she was supposed to be seen at [facility] but something came up and she had to reschedule. Denies suicidal ideations or intent.   Outpatient Prescriptions Prior to Visit  Medication Sig Dispense Refill  . aspirin EC 81 MG EC tablet Take 1 tablet (81 mg total) by mouth daily. 30 tablet 0  . atorvastatin (LIPITOR) 40 MG tablet TAKE 2 TABLETS BY MOUTH DAILY AT 6 PM. 60 tablet 2  . carvedilol (COREG) 3.125 MG tablet Take 1 tablet (3.125 mg total) by mouth 2 (two) times daily with a meal. 60 tablet 0  . clopidogrel (PLAVIX) 75 MG tablet Take 1 tablet (75 mg total) by mouth daily. 30 tablet 2  . Elastic Bandages & Supports (MEDICAL COMPRESSION STOCKINGS) MISC Wear daily 30 mm Hg pressure 2 each 0  . fluconazole (DIFLUCAN) 150 MG tablet Take 1 tablet  (150 mg total) by mouth once. 1 tablet 0  . furosemide (LASIX) 40 MG tablet Take 1 tablet (40 mg total) by mouth daily. 30 tablet 6  . glucose monitoring kit (FREESTYLE) monitoring kit 1 each by Does not apply route 4 (four) times daily - after meals and at bedtime. 1 month Diabetic Testing Supplies for QAC-QHS accuchecks. 1 each 1  . Insulin Detemir (LEVEMIR) 100 UNIT/ML Pen INJECT 35 UNITS INTO THE SKIN AT BEDTIME. 15 mL 1  . Insulin Syringe-Needle U-100 (TRUEPLUS INSULIN SYRINGE) 30G X 5/16" 0.5 ML MISC 1 each by Does not apply route 4 (four) times daily. 100 each 11  . isosorbide mononitrate (IMDUR) 30 MG 24 hr tablet Take 1 tablet (30 mg total) by mouth daily. 30 tablet 6  . levothyroxine (SYNTHROID, LEVOTHROID) 200 MCG tablet Take 1 tablet (200 mcg total) by mouth daily before breakfast. 30 tablet 6  . lisinopril (PRINIVIL,ZESTRIL) 10 MG tablet Take 10 mg by mouth daily.    . meclizine (ANTIVERT) 25 MG tablet Take 1 tablet (25 mg total) by mouth 2 (two) times daily as needed for dizziness. 60 tablet 3  . metFORMIN (GLUCOPHAGE) 500 MG tablet Take 1 tablet (500 mg total) by mouth 2 (two) times daily with a meal. 60 tablet 5  . nitroGLYCERIN (NITROSTAT) 0.4 MG SL tablet Place 0.4 mg under the tongue every 5 (five) minutes as needed for chest pain (may take three doses by  mouth per day as needed).    [name] oxyCODONE-acetaminophen (PERCOCET/ROXICET) 5-325 MG tablet Take 1-2 tablets by mouth every 4 (four) hours as needed for severe pain. 30 tablet 0  . potassium chloride SA (K-DUR,KLOR-CON) 20 MEQ tablet Take 1 tablet (20 mEq total) by mouth daily. 30 tablet 6   No facility-administered medications prior to visit.    ROS Review of Systems  Constitutional: Negative for activity change, appetite change and fatigue.  HENT: Negative for congestion, sinus pressure and sore throat.   Eyes: Negative for visual disturbance.  Respiratory: Negative for cough, chest tightness, shortness of breath and  wheezing.   Cardiovascular: Positive for chest pain (chest wall pain which extends from the anterior chest wall to the lateral aspect of the chest wall bilaterally). Negative for palpitations.  Gastrointestinal: Negative for abdominal pain, constipation and abdominal distention.       Left sided pelvic pain  Endocrine: Negative for polydipsia.  Genitourinary: Negative for dysuria and frequency.  Musculoskeletal: Negative for back pain and arthralgias.  Skin: Negative for rash.  Neurological: Negative for tremors, light-headedness and numbness.  Hematological: Does not bruise/bleed easily.  Psychiatric/Behavioral: Positive for dysphoric mood. Negative for behavioral problems and agitation.    Objective:  BP 122/83 mmHg  Pulse 90  Temp(Src) 98.8 F (37.1 C)  Resp 13  Ht '5\' 2"'$  (1.575 m)  Wt 198 lb 12.8 oz (90.175 kg)  BMI 36.35 kg/m2  SpO2 97%  BP/Weight 07/20/2015 06/27/2015 63/07/6008  Systolic BP 932 355 732  Diastolic BP 83 85 78  Wt. (Lbs) 198.8 195.6 196.4  BMI 36.35 35.77 35.91      Physical Exam Constitutional: She is oriented to person, place, and time. She appears well-developed and well-nourished. No distress.   Neck: Normal range of motion. No JVD present.  Cardiovascular: Normal rate, regular rhythm, normal heart sounds and intact distal pulses.  Exam reveals no gallop.   No murmur heard. Pulmonary/Chest: Effort normal and breath sounds normal. No respiratory distress. She has no wheezes. She has no rales. She exhibits reproducible tenderness on palpation of entire chest wall.  Abdominal: Soft. Bowel sounds are normal. She exhibits no distension and no mass. Left sided pelvic tenderness on deep palpation of the pelvic bone.  Musculoskeletal: Normal range of motion. She exhibits no edema or tenderness.  Neurological: She is alert and oriented to person, place, and time. She has normal reflexes.  Skin: Skin is warm and dry. She is not diaphoretic.  Psychiatric: She  has a normal mood and affect.    Assessment & Plan:   1. Type 2 diabetes mellitus without complication, with long-term current use of insulin (HCC)  - Glucose (CBG)  2. Dysuria Negative for UTI Unexplainable etiology of sudden onset left sided pelvic pain. - Urinalysis Dipstick  3. Costochondritis Unable to take NSAIDs and so I have advised her to use her pain medications which she receives some to pain clinic  4. Hyperkalemia Last potassium was 5.5. She has been off potassium since then We will repeat - Basic Metabolic Panel  5. Essential hypertension Controlled  6. Hypothyroidism, unspecified hypothyroidism type Controlled  7. Anxiety and depression She is currently not on any antidepressant. Was scheduled to see Beverly Sessions but had to reschedule her appointment due to other issues and she will be going into early next week for evaluation  8. CHF and CAD S/P percutaneous coronary angioplasty EF 20-25%, grade 2 diastolic dysfunction from 2-D echo 06/12/2015. Stable, Euvolemic Keep appointment with cardiology.  No orders of the defined types were placed in this encounter.    Follow-up: Return in about 1 month (around 08/20/2015) for Follow-up of diabetes mellitus with Dr. Richardine Service.   Arnoldo Morale MD

## 2015-07-20 NOTE — Progress Notes (Signed)
Patient feels she has a bladder infection-urine obtained She states last time she was hyperkalemic and needs to have blood re-drawn

## 2015-07-20 NOTE — Progress Notes (Signed)
ASSESSMENT: Pt experiencing symptoms of anxiety and depression, needs to f/u with PCP and [facility], and re-establish with psychiatry; pt would benefit from supportive counseling regarding coping with symptoms of anxiety and depression.  Stage of Change: precontemplative  PLAN: 1. F/U with behavioral health consultant in one month, or as needed 2. Psychiatric Medications: none 3. Behavioral recommendation(s):   -Go back to [facility] to re-establish care -Take food resource booklets home today -Continue to pace physical activity according to pain  SUBJECTIVE: Pt. referred by Dr Jarold Song for symptoms of anxiety and depression:  Pt. reports the following symptoms/concerns: Pt states that her primary concern today is obtaining food resource booklets; she did get help from her mother since her last visit at [facility]. Pt  is learning to cope with chronic pain by pacing herself on housework and any physical activity.  Duration of problem: over one month Severity: severe  OBJECTIVE: Orientation & Cognition: Oriented x3. Thought processes normal and appropriate to situation. Mood: appropriate. Affect: appropriate Appearance: appropriate Risk of harm to self or others: no known risk of harm to self or others Substance use: tobacco Assessments administered: PHQ9: 23/GAD7: 21  Diagnosis: Anxiety and depression CPT Code: F41.8 -------------------------------------------- Other(s) present in the room: none  Time spent with patient in exam room: 16 minutes

## 2015-07-20 NOTE — Patient Instructions (Signed)

## 2015-07-21 ENCOUNTER — Telehealth: Payer: Self-pay | Admitting: *Deleted

## 2015-07-21 NOTE — Telephone Encounter (Signed)
ATC, NA and no option to leave msg, WCB

## 2015-07-21 NOTE — Telephone Encounter (Signed)
-----   Message from Tanda Rockers, MD sent at 03/25/2015  8:22 AM EDT ----- F/u ct chest no contrast due f/u mpns

## 2015-07-24 ENCOUNTER — Telehealth: Payer: Self-pay

## 2015-07-24 NOTE — Telephone Encounter (Signed)
CMA called patient, patient did not answer. Patient cell # stated that "This person is not taken calls at this time." Pt did not answer the home number on file either.

## 2015-07-24 NOTE — Telephone Encounter (Signed)
-----   Message from Arnoldo Morale, MD sent at 07/21/2015  2:03 PM EST ----- Potassium is normal

## 2015-07-25 DIAGNOSIS — E669 Obesity, unspecified: Secondary | ICD-10-CM | POA: Diagnosis not present

## 2015-07-25 DIAGNOSIS — M1288 Other specific arthropathies, not elsewhere classified, other specified site: Secondary | ICD-10-CM | POA: Diagnosis not present

## 2015-07-25 DIAGNOSIS — Z79899 Other long term (current) drug therapy: Secondary | ICD-10-CM | POA: Diagnosis not present

## 2015-07-25 DIAGNOSIS — M545 Low back pain: Secondary | ICD-10-CM | POA: Diagnosis not present

## 2015-07-25 DIAGNOSIS — M79606 Pain in leg, unspecified: Secondary | ICD-10-CM | POA: Diagnosis not present

## 2015-07-25 DIAGNOSIS — G894 Chronic pain syndrome: Secondary | ICD-10-CM | POA: Diagnosis not present

## 2015-07-26 ENCOUNTER — Telehealth: Payer: Self-pay | Admitting: Family Medicine

## 2015-07-26 ENCOUNTER — Telehealth: Payer: Self-pay

## 2015-07-26 MED FILL — ISOSORBIDE MN ER 30 MG TAB: 30 | 30 days supply | Qty: 30 | Fill #6

## 2015-07-26 MED FILL — LEVOTHYROXINE 200 MCG TAB: 200 | 30 days supply | Qty: 30 | Fill #1

## 2015-07-26 MED FILL — LISINOPRIL 10 MG TABLET: 10 | 30 days supply | Qty: 30 | Fill #2

## 2015-07-26 MED FILL — FUROSEMIDE 40 MG TABLET: 40 | 30 days supply | Qty: 30 | Fill #1

## 2015-07-26 MED FILL — metFORMIN HCL 500 MG TABS: 500 | 30 days supply | Qty: 60 | Fill #3

## 2015-07-26 MED FILL — CLOPIDOGREL 75 MG TABLET: 75 | 90 days supply | Qty: 90 | Fill #2

## 2015-07-26 MED FILL — ATORVASTATIN 40 MG TABLET: 40 | 30 days supply | Qty: 60 | Fill #1

## 2015-07-26 NOTE — Telephone Encounter (Addendum)
CMA called patient cell #. The voice respond to patient cell stated that "this is person can't accept your call at this time, sorry for any inconvenience this may cause." Call was then disconnected. I called patient home # and it said, "Please call later." 2nd attempt calling patient. Will try back tomorrow, 07/26/15. If patient can't be reached, patient will be sent a letter to address on file.

## 2015-07-26 NOTE — Telephone Encounter (Signed)
-----   Message from Arnoldo Morale, MD sent at 07/21/2015  2:03 PM EST ----- Potassium is normal

## 2015-07-26 NOTE — Telephone Encounter (Signed)
This is Dr. Adrian Blackwater patient. Message will transferred to Dr. Adrian Blackwater CMA, Junious Dresser.

## 2015-07-26 NOTE — Telephone Encounter (Signed)
Patient states that she has COPD and patient questioned if she needs to get the pneumonia shot? Please follow up.

## 2015-07-27 NOTE — Telephone Encounter (Signed)
LMTCB w/ family member 

## 2015-07-31 NOTE — Telephone Encounter (Signed)
ATC, NA and no option to leave msg, WCB

## 2015-08-01 NOTE — Telephone Encounter (Signed)
Attempted to contact pt. No answer, no option to leave a message. Will try back.  

## 2015-08-02 NOTE — Telephone Encounter (Signed)
Unable to contact pt. Not accepting call at this time 

## 2015-08-11 ENCOUNTER — Encounter: Payer: Self-pay | Admitting: *Deleted

## 2015-08-11 NOTE — Telephone Encounter (Signed)
Letter mailed to the pt. 

## 2015-08-21 ENCOUNTER — Emergency Department (HOSPITAL_COMMUNITY): Payer: Medicare Other

## 2015-08-21 ENCOUNTER — Encounter (HOSPITAL_COMMUNITY): Payer: Self-pay | Admitting: Emergency Medicine

## 2015-08-21 ENCOUNTER — Encounter: Payer: Self-pay | Admitting: Family Medicine

## 2015-08-21 ENCOUNTER — Encounter: Payer: Self-pay | Admitting: Clinical

## 2015-08-21 ENCOUNTER — Ambulatory Visit (HOSPITAL_BASED_OUTPATIENT_CLINIC_OR_DEPARTMENT_OTHER): Payer: Medicare Other | Admitting: Family Medicine

## 2015-08-21 VITALS — BP 94/61 | HR 75 | Temp 98.7°F | Resp 16 | Ht 62.0 in | Wt 190.0 lb

## 2015-08-21 DIAGNOSIS — Z90710 Acquired absence of both cervix and uterus: Secondary | ICD-10-CM | POA: Insufficient documentation

## 2015-08-21 DIAGNOSIS — Z23 Encounter for immunization: Secondary | ICD-10-CM

## 2015-08-21 DIAGNOSIS — Z87442 Personal history of urinary calculi: Secondary | ICD-10-CM | POA: Diagnosis not present

## 2015-08-21 DIAGNOSIS — N939 Abnormal uterine and vaginal bleeding, unspecified: Secondary | ICD-10-CM | POA: Diagnosis not present

## 2015-08-21 DIAGNOSIS — Z794 Long term (current) use of insulin: Secondary | ICD-10-CM | POA: Insufficient documentation

## 2015-08-21 DIAGNOSIS — R079 Chest pain, unspecified: Secondary | ICD-10-CM | POA: Insufficient documentation

## 2015-08-21 DIAGNOSIS — Z7982 Long term (current) use of aspirin: Secondary | ICD-10-CM

## 2015-08-21 DIAGNOSIS — Z7902 Long term (current) use of antithrombotics/antiplatelets: Secondary | ICD-10-CM | POA: Insufficient documentation

## 2015-08-21 DIAGNOSIS — I2510 Atherosclerotic heart disease of native coronary artery without angina pectoris: Secondary | ICD-10-CM | POA: Diagnosis not present

## 2015-08-21 DIAGNOSIS — N644 Mastodynia: Secondary | ICD-10-CM

## 2015-08-21 DIAGNOSIS — R0789 Other chest pain: Secondary | ICD-10-CM | POA: Diagnosis not present

## 2015-08-21 DIAGNOSIS — I252 Old myocardial infarction: Secondary | ICD-10-CM | POA: Insufficient documentation

## 2015-08-21 DIAGNOSIS — M797 Fibromyalgia: Secondary | ICD-10-CM | POA: Diagnosis not present

## 2015-08-21 DIAGNOSIS — Z7984 Long term (current) use of oral hypoglycemic drugs: Secondary | ICD-10-CM | POA: Insufficient documentation

## 2015-08-21 DIAGNOSIS — E785 Hyperlipidemia, unspecified: Secondary | ICD-10-CM | POA: Insufficient documentation

## 2015-08-21 DIAGNOSIS — I10 Essential (primary) hypertension: Secondary | ICD-10-CM | POA: Diagnosis not present

## 2015-08-21 DIAGNOSIS — N842 Polyp of vagina: Secondary | ICD-10-CM

## 2015-08-21 DIAGNOSIS — Z79899 Other long term (current) drug therapy: Secondary | ICD-10-CM | POA: Insufficient documentation

## 2015-08-21 DIAGNOSIS — F329 Major depressive disorder, single episode, unspecified: Secondary | ICD-10-CM | POA: Insufficient documentation

## 2015-08-21 DIAGNOSIS — N843 Polyp of vulva: Secondary | ICD-10-CM | POA: Insufficient documentation

## 2015-08-21 DIAGNOSIS — Z87891 Personal history of nicotine dependence: Secondary | ICD-10-CM | POA: Insufficient documentation

## 2015-08-21 DIAGNOSIS — E119 Type 2 diabetes mellitus without complications: Secondary | ICD-10-CM | POA: Diagnosis not present

## 2015-08-21 DIAGNOSIS — R0602 Shortness of breath: Secondary | ICD-10-CM | POA: Diagnosis not present

## 2015-08-21 DIAGNOSIS — Z880 Allergy status to penicillin: Secondary | ICD-10-CM | POA: Insufficient documentation

## 2015-08-21 DIAGNOSIS — N63 Unspecified lump in breast: Secondary | ICD-10-CM

## 2015-08-21 DIAGNOSIS — R918 Other nonspecific abnormal finding of lung field: Secondary | ICD-10-CM

## 2015-08-21 DIAGNOSIS — E039 Hypothyroidism, unspecified: Secondary | ICD-10-CM | POA: Diagnosis not present

## 2015-08-21 DIAGNOSIS — G8929 Other chronic pain: Secondary | ICD-10-CM | POA: Insufficient documentation

## 2015-08-21 DIAGNOSIS — N6320 Unspecified lump in the left breast, unspecified quadrant: Secondary | ICD-10-CM | POA: Insufficient documentation

## 2015-08-21 LAB — BASIC METABOLIC PANEL
Anion gap: 13 (ref 5–15)
BUN: 25 mg/dL — AB (ref 6–20)
CHLORIDE: 99 mmol/L — AB (ref 101–111)
CO2: 24 mmol/L (ref 22–32)
Calcium: 9.9 mg/dL (ref 8.9–10.3)
Creatinine, Ser: 0.99 mg/dL (ref 0.44–1.00)
GFR calc Af Amer: >60 mL/min (ref >60)
GFR calc non Af Amer: >60 mL/min (ref >60)
GLUCOSE: 171 mg/dL — AB (ref 65–99)
POTASSIUM: 4.4 mmol/L (ref 3.5–5.1)
Sodium: 136 mmol/L (ref 135–145)

## 2015-08-21 LAB — CBC
HEMATOCRIT: 35.8 % — AB (ref 36.0–46.0)
Hemoglobin: 12.4 g/dL (ref 12.0–15.0)
MCH: 30.5 pg (ref 26.0–34.0)
MCHC: 34.6 g/dL (ref 30.0–36.0)
MCV: 88.2 fL (ref 78.0–100.0)
Platelets: 214 10*3/uL (ref 150–400)
RBC: 4.06 MIL/uL (ref 3.87–5.11)
RDW: 13.4 % (ref 11.5–15.5)
WBC: 10.2 10*3/uL (ref 4.0–10.5)

## 2015-08-21 LAB — I-STAT TROPONIN, ED: Troponin i, poc: 0.01 ng/mL (ref 0.00–0.08)

## 2015-08-21 LAB — GLUCOSE, POCT (MANUAL RESULT ENTRY): POC GLUCOSE: 210 mg/dL — AB (ref 70–99)

## 2015-08-21 NOTE — Progress Notes (Signed)
Depression screen [facility] 2/9 08/21/2015 07/20/2015 06/27/2015 03/20/2015 01/20/2015  Decreased Interest 3 3 3 3  0  Down, Depressed, Hopeless 3 3 3 3  0  PHQ - 2 Score 6 6 6 6  0  Altered sleeping 3 3 3 3  -  Tired, decreased energy 3 3 3 3  -  Change in appetite 3 3 2 2  -  Feeling bad or failure about yourself  3 3 3 3  -  Trouble concentrating 3 3 2 2  -  Moving slowly or fidgety/restless 2 2 1 3  -  Suicidal thoughts 2 (No Data) 0 1 -  PHQ-9 Score 25 23 20 23  -   GAD 7 : Generalized Anxiety Score 08/21/2015 07/20/2015 06/27/2015  Nervous, Anxious, on Edge 3 3 1   Control/stop worrying 3 3 3   Worry too much - different things 3 3 3   Trouble relaxing 3 3 3   Restless 1 3 0  Easily annoyed or irritable 2 3 3   Afraid - awful might happen 3 3 3   Total GAD 7 Score 18 21 16

## 2015-08-21 NOTE — Progress Notes (Signed)
C/C Knot under lt breast  Getting large and painful  Pain scale #10 Former tobacco user  No suicidal thoughts in the past two weeks

## 2015-08-21 NOTE — Assessment & Plan Note (Signed)
A: chronic chest wall/breast pain with normal breast tissue P: gen surg referral to discuss possibility of lumpectomy or breast reduction to relieve chronic pain

## 2015-08-21 NOTE — Patient Instructions (Addendum)
Dyan was seen today for breast pain and vaginal bleeding.  Diagnoses and all orders for this visit:  Type 2 diabetes mellitus without complication, unspecified long term insulin use status (HCC) -     POCT glucose (manual entry)  Painful lumpy left breast -     Ambulatory referral to General Surgery  Vaginal polyp -     Ambulatory referral to Gynecology  Vaginal bleeding -     Ambulatory referral to Gynecology  Other orders -     Pneumococcal polysaccharide vaccine 23-valent greater than or equal to 2yo subcutaneous/IM    You will be called with referral appointments   F/u in 6 weeks   Dr. Adrian Blackwater

## 2015-08-21 NOTE — ED Notes (Signed)
Pt visiting family in ED when started having sudden onset of left sided cp with no radiation, pt reports diaphoresis and light headedness. Pt alert and oriented, nad noted.

## 2015-08-21 NOTE — Assessment & Plan Note (Signed)
A; friable vaginal polyp with report of vaginal bleeding P: Gyn referral to determine of biopsy needed

## 2015-08-21 NOTE — Progress Notes (Signed)
Subjective:  Patient ID: Jacqueline Munoz, female    DOB: 1960/09/07  Age: 55 y.o. MRN: [id number]  CC: Cyst   HPI Jacqueline Munoz presents for    1. Chest wall/breast pain: ongoing for past 2.5 years. She has hard normal mammogram and L breast ultrasound in 12/29/2014. She has normal CT chest in 02/10/2015 except for pulmonary nodules. Repeat CT angio chest in 04/20/2015 revealed slight enlargement of left lower lobe nodule with increase in size to 7 mm with recommendation of f/u CT chest in 9 months. She endorses persistent pain that is often severe. She takes percocet from pain management for chronic pain but it does not relieve pain.   2. Vaginal bleeding: x 2 weeks. Scant, painless bleeding. She is s/p hysterectomy. She is not sexually active. No vaginal discharge.   Social History  Substance Use Topics  . Smoking status: Former Smoker -- 1.50 packs/day for 39 years    Types: Cigarettes    Quit date: 11/10/2014  . Smokeless tobacco: Never Used  . Alcohol Use: No     Comment: 11/15/2014 "might have a few drinks/yr"    Outpatient Prescriptions Prior to Visit  Medication Sig Dispense Refill  . aspirin EC 81 MG EC tablet Take 1 tablet (81 mg total) by mouth daily. 30 tablet 0  . atorvastatin (LIPITOR) 40 MG tablet TAKE 2 TABLETS BY MOUTH DAILY AT 6 PM. 60 tablet 2  . carvedilol (COREG) 3.125 MG tablet Take 1 tablet (3.125 mg total) by mouth 2 (two) times daily with a meal. 60 tablet 0  . clopidogrel (PLAVIX) 75 MG tablet Take 1 tablet (75 mg total) by mouth daily. 30 tablet 2  . furosemide (LASIX) 40 MG tablet Take 1 tablet (40 mg total) by mouth daily. 30 tablet 6  . glucose monitoring kit (FREESTYLE) monitoring kit 1 each by Does not apply route 4 (four) times daily - after meals and at bedtime. 1 month Diabetic Testing Supplies for QAC-QHS accuchecks. 1 each 1  . Insulin Detemir (LEVEMIR) 100 UNIT/ML Pen INJECT 35 UNITS INTO THE SKIN AT BEDTIME. 15 mL 1  . Insulin Syringe-Needle U-100  (TRUEPLUS INSULIN SYRINGE) 30G X 5/16" 0.5 ML MISC 1 each by Does not apply route 4 (four) times daily. 100 each 11  . isosorbide mononitrate (IMDUR) 30 MG 24 hr tablet Take 1 tablet (30 mg total) by mouth daily. 30 tablet 6  . levothyroxine (SYNTHROID, LEVOTHROID) 200 MCG tablet Take 1 tablet (200 mcg total) by mouth daily before breakfast. 30 tablet 6  . lisinopril (PRINIVIL,ZESTRIL) 10 MG tablet Take 10 mg by mouth daily.    . meclizine (ANTIVERT) 25 MG tablet Take 1 tablet (25 mg total) by mouth 2 (two) times daily as needed for dizziness. 60 tablet 3  . metFORMIN (GLUCOPHAGE) 500 MG tablet Take 1 tablet (500 mg total) by mouth 2 (two) times daily with a meal. 60 tablet 5  . nitroGLYCERIN (NITROSTAT) 0.4 MG SL tablet Place 0.4 mg under the tongue every 5 (five) minutes as needed for chest pain (may take three doses by mouth per day as needed).    [name] oxyCODONE-acetaminophen (PERCOCET/ROXICET) 5-325 MG tablet Take 1-2 tablets by mouth every 4 (four) hours as needed for severe pain. 30 tablet 0  . potassium chloride SA (K-DUR,KLOR-CON) 20 MEQ tablet Take 1 tablet (20 mEq total) by mouth daily. (Patient not taking: Reported on 08/21/2015) 30 tablet 6  . Elastic Bandages & Supports (MEDICAL COMPRESSION STOCKINGS) MISC Wear daily 30  mm Hg pressure (Patient not taking: Reported on 08/21/2015) 2 each 0  . fluconazole (DIFLUCAN) 150 MG tablet Take 1 tablet (150 mg total) by mouth once. (Patient not taking: Reported on 08/21/2015) 1 tablet 0   No facility-administered medications prior to visit.    ROS Review of Systems  Constitutional: Negative for fever and chills.  Eyes: Negative for visual disturbance.  Respiratory: Negative for shortness of breath.   Cardiovascular: Positive for chest pain (chest wall pain ).       Breast pain   Gastrointestinal: Negative for abdominal pain and blood in stool.  Genitourinary: Positive for vaginal bleeding.  Musculoskeletal: Negative for back pain and arthralgias.    Skin: Negative for rash.  Allergic/Immunologic: Negative for immunocompromised state.  Hematological: Negative for adenopathy. Does not bruise/bleed easily.  Psychiatric/Behavioral: Negative for suicidal ideas and dysphoric mood.    Objective:  BP 94/61 mmHg  Pulse 75  Temp(Src) 98.7 F (37.1 C) (Oral)  Resp 16  Ht '5\' 2"'$  (1.575 m)  Wt 190 lb (86.183 kg)  BMI 34.74 kg/m2  SpO2 96%  BP/Weight 08/21/2015 07/20/2015 10/93/2355  Systolic BP 94 732 202  Diastolic BP 61 83 85  Wt. (Lbs) 190 198.8 195.6  BMI 34.74 36.35 35.77    Physical Exam  Constitutional: She appears well-developed and well-nourished. No distress.  Cardiovascular: Normal rate, regular rhythm, normal heart sounds and intact distal pulses.   Pulmonary/Chest: Effort normal and breath sounds normal. Right breast exhibits no inverted nipple, no mass, no nipple discharge, no skin change and no tenderness. Left breast exhibits tenderness. Left breast exhibits no inverted nipple, no mass, no nipple discharge and no skin change. Breasts are symmetrical.    Genitourinary: Vagina normal.    Pelvic exam was performed with patient prone. There is no rash, tenderness or lesion on the right labia. There is no rash, tenderness or lesion on the left labia. Cervix exhibits no motion tenderness, no discharge and no friability.  Musculoskeletal: She exhibits no edema.  Lymphadenopathy:       Right: No inguinal adenopathy present.       Left: No inguinal adenopathy present.  Skin: Skin is warm and dry. No rash noted.    Lab Results  Component Value Date   HGBA1C 7.30 06/27/2015     Assessment & Plan:   Jacqueline Munoz was seen today for breast pain and vaginal bleeding.  Diagnoses and all orders for this visit:  Type 2 diabetes mellitus without complication, unspecified long term insulin use status (HCC) -     POCT glucose (manual entry)  Painful lumpy left breast -     Ambulatory referral to General Surgery  Vaginal polyp -      Ambulatory referral to Gynecology  Vaginal bleeding -     Ambulatory referral to Gynecology  Other orders -     Pneumococcal polysaccharide vaccine 23-valent greater than or equal to 2yo subcutaneous/IM    No orders of the defined types were placed in this encounter.    Follow-up: No Follow-up on file.   Boykin Nearing MD

## 2015-08-22 ENCOUNTER — Ambulatory Visit: Payer: Medicare Other | Admitting: Cardiology

## 2015-08-22 ENCOUNTER — Emergency Department (HOSPITAL_COMMUNITY)
Admission: EM | Admit: 2015-08-22 | Discharge: 2015-08-22 | Disposition: A | Discharge status: Home / Self Care | Payer: Medicare Other | Attending: Emergency Medicine | Admitting: Emergency Medicine

## 2015-08-22 ENCOUNTER — Encounter: Payer: Self-pay | Admitting: Obstetrics & Gynecology

## 2015-08-22 DIAGNOSIS — M1288 Other specific arthropathies, not elsewhere classified, other specified site: Secondary | ICD-10-CM | POA: Diagnosis not present

## 2015-08-22 DIAGNOSIS — R079 Chest pain, unspecified: Secondary | ICD-10-CM

## 2015-08-22 DIAGNOSIS — M79606 Pain in leg, unspecified: Secondary | ICD-10-CM | POA: Diagnosis not present

## 2015-08-22 DIAGNOSIS — Z79899 Other long term (current) drug therapy: Secondary | ICD-10-CM | POA: Diagnosis not present

## 2015-08-22 DIAGNOSIS — M545 Low back pain: Secondary | ICD-10-CM | POA: Diagnosis not present

## 2015-08-22 DIAGNOSIS — G894 Chronic pain syndrome: Secondary | ICD-10-CM | POA: Diagnosis not present

## 2015-08-22 LAB — I-STAT TROPONIN, ED
TROPONIN I, POC: 0 ng/mL (ref 0.00–0.08)
Troponin i, poc: 0 ng/mL (ref 0.00–0.08)

## 2015-08-22 MED ORDER — NITROGLYCERIN 0.4 MG SL SUBL
0.4000 mg | SUBLINGUAL_TABLET | SUBLINGUAL | Status: DC | PRN
  Administered 2015-08-22: 0.4 mg via SUBLINGUAL
  Filled 2015-08-22: qty 1

## 2015-08-22 MED ORDER — ASPIRIN 81 MG PO CHEW
324.0000 mg | CHEWABLE_TABLET | Freq: Once | ORAL | Status: AC
  Administered 2015-08-22: 324 mg via ORAL
  Filled 2015-08-22: qty 4

## 2015-08-22 MED ORDER — OXYCODONE-ACETAMINOPHEN 5-325 MG PO TABS
1.0000 | ORAL_TABLET | Freq: Once | ORAL | Status: AC
  Administered 2015-08-22: 1 via ORAL
  Filled 2015-08-22: qty 1

## 2015-08-22 MED ORDER — MORPHINE SULFATE (PF) 4 MG/ML IV SOLN: 4.0000 mg | Freq: Once | INTRAVENOUS | Status: DC

## 2015-08-22 NOTE — ED Provider Notes (Signed)
CSN: [id number]     Arrival date & time 08/21/15  2041 History   First MD Initiated Contact with Patient 08/22/15 0444     Chief Complaint  Patient presents with  . Chest Pain     (Consider location/radiation/quality/duration/timing/severity/associated sxs/prior Treatment) Patient is a 55 y.o. female presenting with chest pain. The history is provided by the patient.  Chest Pain She has a history of coronary artery disease, status post stent placement. This evening at 8 PM, while in the ED with her mother who had fallen and had an intracranial hemorrhage, she developed left sided chest pain with radiation to the back. Pain is dull and heavy and tight and somewhat similar to what she had with her heart attacks. Pain is rated at 9/10. There is no associated dyspnea or nausea but she has had diaphoresis. She has not taken anything for pain. Nothing makes it better nothing makes it worse.  Past Medical History  Diagnosis Date  . Fibromyalgia   . History of tobacco abuse   . Hypothyroid   . OSA (obstructive sleep apnea)     a. does not wear CPAP  . History of stomach ulcers   . Arthritis   . Chronic back pain   . Chronic depression   . Kidney stones   . Essential hypertension   . HLD (hyperlipidemia)   . CAD S/P percutaneous coronary angioplasty     a. NSTEMI: s/p DES x2 LAD and DES to bifurcation of OM1 and mid to distal LCx (11/16/14) b. UA/ACS:  overlapping DES x1 to LAD for de novo 99% focal lesion in LAD (12/02/2014)  . Coronary artery chronic total occlusion: 100% RCA CTO 11/16/2014    a. Prox RCA to Mid RCA lesion, 100% stenosed.   . Type IVa MI - Peri-PCI 11/17/2014    Prolonged ischemia during bifurcation PCI of  m-d Cx-OM1 with minicrush technique  . Diabetes mellitus, type 2 ([place])   . HLD (hyperlipidemia)    Past Surgical History  Procedure Laterality Date  . Cesarean section  2000  . Inner ear surgery Bilateral "several"  . Tonsillectomy    . Total abdominal hysterectomy   ~ 2008  . Tubal ligation  2000  . Cardiac catheterization N/A 11/16/2014    Procedure: Left Heart Cath and Coronary Angiography;  Surgeon: Peter M Martinique, MD;  Location: [place] [facility];  Service: Cardiovascular;  RCA 100% CTO, m-dLAD 90%, m-dCx 95%, OM1 90%  . Cardiac catheterization  11/16/2014    Procedure: Coronary Stent Intervention;  Surgeon: Peter M Martinique, MD;  Location: [place] [facility];  Service: Cardiovascular;;m-d LAD overlapping 3.0 x 16 & 2.75 x 16 Promus P DES; OM1-dCx Minicrush bifurcation (dCx 2.25 x 38 Synergy DES crushed with 2.75 x 24 Synergy DES in OM1)   . Cardiac catheterization N/A 12/02/2014    Procedure: Left Heart Cath and Coronary Angiography;  Surgeon: Leonie Man, MD;  Location: [place] [facility];  Service: Cardiovascular;  Previous Stents in LAD & Cx-OM patent, mLAD prox to stents 99%  . Cardiac catheterization N/A 12/02/2014    Procedure: Coronary Stent Intervention;  Surgeon: Leonie Man, MD;  Location: [place] [facility];  Service: Cardiovascular;  3rd overlapping (prox) mLAD Promus Premier DES 3.0 x 20 (3.25 mm)  . Transthoracic echocardiogram  5/19 & 6/ 2016    a) EF 65-70%, mild LVH, Gr 1 DD; b) EF 60% -ron RWMA, no valve lesions   Family History  Problem  Relation Age of Onset  . Hyperlipidemia Mother   . Sudden death Father   . Emphysema Maternal Grandmother     smoked   Social History  Substance Use Topics  . Smoking status: Former Smoker -- 1.50 packs/day for 39 years    Types: Cigarettes    Quit date: 11/10/2014  . Smokeless tobacco: Never Used  . Alcohol Use: No     Comment: 11/15/2014 "might have a few drinks/yr"   OB History    No data available     Review of Systems  Cardiovascular: Positive for chest pain.  All other systems reviewed and are negative.     Allergies  Corticosteroids and Penicillins  Home Medications   Prior to Admission medications   Medication Sig Start Date End Date Taking? Authorizing Provider   aspirin EC 81 MG EC tablet Take 1 tablet (81 mg total) by mouth daily. 11/18/14   Debbe Odea, MD  atorvastatin (LIPITOR) 40 MG tablet TAKE 2 TABLETS BY MOUTH DAILY AT 6 PM. 06/23/15   Arnoldo Morale, MD  carvedilol (COREG) 3.125 MG tablet Take 1 tablet (3.125 mg total) by mouth 2 (two) times daily with a meal. 06/23/15   Arnoldo Morale, MD  clopidogrel (PLAVIX) 75 MG tablet Take 1 tablet (75 mg total) by mouth daily. 01/05/15   Josalyn Funches, MD  furosemide (LASIX) 40 MG tablet Take 1 tablet (40 mg total) by mouth daily. 06/15/15   Eileen Stanford, PA-C  glucose monitoring kit (FREESTYLE) monitoring kit 1 each by Does not apply route 4 (four) times daily - after meals and at bedtime. 1 month Diabetic Testing Supplies for QAC-QHS accuchecks. 11/16/14   Debbe Odea, MD  Insulin Detemir (LEVEMIR) 100 UNIT/ML Pen INJECT 35 UNITS INTO THE SKIN AT BEDTIME. 06/27/15   Arnoldo Morale, MD  Insulin Syringe-Needle U-100 (TRUEPLUS INSULIN SYRINGE) 30G X 5/16" 0.5 ML MISC 1 each by Does not apply route 4 (four) times daily. 03/02/15   Josalyn Funches, MD  isosorbide mononitrate (IMDUR) 30 MG 24 hr tablet Take 1 tablet (30 mg total) by mouth daily. 12/22/14   Leonie Man, MD  levothyroxine (SYNTHROID, LEVOTHROID) 200 MCG tablet Take 1 tablet (200 mcg total) by mouth daily before breakfast. 06/15/15   Eileen Stanford, PA-C  lisinopril (PRINIVIL,ZESTRIL) 10 MG tablet Take 10 mg by mouth daily.    Historical Provider, MD  meclizine (ANTIVERT) 25 MG tablet Take 1 tablet (25 mg total) by mouth 2 (two) times daily as needed for dizziness. 12/22/14   Leonie Man, MD  metFORMIN (GLUCOPHAGE) 500 MG tablet Take 1 tablet (500 mg total) by mouth 2 (two) times daily with a meal. 03/20/15   Boykin Nearing, MD  nitroGLYCERIN (NITROSTAT) 0.4 MG SL tablet Place 0.4 mg under the tongue every 5 (five) minutes as needed for chest pain (may take three doses by mouth per day as needed).    Historical Provider, MD   oxyCODONE-acetaminophen (PERCOCET/ROXICET) 5-325 MG tablet Take 1-2 tablets by mouth every 4 (four) hours as needed for severe pain. 06/21/15   Eileen Stanford, PA-C  potassium chloride SA (K-DUR,KLOR-CON) 20 MEQ tablet Take 1 tablet (20 mEq total) by mouth daily. Patient not taking: Reported on 08/21/2015 06/15/15   Eileen Stanford, PA-C   BP 129/81 mmHg  Pulse 71  Temp(Src) 98.8 F (37.1 C) (Oral)  Resp 16  SpO2 100% Physical Exam  Nursing note and vitals reviewed.  55 year old female, resting comfortably and in no  acute distress. Vital signs are normal. Oxygen saturation is 100%, which is normal. Head is normocephalic and atraumatic. PERRLA, EOMI. Oropharynx is clear. Neck is nontender and supple without adenopathy or JVD. Back is nontender and there is no CVA tenderness. Lungs are clear without rales, wheezes, or rhonchi. Chest has mild tenderness in the left upper chest wall anteriorly which does reproduce her pain. Heart has regular rate and rhythm without murmur. Abdomen is soft, flat, nontender without masses or hepatosplenomegaly and peristalsis is normoactive. Extremities have no cyanosis or edema, full range of motion is present. Skin is warm and dry without rash. Neurologic: Mental status is normal, cranial nerves are intact, there are no motor or sensory deficits.  ED Course  Procedures (including critical care time) Labs Review Results for orders placed or performed during the hospital encounter of 12/45/80  Basic metabolic panel  Result Value Ref Range   Sodium 136 135 - 145 mmol/L   Potassium 4.4 3.5 - 5.1 mmol/L   Chloride 99 (L) 101 - 111 mmol/L   CO2 24 22 - 32 mmol/L   Glucose, Bld 171 (H) 65 - 99 mg/dL   BUN 25 (H) 6 - 20 mg/dL   Creatinine, Ser 0.99 0.44 - 1.00 mg/dL   Calcium 9.9 8.9 - 10.3 mg/dL   GFR calc non Af Amer >60 >60 mL/min   GFR calc Af Amer >60 >60 mL/min   Anion gap 13 5 - 15  CBC  Result Value Ref Range   WBC 10.2 4.0 - 10.5 K/uL    RBC 4.06 3.87 - 5.11 MIL/uL   Hemoglobin 12.4 12.0 - 15.0 g/dL   HCT 35.8 (L) 36.0 - 46.0 %   MCV 88.2 78.0 - 100.0 fL   MCH 30.5 26.0 - 34.0 pg   MCHC 34.6 30.0 - 36.0 g/dL   RDW 13.4 11.5 - 15.5 %   Platelets 214 150 - 400 K/uL  I-stat troponin, ED (not at [facility], [facility])  Result Value Ref Range   Troponin i, poc 0.01 0.00 - 0.08 ng/mL   Comment 3          I-stat troponin, ED  Result Value Ref Range   Troponin i, poc 0.00 0.00 - 0.08 ng/mL   Comment 3           Imaging Review Dg Chest 2 View  08/21/2015  CLINICAL DATA:  54 year old presenting with acute onset of mid chest pain and shortness of breath which began earlier tonight. Current history of hypertension. Prior coronary artery stenting. EXAM: CHEST  2 VIEW COMPARISON:  06/12/2015 and earlier, including CTA chest 04/26/2015 and earlier. FINDINGS: Cardiac silhouette normal in size, decreased since the most recent prior examination. Stents in the LAD and left circumflex distribution again noted. Thoracic aorta minimally atherosclerotic, unchanged. Hilar and mediastinal contours otherwise unremarkable. Adjacent calcified granulomata in the right upper lobe. Lungs otherwise clear. Bronchovascular markings normal. Pulmonary vascularity normal. No visible pleural effusions. No pneumothorax. Mild degenerative changes involving the thoracic spine. IMPRESSION: No acute cardiopulmonary disease. Electronically Signed   By: Evangeline Dakin M.D.   On: 08/21/2015 21:33   I have personally reviewed and evaluated these images and lab results as part of my medical decision-making.   EKG Interpretation   Date/Time:  Monday August 21 2015 20:57:42 EST Ventricular Rate:  79 PR Interval:  134 QRS Duration: 76 QT Interval:  364 QTC Calculation: 417 R Axis:   41 Text Interpretation:  Normal sinus rhythm Low voltage QRS Borderline  ECG  When compared with ECG of 06/12/2015, No significant change was found  Confirmed by [facility]  MD, Shiloh Southern ([id number]) on  08/22/2015 3:31:17 AM       EKG Interpretation  Date/Time:  Tuesday August 22 2015 06:58:57 EST Ventricular Rate:  67 PR Interval:  137 QRS Duration: 83 QT Interval:  386 QTC Calculation: 407 R Axis:   34 Text Interpretation:  Sinus rhythm Low voltage, precordial leads Baseline wander in lead(s) I II aVR aVF V1 V3 V6 No significant change since last tracing Confirmed by [facility]  MD, Masen Salvas ([id number]) on 08/22/2015 7:36:27 AM Also confirmed by Roxanne Mins  MD, Hailley Byers ([id number]), editor [place]  CCT, BEVERLY ([id number])  on 08/22/2015 8:08:33 AM       MDM   Final diagnoses:  Chest pain, unspecified chest pain type    Chest pain of uncertain cause. No acute ECG G changes and troponin is negative 2. However, she is having ongoing pain. Old records are reviewed and she has been admitted twice with non-STEMI's and had stents placed and also had a recent hospitalization for new onset CHF. She will be given aspirin and nitroglycerin and ECG repeated.  Nitroglycerin did not affect her chest pain but did give her a severe headache. Repeat ECG shows no new changes. And concerned about her ongoing pain and third troponin is ordered. Case is signed out to Dr. Billy Fischer.  Delora Fuel, MD 41/96/22 [id number]

## 2015-08-22 NOTE — ED Notes (Signed)
Pts name called for vital sign assessment but no answer.

## 2015-08-24 ENCOUNTER — Encounter: Payer: Self-pay | Admitting: Cardiology

## 2015-08-24 ENCOUNTER — Ambulatory Visit (INDEPENDENT_AMBULATORY_CARE_PROVIDER_SITE_OTHER): Payer: Medicare Other | Admitting: Cardiology

## 2015-08-24 VITALS — BP 94/60 | HR 90 | Ht 62.0 in | Wt 190.3 lb

## 2015-08-24 DIAGNOSIS — I10 Essential (primary) hypertension: Secondary | ICD-10-CM | POA: Diagnosis not present

## 2015-08-24 DIAGNOSIS — E782 Mixed hyperlipidemia: Secondary | ICD-10-CM

## 2015-08-24 DIAGNOSIS — I5032 Chronic diastolic (congestive) heart failure: Secondary | ICD-10-CM | POA: Diagnosis not present

## 2015-08-24 DIAGNOSIS — Z9861 Coronary angioplasty status: Secondary | ICD-10-CM

## 2015-08-24 DIAGNOSIS — I2510 Atherosclerotic heart disease of native coronary artery without angina pectoris: Secondary | ICD-10-CM

## 2015-08-24 DIAGNOSIS — I952 Hypotension due to drugs: Secondary | ICD-10-CM

## 2015-08-24 DIAGNOSIS — I214 Non-ST elevation (NSTEMI) myocardial infarction: Secondary | ICD-10-CM | POA: Diagnosis not present

## 2015-08-24 DIAGNOSIS — E669 Obesity, unspecified: Secondary | ICD-10-CM

## 2015-08-24 DIAGNOSIS — Z87891 Personal history of nicotine dependence: Secondary | ICD-10-CM
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
## 2015-08-24 NOTE — Patient Instructions (Signed)
IF BLOOD PRESSURE ARE EQUAL OR BELOW 95  SYSTOLIC ( TOP NUMBER)   TAKE 1/2 DOSE OF LISINOPRIL THAT DAY.  MAY TAKE EXTRA LASIX AS NEEDED, JUST LIKE YOU AND DR HARDING  DISCUSS.   Your physician wants you to follow-up in MAY 2017  If you need a refill on your cardiac medications before your next appointment, please call your pharmacy.

## 2015-08-25 ENCOUNTER — Telehealth: Payer: Self-pay | Admitting: Family Medicine

## 2015-08-25 ENCOUNTER — Other Ambulatory Visit: Payer: Self-pay | Admitting: Family Medicine

## 2015-08-25 MED FILL — LEVOTHYROXINE 200 MCG TAB: 200 | 30 days supply | Qty: 30 | Fill #2

## 2015-08-25 MED FILL — FUROSEMIDE 40 MG TABLET: 40 | 30 days supply | Qty: 30 | Fill #2

## 2015-08-25 MED FILL — LISINOPRIL 10 MG TABLET: 10 | 30 days supply | Qty: 30 | Fill #3

## 2015-08-25 MED FILL — metFORMIN HCL 500 MG TABS: 500 | 30 days supply | Qty: 60 | Fill #4

## 2015-08-25 MED FILL — CARVEDILOL 3.125 MG TABLET: 3.125 | 30 days supply | Qty: 60 | Fill #0

## 2015-08-27 ENCOUNTER — Encounter: Payer: Self-pay | Admitting: Cardiology

## 2015-08-27 DIAGNOSIS — I214 Non-ST elevation (NSTEMI) myocardial infarction: Secondary | ICD-10-CM | POA: Insufficient documentation

## 2015-08-27 NOTE — Assessment & Plan Note (Signed)
She has been seen in the neurology clinic. Following results of CT scan she likely also has some COPD component.

## 2015-08-27 NOTE — Assessment & Plan Note (Signed)
Stable symptoms now on current dose of Lasix. On about as much dose of carvedilol and lisinopril as I can get her on. Actually managed to back off on the lisinopril some. We discussed sliding scale Lasix with increasing when necessary doses for edema or weight gain.

## 2015-08-27 NOTE — Assessment & Plan Note (Signed)
She is on atorvastatin 80 mg daily. LDL looked at by last check. We can recheck last see her in follow-up.

## 2015-08-27 NOTE — Assessment & Plan Note (Signed)
She has lots of diffuse different symptoms, but nothing cyst suggests similarity to her MI related anginal pain. Doing better on Plavix. I told her that if she has significant bruising she can hold aspirin. She is now on 80 mg of atorvastatin. On low-dose beta blocker and ACE inhibitor. She is also on low-dose Imdur

## 2015-08-27 NOTE — Assessment & Plan Note (Signed)
Borderline hypotension today. May need to reduce dose of lisinopril as noted

## 2015-08-27 NOTE — Assessment & Plan Note (Signed)
Supposedly she was changed from ACE inhibitor to ARB, however she remains on lisinopril. We may need to reduce this dose by half based on her borderline hypotension now. I told her for blood pressures less than 95 mmHg systolic, she should reduce her dose of lisinopril to one half dose. If this happens more often than not, will simply reduce her dose.

## 2015-08-27 NOTE — Assessment & Plan Note (Signed)
She has lost some weight with dietary adjustment. She needs continue staying active and watch her diet. Hopefully she will lose some weight.

## 2015-08-29 DIAGNOSIS — N644 Mastodynia: Secondary | ICD-10-CM | POA: Diagnosis not present

## 2015-09-05 ENCOUNTER — Encounter: Payer: Self-pay | Admitting: Allergy and Immunology

## 2015-09-05 ENCOUNTER — Telehealth: Payer: Self-pay | Admitting: *Deleted

## 2015-09-05 ENCOUNTER — Ambulatory Visit (INDEPENDENT_AMBULATORY_CARE_PROVIDER_SITE_OTHER): Payer: Medicare Other | Admitting: Allergy and Immunology

## 2015-09-05 VITALS — BP 108/70 | HR 76 | Temp 98.7°F | Resp 20 | Ht 61.22 in | Wt 191.1 lb

## 2015-09-05 DIAGNOSIS — Z7722 Contact with and (suspected) exposure to environmental tobacco smoke (acute) (chronic): Secondary | ICD-10-CM

## 2015-09-05 DIAGNOSIS — Z889 Allergy status to unspecified drugs, medicaments and biological substances status: Secondary | ICD-10-CM

## 2015-09-05 NOTE — Patient Instructions (Addendum)
  1. Arrange for in clinic injectable steroid challenge using incremental challenge protocol

## 2015-09-05 NOTE — Telephone Encounter (Signed)
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  NURSE ADVISED THAT KENALOG FOR LUMBER AND DEXAMETHASONE FOR CERVICAL.  SHE ADVISED THAT CHART INDICATES PROBABLE LUMBAR WILL BRING KENALOG FROM [facility] THEN CALL AND SCHEDULE FOR CHALLENGE. DR OFFICE REQUESTED WE FAX RESULTS TO [phone]

## 2015-09-05 NOTE — Progress Notes (Signed)
Dear Dr. Adrian Blackwater,  Thank you for referring Jacqueline Munoz to the [facility] on 09/05/2015.   Below is a summation of this patient's evaluation and recommendations.  Thank you for your referral. I will keep you informed about this patient's response to treatment.   If you have any questions please to do hestitate to contact me.   Sincerely,  Jiles Prows, MD [place]   ______________________________________________________________________    NEW PATIENT NOTE  Referring Provider: Boykin Nearing, MD Primary Provider: [name], MD Date of office visit: 09/05/2015    Subjective:   Chief Complaint:  Jacqueline Munoz (DOB: 1960-10-18) is a 55 y.o. female with a chief complaint of Medication Reaction  who presents to the clinic on 09/05/2015 with the following problems:  HPI Comments: Jacqueline Munoz presents this clinic in evaluation of a drug allergy. Approximately 25 years ago Jacqueline Munoz was given a prednisone Dosepak for a history of recurrent otitis media and ETD. Jacqueline Munoz developed a rash on her arms and chest with the second dose and Jacqueline Munoz discontinued this agent. About 6 weeks later Jacqueline Munoz had another episode of otitis media and was once again given prednisone Dosepak and this time Jacqueline Munoz developed a rash on the first day and difficulty breathing. Jacqueline Munoz stopped the agent at that point in time. Many years later Jacqueline Munoz was given topical eardrops with the steroid and developed significant swelling of her ear canal which cleared after discontinuing the medication. Many years after that Jacqueline Munoz was given eyedrops for pinkeye and within 10 minutes Jacqueline Munoz developed breathing problems and a rash. Apparently Jacqueline Munoz will require a steroid injection in her back for her very significant DDD.  Jacqueline Munoz also appears to have a history of allergic rhinoconjunctivitis for which Jacqueline Munoz underwent a course of immunotherapy many years ago. This helped  tremendously and now all Jacqueline Munoz has is some very mild seasonal allergies that Jacqueline Munoz treats with as needed use of antihistamine.  Jacqueline Munoz tells me that Jacqueline Munoz has had some type of problem with her vocal cords in the past. Jacqueline Munoz visited with the voice disorder Center up at [facility] and apparently they offered a surgical procedure for what sounds like removal of possible vocal cord polyps.   Past Medical History  Diagnosis Date  . Fibromyalgia   . History of tobacco abuse   . Hypothyroid   . OSA (obstructive sleep apnea)     a. does not wear CPAP  . History of stomach ulcers   . Arthritis   . Chronic back pain   . Chronic depression   . Kidney stones   . Essential hypertension   . HLD (hyperlipidemia)   . CAD S/P percutaneous coronary angioplasty     a. NSTEMI: s/p DES x2 LAD and DES to bifurcation of OM1 and mid to distal LCx (11/16/14) b. UA/ACS:  overlapping DES x1 to LAD for de novo 99% focal lesion in LAD (12/02/2014)  . Coronary artery chronic total occlusion: 100% RCA CTO 11/16/2014    a. Prox RCA to Mid RCA lesion, 100% stenosed.   . Type IVa MI - Peri-PCI 11/17/2014    Prolonged ischemia during bifurcation PCI of  m-d Cx-OM1 with minicrush technique  . Diabetes mellitus, type 2 ([place])   . HLD (hyperlipidemia)     Past Surgical History  Procedure Laterality Date  . Cesarean section  2000  . Inner ear surgery Bilateral "several"  . Tonsillectomy    .  Total abdominal hysterectomy  ~ 2008  . Tubal ligation  2000  . Cardiac catheterization N/A 11/16/2014    Procedure: Left Heart Cath and Coronary Angiography;  Surgeon: Peter M Martinique, MD;  Location: [facility];  Service: Cardiovascular;  RCA 100% CTO, m-dLAD 90%, m-dCx 95%, OM1 90%  . Cardiac catheterization  11/16/2014    Procedure: Coronary Stent Intervention;  Surgeon: Peter M Martinique, MD;  Location: [facility];  Service: Cardiovascular;;m-d LAD overlapping 3.0 x 16 & 2.75 x 16 Promus P DES; OM1-dCx Minicrush bifurcation  (dCx 2.25 x 38 Synergy DES crushed with 2.75 x 24 Synergy DES in OM1)   . Cardiac catheterization N/A 12/02/2014    Procedure: Left Heart Cath and Coronary Angiography;  Surgeon: Leonie Man, MD;  Location: [facility];  Service: Cardiovascular;  Previous Stents in LAD & Cx-OM patent, mLAD prox to stents 99%  . Cardiac catheterization N/A 12/02/2014    Procedure: Coronary Stent Intervention;  Surgeon: Leonie Man, MD;  Location: [facility];  Service: Cardiovascular;  3rd overlapping (prox) mLAD Promus Premier DES 3.0 x 20 (3.25 mm)  . Transthoracic echocardiogram  5/19 & 6/ 2016    a) EF 65-70%, mild LVH, Gr 1 DD; b) EF 60% -ron RWMA, no valve lesions    Outpatient Prescriptions Prior to Visit  Medication Sig Dispense Refill  . aspirin EC 81 MG EC tablet Take 1 tablet (81 mg total) by mouth daily. 30 tablet 0  . atorvastatin (LIPITOR) 40 MG tablet TAKE 2 TABLETS BY MOUTH DAILY AT 6 PM. 60 tablet 2  . carvedilol (COREG) 3.125 MG tablet Take 1 tablet (3.125 mg total) by mouth 2 (two) times daily with a meal. 60 tablet 0  . clopidogrel (PLAVIX) 75 MG tablet Take 1 tablet (75 mg total) by mouth daily. 30 tablet 2  . furosemide (LASIX) 40 MG tablet Take 1 tablet (40 mg total) by mouth daily. 30 tablet 6  . Insulin Detemir (LEVEMIR) 100 UNIT/ML Pen INJECT 35 UNITS INTO THE SKIN AT BEDTIME. 15 mL 1  . isosorbide mononitrate (IMDUR) 30 MG 24 hr tablet Take 1 tablet (30 mg total) by mouth daily. 30 tablet 6  . levothyroxine (SYNTHROID, LEVOTHROID) 200 MCG tablet Take 1 tablet (200 mcg total) by mouth daily before breakfast. 30 tablet 6  . lisinopril (PRINIVIL,ZESTRIL) 10 MG tablet Take 10 mg by mouth daily.    . metFORMIN (GLUCOPHAGE) 500 MG tablet Take 1 tablet (500 mg total) by mouth 2 (two) times daily with a meal. 60 tablet 5  . nitroGLYCERIN (NITROSTAT) 0.4 MG SL tablet Place 0.4 mg under the tongue every 5 (five) minutes as needed for chest pain (may take three doses by mouth per  day as needed).    [name] oxyCODONE-acetaminophen (PERCOCET) 10-325 MG tablet Take 3 tablets by mouth daily.    [name] tiZANidine (ZANAFLEX) 2 MG tablet Take 2 tablets by mouth daily.    [name] glucose monitoring kit (FREESTYLE) monitoring kit 1 each by Does not apply route 4 (four) times daily - after meals and at bedtime. 1 month Diabetic Testing Supplies for QAC-QHS accuchecks. 1 each 1  . Insulin Syringe-Needle U-100 (TRUEPLUS INSULIN SYRINGE) 30G X 5/16" 0.5 ML MISC 1 each by Does not apply route 4 (four) times daily. 100 each 11  . NONFORMULARY OR COMPOUNDED ITEM Apply 1 application topically 3 (three) times daily as needed (pain). Reported on 09/05/2015    . oxyCODONE-acetaminophen (PERCOCET/ROXICET)  5-325 MG tablet Take 1-2 tablets by mouth every 4 (four) hours as needed for severe pain. (Patient not taking: Reported on 09/05/2015) 30 tablet 0   No facility-administered medications prior to visit.    Allergies  Allergen Reactions  . Corticosteroids Anaphylaxis    ALL steroids.     . Penicillins Anaphylaxis    Has patient had a PCN reaction causing immediate rash, facial/tongue/throat swelling, SOB or lightheadedness with hypotension: {Yes Has patient had a PCN reaction causing severe rash involving mucus membranes or skin necrosis: NO Has patient had a PCN reaction that required hospitalization NO Has patient had a PCN reaction occurring within the last 10 years: {Yes If all of the above answers are "NO", then may proceed with Cephalosporin use.   . Amitriptyline Other (See Comments)    Muscle Cramps    Review of systems negative except as noted in HPI / PMHx or noted below:  Review of Systems  Constitutional: Negative.   HENT: Negative.   Eyes: Negative.   Respiratory: Negative.   Cardiovascular: Negative.   Gastrointestinal: Negative.   Genitourinary: Negative.   Musculoskeletal: Negative.   Skin: Negative.   Neurological: Negative.   Endo/Heme/Allergies: Negative.     Psychiatric/Behavioral: Negative.     Family History  Problem Relation Age of Onset  . Hyperlipidemia Mother   . Sudden death Father   . Emphysema Maternal Grandmother     smoked    Social History   Social History  . Marital Status: Widowed    Spouse Name: N/A  . Number of Children: N/A  . Years of Education: N/A   Occupational History  . Not on file.   Social History Main Topics  . Smoking status: Former Smoker -- 1.50 packs/day for 39 years    Types: Cigarettes    Quit date: 11/10/2014  . Smokeless tobacco: Never Used  . Alcohol Use: No     Comment: 11/15/2014 "might have a few drinks/yr"  . Drug Use: No     Comment: last marijuana use 03/18/2015  . Sexual Activity: Not on file   Other Topics Concern  . Not on file   Social History Narrative    Environmental and Social history  Lives in a apartment with a dry environment, dogs and cats located inside the household, carpeting in the bedroom, no plastic on the bed or pillow, and smokers located inside the household. Jacqueline Munoz is disabled.   Objective:   Filed Vitals:   09/05/15 1352  BP: 108/70  Pulse: 76  Temp: 98.7 F (37.1 C)  Resp: 20   Height: 5' 1.22" (155.5 cm) Weight: 191 lb 2.2 oz (86.7 kg)  Physical Exam  Constitutional: Jacqueline Munoz is well-developed, well-nourished, and in no distress.  Very raspy voice  HENT:  Head: Normocephalic. Head is without right periorbital erythema and without left periorbital erythema.  Right Ear: Tympanic membrane, external ear and ear canal normal.  Left Ear: Tympanic membrane, external ear and ear canal normal.  Nose: Nose normal. No mucosal edema or rhinorrhea.  Mouth/Throat: Oropharynx is clear and moist and mucous membranes are normal. No oropharyngeal exudate.  Eyes: Conjunctivae and lids are normal. Pupils are equal, round, and reactive to light.  Neck: Trachea normal. No tracheal deviation present. No thyromegaly present.  Cardiovascular: Normal rate, regular rhythm,  S1 normal, S2 normal and normal heart sounds.   No murmur heard. Pulmonary/Chest: Effort normal. No stridor. No tachypnea. No respiratory distress. Jacqueline Munoz has no wheezes. Jacqueline Munoz has no rales. Jacqueline Munoz  exhibits no tenderness.  Abdominal: Soft. Jacqueline Munoz exhibits no distension and no mass. There is no hepatosplenomegaly. There is no tenderness. There is no rebound and no guarding.  Musculoskeletal: Jacqueline Munoz exhibits no edema or tenderness.  Lymphadenopathy:       Head (right side): No tonsillar adenopathy present.       Head (left side): No tonsillar adenopathy present.    Jacqueline Munoz has no cervical adenopathy.    Jacqueline Munoz has no axillary adenopathy.  Neurological: Jacqueline Munoz is alert. Gait normal.  Skin: No rash noted. Jacqueline Munoz is not diaphoretic. No erythema. No pallor. Nails show no clubbing.  Psychiatric: Mood and affect normal.     Diagnostics:  None  Assessment and Plan:    1. Drug allergy   2. Second hand tobacco smoke exposure     1. Arrange for in clinic injectable steroid challenge using incremental challenge protocol    We will find out what agent is planned on being injected into her back and give her an incremental challenge using in incremental challenge protocol sometime in this clinic over the next several weeks. This approaches the most definitive way to work through whether or not Jacqueline Munoz has a true hypersensitivity reaction directed against this agent.   Jiles Prows, MD [facility]

## 2015-09-15 ENCOUNTER — Encounter: Payer: Medicare Other | Admitting: Obstetrics & Gynecology

## 2015-09-19 DIAGNOSIS — G894 Chronic pain syndrome: Secondary | ICD-10-CM | POA: Diagnosis not present

## 2015-09-19 DIAGNOSIS — Z79899 Other long term (current) drug therapy: Secondary | ICD-10-CM | POA: Diagnosis not present

## 2015-09-19 DIAGNOSIS — M545 Low back pain: Secondary | ICD-10-CM | POA: Diagnosis not present

## 2015-09-19 DIAGNOSIS — M79606 Pain in leg, unspecified: Secondary | ICD-10-CM | POA: Diagnosis not present

## 2015-09-19 DIAGNOSIS — M1288 Other specific arthropathies, not elsewhere classified, other specified site: Secondary | ICD-10-CM | POA: Diagnosis not present

## 2015-09-19 DIAGNOSIS — E669 Obesity, unspecified: Secondary | ICD-10-CM | POA: Diagnosis not present

## 2015-10-02 ENCOUNTER — Other Ambulatory Visit: Payer: Self-pay | Admitting: Cardiology

## 2015-10-02 MED FILL — CARVEDILOL 3.125 MG TABLET: 3.125 | 30 days supply | Qty: 60 | Fill #1

## 2015-10-02 MED FILL — FUROSEMIDE 40 MG TABLET: 40 | 30 days supply | Qty: 30 | Fill #3

## 2015-10-02 MED FILL — LEVOTHYROXINE 200 MCG TAB: 200 | 30 days supply | Qty: 30 | Fill #3

## 2015-10-03 ENCOUNTER — Ambulatory Visit (INDEPENDENT_AMBULATORY_CARE_PROVIDER_SITE_OTHER): Payer: Medicare Other | Admitting: Allergy and Immunology

## 2015-10-03 ENCOUNTER — Encounter: Payer: Self-pay | Admitting: Allergy and Immunology

## 2015-10-03 VITALS — BP 130/82 | HR 72 | Resp 20

## 2015-10-03 DIAGNOSIS — Z889 Allergy status to unspecified drugs, medicaments and biological substances status: Secondary | ICD-10-CM | POA: Diagnosis not present

## 2015-10-03 DIAGNOSIS — L500 Allergic urticaria: Secondary | ICD-10-CM

## 2015-10-03 NOTE — Progress Notes (Signed)
Jacqueline Munoz presents this clinic to have a drug challenge administered. Utilizing Kenalog Injection solution at 40 mg per mL She was initially tested with epicutaneous utilizing full-strength Kenalog which did not identify any hypersensitivity. She then was socially administered 4 mg of Kenalog IM and observed for 30 minutes and then administered 36 mg of Kenalog IM and observed for an additional 60 minutes. At no point in time did she have her have any adverse effects secondary to this exposure.

## 2015-10-06 ENCOUNTER — Telehealth: Payer: Self-pay | Admitting: Clinical

## 2015-10-06 NOTE — Telephone Encounter (Signed)
F/u with pt, no voicemail set up, no message left

## 2015-10-17 ENCOUNTER — Encounter: Payer: Self-pay | Admitting: Family Medicine

## 2015-10-17 ENCOUNTER — Ambulatory Visit (HOSPITAL_BASED_OUTPATIENT_CLINIC_OR_DEPARTMENT_OTHER): Payer: Medicare Other | Admitting: Clinical

## 2015-10-17 ENCOUNTER — Ambulatory Visit: Payer: Medicare Other | Attending: Family Medicine | Admitting: Family Medicine

## 2015-10-17 VITALS — BP 132/87 | HR 71 | Temp 98.8°F | Resp 16 | Ht 62.0 in | Wt 184.0 lb

## 2015-10-17 DIAGNOSIS — R229 Localized swelling, mass and lump, unspecified: Secondary | ICD-10-CM | POA: Diagnosis not present

## 2015-10-17 DIAGNOSIS — Z7984 Long term (current) use of oral hypoglycemic drugs: Secondary | ICD-10-CM | POA: Insufficient documentation

## 2015-10-17 DIAGNOSIS — Z87891 Personal history of nicotine dependence: Secondary | ICD-10-CM | POA: Insufficient documentation

## 2015-10-17 DIAGNOSIS — Z794 Long term (current) use of insulin: Secondary | ICD-10-CM | POA: Insufficient documentation

## 2015-10-17 DIAGNOSIS — B029 Zoster without complications: Secondary | ICD-10-CM | POA: Insufficient documentation

## 2015-10-17 DIAGNOSIS — Z79899 Other long term (current) drug therapy: Secondary | ICD-10-CM | POA: Insufficient documentation

## 2015-10-17 DIAGNOSIS — E119 Type 2 diabetes mellitus without complications: Secondary | ICD-10-CM | POA: Diagnosis not present

## 2015-10-17 DIAGNOSIS — Z7982 Long term (current) use of aspirin: Secondary | ICD-10-CM | POA: Insufficient documentation

## 2015-10-17 DIAGNOSIS — F4322 Adjustment disorder with anxiety: Secondary | ICD-10-CM

## 2015-10-17 LAB — POCT GLYCOSYLATED HEMOGLOBIN (HGB A1C): HEMOGLOBIN A1C: 7.4

## 2015-10-17 LAB — GLUCOSE, POCT (MANUAL RESULT ENTRY): POC GLUCOSE: 220 mg/dL — AB (ref 70–99)

## 2015-10-17 MED ORDER — FLUCONAZOLE 150 MG PO TABS: 150.0000 mg | ORAL_TABLET | ORAL | Status: DC

## 2015-10-17 MED ORDER — ACYCLOVIR 400 MG PO TABS: 400.0000 mg | ORAL_TABLET | Freq: Three times a day (TID) | ORAL | Status: DC

## 2015-10-17 MED FILL — ACYCLOVIR 400 MG TABLET: 400 | 5 days supply | Qty: 15 | Fill #0

## 2015-10-17 MED FILL — ISOSORBIDE MN ER 30 MG TAB: 30 | 30 days supply | Qty: 30 | Fill #0

## 2015-10-17 MED FILL — ATORVASTATIN 40 MG TABLET: 40 | 30 days supply | Qty: 60 | Fill #2

## 2015-10-17 MED FILL — FLUCONAZOLE 150 MG TABLET: 150 | 5 days supply | Qty: 2 | Fill #0

## 2015-10-17 NOTE — Assessment & Plan Note (Signed)
Acyclovir for 5 days to prevent shingles outbreak

## 2015-10-17 NOTE — Assessment & Plan Note (Signed)
Subcutaneous nodules [place] ordered

## 2015-10-17 NOTE — Progress Notes (Signed)
Subjective:  Patient ID: Jacqueline Munoz, female    DOB: [birth year]  Age: 55 y.o. MRN: [id number]  CC: Cyst   HPI Jacqueline Munoz presents for   1. Subcutaneous nodules: x 2 in LUQ. Tender. She denies trauma. She has a similar tender nodule in L breast.   2. Shingles: she has burning sensation on L mid back. No rash. Burning sensation for past 3 days. She has had shingles 3 times. The last time she reports not having an outbreak as she was quickly started on antiviral.   3. Yeast infection: requesting diflucan for intermittent yeast infections.   Social History  Substance Use Topics  . Smoking status: Former Smoker -- 1.50 packs/day for 39 years    Types: Cigarettes    Quit date: 11/10/2014  . Smokeless tobacco: Never Used  . Alcohol Use: No     Comment: 11/15/2014 "might have a few drinks/yr"    Outpatient Prescriptions Prior to Visit  Medication Sig Dispense Refill  . aspirin EC 81 MG EC tablet Take 1 tablet (81 mg total) by mouth daily. 30 tablet 0  . atorvastatin (LIPITOR) 40 MG tablet TAKE 2 TABLETS BY MOUTH DAILY AT 6 PM. 60 tablet 2  . carvedilol (COREG) 3.125 MG tablet Take 1 tablet (3.125 mg total) by mouth 2 (two) times daily with a meal. 60 tablet 0  . clopidogrel (PLAVIX) 75 MG tablet Take 1 tablet (75 mg total) by mouth daily. 30 tablet 2  . furosemide (LASIX) 40 MG tablet Take 1 tablet (40 mg total) by mouth daily. 30 tablet 6  . glucose monitoring kit (FREESTYLE) monitoring kit 1 each by Does not apply route 4 (four) times daily - after meals and at bedtime. 1 month Diabetic Testing Supplies for QAC-QHS accuchecks. 1 each 1  . Insulin Detemir (LEVEMIR) 100 UNIT/ML Pen INJECT 35 UNITS INTO THE SKIN AT BEDTIME. 15 mL 1  . Insulin Syringe-Needle U-100 (TRUEPLUS INSULIN SYRINGE) 30G X 5/16" 0.5 ML MISC 1 each by Does not apply route 4 (four) times daily. 100 each 11  . isosorbide mononitrate (IMDUR) 30 MG 24 hr tablet TAKE 1 TABLET BY MOUTH DAILY. 30 tablet 10  .  levothyroxine (SYNTHROID, LEVOTHROID) 200 MCG tablet Take 1 tablet (200 mcg total) by mouth daily before breakfast. 30 tablet 6  . lisinopril (PRINIVIL,ZESTRIL) 10 MG tablet Take 10 mg by mouth daily.    . metFORMIN (GLUCOPHAGE) 500 MG tablet Take 1 tablet (500 mg total) by mouth 2 (two) times daily with a meal. 60 tablet 5  . nitroGLYCERIN (NITROSTAT) 0.4 MG SL tablet Place 0.4 mg under the tongue every 5 (five) minutes as needed for chest pain (may take three doses by mouth per day as needed).    . NONFORMULARY OR COMPOUNDED ITEM Apply 1 application topically 3 (three) times daily as needed (pain). Reported on 09/05/2015    . oxyCODONE-acetaminophen (PERCOCET) 10-325 MG tablet Take 3 tablets by mouth daily.    [name] tiZANidine (ZANAFLEX) 2 MG tablet Take 2 tablets by mouth daily.     No facility-administered medications prior to visit.    ROS Review of Systems  Constitutional: Negative for fever and chills.  Eyes: Negative for visual disturbance.  Respiratory: Negative for shortness of breath.   Cardiovascular: Negative for chest pain.  Gastrointestinal: Negative for abdominal pain and blood in stool.  Musculoskeletal: Positive for myalgias. Negative for back pain and arthralgias.  Skin: Negative for rash.  Allergic/Immunologic: Negative for immunocompromised state.  Neurological:  LUQ pain   Hematological: Negative for adenopathy. Does not bruise/bleed easily.  Psychiatric/Behavioral: Negative for suicidal ideas and dysphoric mood.    Objective:  BP 132/87 mmHg  Pulse 71  Temp(Src) 98.8 F (37.1 C) (Oral)  Resp 16  Ht 5' 2" (1.575 m)  Wt 184 lb (83.462 kg)  BMI 33.65 kg/m2  SpO2 97%  BP/Weight 10/17/2015 12/03/9933 7/0/1779  Systolic BP 390 300 923  Diastolic BP 87 82 70  Wt. (Lbs) 184 - 191.14  BMI 33.65 - 35.86   Physical Exam  Constitutional: She is oriented to person, place, and time. She appears well-developed and well-nourished. No distress.  HENT:  Head:  Normocephalic and atraumatic.  Cardiovascular: Normal rate, regular rhythm, normal heart sounds and intact distal pulses.   Pulmonary/Chest: Effort normal and breath sounds normal.  Musculoskeletal: She exhibits no edema.  Neurological: She is alert and oriented to person, place, and time.  Skin: Skin is warm and dry. No rash noted.  Psychiatric: She has a normal mood and affect.   Lab Results  Component Value Date   HGBA1C 7.4 10/17/2015   CBG 220 Assessment & Plan:  Jacqueline Munoz was seen today for cyst.  Diagnoses and all orders for this visit:  Type 2 diabetes mellitus without complication, unspecified long term insulin use status ([place]) -     POCT glycosylated hemoglobin (Hb A1C) -     POCT glucose (manual entry) -     fluconazole (DIFLUCAN) 150 MG tablet; Take 1 tablet (150 mg total) by mouth every 3 (three) days.  Herpes zoster -     acyclovir (ZOVIRAX) 400 MG tablet; Take 1 tablet (400 mg total) by mouth 3 (three) times daily.  Subcutaneous nodules -     US Abdomen Complete; Future   There are no diagnoses linked to this encounter.  No orders of the defined types were placed in this encounter.    Follow-up: No Follow-up on file.   Boykin Nearing MD

## 2015-10-17 NOTE — Progress Notes (Signed)
Possible shingles on back  Knot on lt side of torso  Pain scale # 7 No tobacco user  No suicidal thoughts in the past two weeks

## 2015-10-17 NOTE — Progress Notes (Signed)
ASSESSMENT: Pt experiencing Adjustment reaction with anxiety, needs to f/u with PCP, and would benefit from brief therapeutic interventions regarding coping with adjustment and anxiety.  Stage of Change: contemplative  PLAN: 1. F/U with behavioral health consultant in as needed 2. Psychiatric Medications: none. 3. Behavioral recommendation(s):   -Take home food resources today -Move into new home with a positive outlook SUBJECTIVE: Pt. referred by Dr Adrian Blackwater for anxiety/life stressors:  Pt. reports the following symptoms/concerns: Pt states that she thinks that the stress and anxiety she is feeling has triggered her most recent shingles outbreak; she is being evicted from her home, and has decided that moving into a hotel, temporarily, will alleviate a great deal of the "stress and anxiety" she is feeling, says she "is like a gypsy, and just the thought of downsizing feels good, less stressful". She is keeping a positive outlook on her upcoming change in living conditions, yet does experience food insecurity. Duration of problem: Over one week (eviction notice increase in anxiety) Severity: moderate  OBJECTIVE: Orientation & Cognition: Oriented x3. Thought processes normal and appropriate to situation. Mood: appropriate. Affect: appropriate Appearance: appropriate Risk of harm to self or others: no known risk of harm to self or others Substance use: tobacco Assessments administered: none  Diagnosis: Adjustment reaction with anxiety CPT Code: F43.22 -------------------------------------------- Other(s) present in the room: none  Time spent with patient in exam room: 20 minutes, 4:15-4:35pm   Depression screen [facility] 2/9 08/21/2015 07/20/2015 06/27/2015 03/20/2015 01/20/2015  Decreased Interest 3 3 3 3  0  Down, Depressed, Hopeless 3 3 3 3  0  PHQ - 2 Score 6 6 6 6  0  Altered sleeping 3 3 3 3  -  Tired, decreased energy 3 3 3 3  -  Change in appetite 3 3 2 2  -  Feeling bad or failure about  yourself  3 3 3 3  -  Trouble concentrating 3 3 2 2  -  Moving slowly or fidgety/restless 2 2 1 3  -  Suicidal thoughts 2 (No Data) 0 1 -  PHQ-9 Score 25 23 20 23  -    GAD 7 : Generalized Anxiety Score 08/21/2015 07/20/2015 06/27/2015  Nervous, Anxious, on Edge 3 3 1   Control/stop worrying 3 3 3   Worry too much - different things 3 3 3   Trouble relaxing 3 3 3   Restless 1 3 0  Easily annoyed or irritable 2 3 3   Afraid - awful might happen 3 3 3   Total GAD 7 Score 18 21 16

## 2015-10-17 NOTE — Patient Instructions (Addendum)
Jacqueline Munoz was seen today for cyst.  Diagnoses and all orders for this visit:  Type 2 diabetes mellitus without complication, unspecified long term insulin use status ([facility]) -     POCT glycosylated hemoglobin (Hb A1C) -     POCT glucose (manual entry) -     fluconazole (DIFLUCAN) 150 MG tablet; Take 1 tablet (150 mg total) by mouth every 3 (three) days.  Herpes zoster -     acyclovir (ZOVIRAX) 400 MG tablet; Take 1 tablet (400 mg total) by mouth 3 (three) times daily.  Subcutaneous nodules -     US Abdomen Complete; Future    Please schedule f/u with me in   Dr. Adrian Blackwater

## 2015-10-23 ENCOUNTER — Ambulatory Visit (HOSPITAL_COMMUNITY): Payer: Medicare Other

## 2015-10-30 ENCOUNTER — Encounter (HOSPITAL_COMMUNITY): Payer: Self-pay | Admitting: Emergency Medicine

## 2015-10-30 ENCOUNTER — Emergency Department (HOSPITAL_COMMUNITY)
Admission: EM | Admit: 2015-10-30 | Discharge: 2015-10-30 | Disposition: A | Discharge status: Home / Self Care | Payer: Medicare Other | Attending: Emergency Medicine | Admitting: Emergency Medicine

## 2015-10-30 DIAGNOSIS — Z87891 Personal history of nicotine dependence: Secondary | ICD-10-CM | POA: Diagnosis not present

## 2015-10-30 DIAGNOSIS — Z794 Long term (current) use of insulin: Secondary | ICD-10-CM | POA: Diagnosis not present

## 2015-10-30 DIAGNOSIS — I252 Old myocardial infarction: Secondary | ICD-10-CM | POA: Diagnosis not present

## 2015-10-30 DIAGNOSIS — Z79899 Other long term (current) drug therapy: Secondary | ICD-10-CM | POA: Insufficient documentation

## 2015-10-30 DIAGNOSIS — Z8719 Personal history of other diseases of the digestive system: Secondary | ICD-10-CM | POA: Insufficient documentation

## 2015-10-30 DIAGNOSIS — Z880 Allergy status to penicillin: Secondary | ICD-10-CM | POA: Insufficient documentation

## 2015-10-30 DIAGNOSIS — F1190 Opioid use, unspecified, uncomplicated: Secondary | ICD-10-CM | POA: Insufficient documentation

## 2015-10-30 DIAGNOSIS — Z9889 Other specified postprocedural states: Secondary | ICD-10-CM | POA: Insufficient documentation

## 2015-10-30 DIAGNOSIS — G8929 Other chronic pain: Secondary | ICD-10-CM | POA: Insufficient documentation

## 2015-10-30 DIAGNOSIS — I10 Essential (primary) hypertension: Secondary | ICD-10-CM | POA: Insufficient documentation

## 2015-10-30 DIAGNOSIS — E119 Type 2 diabetes mellitus without complications: Secondary | ICD-10-CM | POA: Insufficient documentation

## 2015-10-30 DIAGNOSIS — I2510 Atherosclerotic heart disease of native coronary artery without angina pectoris: Secondary | ICD-10-CM | POA: Diagnosis not present

## 2015-10-30 DIAGNOSIS — E785 Hyperlipidemia, unspecified: Secondary | ICD-10-CM | POA: Insufficient documentation

## 2015-10-30 DIAGNOSIS — Z87442 Personal history of urinary calculi: Secondary | ICD-10-CM | POA: Diagnosis not present

## 2015-10-30 DIAGNOSIS — Z7984 Long term (current) use of oral hypoglycemic drugs: Secondary | ICD-10-CM | POA: Insufficient documentation

## 2015-10-30 DIAGNOSIS — E039 Hypothyroidism, unspecified: Secondary | ICD-10-CM | POA: Insufficient documentation

## 2015-10-30 DIAGNOSIS — Z7982 Long term (current) use of aspirin: Secondary | ICD-10-CM | POA: Insufficient documentation

## 2015-10-30 DIAGNOSIS — M1990 Unspecified osteoarthritis, unspecified site: Secondary | ICD-10-CM | POA: Insufficient documentation

## 2015-10-30 DIAGNOSIS — R454 Irritability and anger: Secondary | ICD-10-CM | POA: Insufficient documentation

## 2015-10-30 DIAGNOSIS — Z7902 Long term (current) use of antithrombotics/antiplatelets: Secondary | ICD-10-CM | POA: Diagnosis not present

## 2015-10-30 DIAGNOSIS — F1120 Opioid dependence, uncomplicated: Secondary | ICD-10-CM | POA: Diagnosis not present

## 2015-10-30 NOTE — ED Provider Notes (Signed)
CSN: [id number]     Arrival date & time 10/30/15  0040 History   First MD Initiated Contact with Patient 10/30/15 0335     Chief Complaint  Patient presents with  . opiate detox      (Consider location/radiation/quality/duration/timing/severity/associated sxs/prior Treatment) HPI   Patient is a 55 year old female states she was kicked out of her pain clinic, stating "it's not my fault" and she has run out of pills as of yesterday, after a missed pill count where she was discharged from pain management. [facility] to be admitted for inpatient opiate detox. She complains of one hour of diarrhea, pain all over her body pain described as burning and otherwise denies any other symptoms at this time.  She denies CP, SOB, HA, abdominal pain, N, V.  She states she is angry at the Dr. that kicked her out of pain management, but she denies HI, SI, AVH.  She states "you wonder why there is an epidemic, this is why, this right here.  Doctors like him make [place] do heroine cause they won't treat our chronic pain."    Past Medical History  Diagnosis Date  . Fibromyalgia   . History of tobacco abuse   . Hypothyroid   . OSA (obstructive sleep apnea)     a. does not wear CPAP  . History of stomach ulcers   . Arthritis   . Chronic back pain   . Chronic depression   . Kidney stones   . Essential hypertension   . HLD (hyperlipidemia)   . CAD S/P percutaneous coronary angioplasty     a. NSTEMI: s/p DES x2 LAD and DES to bifurcation of OM1 and mid to distal LCx (11/16/14) b. UA/ACS:  overlapping DES x1 to LAD for de novo 99% focal lesion in LAD (12/02/2014)  . Coronary artery chronic total occlusion: 100% RCA CTO 11/16/2014    a. Prox RCA to Mid RCA lesion, 100% stenosed.   . Type IVa MI - Peri-PCI 11/17/2014    Prolonged ischemia during bifurcation PCI of  m-d Cx-OM1 with minicrush technique  . Diabetes mellitus, type 2 ([place])   . HLD (hyperlipidemia)    Past Surgical History   Procedure Laterality Date  . Cesarean section  2000  . Inner ear surgery Bilateral "several"  . Tonsillectomy    . Total abdominal hysterectomy  ~ 2008  . Tubal ligation  2000  . Cardiac catheterization N/A 11/16/2014    Procedure: Left Heart Cath and Coronary Angiography;  Surgeon: Peter M Martinique, MD;  Location: [facility];  Service: Cardiovascular;  RCA 100% CTO, m-dLAD 90%, m-dCx 95%, OM1 90%  . Cardiac catheterization  11/16/2014    Procedure: Coronary Stent Intervention;  Surgeon: Peter M Martinique, MD;  Location: [facility];  Service: Cardiovascular;;m-d LAD overlapping 3.0 x 16 & 2.75 x 16 Promus P DES; OM1-dCx Minicrush bifurcation (dCx 2.25 x 38 Synergy DES crushed with 2.75 x 24 Synergy DES in OM1)   . Cardiac catheterization N/A 12/02/2014    Procedure: Left Heart Cath and Coronary Angiography;  Surgeon: Leonie Man, MD;  Location: [facility];  Service: Cardiovascular;  Previous Stents in LAD & Cx-OM patent, mLAD prox to stents 99%  . Cardiac catheterization N/A 12/02/2014    Procedure: Coronary Stent Intervention;  Surgeon: Leonie Man, MD;  Location: [facility];  Service: Cardiovascular;  3rd overlapping (prox) mLAD Promus Premier DES 3.0 x 20 (  3.25 mm)  . Transthoracic echocardiogram  5/19 & 6/ 2016    a) EF 65-70%, mild LVH, Gr 1 DD; b) EF 60% -ron RWMA, no valve lesions   Family History  Problem Relation Age of Onset  . Hyperlipidemia Mother   . Sudden death Father   . Emphysema Maternal Grandmother     smoked   Social History  Substance Use Topics  . Smoking status: Former Smoker -- 1.50 packs/day for 39 years    Types: Cigarettes    Quit date: 11/10/2014  . Smokeless tobacco: Never Used  . Alcohol Use: No     Comment: 11/15/2014 "might have a few drinks/yr"   OB History    No data available     Review of Systems  All other systems reviewed and are negative.     Allergies  Corticosteroids; Penicillins; and  Amitriptyline  Home Medications   Prior to Admission medications   Medication Sig Start Date End Date Taking? Authorizing Provider  acyclovir (ZOVIRAX) 400 MG tablet Take 1 tablet (400 mg total) by mouth 3 (three) times daily. 10/17/15  Yes Dessa Phi, MD  aspirin EC 81 MG EC tablet Take 1 tablet (81 mg total) by mouth daily. 11/18/14  Yes Calvert Cantor, MD  atorvastatin (LIPITOR) 40 MG tablet TAKE 2 TABLETS BY MOUTH DAILY AT 6 PM. 08/25/15  Yes Josalyn Funches, MD  carvedilol (COREG) 3.125 MG tablet Take 1 tablet (3.125 mg total) by mouth 2 (two) times daily with a meal. 06/23/15  Yes Jaclyn Shaggy, MD  clopidogrel (PLAVIX) 75 MG tablet Take 1 tablet (75 mg total) by mouth daily. 01/05/15  Yes Josalyn Funches, MD  fluconazole (DIFLUCAN) 150 MG tablet Take 1 tablet (150 mg total) by mouth every 3 (three) days. 10/17/15  Yes Josalyn Funches, MD  furosemide (LASIX) 40 MG tablet Take 1 tablet (40 mg total) by mouth daily. 06/15/15  Yes Janetta Hora, PA-C  glucose monitoring kit (FREESTYLE) monitoring kit 1 each by Does not apply route 4 (four) times daily - after meals and at bedtime. 1 month Diabetic Testing Supplies for QAC-QHS accuchecks. 11/16/14  Yes Calvert Cantor, MD  Insulin Detemir (LEVEMIR) 100 UNIT/ML Pen INJECT 35 UNITS INTO THE SKIN AT BEDTIME. 06/27/15  Yes Jaclyn Shaggy, MD  Insulin Syringe-Needle U-100 (TRUEPLUS INSULIN SYRINGE) 30G X 5/16" 0.5 ML MISC 1 each by Does not apply route 4 (four) times daily. 03/02/15  Yes Josalyn Funches, MD  isosorbide mononitrate (IMDUR) 30 MG 24 hr tablet TAKE 1 TABLET BY MOUTH DAILY. 10/03/15  Yes Marykay Lex, MD  levothyroxine (SYNTHROID, LEVOTHROID) 200 MCG tablet Take 1 tablet (200 mcg total) by mouth daily before breakfast. 06/15/15  Yes Janetta Hora, PA-C  lisinopril (PRINIVIL,ZESTRIL) 10 MG tablet Take 10 mg by mouth daily.   Yes Historical Provider, MD  metFORMIN (GLUCOPHAGE) 500 MG tablet Take 1 tablet (500 mg total) by mouth 2 (two)  times daily with a meal. 03/20/15  Yes Josalyn Funches, MD  nitroGLYCERIN (NITROSTAT) 0.4 MG SL tablet Place 0.4 mg under the tongue every 5 (five) minutes as needed for chest pain (may take three doses by mouth per day as needed).   Yes Historical Provider, MD  oxyCODONE-acetaminophen (PERCOCET) 10-325 MG tablet Take 3 tablets by mouth daily. 08/22/15  Yes Historical Provider, MD   BP 135/88 mmHg  Pulse 92  Temp(Src) 98.8 F (37.1 C) (Oral)  Resp 20  SpO2 99% Physical Exam  Constitutional: She is oriented to person, place,  and time. She appears well-developed and well-nourished. No distress.  HENT:  Head: Normocephalic and atraumatic.  Nose: Nose normal.  Mouth/Throat: Oropharynx is clear and moist. No oropharyngeal exudate.  Eyes: Conjunctivae and EOM are normal. Pupils are equal, round, and reactive to light. Right eye exhibits no discharge. Left eye exhibits no discharge. No scleral icterus.  Neck: Normal range of motion. No JVD present. No tracheal deviation present. No thyromegaly present.  Cardiovascular: Normal rate, regular rhythm, normal heart sounds, intact distal pulses and normal pulses.  Exam reveals no gallop and no friction rub.   No murmur heard. Pulmonary/Chest: Effort normal and breath sounds normal. No accessory muscle usage. No tachypnea. No respiratory distress. She has no decreased breath sounds. She has no wheezes. She has no rhonchi. She has no rales. She exhibits no tenderness.  Abdominal: Soft. Bowel sounds are normal. She exhibits no distension and no mass. There is no tenderness. There is no rebound and no guarding.  Musculoskeletal: Normal range of motion. She exhibits no edema or tenderness.  Lymphadenopathy:    She has no cervical adenopathy.  Neurological: She is alert and oriented to person, place, and time. She has normal strength and normal reflexes. She displays no tremor. No cranial nerve deficit or sensory deficit. She exhibits normal muscle tone. She  displays no seizure activity. Coordination normal.  Skin: Skin is warm and dry. No rash noted. She is not diaphoretic. No erythema. No pallor.  Psychiatric: Her speech is normal and behavior is normal. Judgment and thought content normal. Her affect is angry. Cognition and memory are normal.  Nursing note and vitals reviewed.   ED Course  Procedures (including critical care time) Labs Review Labs Reviewed - No data to display  Imaging Review No results found. I have personally reviewed and evaluated these images and lab results as part of my medical decision-making.   EKG Interpretation None      MDM   Pt ran out of pain medication and presents to [facility] requesting inpt opiate detox.   She is well appearing, alert, calm and talkative until she became angry and expressed frustration about have to go through withdrawal because "It's not my fault" she got kicked out of pain management.  Pt's VSS, she was given outpt resources.  Return precautions reviewed.   Final diagnoses:  Chronic narcotic use        Delsa Grana, PA-C 10/30/15 0406  Varney Biles, MD 10/30/15 [phone]

## 2015-10-30 NOTE — Discharge Instructions (Signed)
Community Resource Guide Outpatient Counseling/Substance Abuse Adult °The United Way’s “211” is a great source of information about community services available.  Access by dialing 2-1-1 from anywhere in [place], or by website -  www.nc211.org.  ° °Other Local Resources (Updated 07/2015) ° °Crisis Hotlines °  °Services  ° °  °Area Served  °[facility] • Crisis Hotline, available 24 hours a day, 7 days a week: 800-939-5911 [place], [place]  ° [facility] • Crisis Hotline, available 24 hours a day, 7 days a week: 866-275-9552 [place], [place]  °[facility] • Suicide Prevention Hotline, available 24 hours a day, 7 days a week: 800-273-8255 [place], [place]  °[facility] ° • Crisis Hotline, available 24 hours a day, 7 days a week: 336-676-6840 [place], [place] °  °[facility] • Crisis Hotline, available 24 hours a day, 7 days a week: 800-256-2452 All °  °[facility] • Crisis Hotline, available 24 hours a day, 7 days a week: 877-626-1772 All  ° °Other Local Resources (Updated 07/2015) ° °Outpatient Counseling/ Substance Abuse Programs  °Services  ° °  °Address and Phone Number  °[facility] ° • Options include Individual counseling, group counseling, intensive outpatient program (several hours a day, several days a week) °• Offers depression assessments °• Provides methadone maintenance program 336-333-6860 °[address], [address] °[place], [place] [address] °  °[facility] ° • Offers partial hospitalization/day treatment and DUI/DWI programs °• Accepts Medicare, private insurance [phone] °[address], [address] °[place], [place] [address]  °[facility] ° ° • Services include intensive outpatient program (several hours a day, several days a week), outpatient treatment, DUI/DWI services, family education °• Also has some services specifically for Veterans °• Offers transitional housing   [phone] °[address] °[place], [place] [address] °  °  °[facility] • Accepts Medicare, private pay, and private insurance [phone] °[address] °[place], [place] [address]  °[facility] • Services include individual counseling, substance abuse intensive outpatient program (several hours a day, several days a week), day treatment °• Accepts Medicare, Medicaid, private insurance [phone] °[address] °[place], [place] [address]  °[facility] ° • Offers substance abuse intensive outpatient program (several hours a day, several days a week), partial hospitalization program [phone] °[address] °[place], [place] [address] ° °[phone] °[address] °[place], [place] [address] ° °[phone] °[address] °[place], [place] [address] ° °[phone] °[address] °[place], [place] [address]  °[facility] • Individual counseling only °• Accepts private insurance only [phone] °[address] °[place], [place] [address]  °[facility] • Methadone maintenance program [phone] °[address] °[place], [place] [address]  °[facility] • Walk-In Clinic providing substance abuse and mental health counseling °• Accepts Medicaid, Medicare, private insurance °• Offers sliding scale for uninsured 336-342-8316 °[address] °[place], [place]   °[facility] • Offers individual counseling, and intensive in-home services [phone] °[address] °[place], [place] [address]  °[facility] • Offers individual counseling, family counseling, group therapy, domestic violence counseling, consumer credit counseling °• Accepts Medicare, Medicaid, private insurance °• Offers sliding scale for uninsured [phone] °[address] °[place], [place] [address] ° °[phone] °[place], [address] °[place], [place] [address]  °[facility] • Offers individual, family   and group counseling  3 locations - [place], [place], and [place]  [address] [place], [place] [address]  [address] [place], [place] [address]  [place], [place] [address]  [facility]    Offers psychiatric assessment, 8-week Intensive Outpatient Program (several hours a day, several times a week, daytime or evenings), early recovery group, family Program, medication management  Private pay or private insurance only [phone], or  [phone] [address] [place], [place] [address]  [facility]  Offers individual, couples and family counseling  Accepts Medicaid, private insurance, and sliding scale for uninsured [phone] [address], [place] [address]  Launa Flight, MD  Individual counseling  Private insurance [phone] [place], [place] [address]  [facility]   Offers assessment, substance abuse treatment, and behavioral health treatment [phone] [place] [place], [place] [address]  [facility]  Individual counseling  Accepts private insurance [phone] [place], [place] [address]  [facility]  Individual counseling  Blinda [place], private insurance [phone] [place], [place] [address]  [facility]    Offers intensive outpatient program (several hours a day, several times a week)  Private pay, private insurance [phone] [place], [place]  Individual counseling  Medicare, private insurance [phone] [address], [place], [place] [address]  [facility]    Offers intensive outpatient program (several hours a day, several times a week) and partial hospitalization  program [phone] [place], [place] [address]  Letta Moynahan, MD  Individual counseling [phone] [address], [place], [place] [address]  [facility] counseling to individuals, couples, and families  Accepts Medicare and private insurance; offers sliding scale for uninsured [phone] [place], [place] [address]  [facility]  Christian counseling [phone] [address] [place], [place] [address]  [facility] crisis counseling, individual counseling, group therapy, in-home therapy, domestic violence services, day treatment, DWI services, Conservation officer, nature (CST), Assertive Community Treatment Team (ACTT), substance abuse Intensive Outpatient Program (several hours a day, several times a week)  2 locations - [place] and [place], [place] [address]  [phone] [address] [place], [place] [address]  [place] counseling and group therapy  [employer] insurance, [place], [place] [phone] [address] [place], [place]  [facility]  Offers individual and family counseling  Offers LGBTQ services  Accepts private insurance and private pay [phone] [place], [place] [address]  [facility]    Offers individual counseling, group therapy, and outpatient detox  Accepts private insurance [phone] [place], [place] Medicare, private insurance [phone] [address], [place], [place] [address]  [employer]  Individual counseling  Accepts Medicare, private insurance [phone] [address] [place],  [address]  [facility] [place] substance abuse  Intensive Outpatient Program (several hours a day, several times a week) [phone], or [phone] [place], [place]    Opioid Withdrawal Opioids are a group of narcotic drugs. They include the street drug heroin. They also include pain medicines, such as morphine, hydrocodone, oxycodone, and fentanyl. Opioid withdrawal is a group of characteristic  physical and mental signs and symptoms. It typically occurs if you have been using opioids daily for several weeks or longer and stop using or rapidly decrease use. Opioid withdrawal can also occur if you have used opioids daily for a long time and are given a medicine to block the effect.  SIGNS AND SYMPTOMS Opioid withdrawal includes three or more of the following symptoms:   Depressed, anxious, or irritable mood.  Nausea or vomiting.  Muscle aches or spasms.   Watery eyes.   Runny nose.  Dilated pupils, sweating, or hairs standing on end.  Diarrhea or intestinal cramping.  Yawning.   Fever.  Increased blood pressure.  Fast pulse.  Restlessness or trouble sleeping. These signs and symptoms occur within several hours of stopping or reducing short-acting opioids, such as heroin. They can occur within 3 days of stopping or reducing long-acting opioids, such as methadone. Withdrawal begins within minutes of receiving a drug that blocks the effects of opioids, such as naltrexone or naloxone. DIAGNOSIS  Opioid use disorder is diagnosed by your health care provider. You will be asked about your symptoms, drug and alcohol use, medical history, and use of medicines. A physical exam may be done. Lab tests may be ordered. Your health care provider may have you see a mental health professional.  TREATMENT  The treatment for opioid withdrawal is usually provided by medical doctors with special training in substance use disorders (addiction specialists). The following medicines may be included in treatment:  Opioids given in place of the  abused opioid. They turn on opioid receptors in the brain and lessen or prevent withdrawal symptoms. They are gradually decreased (opioid substitution and taper).  Non-opioids that can lessen certain opioid withdrawal symptoms. They may be used alone or with opioid substitution and taper. Successful long-term recovery usually requires medicine, counseling, and group support. HOME CARE INSTRUCTIONS   Take medicines only as directed by your health care provider.  Check with your health care provider before starting new medicines.  Keep all follow-up visits as directed by your health care provider. SEEK MEDICAL CARE IF:  You are not able to take your medicines as directed.  Your symptoms get worse.  You relapse. SEEK IMMEDIATE MEDICAL CARE IF:  You have serious thoughts about hurting yourself or others.  You have a seizure.  You lose consciousness.   This information is not intended to replace advice given to you by your health care provider. Make sure you discuss any questions you have with your health care provider.   Document Released: 06/20/2003 Document Revised: 07/08/2014 Document Reviewed: 06/30/2013 Elsevier Interactive Patient Education [employer].

## 2015-10-30 NOTE — ED Notes (Signed)
Patient was given xl scrubs to change out.

## 2015-10-30 NOTE — ED Notes (Addendum)
Patient states that she was going to a pain clinic for fibromyalgia, arthritis in chest and ribs, and multiple cysts, low back pain. Patient states she "missed a pill count" earlier in April and when she went for her next appt she was discharged as a patient. Patient states she took her last Percocet at about 0700 on 10/29/2015.  Patient has been advised that [facility] no longer participates in inpatient opiate detox.

## 2015-11-14 MED FILL — FUROSEMIDE 40 MG TABLET: 40 | 30 days supply | Qty: 30 | Fill #4

## 2015-11-14 MED FILL — metFORMIN HCL 500 MG TABS: 500 | 30 days supply | Qty: 60 | Fill #5

## 2015-11-14 MED FILL — CLOPIDOGREL 75 MG TABLET: 75 | 90 days supply | Qty: 90 | Fill #3

## 2015-11-14 MED FILL — LEVOTHYROXINE 200 MCG TAB: 200 | 30 days supply | Qty: 30 | Fill #4

## 2015-11-17 DIAGNOSIS — F1123 Opioid dependence with withdrawal: Secondary | ICD-10-CM | POA: Diagnosis not present

## 2015-11-17 DIAGNOSIS — F1210 Cannabis abuse, uncomplicated: Secondary | ICD-10-CM | POA: Diagnosis present

## 2015-11-17 DIAGNOSIS — F332 Major depressive disorder, recurrent severe without psychotic features: Secondary | ICD-10-CM | POA: Diagnosis not present

## 2015-11-23 ENCOUNTER — Ambulatory Visit: Payer: Medicare Other | Admitting: Cardiology

## 2015-11-23 ENCOUNTER — Encounter: Payer: Self-pay | Admitting: *Deleted

## 2015-11-25 ENCOUNTER — Emergency Department (HOSPITAL_COMMUNITY): Payer: Medicare Other

## 2015-11-25 ENCOUNTER — Emergency Department (HOSPITAL_COMMUNITY)
Admission: EM | Admit: 2015-11-25 | Discharge: 2015-11-25 | Disposition: A | Discharge status: Home / Self Care | Payer: Medicare Other | Attending: Emergency Medicine | Admitting: Emergency Medicine

## 2015-11-25 ENCOUNTER — Encounter (HOSPITAL_COMMUNITY): Payer: Self-pay

## 2015-11-25 ENCOUNTER — Encounter (HOSPITAL_COMMUNITY): Payer: Self-pay | Admitting: Emergency Medicine

## 2015-11-25 DIAGNOSIS — E119 Type 2 diabetes mellitus without complications: Secondary | ICD-10-CM | POA: Insufficient documentation

## 2015-11-25 DIAGNOSIS — E785 Hyperlipidemia, unspecified: Secondary | ICD-10-CM | POA: Insufficient documentation

## 2015-11-25 DIAGNOSIS — Z7984 Long term (current) use of oral hypoglycemic drugs: Secondary | ICD-10-CM | POA: Diagnosis not present

## 2015-11-25 DIAGNOSIS — R1084 Generalized abdominal pain: Secondary | ICD-10-CM | POA: Diagnosis not present

## 2015-11-25 DIAGNOSIS — Z79899 Other long term (current) drug therapy: Secondary | ICD-10-CM | POA: Insufficient documentation

## 2015-11-25 DIAGNOSIS — Z7982 Long term (current) use of aspirin: Secondary | ICD-10-CM | POA: Diagnosis not present

## 2015-11-25 DIAGNOSIS — Z8719 Personal history of other diseases of the digestive system: Secondary | ICD-10-CM | POA: Diagnosis not present

## 2015-11-25 DIAGNOSIS — Z7902 Long term (current) use of antithrombotics/antiplatelets: Secondary | ICD-10-CM | POA: Insufficient documentation

## 2015-11-25 DIAGNOSIS — Z880 Allergy status to penicillin: Secondary | ICD-10-CM | POA: Diagnosis not present

## 2015-11-25 DIAGNOSIS — M797 Fibromyalgia: Secondary | ICD-10-CM | POA: Diagnosis not present

## 2015-11-25 DIAGNOSIS — Z794 Long term (current) use of insulin: Secondary | ICD-10-CM | POA: Insufficient documentation

## 2015-11-25 DIAGNOSIS — G8929 Other chronic pain: Secondary | ICD-10-CM | POA: Insufficient documentation

## 2015-11-25 DIAGNOSIS — E039 Hypothyroidism, unspecified: Secondary | ICD-10-CM | POA: Insufficient documentation

## 2015-11-25 DIAGNOSIS — I10 Essential (primary) hypertension: Secondary | ICD-10-CM | POA: Insufficient documentation

## 2015-11-25 DIAGNOSIS — R490 Dysphonia: Secondary | ICD-10-CM | POA: Diagnosis not present

## 2015-11-25 DIAGNOSIS — Z9861 Coronary angioplasty status: Secondary | ICD-10-CM | POA: Diagnosis not present

## 2015-11-25 DIAGNOSIS — F329 Major depressive disorder, single episode, unspecified: Secondary | ICD-10-CM | POA: Insufficient documentation

## 2015-11-25 DIAGNOSIS — R0602 Shortness of breath: Secondary | ICD-10-CM | POA: Insufficient documentation

## 2015-11-25 DIAGNOSIS — M1990 Unspecified osteoarthritis, unspecified site: Secondary | ICD-10-CM | POA: Insufficient documentation

## 2015-11-25 DIAGNOSIS — Z87442 Personal history of urinary calculi: Secondary | ICD-10-CM | POA: Diagnosis not present

## 2015-11-25 DIAGNOSIS — Z87891 Personal history of nicotine dependence: Secondary | ICD-10-CM | POA: Diagnosis not present

## 2015-11-25 DIAGNOSIS — R062 Wheezing: Secondary | ICD-10-CM | POA: Diagnosis not present

## 2015-11-25 DIAGNOSIS — Z9889 Other specified postprocedural states: Secondary | ICD-10-CM | POA: Insufficient documentation

## 2015-11-25 DIAGNOSIS — I2510 Atherosclerotic heart disease of native coronary artery without angina pectoris: Secondary | ICD-10-CM | POA: Insufficient documentation

## 2015-11-25 DIAGNOSIS — R079 Chest pain, unspecified: Secondary | ICD-10-CM | POA: Diagnosis not present

## 2015-11-25 DIAGNOSIS — I252 Old myocardial infarction: Secondary | ICD-10-CM | POA: Insufficient documentation

## 2015-11-25 DIAGNOSIS — G4733 Obstructive sleep apnea (adult) (pediatric): Secondary | ICD-10-CM | POA: Diagnosis not present

## 2015-11-25 LAB — CBC
HCT: 42.5 % (ref 36.0–46.0)
Hemoglobin: 14.3 g/dL (ref 12.0–15.0)
MCH: 30.1 pg (ref 26.0–34.0)
MCHC: 33.6 g/dL (ref 30.0–36.0)
MCV: 89.5 fL (ref 78.0–100.0)
PLATELETS: 280 10*3/uL (ref 150–400)
RBC: 4.75 MIL/uL (ref 3.87–5.11)
RDW: 12.8 % (ref 11.5–15.5)
WBC: 11.4 10*3/uL — AB (ref 4.0–10.5)

## 2015-11-25 LAB — BASIC METABOLIC PANEL
Anion gap: 9 (ref 5–15)
BUN: 8 mg/dL (ref 6–20)
CO2: 25 mmol/L (ref 22–32)
CREATININE: 0.67 mg/dL (ref 0.44–1.00)
Calcium: 9.5 mg/dL (ref 8.9–10.3)
Chloride: 106 mmol/L (ref 101–111)
GFR calc Af Amer: >60 mL/min (ref >60)
Glucose, Bld: 163 mg/dL — ABNORMAL HIGH (ref 65–99)
POTASSIUM: 3.5 mmol/L (ref 3.5–5.1)
SODIUM: 140 mmol/L (ref 135–145)

## 2015-11-25 LAB — I-STAT TROPONIN, ED: TROPONIN I, POC: 0.01 ng/mL (ref 0.00–0.08)

## 2015-11-25 LAB — PROTIME-INR
INR: 0.97 (ref 0.00–1.49)
PROTHROMBIN TIME: 13.1 s (ref 11.6–15.2)

## 2015-11-25 MED ORDER — DIPHENHYDRAMINE HCL 25 MG PO CAPS
50.0000 mg | ORAL_CAPSULE | Freq: Once | ORAL | Status: AC
Start: 2015-11-25 — End: 2015-11-25
  Administered 2015-11-25: 50 mg via ORAL
  Filled 2015-11-25: qty 2

## 2015-11-25 MED ORDER — SODIUM CHLORIDE 0.9 % IV BOLUS (SEPSIS)
1000.0000 mL | Freq: Once | INTRAVENOUS | Status: AC
  Administered 2015-11-25: 1000 mL via INTRAVENOUS

## 2015-11-25 MED ORDER — FAMOTIDINE IN NACL 20-0.9 MG/50ML-% IV SOLN
20.0000 mg | Freq: Once | INTRAVENOUS | Status: AC
  Administered 2015-11-25: 20 mg via INTRAVENOUS
  Filled 2015-11-25: qty 50

## 2015-11-25 NOTE — ED Notes (Signed)
The patient said she started having chest pain and sob.  The paient was seen here earlier for the same thing but she originally thought it was an allergic reaction.  She said she has had fluid in her lungs and she thinks that is what is happening now.  She advised she has CHF and wants MD to check if she has fluid in her lungs.  She rates her pain 10/10.

## 2015-11-25 NOTE — ED Provider Notes (Signed)
CSN: [id number]     Arrival date & time 11/25/15  1340 History   First MD Initiated Contact with Patient 11/25/15 1456     Chief Complaint  Patient presents with  . Allergic Reaction     (Consider location/radiation/quality/duration/timing/severity/associated sxs/prior Treatment) HPI Comments: 55 year old female with past medical history including chronic back and abdominal pain, CAD s/p stenting, hypertension, hyperlipidemia, OSA, fibromyalgia, type 2 diabetes mellitus who presents with concern for an allergic reaction. Asian states that she started taking Lyrica for the first time a few days ago. Last night she was sitting with friends and began having hoarseness of her voice. She states she later noticed swollen lips. She became concerned about an allergic reaction to the medication. She took Benadryl '50mg'$  at 10 AM. She feels like it is hard to breathe through her throat but denies any rash. No other significant symptoms; she has had a mild cough recently but denies any runny nose, itchy eyes, or sore throat. No fevers. Of note, the patient has chronic abdominal pain and has been off of opiates because she was kicked out of her pain clinic.  Patient is a 55 y.o. female presenting with allergic reaction. The history is provided by the patient.  Allergic Reaction   Past Medical History  Diagnosis Date  . Fibromyalgia   . History of tobacco abuse   . Hypothyroid   . OSA (obstructive sleep apnea)     a. does not wear CPAP  . History of stomach ulcers   . Arthritis   . Chronic back pain   . Chronic depression   . Kidney stones   . Essential hypertension   . HLD (hyperlipidemia)   . CAD S/P percutaneous coronary angioplasty     a. NSTEMI: s/p DES x2 LAD and DES to bifurcation of OM1 and mid to distal LCx (11/16/14) b. UA/ACS:  overlapping DES x1 to LAD for de novo 99% focal lesion in LAD (12/02/2014)  . Coronary artery chronic total occlusion: 100% RCA CTO 11/16/2014    a. Prox RCA to Mid  RCA lesion, 100% stenosed.   . Type IVa MI - Peri-PCI 11/17/2014    Prolonged ischemia during bifurcation PCI of  m-d Cx-OM1 with minicrush technique  . Diabetes mellitus, type 2 ([place])   . HLD (hyperlipidemia)    Past Surgical History  Procedure Laterality Date  . Cesarean section  2000  . Inner ear surgery Bilateral "several"  . Tonsillectomy    . Total abdominal hysterectomy  ~ 2008  . Tubal ligation  2000  . Cardiac catheterization N/A 11/16/2014    Procedure: Left Heart Cath and Coronary Angiography;  Surgeon: Peter M Martinique, MD;  Location: [facility];  Service: Cardiovascular;  RCA 100% CTO, m-dLAD 90%, m-dCx 95%, OM1 90%  . Cardiac catheterization  11/16/2014    Procedure: Coronary Stent Intervention;  Surgeon: Peter M Martinique, MD;  Location: [facility];  Service: Cardiovascular;;m-d LAD overlapping 3.0 x 16 & 2.75 x 16 Promus P DES; OM1-dCx Minicrush bifurcation (dCx 2.25 x 38 Synergy DES crushed with 2.75 x 24 Synergy DES in OM1)   . Cardiac catheterization N/A 12/02/2014    Procedure: Left Heart Cath and Coronary Angiography;  Surgeon: Leonie Man, MD;  Location: [facility];  Service: Cardiovascular;  Previous Stents in LAD & Cx-OM patent, mLAD prox to stents 99%  . Cardiac catheterization N/A 12/02/2014    Procedure: Coronary Stent Intervention;  Surgeon: Leonie Man, MD;  Location: [facility];  Service: Cardiovascular;  3rd overlapping (prox) mLAD Promus Premier DES 3.0 x 20 (3.25 mm)  . Transthoracic echocardiogram  5/19 & 6/ 2016    a) EF 65-70%, mild LVH, Gr 1 DD; b) EF 60% -ron RWMA, no valve lesions   Family History  Problem Relation Age of Onset  . Hyperlipidemia Mother   . Sudden death Father   . Emphysema Maternal Grandmother     smoked   Social History  Substance Use Topics  . Smoking status: Former Smoker -- 1.50 packs/day for 39 years    Types: Cigarettes    Quit date: 11/10/2014  . Smokeless tobacco: Never Used  . Alcohol  Use: No     Comment: 11/15/2014 "might have a few drinks/yr"   OB History    No data available     Review of Systems 10 Systems reviewed and are negative for acute change except as noted in the HPI.    Allergies  Corticosteroids; Lyrica; Penicillins; and Amitriptyline  Home Medications   Prior to Admission medications   Medication Sig Start Date End Date Taking? Authorizing Provider  aspirin EC 81 MG EC tablet Take 1 tablet (81 mg total) by mouth daily. 11/18/14  Yes Debbe Odea, MD  atorvastatin (LIPITOR) 40 MG tablet TAKE 2 TABLETS BY MOUTH DAILY AT 6 PM. 08/25/15  Yes Josalyn Funches, MD  carvedilol (COREG) 3.125 MG tablet Take 1 tablet (3.125 mg total) by mouth 2 (two) times daily with a meal. 06/23/15  Yes Arnoldo Morale, MD  clopidogrel (PLAVIX) 75 MG tablet Take 1 tablet (75 mg total) by mouth daily. 01/05/15  Yes Josalyn Funches, MD  FLUoxetine (PROZAC) 20 MG capsule Take 20 mg by mouth daily.   Yes Historical Provider, MD  furosemide (LASIX) 40 MG tablet Take 1 tablet (40 mg total) by mouth daily. 06/15/15  Yes Eileen Stanford, PA-C  glucose monitoring kit (FREESTYLE) monitoring kit 1 each by Does not apply route 4 (four) times daily - after meals and at bedtime. 1 month Diabetic Testing Supplies for QAC-QHS accuchecks. 11/16/14  Yes Debbe Odea, MD  hydrOXYzine (VISTARIL) 50 MG capsule Take 50 mg by mouth 3 (three) times daily as needed. stress 11/22/15  Yes Historical Provider, MD  Insulin Detemir (LEVEMIR) 100 UNIT/ML Pen INJECT 35 UNITS INTO THE SKIN AT BEDTIME. 06/27/15  Yes Arnoldo Morale, MD  Insulin Syringe-Needle U-100 (TRUEPLUS INSULIN SYRINGE) 30G X 5/16" 0.5 ML MISC 1 each by Does not apply route 4 (four) times daily. 03/02/15  Yes Josalyn Funches, MD  isosorbide mononitrate (IMDUR) 30 MG 24 hr tablet TAKE 1 TABLET BY MOUTH DAILY. 10/03/15  Yes Leonie Man, MD  levothyroxine (SYNTHROID, LEVOTHROID) 200 MCG tablet Take 1 tablet (200 mcg total) by mouth daily before  breakfast. 06/15/15  Yes Eileen Stanford, PA-C  lisinopril (PRINIVIL,ZESTRIL) 10 MG tablet Take 10 mg by mouth daily.   Yes Historical Provider, MD  metFORMIN (GLUCOPHAGE) 500 MG tablet Take 1 tablet (500 mg total) by mouth 2 (two) times daily with a meal. 03/20/15  Yes Josalyn Funches, MD  nitroGLYCERIN (NITROSTAT) 0.4 MG SL tablet Place 0.4 mg under the tongue every 5 (five) minutes as needed for chest pain (may take three doses by mouth per day as needed).   Yes Historical Provider, MD  acyclovir (ZOVIRAX) 400 MG tablet Take 1 tablet (400 mg total) by mouth 3 (three) times daily. Patient not taking: Reported on 11/25/2015 10/17/15   Josalyn Funches,  MD  fluconazole (DIFLUCAN) 150 MG tablet Take 1 tablet (150 mg total) by mouth every 3 (three) days. Patient not taking: Reported on 11/25/2015 10/17/15   Josalyn Funches, MD   BP 147/88 mmHg  Pulse 73  Temp(Src) 98.7 F (37.1 C) (Oral)  Resp 20  Wt 176 lb 3 oz (79.918 kg)  SpO2 96% Physical Exam  Constitutional: She is oriented to person, place, and time. She appears well-developed and well-nourished. No distress.  Whispering during interview  HENT:  Head: Normocephalic and atraumatic.  Mouth/Throat: Oropharynx is clear and moist.  Moist mucous membranes, no oropharyngeal or lip swelling  Eyes: Conjunctivae are normal. Pupils are equal, round, and reactive to light.  Neck: Neck supple.  Cardiovascular: Normal rate, regular rhythm and normal heart sounds.   No murmur heard. Pulmonary/Chest: Effort normal and breath sounds normal. She has no wheezes.  Abdominal: Soft. Bowel sounds are normal. She exhibits no distension. There is tenderness (generalized TTP). There is no rebound and no guarding.  Musculoskeletal: She exhibits no edema.  Neurological: She is alert and oriented to person, place, and time.  Fluent speech  Skin: Skin is warm and dry. No rash noted.  Psychiatric: She has a normal mood and affect. Judgment normal.  Nursing note  and vitals reviewed.   ED Course  Procedures (including critical care time) Labs Review Labs Reviewed - No data to display  Medications  sodium chloride 0.9 % bolus 1,000 mL (1,000 mLs Intravenous New Bag/Given 11/25/15 1453)  famotidine (PEPCID) IVPB 20 mg premix (20 mg Intravenous New Bag/Given 11/25/15 1453)     MDM   Final diagnoses:  None   Pt presents with hoarseness and concern for lip swelling that began yesterday. On exam, she was awake and alert, breathing comfortably on room air. Signs show mild hypertension, normal heart rate, O2 sat 98% on room air. She was whispering during my examination but had no oropharyngeal swelling, no rash or redness of mucosa or skin, and clear lungs bilaterally. Patient has already had Benadryl and reports an allergy to steroids. Therefore, gave Pepcid and a fluid bolus. Given that her symptoms began last night and she is well-appearing now, I doubt anaphylaxis. Because of concern of the medication being the cause, I have instructed her to discontinue the Lyrica and contact her PCP for further instructions. 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 I reviewed the patient's chart and recent allergist note states that she has had a problem with focal cords in the past and has followed with [facility]. I instructed her to follow-up with ENT if her hoarseness is on improve in the next week. Patient voiced understanding and discharged in satisfactory condition.  Sharlett Iles, MD 11/25/15 [phone]

## 2015-11-25 NOTE — Discharge Instructions (Signed)
Hoarseness  Hoarseness is any abnormal change in your voice.Hoarseness can make it difficult to speak. Your voice may sound raspy, breathy, or strained.  Hoarseness is caused by a problem with the vocal cords. The vocal cords are two bands of tissue inside your voice box (larynx). When you speak, your vocal cords move back and forth to create sound. The surfaces of your vocal cords need to be smooth for your voice to sound clear. Swelling or lumps on the vocal cords can cause hoarseness.  Common causes of vocal cord problems include:   Upper airway infection.   A long-term cough.   Straining or overusing your voice.   Smoking.   Allergies.   Vocal cord growths.   Stomach acids that flow up from your stomach and irritate your vocal cords (gastroesophageal reflux).  HOME CARE INSTRUCTIONS  Watch your condition for any changes. To ease any discomfort that you feel:   Rest your voice. Do not whisper. Whispering can cause muscle strain.   Do not speak in a loud or harsh voice that makes your hoarseness worse.   Do not use any tobacco products, including cigarettes, chewing tobacco, or electronic cigarettes. If you need help quitting, ask your health care provider.   Avoid secondhand smoke.   Do not eat foods that give you heartburn. Heartburn can make gastroesophageal reflux worse.   Do not drink coffee.   Do not drink alcohol.   Drink enough fluids to keep your urine clear or pale yellow.   Use a humidifier if the air in your home is dry.  SEEK MEDICAL CARE IF:   You have hoarseness that lasts longer than 3 weeks.   You almost lose or completelylose your voice for longer than 3 days.   You have pain when you swallow or try to talk.   You feel a lump in your neck.  SEEK IMMEDIATE MEDICAL CARE IF:   You have trouble swallowing.   You feel as though you are choking when you swallow.   You cough up blood or vomit blood.   You have trouble breathing.     This information is not intended to replace  advice given to you by your health care provider. Make sure you discuss any questions you have with your health care provider.     Document Released: 05/31/2005 Document Revised: 11/01/2014 Document Reviewed: 06/08/2014  Elsevier Interactive Patient Education 2016 [employer]

## 2015-11-25 NOTE — ED Notes (Signed)
Patient here with complaint of allergic reaction since last night. Has been on lyrica x 2 days and thinks its the medication. Patient with wheezing on expiration and states her lips are swollen, none noted. Throat/tongue no swelling. Took benadryl 50mg  at 10am.  No acute distress, no rash or redness noted

## 2015-11-25 NOTE — ED Notes (Signed)
Pt requesting Benadryl PTD.  MD made aware, orders obtained.

## 2015-11-25 NOTE — ED Notes (Signed)
Pt reports her mother is driving her home and her next dose of Benadryl is due at 4pm.

## 2015-11-25 NOTE — ED Provider Notes (Signed)
MSE was initiated and I personally evaluated the patient and placed orders (if any) at  2:47 PM on Nov 25, 2015.  Blood pressure 147/88, pulse 73, temperature 98.7 F (37.1 C), temperature source Oral, resp. rate 20, weight 79.918 kg, SpO2 96 %.  Jacqueline Munoz is a 55 y.o. female complaining of hoarse voice and difficulty breathing onset last night, she started Lyrica 2 days ago and think it may be an allergic reaction. Patient denies rash, pruritus, nausea, vomiting. She has associated symptoms of rhinorrhea. New  Patient with no rash or hives, no angioedema, lung sounds clear to auscultation. Conducted upper airway sounds on my exam. 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 She is a 30 had for Benadryl today at 10 AM.  The patient appears stable so that the remainder of the MSE may be completed by another provider.  Monico Blitz, PA-C 11/25/15 [place], MD 11/25/15 [phone]

## 2015-11-26 ENCOUNTER — Emergency Department (HOSPITAL_COMMUNITY)
Admission: EM | Admit: 2015-11-26 | Discharge: 2015-11-26 | Disposition: A | Discharge status: Home / Self Care | Payer: Medicare Other | Attending: Emergency Medicine | Admitting: Emergency Medicine

## 2015-11-26 DIAGNOSIS — R0602 Shortness of breath: Secondary | ICD-10-CM

## 2015-11-26 LAB — BRAIN NATRIURETIC PEPTIDE: B Natriuretic Peptide: 16.4 pg/mL (ref 0.0–100.0)

## 2015-11-26 MED ORDER — ALBUTEROL SULFATE HFA 108 (90 BASE) MCG/ACT IN AERS
2.0000 | INHALATION_SPRAY | Freq: Once | RESPIRATORY_TRACT | Status: AC
  Administered 2015-11-26: 2 via RESPIRATORY_TRACT
  Filled 2015-11-26: qty 6.7

## 2015-11-26 MED ORDER — FUROSEMIDE 10 MG/ML IJ SOLN
40.0000 mg | Freq: Once | INTRAMUSCULAR | Status: AC
  Administered 2015-11-26: 40 mg via INTRAVENOUS
  Filled 2015-11-26: qty 4

## 2015-11-26 MED ORDER — IPRATROPIUM-ALBUTEROL 0.5-2.5 (3) MG/3ML IN SOLN
3.0000 mL | Freq: Once | RESPIRATORY_TRACT | Status: AC
  Administered 2015-11-26: 3 mL via RESPIRATORY_TRACT
  Filled 2015-11-26: qty 3

## 2015-11-26 NOTE — ED Notes (Signed)
Pt ambulated in hall on pulse ox.  Saturations began at 100%.  Pt able to ambulate an entire "block" and upon returning to the room saturations were 91%.  Pt denied any SOB.  Pt also stated she had not been able to lay down at home without getting SOB.  Pt's bed laid flat, and pt sts she feels "much better" when lying down.

## 2015-11-26 NOTE — ED Provider Notes (Signed)
CSN: [id number]     Arrival date & time 11/25/15  1930 History   First MD Initiated Contact with Patient 11/26/15 0112     Chief Complaint  Patient presents with  . Shortness of Breath    The patient said she started having chest pain and sob.  The paient was seen here earlier for the same thing but she originally thought it was an allergic reaction.       (Consider location/radiation/quality/duration/timing/severity/associated sxs/prior Treatment) HPI    Jacqueline Munoz is a 55 y.o. female, with a history of MI, CHF, CAD, DM, and hypertension, presenting to the ED with difficulty breathing that began last night. Pt also complains of some hoarseness. Pt states, "It feels like I just can't take a breath in, like I can't fill my lungs completely." Pt states it feels like her CHF. Also endorses orthopnea. Pt was seen earlier today and treated as an allergic reaction, but denies improvement. 1 PPD smoker. Takes plavix. Patient denies chest pain, cough, nausea/vomiting, fever/chills, or any other complaints.      Past Medical History  Diagnosis Date  . Fibromyalgia   . History of tobacco abuse   . Hypothyroid   . OSA (obstructive sleep apnea)     a. does not wear CPAP  . History of stomach ulcers   . Arthritis   . Chronic back pain   . Chronic depression   . Kidney stones   . Essential hypertension   . HLD (hyperlipidemia)   . CAD S/P percutaneous coronary angioplasty     a. NSTEMI: s/p DES x2 LAD and DES to bifurcation of OM1 and mid to distal LCx (11/16/14) b. UA/ACS:  overlapping DES x1 to LAD for de novo 99% focal lesion in LAD (12/02/2014)  . Coronary artery chronic total occlusion: 100% RCA CTO 11/16/2014    a. Prox RCA to Mid RCA lesion, 100% stenosed.   . Type IVa MI - Peri-PCI 11/17/2014    Prolonged ischemia during bifurcation PCI of  m-d Cx-OM1 with minicrush technique  . Diabetes mellitus, type 2 ([place])   . HLD (hyperlipidemia)    Past Surgical History  Procedure Laterality  Date  . Cesarean section  2000  . Inner ear surgery Bilateral "several"  . Tonsillectomy    . Total abdominal hysterectomy  ~ 2008  . Tubal ligation  2000  . Cardiac catheterization N/A 11/16/2014    Procedure: Left Heart Cath and Coronary Angiography;  Surgeon: Peter M Martinique, MD;  Location: [place] [facility];  Service: Cardiovascular;  RCA 100% CTO, m-dLAD 90%, m-dCx 95%, OM1 90%  . Cardiac catheterization  11/16/2014    Procedure: Coronary Stent Intervention;  Surgeon: Peter M Martinique, MD;  Location: [place] [facility];  Service: Cardiovascular;;m-d LAD overlapping 3.0 x 16 & 2.75 x 16 Promus P DES; OM1-dCx Minicrush bifurcation (dCx 2.25 x 38 Synergy DES crushed with 2.75 x 24 Synergy DES in OM1)   . Cardiac catheterization N/A 12/02/2014    Procedure: Left Heart Cath and Coronary Angiography;  Surgeon: Leonie Man, MD;  Location: [place] [facility];  Service: Cardiovascular;  Previous Stents in LAD & Cx-OM patent, mLAD prox to stents 99%  . Cardiac catheterization N/A 12/02/2014    Procedure: Coronary Stent Intervention;  Surgeon: Leonie Man, MD;  Location: [facility];  Service: Cardiovascular;  3rd overlapping (prox) mLAD Promus Premier DES 3.0 x 20 (3.25 mm)  . Transthoracic echocardiogram  5/19 & 6/ 2016  a) EF 65-70%, mild LVH, Gr 1 DD; b) EF 60% -ron RWMA, no valve lesions   Family History  Problem Relation Age of Onset  . Hyperlipidemia Mother   . Sudden death Father   . Emphysema Maternal Grandmother     smoked   Social History  Substance Use Topics  . Smoking status: Former Smoker -- 1.50 packs/day for 39 years    Types: Cigarettes    Quit date: 11/10/2014  . Smokeless tobacco: Never Used  . Alcohol Use: No     Comment: 11/15/2014 "might have a few drinks/yr"   OB History    No data available     Review of Systems  Constitutional: Negative for fever, chills and diaphoresis.  Respiratory: Positive for shortness of breath.   Cardiovascular: Negative for  chest pain.  Gastrointestinal: Negative for nausea and vomiting.  Skin: Negative for color change and pallor.  Neurological: Negative for dizziness, syncope, weakness, light-headedness and headaches.  All other systems reviewed and are negative.     Allergies  Corticosteroids; Lyrica; Penicillins; and Amitriptyline  Home Medications   Prior to Admission medications   Medication Sig Start Date End Date Taking? Authorizing Provider  acyclovir (ZOVIRAX) 400 MG tablet Take 1 tablet (400 mg total) by mouth 3 (three) times daily. Patient not taking: Reported on 11/25/2015 10/17/15   Dessa Phi, MD  aspirin EC 81 MG EC tablet Take 1 tablet (81 mg total) by mouth daily. 11/18/14   Calvert Cantor, MD  atorvastatin (LIPITOR) 40 MG tablet TAKE 2 TABLETS BY MOUTH DAILY AT 6 PM. 08/25/15   Dessa Phi, MD  carvedilol (COREG) 3.125 MG tablet Take 1 tablet (3.125 mg total) by mouth 2 (two) times daily with a meal. 06/23/15   Jaclyn Shaggy, MD  clopidogrel (PLAVIX) 75 MG tablet Take 1 tablet (75 mg total) by mouth daily. 01/05/15   Josalyn Funches, MD  fluconazole (DIFLUCAN) 150 MG tablet Take 1 tablet (150 mg total) by mouth every 3 (three) days. Patient not taking: Reported on 11/25/2015 10/17/15   Dessa Phi, MD  FLUoxetine (PROZAC) 20 MG capsule Take 20 mg by mouth daily.    Historical Provider, MD  furosemide (LASIX) 40 MG tablet Take 1 tablet (40 mg total) by mouth daily. 06/15/15   Janetta Hora, PA-C  glucose monitoring kit (FREESTYLE) monitoring kit 1 each by Does not apply route 4 (four) times daily - after meals and at bedtime. 1 month Diabetic Testing Supplies for QAC-QHS accuchecks. 11/16/14   Calvert Cantor, MD  hydrOXYzine (VISTARIL) 50 MG capsule Take 50 mg by mouth 3 (three) times daily as needed. stress 11/22/15   Historical Provider, MD  Insulin Detemir (LEVEMIR) 100 UNIT/ML Pen INJECT 35 UNITS INTO THE SKIN AT BEDTIME. 06/27/15   Jaclyn Shaggy, MD  Insulin Syringe-Needle U-100  (TRUEPLUS INSULIN SYRINGE) 30G X 5/16" 0.5 ML MISC 1 each by Does not apply route 4 (four) times daily. 03/02/15   Josalyn Funches, MD  isosorbide mononitrate (IMDUR) 30 MG 24 hr tablet TAKE 1 TABLET BY MOUTH DAILY. 10/03/15   Marykay Lex, MD  levothyroxine (SYNTHROID, LEVOTHROID) 200 MCG tablet Take 1 tablet (200 mcg total) by mouth daily before breakfast. 06/15/15   Janetta Hora, PA-C  lisinopril (PRINIVIL,ZESTRIL) 10 MG tablet Take 10 mg by mouth daily.    Historical Provider, MD  metFORMIN (GLUCOPHAGE) 500 MG tablet Take 1 tablet (500 mg total) by mouth 2 (two) times daily with a meal. 03/20/15   Josalyn Funches,  MD  nitroGLYCERIN (NITROSTAT) 0.4 MG SL tablet Place 0.4 mg under the tongue every 5 (five) minutes as needed for chest pain (may take three doses by mouth per day as needed).    Historical Provider, MD   BP 120/108 mmHg  Pulse 94  Temp(Src) 98.5 F (36.9 C) (Oral)  Resp 18  SpO2 98% Physical Exam  Constitutional: She appears well-developed and well-nourished. No distress.  HENT:  Head: Normocephalic and atraumatic.  Mouth/Throat: Oropharynx is clear and moist.  Hoarseness present. Patient can speak without difficulty. Readily handles oral secretions without difficulty.  Eyes: Conjunctivae are normal. Pupils are equal, round, and reactive to light.  Neck: Normal range of motion. Neck supple. No tracheal deviation present.  Cardiovascular: Normal rate, regular rhythm, normal heart sounds and intact distal pulses.   Pulmonary/Chest: No stridor. She has decreased breath sounds in the right upper field, the right middle field, the right lower field, the left upper field, the left middle field and the left lower field. She has wheezes in the right upper field and the left upper field.  Abdominal: Soft. There is no tenderness. There is no guarding.  Musculoskeletal: She exhibits no edema or tenderness.  Lymphadenopathy:    She has no cervical adenopathy.  Neurological: She is  alert.  Skin: Skin is warm and dry. She is not diaphoretic.  Psychiatric: She has a normal mood and affect. Her behavior is normal.  Nursing note and vitals reviewed.   ED Course  Procedures (including critical care time) Labs Review Labs Reviewed  BASIC METABOLIC PANEL - Abnormal; Notable for the following:    Glucose, Bld 163 (*)    All other components within normal limits  CBC - Abnormal; Notable for the following:    WBC 11.4 (*)    All other components within normal limits  PROTIME-INR  BRAIN NATRIURETIC PEPTIDE  I-STAT TROPOININ, ED    Imaging Review Dg Chest 2 View  11/25/2015  CLINICAL DATA:  Chest pain and shortness of Breath EXAM: CHEST  2 VIEW COMPARISON:  08/21/2015 FINDINGS: The heart size and mediastinal contours are within normal limits. Both lungs are clear. The visualized skeletal structures are unremarkable. IMPRESSION: No active cardiopulmonary disease. Electronically Signed   By: Inez Catalina M.D.   On: 11/25/2015 20:36   I have personally reviewed and evaluated these images and lab results as part of my medical decision-making.   EKG Interpretation   Date/Time:  Saturday Nov 25 2015 19:41:23 EDT Ventricular Rate:  75 PR Interval:  140 QRS Duration: 78 QT Interval:  382 QTC Calculation: 426 R Axis:   34 Text Interpretation:  Normal sinus rhythm Normal ECG No acute changes No  significant change since last tracing Confirmed by Kathrynn Humble, MD, Thelma Comp  [phone]) on 11/26/2015 1:49:04 AM Also confirmed by Kathrynn Humble, MD, Thelma Comp  [phone]), editor [place] CT, Leda Gauze [phone])  on 11/26/2015 9:07:16 AM       Medications  ipratropium-albuterol (DUONEB) 0.5-2.5 (3) MG/3ML nebulizer solution 3 mL (3 mLs Nebulization Given 11/26/15 0207)  furosemide (LASIX) injection 40 mg (40 mg Intravenous Given 11/26/15 0212)  albuterol (PROVENTIL HFA;VENTOLIN HFA) 108 (90 Base) MCG/ACT inhaler 2 puff (2 puffs Inhalation Given 11/26/15 0346)     MDM   Final diagnoses:  Shortness of  breath    Cameran Schuur presents with shortness of breath and hoarseness since last night.  Findings and plan of care discussed with Varney Biles, MD. Dr. Kathrynn Humble personally evaluated and examined this patient.   Some  hoarseness is present on exam, however, it does not seem to interfere with the patient's breathing or ability to swallow. Patient is nontoxic appearing, afebrile, not tachycardic, not tachypneic, maintains SPO2 of 96-100% on room air, and is in no apparent distress. Patient has no signs of sepsis or other serious or life-threatening condition. Patient has had multiple stents placed, with the latest cardiac cath occurring June 2016 and an additional LAD stent was placed at that time. Last echo was in December 2016 showed an EF of 55-60% with some diastolic dysfunction. Patient continues to rest comfortably. Wells criteria score is 0, indicating low risk for PE. Upon reevaluation, patient voiced significant improvement following the DuoNeb. Patient ambulated without difficulty and without significant changes in pulse ox or pulse. Sent home with albuterol inhaler. Patient to follow-up with her PCP regarding her hoarseness and for reevaluation of this incident of shortness of breath. Patient states she has an upcoming appointment already scheduled. Return precautions discussed. Patient voiced understanding of these instructions and is comfortable with discharge.  Filed Vitals:   11/25/15 1937 11/26/15 0208 11/26/15 0215 11/26/15 0347  BP: 120/108 114/95 108/79 140/91  Pulse: 94 65 74 69  Temp: 98.5 F (36.9 C)     TempSrc: Oral     Resp: '18 14 18 18  '$ SpO2: 98% 100% 100% 93%     Lorayne Bender, PA-C 11/27/15 1305  Varney Biles, MD 11/28/15 0008

## 2015-11-26 NOTE — Discharge Instructions (Signed)
You have been seen today for shortness of breath. Your imaging and lab tests showed no acute abnormalities, specifically no signs of fluid overload or worsening heart failure. Follow up with PCP as soon as possible for reevaluation and chronic management. Return to ED should symptoms worsen. You should also follow-up with your pulmonologist as soon as possible.

## 2015-11-26 NOTE — ED Notes (Signed)
Pt able to ambulate independently to restroom.

## 2015-12-08 ENCOUNTER — Ambulatory Visit: Payer: Medicare Other | Attending: Family Medicine | Admitting: Family Medicine

## 2015-12-08 ENCOUNTER — Other Ambulatory Visit: Payer: Self-pay | Admitting: Family Medicine

## 2015-12-08 ENCOUNTER — Encounter: Payer: Self-pay | Admitting: Family Medicine

## 2015-12-08 VITALS — BP 129/84 | HR 97 | Temp 99.2°F | Resp 16 | Ht 62.0 in | Wt 175.0 lb

## 2015-12-08 DIAGNOSIS — E119 Type 2 diabetes mellitus without complications: Secondary | ICD-10-CM | POA: Diagnosis not present

## 2015-12-08 DIAGNOSIS — R112 Nausea with vomiting, unspecified: Secondary | ICD-10-CM | POA: Insufficient documentation

## 2015-12-08 DIAGNOSIS — Z7982 Long term (current) use of aspirin: Secondary | ICD-10-CM | POA: Diagnosis not present

## 2015-12-08 DIAGNOSIS — Z79899 Other long term (current) drug therapy: Secondary | ICD-10-CM | POA: Insufficient documentation

## 2015-12-08 DIAGNOSIS — Z7984 Long term (current) use of oral hypoglycemic drugs: Secondary | ICD-10-CM | POA: Diagnosis not present

## 2015-12-08 DIAGNOSIS — Z87891 Personal history of nicotine dependence: Secondary | ICD-10-CM | POA: Insufficient documentation

## 2015-12-08 DIAGNOSIS — Z1159 Encounter for screening for other viral diseases: Secondary | ICD-10-CM | POA: Diagnosis not present

## 2015-12-08 DIAGNOSIS — Z114 Encounter for screening for human immunodeficiency virus [HIV]: Secondary | ICD-10-CM

## 2015-12-08 DIAGNOSIS — Z794 Long term (current) use of insulin: Secondary | ICD-10-CM | POA: Insufficient documentation

## 2015-12-08 DIAGNOSIS — R1013 Epigastric pain: Secondary | ICD-10-CM | POA: Diagnosis not present

## 2015-12-08 DIAGNOSIS — Z0000 Encounter for general adult medical examination without abnormal findings: Secondary | ICD-10-CM | POA: Diagnosis not present

## 2015-12-08 LAB — HEMOCCULT GUIAC POC 1CARD (OFFICE): Fecal Occult Blood, POC: NEGATIVE

## 2015-12-08 LAB — GLUCOSE, POCT (MANUAL RESULT ENTRY): POC Glucose: 264 mg/dl — AB (ref 70–99)

## 2015-12-08 MED ORDER — ONDANSETRON 8 MG PO TBDP: 8.0000 mg | ORAL_TABLET | Freq: Three times a day (TID) | ORAL | Status: AC | PRN

## 2015-12-08 MED ORDER — SUCRALFATE 1 GM/10ML PO SUSP
1.0000 g | Freq: Three times a day (TID) | ORAL | Status: DC
Start: 2015-12-08 — End: 2016-02-15

## 2015-12-08 MED ORDER — RANITIDINE HCL 150 MG PO TABS: 150.0000 mg | ORAL_TABLET | Freq: Two times a day (BID) | ORAL | Status: DC

## 2015-12-08 MED FILL — raNITIdine HCL 150 MG TABS: 150 | 15 days supply | Qty: 30 | Fill #0

## 2015-12-08 MED FILL — CARAFATE 1 GM/10 ML SUSP: 1 | 10 days supply | Qty: 420 | Fill #0

## 2015-12-08 MED FILL — ONDANSETRON ODT 8 MG TABLET: 8 | 6 days supply | Qty: 20 | Fill #0

## 2015-12-08 NOTE — Patient Instructions (Addendum)
Jacqueline Munoz was seen today for nausea, emesis and diarrhea.  Diagnoses and all orders for this visit:  Type 2 diabetes mellitus without complication, unspecified long term insulin use status (HCC) -     POCT glucose (manual entry)  Abdominal pain, epigastric -     US Abdomen Complete; Future -     Hepatic Function Panel -     ranitidine (ZANTAC) 150 MG tablet; Take 1 tablet (150 mg total) by mouth 2 (two) times daily. -     sucralfate (CARAFATE) 1 GM/10ML suspension; Take 10 mLs (1 g total) by mouth 4 (four) times daily -  with meals and at bedtime. -     ondansetron (ZOFRAN ODT) 8 MG disintegrating tablet; Take 1 tablet (8 mg total) by mouth every 8 (eight) hours as needed for nausea or vomiting.  Healthcare maintenance -     Ambulatory referral to Gastroenterology -     Hemoccult - 1 Card (office)  Screening for HIV (human immunodeficiency virus) -     HIV antibody (with reflex)  Need for hepatitis C screening test -     Hepatitis C antibody, reflex   Use lomotil or imodium as needed for nausea  No opioids at this times as it is counterproductive in abdominal pain You can use tylenol   You will be called with lab results and with GI appointment  F/u in 4 weeks for abdominal pain and weight check   Dr. Adrian Blackwater

## 2015-12-08 NOTE — Progress Notes (Signed)
Subjective:  Patient ID: Jacqueline Munoz, female    DOB: Mar 05, 1961  Age: 55 y.o. MRN: [id number]  CC: Nausea; Emesis; and Diarrhea   HPI Jacqueline Munoz has diabetes, hypothyroidism, CAD  she presents for    1. Nausea, vomiting and diarrhea: x 2 weeks. With epigastric pains. She denies ETOH. Pain is not associated with eating. No fever or chills, but she does get intermittent hot flashes. She is a smoker. No blood in stool. She has hx of hemorrhoids. She has a strong family history of gallstones. She request opiod pain medicine.   Social History  Substance Use Topics  . Smoking status: Former Smoker -- 1.50 packs/day for 39 years    Types: Cigarettes    Quit date: 11/10/2014  . Smokeless tobacco: Never Used  . Alcohol Use: No     Comment: 11/15/2014 "might have a few drinks/yr"    Outpatient Prescriptions Prior to Visit  Medication Sig Dispense Refill  . acyclovir (ZOVIRAX) 400 MG tablet Take 1 tablet (400 mg total) by mouth 3 (three) times daily. 15 tablet 1  . aspirin EC 81 MG EC tablet Take 1 tablet (81 mg total) by mouth daily. 30 tablet 0  . atorvastatin (LIPITOR) 40 MG tablet TAKE 2 TABLETS BY MOUTH DAILY AT 6 PM. 60 tablet 2  . carvedilol (COREG) 3.125 MG tablet Take 1 tablet (3.125 mg total) by mouth 2 (two) times daily with a meal. 60 tablet 0  . clopidogrel (PLAVIX) 75 MG tablet Take 1 tablet (75 mg total) by mouth daily. 30 tablet 2  . FLUoxetine (PROZAC) 20 MG capsule Take 40 mg by mouth daily.     . furosemide (LASIX) 40 MG tablet Take 1 tablet (40 mg total) by mouth daily. 30 tablet 6  . glucose monitoring kit (FREESTYLE) monitoring kit 1 each by Does not apply route 4 (four) times daily - after meals and at bedtime. 1 month Diabetic Testing Supplies for QAC-QHS accuchecks. 1 each 1  . hydrOXYzine (VISTARIL) 50 MG capsule Take 50 mg by mouth 3 (three) times daily as needed. stress    . Insulin Detemir (LEVEMIR) 100 UNIT/ML Pen INJECT 35 UNITS INTO THE SKIN AT BEDTIME. 15  mL 1  . Insulin Syringe-Needle U-100 (TRUEPLUS INSULIN SYRINGE) 30G X 5/16" 0.5 ML MISC 1 each by Does not apply route 4 (four) times daily. 100 each 11  . isosorbide mononitrate (IMDUR) 30 MG 24 hr tablet TAKE 1 TABLET BY MOUTH DAILY. 30 tablet 10  . levothyroxine (SYNTHROID, LEVOTHROID) 200 MCG tablet Take 1 tablet (200 mcg total) by mouth daily before breakfast. 30 tablet 6  . lisinopril (PRINIVIL,ZESTRIL) 10 MG tablet Take 10 mg by mouth daily.    . metFORMIN (GLUCOPHAGE) 500 MG tablet Take 1 tablet (500 mg total) by mouth 2 (two) times daily with a meal. 60 tablet 5  . nitroGLYCERIN (NITROSTAT) 0.4 MG SL tablet Place 0.4 mg under the tongue every 5 (five) minutes as needed for chest pain (may take three doses by mouth per day as needed).    . fluconazole (DIFLUCAN) 150 MG tablet Take 1 tablet (150 mg total) by mouth every 3 (three) days. (Patient not taking: Reported on 11/25/2015) 2 tablet 5   No facility-administered medications prior to visit.    ROS Review of Systems  Constitutional: Negative for fever and chills.  Eyes: Negative for visual disturbance.  Respiratory: Negative for shortness of breath.   Cardiovascular: Negative for chest pain.  Gastrointestinal: Positive for abdominal  pain (epigastric ). Negative for blood in stool.  Musculoskeletal: Positive for myalgias. Negative for back pain and arthralgias.  Skin: Negative for rash.  Allergic/Immunologic: Negative for immunocompromised state.  Hematological: Negative for adenopathy. Does not bruise/bleed easily.  Psychiatric/Behavioral: Negative for suicidal ideas and dysphoric mood.    Objective:  BP 129/84 mmHg  Pulse 97  Temp(Src) 99.2 F (37.3 C) (Oral)  Resp 16  Ht '5\' 2"'$  (1.575 m)  Wt 175 lb (79.379 kg)  BMI 32.00 kg/m2  SpO2 98%  BP/Weight 12/08/2015 11/26/2015 11/26/4130  Systolic BP 440 102 725  Diastolic BP 84 91 99  Wt. (Lbs) 175 - 176.19  BMI 32 - 32.22   Wt Readings from Last 3 Encounters:  12/08/15 175  lb (79.379 kg)  11/25/15 176 lb 3 oz (79.918 kg)  10/17/15 184 lb (83.462 kg)     Physical Exam  Constitutional: She is oriented to person, place, and time. She appears well-developed and well-nourished. No distress.  HENT:  Head: Normocephalic and atraumatic.  Cardiovascular: Normal rate, regular rhythm, normal heart sounds and intact distal pulses.   Pulmonary/Chest: Effort normal and breath sounds normal.  Abdominal: Soft. Bowel sounds are normal. She exhibits no distension and no mass. There is tenderness in the epigastric area. There is no rebound and no guarding.  Genitourinary: Rectal exam shows external hemorrhoid and anal tone abnormal. Rectal exam shows no internal hemorrhoid, no fissure, no mass and no tenderness. Guaiac negative stool.  Musculoskeletal: She exhibits no edema.  Neurological: She is alert and oriented to person, place, and time.  Skin: Skin is warm and dry. No rash noted.  Psychiatric: She has a normal mood and affect.   Lab Results  Component Value Date   HGBA1C 7.4 10/17/2015   Assessment & Plan:   Jacqueline Munoz was seen today for nausea, emesis and diarrhea.  Diagnoses and all orders for this visit:  Type 2 diabetes mellitus without complication, unspecified long term insulin use status (HCC) -     POCT glucose (manual entry)  Abdominal pain, epigastric -     US Abdomen Complete; Future -     Hepatic Function Panel -     ranitidine (ZANTAC) 150 MG tablet; Take 1 tablet (150 mg total) by mouth 2 (two) times daily. -     sucralfate (CARAFATE) 1 GM/10ML suspension; Take 10 mLs (1 g total) by mouth 4 (four) times daily -  with meals and at bedtime. -     ondansetron (ZOFRAN ODT) 8 MG disintegrating tablet; Take 1 tablet (8 mg total) by mouth every 8 (eight) hours as needed for nausea or vomiting.  Healthcare maintenance -     Ambulatory referral to Gastroenterology -     Hemoccult - 1 Card (office)  Screening for HIV (human immunodeficiency virus) -      HIV antibody (with reflex)  Need for hepatitis C screening test -     Hepatitis C antibody, reflex   Meds ordered this encounter  Medications  . ranitidine (ZANTAC) 150 MG tablet    Sig: Take 1 tablet (150 mg total) by mouth 2 (two) times daily.    Dispense:  30 tablet    Refill:  2  . sucralfate (CARAFATE) 1 GM/10ML suspension    Sig: Take 10 mLs (1 g total) by mouth 4 (four) times daily -  with meals and at bedtime.    Dispense:  420 mL    Refill:  0  . ondansetron (ZOFRAN ODT) 8 MG  disintegrating tablet    Sig: Take 1 tablet (8 mg total) by mouth every 8 (eight) hours as needed for nausea or vomiting.    Dispense:  20 tablet    Refill:  0    Follow-up: No Follow-up on file.   Boykin Nearing MD

## 2015-12-08 NOTE — Progress Notes (Signed)
C/C NVD x 2 weeks  Tobacco user 1 ppday  No suicidal thoughts in the past two weeks

## 2015-12-09 LAB — HEPATITIS C ANTIBODY: HCV AB: NEGATIVE

## 2015-12-09 LAB — HEPATIC FUNCTION PANEL
ALBUMIN: 4.4 g/dL (ref 3.6–5.1)
ALK PHOS: 50 U/L (ref 33–130)
ALT: 12 U/L (ref 6–29)
AST: 9 U/L — ABNORMAL LOW (ref 10–35)
BILIRUBIN INDIRECT: 0.2 mg/dL (ref 0.2–1.2)
BILIRUBIN TOTAL: 0.3 mg/dL (ref 0.2–1.2)
Bilirubin, Direct: 0.1 mg/dL (ref <=0.2)
Total Protein: 6.6 g/dL (ref 6.1–8.1)

## 2015-12-09 LAB — HIV ANTIBODY (ROUTINE TESTING W REFLEX): HIV 1&2 Ab, 4th Generation: NONREACTIVE

## 2015-12-10 NOTE — Assessment & Plan Note (Addendum)
Epigastric pain: gastritis vs biliary colic Plan: Zantac and carafate for pain zofran for nausea Abdominal ultrasound No opiod pain medicine as it can exacerbate GI pain   GI referral as patient is overdue for screening colonoscopy

## 2015-12-15 ENCOUNTER — Telehealth (HOSPITAL_COMMUNITY): Payer: Self-pay

## 2015-12-15 NOTE — Telephone Encounter (Signed)
Called to remind pt of 11:30am appt at [facility] on 12/18/15. No answer, left vm. AW

## 2015-12-18 ENCOUNTER — Ambulatory Visit (HOSPITAL_COMMUNITY)
Admission: RE | Admit: 2015-12-18 | Discharge: 2015-12-18 | Disposition: A | Discharge status: Home / Self Care | Payer: Medicare Other | Source: Ambulatory Visit | Attending: Family Medicine | Admitting: Family Medicine

## 2015-12-18 DIAGNOSIS — R1013 Epigastric pain: Secondary | ICD-10-CM | POA: Diagnosis not present

## 2015-12-19 ENCOUNTER — Telehealth: Payer: Self-pay | Admitting: *Deleted

## 2015-12-19 MED FILL — FLUCONAZOLE 150 MG TABLET: 150 | 5 days supply | Qty: 2 | Fill #1

## 2015-12-19 MED FILL — LEVEMIR 100 UNITS/ML VIAL: 100 | 28 days supply | Qty: 10 | Fill #1

## 2015-12-19 MED FILL — LEVOTHYROXINE 200 MCG TAB: 200 | 30 days supply | Qty: 30 | Fill #5

## 2015-12-19 MED FILL — CARVEDILOL 3.125 MG TABLET: 3.125 | 30 days supply | Qty: 60 | Fill #2

## 2015-12-19 MED FILL — ATORVASTATIN 40 MG TABLET: 40 | 30 days supply | Qty: 60 | Fill #3

## 2015-12-19 MED FILL — ISOSORBIDE MN ER 30 MG TAB: 30 | 30 days supply | Qty: 30 | Fill #1

## 2015-12-19 NOTE — Telephone Encounter (Signed)
-----   Message from Boykin Nearing, MD sent at 12/09/2015 12:59 PM EDT ----- Screening hep C negative

## 2015-12-19 NOTE — Telephone Encounter (Signed)
-----   Message from Boykin Nearing, MD sent at 12/09/2015 12:34 PM EDT ----- Labs normal  Liver function test normal

## 2015-12-19 NOTE — Telephone Encounter (Signed)
LVM to return call.

## 2015-12-25 NOTE — Telephone Encounter (Signed)
Called pt. Pt verified name and date of birth. Pt notified that her labs including her liver function test were normal. Pt asked if her thyroid levels were tested. Pt notified that they were not. Pt requests thyroid check because she has been experiencing sweating a lot. Pt then asked about her ultrasound results. Pt notified her that her ultrasound was normal. Pt voiced understanding and transferred to the front desk to make appt to get thyroid levels checked.

## 2015-12-25 NOTE — Telephone Encounter (Signed)
Pt. Returned call. Please f/u with pt. °

## 2016-01-11 ENCOUNTER — Ambulatory Visit: Payer: Medicare Other | Admitting: Family Medicine

## 2016-02-09 ENCOUNTER — Emergency Department (HOSPITAL_COMMUNITY)
Admission: EM | Admit: 2016-02-09 | Discharge: 2016-02-09 | Disposition: A | Discharge status: Home / Self Care | Payer: Medicare Other | Attending: Emergency Medicine | Admitting: Emergency Medicine

## 2016-02-09 ENCOUNTER — Other Ambulatory Visit: Payer: Self-pay | Admitting: Family Medicine

## 2016-02-09 ENCOUNTER — Encounter (HOSPITAL_COMMUNITY): Payer: Self-pay | Admitting: Emergency Medicine

## 2016-02-09 DIAGNOSIS — I252 Old myocardial infarction: Secondary | ICD-10-CM | POA: Diagnosis not present

## 2016-02-09 DIAGNOSIS — E876 Hypokalemia: Secondary | ICD-10-CM | POA: Diagnosis not present

## 2016-02-09 DIAGNOSIS — I110 Hypertensive heart disease with heart failure: Secondary | ICD-10-CM | POA: Insufficient documentation

## 2016-02-09 DIAGNOSIS — E1165 Type 2 diabetes mellitus with hyperglycemia: Secondary | ICD-10-CM | POA: Diagnosis not present

## 2016-02-09 DIAGNOSIS — Z7982 Long term (current) use of aspirin: Secondary | ICD-10-CM | POA: Diagnosis not present

## 2016-02-09 DIAGNOSIS — I2510 Atherosclerotic heart disease of native coronary artery without angina pectoris: Secondary | ICD-10-CM | POA: Diagnosis not present

## 2016-02-09 DIAGNOSIS — E1151 Type 2 diabetes mellitus with diabetic peripheral angiopathy without gangrene: Secondary | ICD-10-CM

## 2016-02-09 DIAGNOSIS — I5032 Chronic diastolic (congestive) heart failure: Secondary | ICD-10-CM | POA: Insufficient documentation

## 2016-02-09 DIAGNOSIS — E039 Hypothyroidism, unspecified: Secondary | ICD-10-CM | POA: Diagnosis not present

## 2016-02-09 DIAGNOSIS — R739 Hyperglycemia, unspecified: Secondary | ICD-10-CM

## 2016-02-09 DIAGNOSIS — Z955 Presence of coronary angioplasty implant and graft: Secondary | ICD-10-CM | POA: Insufficient documentation

## 2016-02-09 DIAGNOSIS — Z87891 Personal history of nicotine dependence: Secondary | ICD-10-CM | POA: Insufficient documentation

## 2016-02-09 DIAGNOSIS — Z794 Long term (current) use of insulin: Secondary | ICD-10-CM | POA: Insufficient documentation

## 2016-02-09 DIAGNOSIS — Z7984 Long term (current) use of oral hypoglycemic drugs: Secondary | ICD-10-CM | POA: Insufficient documentation

## 2016-02-09 LAB — URINALYSIS, ROUTINE W REFLEX MICROSCOPIC
BILIRUBIN URINE: NEGATIVE
Glucose, UA: >1000 mg/dL — AB
HGB URINE DIPSTICK: NEGATIVE
Ketones, ur: NEGATIVE mg/dL
Leukocytes, UA: NEGATIVE
Nitrite: NEGATIVE
PH: 6 (ref 5.0–8.0)
Protein, ur: NEGATIVE mg/dL
SPECIFIC GRAVITY, URINE: 1.025 (ref 1.005–1.030)

## 2016-02-09 LAB — CBC WITH DIFFERENTIAL/PLATELET
BASOS ABS: 0.1 10*3/uL (ref 0.0–0.1)
Basophils Relative: 1 %
EOS ABS: 0.3 10*3/uL (ref 0.0–0.7)
EOS PCT: 3 %
HCT: 39 % (ref 36.0–46.0)
Hemoglobin: 12.8 g/dL (ref 12.0–15.0)
LYMPHS PCT: 38 %
Lymphs Abs: 3.7 10*3/uL (ref 0.7–4.0)
MCH: 29.1 pg (ref 26.0–34.0)
MCHC: 32.8 g/dL (ref 30.0–36.0)
MCV: 88.6 fL (ref 78.0–100.0)
MONO ABS: 0.4 10*3/uL (ref 0.1–1.0)
Monocytes Relative: 4 %
Neutro Abs: 5.3 10*3/uL (ref 1.7–7.7)
Neutrophils Relative %: 54 %
PLATELETS: 235 10*3/uL (ref 150–400)
RBC: 4.4 MIL/uL (ref 3.87–5.11)
RDW: 13 % (ref 11.5–15.5)
WBC: 9.8 10*3/uL (ref 4.0–10.5)

## 2016-02-09 LAB — COMPREHENSIVE METABOLIC PANEL
ALT: 10 U/L — AB (ref 14–54)
AST: 14 U/L — AB (ref 15–41)
Albumin: 3.7 g/dL (ref 3.5–5.0)
Alkaline Phosphatase: 62 U/L (ref 38–126)
Anion gap: 8 (ref 5–15)
BUN: 8 mg/dL (ref 6–20)
CHLORIDE: 100 mmol/L — AB (ref 101–111)
CO2: 27 mmol/L (ref 22–32)
CREATININE: 0.84 mg/dL (ref 0.44–1.00)
Calcium: 8.9 mg/dL (ref 8.9–10.3)
Glucose, Bld: 353 mg/dL — ABNORMAL HIGH (ref 65–99)
POTASSIUM: 3 mmol/L — AB (ref 3.5–5.1)
SODIUM: 135 mmol/L (ref 135–145)
Total Bilirubin: 0.4 mg/dL (ref 0.3–1.2)
Total Protein: 5.8 g/dL — ABNORMAL LOW (ref 6.5–8.1)

## 2016-02-09 LAB — URINE MICROSCOPIC-ADD ON

## 2016-02-09 LAB — CBG MONITORING, ED
Glucose-Capillary: 337 mg/dL — ABNORMAL HIGH (ref 65–99)
Glucose-Capillary: 356 mg/dL — ABNORMAL HIGH (ref 65–99)

## 2016-02-09 MED ORDER — OXYCODONE-ACETAMINOPHEN 5-325 MG PO TABS: 1.0000 | ORAL_TABLET | Freq: Once | ORAL | Status: DC

## 2016-02-09 MED ORDER — SODIUM CHLORIDE 0.9 % IV BOLUS (SEPSIS)
1000.0000 mL | Freq: Once | INTRAVENOUS | Status: AC
  Administered 2016-02-09: 1000 mL via INTRAVENOUS

## 2016-02-09 MED ORDER — POTASSIUM CHLORIDE ER 20 MEQ PO TBCR: 40.0000 meq | EXTENDED_RELEASE_TABLET | Freq: Two times a day (BID) | ORAL | 0 refills | Status: DC

## 2016-02-09 MED ORDER — POTASSIUM CHLORIDE CRYS ER 20 MEQ PO TBCR
40.0000 meq | EXTENDED_RELEASE_TABLET | Freq: Once | ORAL | Status: AC
  Administered 2016-02-09: 40 meq via ORAL
  Filled 2016-02-09: qty 2

## 2016-02-09 MED ORDER — OXYCODONE-ACETAMINOPHEN 5-325 MG PO TABS
1.0000 | ORAL_TABLET | Freq: Once | ORAL | Status: AC
  Administered 2016-02-09: 1 via ORAL
  Filled 2016-02-09: qty 1

## 2016-02-09 MED FILL — LISINOPRIL 10 MG TABLET: 10 | 30 days supply | Qty: 30 | Fill #4

## 2016-02-09 MED FILL — FLUCONAZOLE 150 MG TABLET: 150 | 5 days supply | Qty: 2 | Fill #2

## 2016-02-09 MED FILL — metFORMIN HCL 500 MG TABS: 500 | 30 days supply | Qty: 60 | Fill #0

## 2016-02-09 MED FILL — CARVEDILOL 3.125 MG TABLET: 3.125 | 30 days supply | Qty: 60 | Fill #0

## 2016-02-09 MED FILL — LEVOTHYROXINE 200 MCG TAB: 200 | 30 days supply | Qty: 30 | Fill #6

## 2016-02-09 MED FILL — ISOSORBIDE MN ER 30 MG TAB: 30 | 30 days supply | Qty: 30 | Fill #2

## 2016-02-09 MED FILL — ATORVASTATIN 40 MG TABLET: 40 | 30 days supply | Qty: 60 | Fill #0

## 2016-02-09 NOTE — ED Provider Notes (Signed)
[facility] DEPT Provider Note   CSN: [id number] Arrival date & time: 02/09/16  0208  First Provider Contact:  First MD Initiated Contact with Patient 02/09/16 0458        History   Chief Complaint Chief Complaint  Patient presents with  . Hyperglycemia    HPI Jacqueline Munoz is a 55 y.o. female.  Patient presents with complaint of high blood sugar for the past 1-2 days. She also complains of a headache like her typical common headache and "restlessness" in there lower extremities like symptoms she has experienced as diabetic neuropathy. No fever, nausea, vomiting, SOB or chest pain.   The history is provided by the patient. No language interpreter was used.  Hyperglycemia  Associated symptoms: no fever, no nausea, no vomiting and no weakness     Past Medical History:  Diagnosis Date  . Arthritis   . CAD S/P percutaneous coronary angioplasty    a. NSTEMI: s/p DES x2 LAD and DES to bifurcation of OM1 and mid to distal LCx (11/16/14) b. UA/ACS:  overlapping DES x1 to LAD for de novo 99% focal lesion in LAD (12/02/2014)  . Chronic back pain   . Chronic depression   . Coronary artery chronic total occlusion: 100% RCA CTO 11/16/2014   a. Prox RCA to Mid RCA lesion, 100% stenosed.   . Diabetes mellitus, type 2 ([place])   . Essential hypertension   . Fibromyalgia   . History of stomach ulcers   . History of tobacco abuse   . HLD (hyperlipidemia)   . HLD (hyperlipidemia)   . Hypothyroid   . Kidney stones   . OSA (obstructive sleep apnea)    a. does not wear CPAP  . Type IVa MI - Peri-PCI 11/17/2014   Prolonged ischemia during bifurcation PCI of  m-d Cx-OM1 with minicrush technique    Patient Active Problem List   Diagnosis Date Noted  . Abdominal pain, epigastric 12/08/2015  . Subcutaneous nodules 10/17/2015  . Herpes zoster 10/17/2015  . History of Non-ST elevated myocardial infarction (non-STEMI) ([place]) 08/27/2015  . Painful lumpy left breast 08/21/2015  . Vaginal polyp  08/21/2015  . Vaginal bleeding 08/21/2015  . Costochondritis 07/20/2015  . Demand ischemia ([place]) 06/15/2015  . Fibromyalgia   . Diabetes mellitus, type 2 ([place])   . CAD S/P percutaneous coronary angioplasty   . HLD (hyperlipidemia)   . Chronic diastolic heart failure ([place])   . Cephalalgia   . Mixed hyperlipidemia   . Upper airway cough syndrome 03/25/2015  . Post herpetic neuralgia T6/8 on L  03/25/2015  . Obesity 03/25/2015  . Incidental lung nodule, > 23m and < 827m09/19/2016  . Leg swelling 03/20/2015  . Fatigue 02/02/2015  . Lumbar back pain 01/20/2015  . Vitamin D deficiency 01/06/2015  . Intercostal muscle pain 01/05/2015  . Chronic pain syndrome 01/05/2015  . Vertigo, constant 12/22/2014  . Voice hoarseness 12/14/2014  . Temporomandibular joint disorders 12/07/2014  . Anxiety and depression 11/23/2014  . Knee pain, right 11/23/2014  . Abdominal pain, left upper quadrant 11/23/2014  . Former very heavy cigarette smoker (more than 40 per day) 11/18/2014  . Essential hypertension 11/15/2014  . Hypothyroid 11/15/2014    Past Surgical History:  Procedure Laterality Date  . CARDIAC CATHETERIZATION N/A 11/16/2014   Procedure: Left Heart Cath and Coronary Angiography;  Surgeon: Peter M JoMartiniqueMD;  [facility];  Service: Cardiovascular;  RCA 100% CTO, m-dLAD 90%, m-dCx 95%, OM1 90%  . CARDIAC CATHETERIZATION  11/16/2014   Procedure: Coronary Stent Intervention;  Surgeon: Peter M Martinique, MD;  Location: [facility];  Service: Cardiovascular;;m-d LAD overlapping 3.0 x 16 & 2.75 x 16 Promus P DES; OM1-dCx Minicrush bifurcation (dCx 2.25 x 38 Synergy DES crushed with 2.75 x 24 Synergy DES in OM1)   . CARDIAC CATHETERIZATION N/A 12/02/2014   Procedure: Left Heart Cath and Coronary Angiography;  Surgeon: Leonie Man, MD;  Location: [facility];  Service: Cardiovascular;  Previous Stents in LAD & Cx-OM patent, mLAD prox to stents 99%  . CARDIAC  CATHETERIZATION N/A 12/02/2014   Procedure: Coronary Stent Intervention;  Surgeon: Leonie Man, MD;  Location: [facility];  Service: Cardiovascular;  3rd overlapping (prox) mLAD Promus Premier DES 3.0 x 20 (3.25 mm)  . CESAREAN SECTION  2000  . INNER EAR SURGERY Bilateral "several"  . TONSILLECTOMY    . TOTAL ABDOMINAL HYSTERECTOMY  ~ 2008  . TRANSTHORACIC ECHOCARDIOGRAM  5/19 & 6/ 2016   a) EF 65-70%, mild LVH, Gr 1 DD; b) EF 60% -ron RWMA, no valve lesions  . TUBAL LIGATION  2000    OB History    No data available       Home Medications    Prior to Admission medications   Medication Sig Start Date End Date Taking? Authorizing Provider  aspirin EC 81 MG EC tablet Take 1 tablet (81 mg total) by mouth daily. 11/18/14  Yes Debbe Odea, MD  atorvastatin (LIPITOR) 40 MG tablet TAKE 2 TABLETS BY MOUTH DAILY AT 6 PM. 08/25/15  Yes Josalyn Funches, MD  carvedilol (COREG) 3.125 MG tablet Take 1 tablet (3.125 mg total) by mouth 2 (two) times daily with a meal. 06/23/15  Yes Arnoldo Morale, MD  clopidogrel (PLAVIX) 75 MG tablet Take 1 tablet (75 mg total) by mouth daily. 01/05/15  Yes Josalyn Funches, MD  FLUoxetine (PROZAC) 20 MG capsule Take 40 mg by mouth daily.    Yes Historical Provider, MD  furosemide (LASIX) 40 MG tablet Take 1 tablet (40 mg total) by mouth daily. 06/15/15  Yes Eileen Stanford, PA-C  Insulin Detemir (LEVEMIR) 100 UNIT/ML Pen INJECT 35 UNITS INTO THE SKIN AT BEDTIME. 06/27/15  Yes Arnoldo Morale, MD  isosorbide mononitrate (IMDUR) 30 MG 24 hr tablet TAKE 1 TABLET BY MOUTH DAILY. 10/03/15  Yes Leonie Man, MD  levothyroxine (SYNTHROID, LEVOTHROID) 200 MCG tablet Take 1 tablet (200 mcg total) by mouth daily before breakfast. 06/15/15  Yes Eileen Stanford, PA-C  lisinopril (PRINIVIL,ZESTRIL) 10 MG tablet Take 10 mg by mouth daily.   Yes Historical Provider, MD  LORazepam (ATIVAN) 1 MG tablet Take 1 mg by mouth daily.   Yes Historical Provider, MD  metFORMIN  (GLUCOPHAGE) 500 MG tablet Take 1 tablet (500 mg total) by mouth 2 (two) times daily with a meal. 03/20/15  Yes Josalyn Funches, MD  nitroGLYCERIN (NITROSTAT) 0.4 MG SL tablet Place 0.4 mg under the tongue every 5 (five) minutes as needed for chest pain (may take three doses by mouth per day as needed).   Yes Historical Provider, MD  ranitidine (ZANTAC) 150 MG tablet Take 1 tablet (150 mg total) by mouth 2 (two) times daily. 12/08/15  Yes Josalyn Funches, MD  acyclovir (ZOVIRAX) 400 MG tablet Take 1 tablet (400 mg total) by mouth 3 (three) times daily. Patient not taking: Reported on 02/09/2016 10/17/15   Boykin Nearing, MD  glucose monitoring kit (FREESTYLE) monitoring kit 1 each by Does  not apply route 4 (four) times daily - after meals and at bedtime. 1 month Diabetic Testing Supplies for QAC-QHS accuchecks. 11/16/14   Debbe Odea, MD  Insulin Syringe-Needle U-100 (TRUEPLUS INSULIN SYRINGE) 30G X 5/16" 0.5 ML MISC 1 each by Does not apply route 4 (four) times daily. 03/02/15   Josalyn Funches, MD  ondansetron (ZOFRAN ODT) 8 MG disintegrating tablet Take 1 tablet (8 mg total) by mouth every 8 (eight) hours as needed for nausea or vomiting. Patient not taking: Reported on 02/09/2016 12/08/15   Boykin Nearing, MD  sucralfate (CARAFATE) 1 GM/10ML suspension Take 10 mLs (1 g total) by mouth 4 (four) times daily -  with meals and at bedtime. Patient not taking: Reported on 02/09/2016 12/08/15   Boykin Nearing, MD    Family History Family History  Problem Relation Age of Onset  . Hyperlipidemia Mother   . Sudden death Father   . Emphysema Maternal Grandmother     smoked    Social History Social History  Substance Use Topics  . Smoking status: Former Smoker    Packs/day: 1.50    Years: 39.00    Types: Cigarettes    Quit date: 11/10/2014  . Smokeless tobacco: Never Used  . Alcohol use No     Comment: 11/15/2014 "might have a few drinks/yr"     Allergies   Corticosteroids; Lyrica [pregabalin];  Penicillins; and Amitriptyline   Review of Systems Review of Systems  Constitutional: Negative for chills and fever.  Eyes: Negative for photophobia and visual disturbance.  Respiratory: Negative.   Cardiovascular: Negative.   Gastrointestinal: Negative.  Negative for nausea and vomiting.  Musculoskeletal: Negative.        See HPI.  Skin: Negative.   Neurological: Positive for headaches. Negative for weakness and numbness.     Physical Exam Updated Vital Signs BP (!) 130/53   Pulse (!) 52   Temp 98.7 F (37.1 C) (Oral)   Resp 16   Ht '5\' 2"'$  (1.575 m)   Wt 82.6 kg   SpO2 95%   BMI 33.29 kg/m   Physical Exam  Constitutional: She appears well-developed and well-nourished.  HENT:  Head: Normocephalic.  Neck: Normal range of motion. Neck supple.  Cardiovascular: Normal rate and regular rhythm.   Pulmonary/Chest: Effort normal and breath sounds normal. She has no wheezes. She has no rales.  Abdominal: Soft. Bowel sounds are normal. There is no tenderness. There is no rebound and no guarding.  Musculoskeletal: Normal range of motion.  Neurological: She is alert. No cranial nerve deficit. She exhibits normal muscle tone. Coordination normal.  Skin: Skin is warm and dry. No rash noted.  Psychiatric: She has a normal mood and affect.     ED Treatments / Results  Labs (all labs ordered are listed, but only abnormal results are displayed) Labs Reviewed  COMPREHENSIVE METABOLIC PANEL - Abnormal; Notable for the following:       Result Value   Potassium 3.0 (*)    Chloride 100 (*)    Glucose, Bld 353 (*)    Total Protein 5.8 (*)    AST 14 (*)    ALT 10 (*)    All other components within normal limits  URINALYSIS, ROUTINE W REFLEX MICROSCOPIC (NOT AT [facility]) - Abnormal; Notable for the following:    Glucose, UA >1000 (*)    All other components within normal limits  URINE MICROSCOPIC-ADD ON - Abnormal; Notable for the following:    Squamous Epithelial / LPF 0-5 (*)  Bacteria, UA RARE (*)    All other components within normal limits  CBG MONITORING, ED - Abnormal; Notable for the following:    Glucose-Capillary 337 (*)    All other components within normal limits  CBG MONITORING, ED - Abnormal; Notable for the following:    Glucose-Capillary 356 (*)    All other components within normal limits  CBC WITH DIFFERENTIAL/PLATELET    EKG  EKG Interpretation None       Radiology No results found.  Procedures Procedures (including critical care time)  Medications Ordered in ED Medications  oxyCODONE-acetaminophen (PERCOCET/ROXICET) 5-325 MG per tablet 1 tablet (not administered)  sodium chloride 0.9 % bolus 1,000 mL (1,000 mLs Intravenous New Bag/Given 02/09/16 0603)  potassium chloride SA (K-DUR,KLOR-CON) CR tablet 40 mEq (40 mEq Oral Given 02/09/16 0555)  oxyCODONE-acetaminophen (PERCOCET/ROXICET) 5-325 MG per tablet 1 tablet (1 tablet Oral Given 02/09/16 8676)     Initial Impression / Assessment and Plan / ED Course  I have reviewed the triage vital signs and the nursing notes.  Pertinent labs & imaging results that were available during my care of the patient were reviewed by me and considered in my medical decision making (see chart for details).  Clinical Course    Patient with mildly high blood sugar without acidosis. She is given a percocet for symptoms and is feeling improved. IV fluids provided.   She has hyperglycemia without symptoms or lab evidence of DKA. Potassium is low and will supplemented. Discussed with Dr. Billy Fischer. She has oral and long-acting insulin regimens along with a sliding scale novolog regimen. She is felt stable for discharge home.   Final Clinical Impressions(s) / ED Diagnoses   Final diagnoses:  None  1. Hyperglycemia 2. hypokalemia  New Prescriptions New Prescriptions   No medications on file     Charlann Lange, PA-C 02/09/16 1950    Gareth Morgan, MD 02/09/16 [phone]

## 2016-02-09 NOTE — ED Triage Notes (Signed)
Pt. reports elevated blood sugar( 409 ) at home this evening , she took Novolog 15 units this evening , denies SOB , no pain or discomfort .

## 2016-02-15 ENCOUNTER — Emergency Department (HOSPITAL_COMMUNITY): Payer: Medicare Other

## 2016-02-15 ENCOUNTER — Observation Stay (HOSPITAL_COMMUNITY)
Admission: EM | Admit: 2016-02-15 | Discharge: 2016-02-17 | Disposition: A | Discharge status: Home / Self Care | Payer: Medicare Other | Attending: Family Medicine | Admitting: Family Medicine

## 2016-02-15 ENCOUNTER — Encounter (HOSPITAL_COMMUNITY): Payer: Self-pay

## 2016-02-15 DIAGNOSIS — F32A Depression, unspecified: Secondary | ICD-10-CM | POA: Diagnosis present

## 2016-02-15 DIAGNOSIS — G44209 Tension-type headache, unspecified, not intractable: Secondary | ICD-10-CM | POA: Diagnosis not present

## 2016-02-15 DIAGNOSIS — Z87891 Personal history of nicotine dependence: Secondary | ICD-10-CM | POA: Insufficient documentation

## 2016-02-15 DIAGNOSIS — I252 Old myocardial infarction: Secondary | ICD-10-CM | POA: Diagnosis not present

## 2016-02-15 DIAGNOSIS — F329 Major depressive disorder, single episode, unspecified: Secondary | ICD-10-CM | POA: Insufficient documentation

## 2016-02-15 DIAGNOSIS — I5032 Chronic diastolic (congestive) heart failure: Secondary | ICD-10-CM | POA: Diagnosis not present

## 2016-02-15 DIAGNOSIS — M797 Fibromyalgia: Secondary | ICD-10-CM | POA: Insufficient documentation

## 2016-02-15 DIAGNOSIS — Z955 Presence of coronary angioplasty implant and graft: Secondary | ICD-10-CM | POA: Insufficient documentation

## 2016-02-15 DIAGNOSIS — R0789 Other chest pain: Secondary | ICD-10-CM | POA: Diagnosis not present

## 2016-02-15 DIAGNOSIS — E669 Obesity, unspecified: Secondary | ICD-10-CM | POA: Diagnosis not present

## 2016-02-15 DIAGNOSIS — E1165 Type 2 diabetes mellitus with hyperglycemia: Secondary | ICD-10-CM | POA: Insufficient documentation

## 2016-02-15 DIAGNOSIS — F418 Other specified anxiety disorders: Secondary | ICD-10-CM | POA: Diagnosis not present

## 2016-02-15 DIAGNOSIS — F419 Anxiety disorder, unspecified: Secondary | ICD-10-CM | POA: Diagnosis not present

## 2016-02-15 DIAGNOSIS — Z79899 Other long term (current) drug therapy: Secondary | ICD-10-CM | POA: Diagnosis not present

## 2016-02-15 DIAGNOSIS — Z7902 Long term (current) use of antithrombotics/antiplatelets: Secondary | ICD-10-CM | POA: Diagnosis not present

## 2016-02-15 DIAGNOSIS — E119 Type 2 diabetes mellitus without complications: Secondary | ICD-10-CM

## 2016-02-15 DIAGNOSIS — E876 Hypokalemia: Secondary | ICD-10-CM | POA: Diagnosis not present

## 2016-02-15 DIAGNOSIS — E039 Hypothyroidism, unspecified: Secondary | ICD-10-CM | POA: Diagnosis present

## 2016-02-15 DIAGNOSIS — I110 Hypertensive heart disease with heart failure: Secondary | ICD-10-CM | POA: Diagnosis not present

## 2016-02-15 DIAGNOSIS — F40240 Claustrophobia: Secondary | ICD-10-CM | POA: Insufficient documentation

## 2016-02-15 DIAGNOSIS — R51 Headache: Secondary | ICD-10-CM

## 2016-02-15 DIAGNOSIS — R079 Chest pain, unspecified: Secondary | ICD-10-CM | POA: Diagnosis present

## 2016-02-15 DIAGNOSIS — F17200 Nicotine dependence, unspecified, uncomplicated: Secondary | ICD-10-CM | POA: Diagnosis present

## 2016-02-15 DIAGNOSIS — Z720 Tobacco use: Secondary | ICD-10-CM | POA: Diagnosis not present

## 2016-02-15 DIAGNOSIS — Z7982 Long term (current) use of aspirin: Secondary | ICD-10-CM | POA: Insufficient documentation

## 2016-02-15 DIAGNOSIS — F1210 Cannabis abuse, uncomplicated: Secondary | ICD-10-CM | POA: Diagnosis not present

## 2016-02-15 DIAGNOSIS — I10 Essential (primary) hypertension: Secondary | ICD-10-CM | POA: Diagnosis present

## 2016-02-15 DIAGNOSIS — Z794 Long term (current) use of insulin: Secondary | ICD-10-CM | POA: Diagnosis not present

## 2016-02-15 DIAGNOSIS — I2510 Atherosclerotic heart disease of native coronary artery without angina pectoris: Secondary | ICD-10-CM | POA: Diagnosis not present

## 2016-02-15 DIAGNOSIS — Z9861 Coronary angioplasty status: Secondary | ICD-10-CM

## 2016-02-15 DIAGNOSIS — G4733 Obstructive sleep apnea (adult) (pediatric): Secondary | ICD-10-CM | POA: Diagnosis not present

## 2016-02-15 DIAGNOSIS — R519 Headache, unspecified: Secondary | ICD-10-CM | POA: Diagnosis present

## 2016-02-15 DIAGNOSIS — G894 Chronic pain syndrome: Secondary | ICD-10-CM | POA: Diagnosis not present

## 2016-02-15 LAB — BASIC METABOLIC PANEL
Anion gap: 9 (ref 5–15)
BUN: 6 mg/dL (ref 6–20)
CHLORIDE: 99 mmol/L — AB (ref 101–111)
CO2: 28 mmol/L (ref 22–32)
Calcium: 8.5 mg/dL — ABNORMAL LOW (ref 8.9–10.3)
Creatinine, Ser: 0.71 mg/dL (ref 0.44–1.00)
GFR calc Af Amer: >60 mL/min (ref >60)
GLUCOSE: 323 mg/dL — AB (ref 65–99)
POTASSIUM: 3.2 mmol/L — AB (ref 3.5–5.1)
Sodium: 136 mmol/L (ref 135–145)

## 2016-02-15 LAB — RAPID URINE DRUG SCREEN, HOSP PERFORMED
AMPHETAMINES: NOT DETECTED
BARBITURATES: NOT DETECTED
BENZODIAZEPINES: NOT DETECTED
Cocaine: NOT DETECTED
Opiates: NOT DETECTED
Tetrahydrocannabinol: POSITIVE — AB

## 2016-02-15 LAB — CBC
HEMATOCRIT: 37.9 % (ref 36.0–46.0)
Hemoglobin: 12.7 g/dL (ref 12.0–15.0)
MCH: 29.7 pg (ref 26.0–34.0)
MCHC: 33.5 g/dL (ref 30.0–36.0)
MCV: 88.8 fL (ref 78.0–100.0)
Platelets: 231 10*3/uL (ref 150–400)
RBC: 4.27 MIL/uL (ref 3.87–5.11)
RDW: 13.1 % (ref 11.5–15.5)
WBC: 9.5 10*3/uL (ref 4.0–10.5)

## 2016-02-15 LAB — GLUCOSE, CAPILLARY: Glucose-Capillary: 311 mg/dL — ABNORMAL HIGH (ref 65–99)

## 2016-02-15 LAB — LIPID PANEL
CHOLESTEROL: 213 mg/dL — AB (ref 0–200)
HDL: 49 mg/dL (ref >40)
LDL Cholesterol: 117 mg/dL — ABNORMAL HIGH (ref 0–99)
TRIGLYCERIDES: 233 mg/dL — AB (ref <150)
Total CHOL/HDL Ratio: 4.3 RATIO
VLDL: 47 mg/dL — AB (ref 0–40)

## 2016-02-15 LAB — TSH: TSH: 64.507 u[IU]/mL — AB (ref 0.350–4.500)

## 2016-02-15 LAB — I-STAT TROPONIN, ED: Troponin i, poc: 0.01 ng/mL (ref 0.00–0.08)

## 2016-02-15 MED ORDER — MORPHINE SULFATE (PF) 2 MG/ML IV SOLN
2.0000 mg | INTRAVENOUS | Status: DC | PRN
  Administered 2016-02-16 (×2): 2 mg via INTRAVENOUS
  Filled 2016-02-15 (×2): qty 1

## 2016-02-15 MED ORDER — ACETAMINOPHEN 325 MG PO TABS: 650.0000 mg | ORAL_TABLET | ORAL | Status: DC | PRN

## 2016-02-15 MED ORDER — FUROSEMIDE 40 MG PO TABS
40.0000 mg | ORAL_TABLET | Freq: Every day | ORAL | Status: DC
  Administered 2016-02-16 – 2016-02-17 (×2): 40 mg via ORAL
  Filled 2016-02-15 (×2): qty 1

## 2016-02-15 MED ORDER — LORAZEPAM 1 MG PO TABS
1.0000 mg | ORAL_TABLET | Freq: Every day | ORAL | Status: DC
  Administered 2016-02-16 – 2016-02-17 (×2): 1 mg via ORAL
  Filled 2016-02-15 (×2): qty 1

## 2016-02-15 MED ORDER — INSULIN ASPART 100 UNIT/ML ~~LOC~~ SOLN
0.0000 [IU] | Freq: Three times a day (TID) | SUBCUTANEOUS | Status: DC
Start: 2016-02-16 — End: 2016-02-17
  Administered 2016-02-16 (×2): 5 [IU] via SUBCUTANEOUS
  Administered 2016-02-16: 2 [IU] via SUBCUTANEOUS

## 2016-02-15 MED ORDER — ONDANSETRON HCL 4 MG/2ML IJ SOLN
4.0000 mg | Freq: Four times a day (QID) | INTRAMUSCULAR | Status: DC | PRN
  Administered 2016-02-16: 4 mg via INTRAVENOUS

## 2016-02-15 MED ORDER — ATORVASTATIN CALCIUM 80 MG PO TABS
80.0000 mg | ORAL_TABLET | Freq: Every day | ORAL | Status: DC
  Administered 2016-02-16: 80 mg via ORAL
  Filled 2016-02-15: qty 1

## 2016-02-15 MED ORDER — GI COCKTAIL ~~LOC~~: 30.0000 mL | Freq: Four times a day (QID) | ORAL | Status: DC | PRN

## 2016-02-15 MED ORDER — ISOSORBIDE MONONITRATE ER 30 MG PO TB24
30.0000 mg | ORAL_TABLET | Freq: Every day | ORAL | Status: DC
  Administered 2016-02-16 – 2016-02-17 (×2): 30 mg via ORAL
  Filled 2016-02-15 (×2): qty 1

## 2016-02-15 MED ORDER — POTASSIUM CHLORIDE CRYS ER 20 MEQ PO TBCR
40.0000 meq | EXTENDED_RELEASE_TABLET | Freq: Two times a day (BID) | ORAL | Status: DC
  Administered 2016-02-16 – 2016-02-17 (×4): 40 meq via ORAL
  Filled 2016-02-15 (×4): qty 2

## 2016-02-15 MED ORDER — CARVEDILOL 3.125 MG PO TABS
3.1250 mg | ORAL_TABLET | Freq: Two times a day (BID) | ORAL | Status: DC
  Administered 2016-02-17: 3.125 mg via ORAL
  Filled 2016-02-15 (×2): qty 1

## 2016-02-15 MED ORDER — CLOPIDOGREL BISULFATE 75 MG PO TABS
75.0000 mg | ORAL_TABLET | Freq: Every day | ORAL | Status: DC
  Administered 2016-02-16 – 2016-02-17 (×2): 75 mg via ORAL
  Filled 2016-02-15 (×2): qty 1

## 2016-02-15 MED ORDER — LEVOTHYROXINE SODIUM 100 MCG PO TABS
200.0000 ug | ORAL_TABLET | Freq: Every day | ORAL | Status: DC
  Administered 2016-02-16 – 2016-02-17 (×2): 200 ug via ORAL
  Filled 2016-02-15 (×2): qty 2

## 2016-02-15 MED ORDER — POTASSIUM CHLORIDE CRYS ER 20 MEQ PO TBCR
40.0000 meq | EXTENDED_RELEASE_TABLET | Freq: Once | ORAL | Status: AC
  Administered 2016-02-15: 40 meq via ORAL
  Filled 2016-02-15: qty 2

## 2016-02-15 MED ORDER — NICOTINE 14 MG/24HR TD PT24
14.0000 mg | MEDICATED_PATCH | Freq: Every day | TRANSDERMAL | Status: DC
  Administered 2016-02-16 (×2): 14 mg via TRANSDERMAL
  Filled 2016-02-15 (×2): qty 1

## 2016-02-15 MED ORDER — LISINOPRIL 10 MG PO TABS
10.0000 mg | ORAL_TABLET | Freq: Every day | ORAL | Status: DC
  Administered 2016-02-16: 10 mg via ORAL
  Filled 2016-02-15: qty 1

## 2016-02-15 MED ORDER — ENOXAPARIN SODIUM 40 MG/0.4ML ~~LOC~~ SOLN: 40.0000 mg | Freq: Every day | SUBCUTANEOUS | Status: DC

## 2016-02-15 MED ORDER — NITROGLYCERIN 0.4 MG SL SUBL: 0.4000 mg | SUBLINGUAL_TABLET | SUBLINGUAL | Status: DC | PRN

## 2016-02-15 MED ORDER — ASPIRIN EC 81 MG PO TBEC
81.0000 mg | DELAYED_RELEASE_TABLET | Freq: Every day | ORAL | Status: DC
  Administered 2016-02-16 – 2016-02-17 (×2): 81 mg via ORAL
  Filled 2016-02-15 (×2): qty 1

## 2016-02-15 MED ORDER — FAMOTIDINE 20 MG PO TABS
20.0000 mg | ORAL_TABLET | Freq: Two times a day (BID) | ORAL | Status: DC
  Administered 2016-02-16 – 2016-02-17 (×4): 20 mg via ORAL
  Filled 2016-02-15 (×4): qty 1

## 2016-02-15 MED ORDER — MORPHINE SULFATE (PF) 4 MG/ML IV SOLN
4.0000 mg | Freq: Once | INTRAVENOUS | Status: AC
  Administered 2016-02-15: 4 mg via INTRAVENOUS
  Filled 2016-02-15: qty 1

## 2016-02-15 MED ORDER — NITROGLYCERIN 0.4 MG SL SUBL
0.4000 mg | SUBLINGUAL_TABLET | SUBLINGUAL | Status: AC | PRN
  Administered 2016-02-15 (×3): 0.4 mg via SUBLINGUAL
  Filled 2016-02-15: qty 1

## 2016-02-15 MED ORDER — FLUOXETINE HCL 20 MG PO CAPS
40.0000 mg | ORAL_CAPSULE | Freq: Every day | ORAL | Status: DC
  Administered 2016-02-16 – 2016-02-17 (×2): 40 mg via ORAL
  Filled 2016-02-15 (×2): qty 2

## 2016-02-15 MED ORDER — ACETAMINOPHEN 500 MG PO TABS
1000.0000 mg | ORAL_TABLET | Freq: Once | ORAL | Status: AC
  Administered 2016-02-15: 1000 mg via ORAL
  Filled 2016-02-15: qty 2

## 2016-02-15 MED ORDER — INSULIN DETEMIR 100 UNIT/ML ~~LOC~~ SOLN
35.0000 [IU] | Freq: Every day | SUBCUTANEOUS | Status: DC
  Administered 2016-02-16 (×2): 35 [IU] via SUBCUTANEOUS
  Filled 2016-02-15 (×3): qty 0.35

## 2016-02-15 NOTE — ED Notes (Signed)
Reporting CP at 9/10 at this time.  First dose ntg sl given at this time.

## 2016-02-15 NOTE — ED Notes (Signed)
Still rating chest pain 9/10.  Second dose ntg sl given at this time.

## 2016-02-15 NOTE — ED Provider Notes (Signed)
[facility] DEPT Provider Note   CSN: [id number] Arrival date & time: 02/15/16  1819     History   Chief Complaint Chief Complaint  Patient presents with  . Chest Pain    HPI Jacqueline Munoz is a 55 y.o. female.Complains of anterior chest pain rating to left arm and left neck onset 2 or 3 PM today. Pain not made better or worse by anything. Treated by EMS with one sublingual nitroglycerin by EMS and aspirin, without relief. Also has had headache for approximate the past week. Gradual onset. No other associated symptoms. Nothing makes symptoms better or worse. No other associated symptoms.  HPI  Past Medical History:  Diagnosis Date  . Arthritis   . CAD S/P percutaneous coronary angioplasty    a. NSTEMI: s/p DES x2 LAD and DES to bifurcation of OM1 and mid to distal LCx (11/16/14) b. UA/ACS:  overlapping DES x1 to LAD for de novo 99% focal lesion in LAD (12/02/2014)  . Chronic back pain   . Chronic depression   . Coronary artery chronic total occlusion: 100% RCA CTO 11/16/2014   a. Prox RCA to Mid RCA lesion, 100% stenosed.   . Diabetes mellitus, type 2 ([place])   . Essential hypertension   . Fibromyalgia   . History of stomach ulcers   . History of tobacco abuse   . HLD (hyperlipidemia)   . HLD (hyperlipidemia)   . Hypothyroid   . Kidney stones   . OSA (obstructive sleep apnea)    a. does not wear CPAP  . Type IVa MI - Peri-PCI 11/17/2014   Prolonged ischemia during bifurcation PCI of  m-d Cx-OM1 with minicrush technique    Patient Active Problem List   Diagnosis Date Noted  . Abdominal pain, epigastric 12/08/2015  . Subcutaneous nodules 10/17/2015  . Herpes zoster 10/17/2015  . History of Non-ST elevated myocardial infarction (non-STEMI) ([place]) 08/27/2015  . Painful lumpy left breast 08/21/2015  . Vaginal polyp 08/21/2015  . Vaginal bleeding 08/21/2015  . Costochondritis 07/20/2015  . Demand ischemia ([place]) 06/15/2015  . Fibromyalgia   . Diabetes mellitus, type 2 ([place])    . CAD S/P percutaneous coronary angioplasty   . HLD (hyperlipidemia)   . Chronic diastolic heart failure ([place])   . Cephalalgia   . Mixed hyperlipidemia   . Upper airway cough syndrome 03/25/2015  . Post herpetic neuralgia T6/8 on L  03/25/2015  . Obesity 03/25/2015  . Incidental lung nodule, > 91m and < 862m09/19/2016  . Leg swelling 03/20/2015  . Fatigue 02/02/2015  . Lumbar back pain 01/20/2015  . Vitamin D deficiency 01/06/2015  . Intercostal muscle pain 01/05/2015  . Chronic pain syndrome 01/05/2015  . Vertigo, constant 12/22/2014  . Voice hoarseness 12/14/2014  . Temporomandibular joint disorders 12/07/2014  . Anxiety and depression 11/23/2014  . Knee pain, right 11/23/2014  . Abdominal pain, left upper quadrant 11/23/2014  . Former very heavy cigarette smoker (more than 40 per day) 11/18/2014  . Essential hypertension 11/15/2014  . Hypothyroid 11/15/2014    Past Surgical History:  Procedure Laterality Date  . CARDIAC CATHETERIZATION N/A 11/16/2014   Procedure: Left Heart Cath and Coronary Angiography;  Surgeon: Peter M JoMartiniqueMD;  Location: [facility];  Service: Cardiovascular;  RCA 100% CTO, m-dLAD 90%, m-dCx 95%, OM1 90%  . CARDIAC CATHETERIZATION  11/16/2014   Procedure: Coronary Stent Intervention;  Surgeon: Peter M JoMartiniqueMD;  Location: [facility];  Service: Cardiovascular;;m-d LAD overlapping 3.0 x 16 &  2.75 x 16 Promus P DES; OM1-dCx Minicrush bifurcation (dCx 2.25 x 38 Synergy DES crushed with 2.75 x 24 Synergy DES in OM1)   . CARDIAC CATHETERIZATION N/A 12/02/2014   Procedure: Left Heart Cath and Coronary Angiography;  Surgeon: Marykay Lex, MD;  Location: [facility] INVASIVE CV LAB;  Service: Cardiovascular;  Previous Stents in LAD & Cx-OM patent, mLAD prox to stents 99%  . CARDIAC CATHETERIZATION N/A 12/02/2014   Procedure: Coronary Stent Intervention;  Surgeon: Marykay Lex, MD;  Location: [facility] INVASIVE CV LAB;  Service: Cardiovascular;  3rd overlapping  (prox) mLAD Promus Premier DES 3.0 x 20 (3.25 mm)  . CESAREAN SECTION  2000  . INNER EAR SURGERY Bilateral "several"  . TONSILLECTOMY    . TOTAL ABDOMINAL HYSTERECTOMY  ~ 2008  . TRANSTHORACIC ECHOCARDIOGRAM  5/19 & 6/ 2016   a) EF 65-70%, mild LVH, Gr 1 DD; b) EF 60% -ron RWMA, no valve lesions  . TUBAL LIGATION  2000    OB History    No data available       Home Medications    Prior to Admission medications   Medication Sig Start Date End Date Taking? Authorizing Provider  acetaminophen (TYLENOL) 500 MG chewable tablet Chew 1,000 mg by mouth every 6 (six) hours as needed for pain.   Yes Historical Provider, MD  aspirin EC 81 MG EC tablet Take 1 tablet (81 mg total) by mouth daily. 11/18/14  Yes Calvert Cantor, MD  atorvastatin (LIPITOR) 40 MG tablet TAKE 2 TABLETS BY MOUTH DAILY AT 6 PM. 08/25/15  Yes Josalyn Funches, MD  carvedilol (COREG) 3.125 MG tablet TAKE 1 TABLET BY MOUTH 2 TIMES DAILY WITH A MEAL. 02/09/16  Yes Josalyn Funches, MD  clopidogrel (PLAVIX) 75 MG tablet Take 1 tablet (75 mg total) by mouth daily. 01/05/15  Yes Josalyn Funches, MD  FLUoxetine (PROZAC) 20 MG capsule Take 40 mg by mouth daily.    Yes Historical Provider, MD  furosemide (LASIX) 40 MG tablet Take 1 tablet (40 mg total) by mouth daily. 06/15/15  Yes Janetta Hora, PA-C  glucose monitoring kit (FREESTYLE) monitoring kit 1 each by Does not apply route 4 (four) times daily - after meals and at bedtime. 1 month Diabetic Testing Supplies for QAC-QHS accuchecks. 11/16/14  Yes Calvert Cantor, MD  insulin aspart (NOVOLOG) 100 UNIT/ML injection Inject 0-10 Units into the skin 3 (three) times daily as needed for high blood sugar (as needed for blood sugar coverage-sliding scale).   Yes Historical Provider, MD  Insulin Detemir (LEVEMIR) 100 UNIT/ML Pen INJECT 35 UNITS INTO THE SKIN AT BEDTIME. 06/27/15  Yes Jaclyn Shaggy, MD  Insulin Syringe-Needle U-100 (TRUEPLUS INSULIN SYRINGE) 30G X 5/16" 0.5 ML MISC 1 each by Does  not apply route 4 (four) times daily. 03/02/15  Yes Josalyn Funches, MD  isosorbide mononitrate (IMDUR) 30 MG 24 hr tablet TAKE 1 TABLET BY MOUTH DAILY. 10/03/15  Yes Marykay Lex, MD  levothyroxine (SYNTHROID, LEVOTHROID) 200 MCG tablet Take 1 tablet (200 mcg total) by mouth daily before breakfast. 06/15/15  Yes Janetta Hora, PA-C  lisinopril (PRINIVIL,ZESTRIL) 10 MG tablet Take 10 mg by mouth daily.   Yes Historical Provider, MD  LORazepam (ATIVAN) 1 MG tablet Take 1 mg by mouth daily.   Yes Historical Provider, MD  metFORMIN (GLUCOPHAGE) 500 MG tablet TAKE 1 TABLET BY MOUTH 2 TIMES DAILY WITH A MEAL. 02/09/16  Yes Josalyn Funches, MD  nitroGLYCERIN (NITROSTAT) 0.4 MG SL tablet Place 0.4  mg under the tongue every 5 (five) minutes as needed for chest pain (may take three doses by mouth per day as needed).   Yes Historical Provider, MD  ondansetron (ZOFRAN ODT) 8 MG disintegrating tablet Take 1 tablet (8 mg total) by mouth every 8 (eight) hours as needed for nausea or vomiting. 12/08/15  Yes Josalyn Funches, MD  potassium chloride 20 MEQ TBCR Take 40 mEq by mouth 2 (two) times daily. 02/09/16 02/15/16 Yes Shari Upstill, PA-C  ranitidine (ZANTAC) 150 MG tablet Take 1 tablet (150 mg total) by mouth 2 (two) times daily. 12/08/15  Yes Josalyn Funches, MD  acyclovir (ZOVIRAX) 400 MG tablet Take 1 tablet (400 mg total) by mouth 3 (three) times daily. Patient not taking: Reported on 02/09/2016 10/17/15   Boykin Nearing, MD  atorvastatin (LIPITOR) 40 MG tablet TAKE 2 TABLETS BY MOUTH DAILY AT 6 PM. 02/09/16   Josalyn Funches, MD  sucralfate (CARAFATE) 1 GM/10ML suspension Take 10 mLs (1 g total) by mouth 4 (four) times daily -  with meals and at bedtime. Patient not taking: Reported on 02/09/2016 12/08/15   Boykin Nearing, MD    Family History Family History  Problem Relation Age of Onset  . Hyperlipidemia Mother   . Sudden death Father   . Emphysema Maternal Grandmother     smoked    Social  History Social History  Substance Use Topics  . Smoking status: Former Smoker    Packs/day: 1.50    Years: 39.00    Types: Cigarettes    Quit date: 11/10/2014  . Smokeless tobacco: Never Used  . Alcohol use No     Comment: 11/15/2014 "might have a few drinks/yr"     Allergies   Corticosteroids; Lyrica [pregabalin]; Penicillins; Amitriptyline; and Gabapentin   Review of Systems Review of Systems  Constitutional: Negative.   Respiratory: Negative.   Cardiovascular: Positive for chest pain.  Gastrointestinal: Negative.   Musculoskeletal: Negative.   Skin: Negative.   Allergic/Immunologic: Positive for immunocompromised state.  Neurological: Positive for headaches.  Psychiatric/Behavioral: Negative.   All other systems reviewed and are negative.    Physical Exam Updated Vital Signs BP 133/78   Pulse (!) 59   Resp 23   SpO2 100%   Physical Exam  Constitutional: She appears well-developed and well-nourished.  HENT:  Head: Normocephalic and atraumatic.  Eyes: Conjunctivae are normal. Pupils are equal, round, and reactive to light.  Neck: Neck supple. No tracheal deviation present. No thyromegaly present.  Cardiovascular: Normal rate and regular rhythm.   No murmur heard. Pulmonary/Chest: Effort normal and breath sounds normal.  Abdominal: Soft. Bowel sounds are normal. She exhibits no distension. There is no tenderness.  Musculoskeletal: Normal range of motion. She exhibits no edema or tenderness.  Neurological: She is alert. Coordination normal.  Skin: Skin is warm and dry. No rash noted.  Psychiatric: She has a normal mood and affect.  Nursing note and vitals reviewed.    ED Treatments / Results  Labs (all labs ordered are listed, but only abnormal results are displayed) Labs Reviewed  BASIC METABOLIC PANEL  [place], ED    EKG  EKG Interpretation  Date/Time:  Thursday February 15 2016 18:24:46 EDT Ventricular Rate:  62 PR Interval:    QRS  Duration: 77 QT Interval:  451 QTC Calculation: 458 R Axis:   31 Text Interpretation:  Sinus rhythm Baseline wander in lead(s) V2 No significant change since last tracing Confirmed by Winfred Leeds  MD, Nuria Phebus [phone]) on  02/15/2016 7:13:41 PM       Radiology No results found.  Procedures Procedures (including critical care time)  Medications Ordered in ED Medications  nitroGLYCERIN (NITROSTAT) SL tablet 0.4 mg (not administered)     Initial Impression / Assessment and Plan / ED Course  I have reviewed the triage vital signs and the nursing notes.  Pertinent labs & imaging results that were available during my care of the patient were reviewed by me and considered in my medical decision making (see chart for details).  Clinical Course   At 8:20 PM patient's chest pain has resolved after treatment with 3 sublingual nitroglycerin. She continues to complain of headache. Morphine IV ordered.  Chest x-ray viewed by me Results for orders placed or performed during the hospital encounter of 33/82/50  Basic metabolic panel  Result Value Ref Range   Sodium 136 135 - 145 mmol/L   Potassium 3.2 (L) 3.5 - 5.1 mmol/L   Chloride 99 (L) 101 - 111 mmol/L   CO2 28 22 - 32 mmol/L   Glucose, Bld 323 (H) 65 - 99 mg/dL   BUN 6 6 - 20 mg/dL   Creatinine, Ser 0.71 0.44 - 1.00 mg/dL   Calcium 8.5 (L) 8.9 - 10.3 mg/dL   GFR calc non Af Amer >60 >60 mL/min   GFR calc Af Amer >60 >60 mL/min   Anion gap 9 5 - 15  CBC  Result Value Ref Range   WBC 9.5 4.0 - 10.5 K/uL   RBC 4.27 3.87 - 5.11 MIL/uL   Hemoglobin 12.7 12.0 - 15.0 g/dL   HCT 37.9 36.0 - 46.0 %   MCV 88.8 78.0 - 100.0 fL   MCH 29.7 26.0 - 34.0 pg   MCHC 33.5 30.0 - 36.0 g/dL   RDW 13.1 11.5 - 15.5 %   Platelets 231 150 - 400 K/uL  I-stat troponin, ED  Result Value Ref Range   Troponin i, poc 0.01 0.00 - 0.08 ng/mL   Comment 3           Dg Chest 2 View  Result Date: 02/15/2016 CLINICAL DATA:  Central chest pain today. EXAM: CHEST   2 VIEW COMPARISON:  Two-view chest x-ray 11/25/2015. FINDINGS: The heart size is normal. The lungs are clear. The visualized soft tissues and bony thorax are unremarkable. IMPRESSION: Negative two view chest x-ray Electronically Signed   By: San Morelle M.D.   On: 02/15/2016 19:55    9:20 PM pain headache improved after treatment with intravenous morphine. I spoke with Dr. Tommi Rumps, cardiologist via telephone. Okay for hospitalist to manage patient. Hospital's can call cardiologist consult if needed. Dr. Lorin Mercy consulted and evaluated patient in ED. Plan 23 hour observation telemetry Final Clinical Impressions(s) / ED Diagnoses   diagnosis #1 chest pain at rest #2 hypokalemia #3 hyperglycemia #4 nonspecific headache #5 tobacco abuse Final diagnoses:  None    New Prescriptions New Prescriptions   No medications on file     Orlie Dakin, MD 02/15/16 2124

## 2016-02-15 NOTE — H&P (Signed)
History and Physical    Jacqueline Munoz UUV:[id number] DOB: 21-Oct-1960 DOA: 02/15/2016  PCP: [name], MD Consultants:  Ellyn Hack - cardiology Patient coming from: home - lives with 55yo daughter; NOK: French Ana, [phone]  Chief Complaint: chest pain  HPI: Jacqueline Munoz is a 55 y.o. female with medical history significant of CAD s/p stents in May 2016 as well as HTN, DM, HLD, tobacco dependence presenting with acute onset of chest pain.  Patient reports that she was at the ER last Friday, glucose >400, given IVF, had headache.  Eased off with Percocet but not resolved.  Headache has been persistent since Friday.  This AM, it was hurting really bad and she could feel her heart beating in her head.  About 1400, chest pain started - "my chest felt full, my head felt full."  Has h/o May 2016 2 MIs, 5 stents, also diagnosed with CHF with 1 hospitalization (12/12-12/15/16; EF 75-64%, grade 2 diastolic dysfunction).  Weight has been up 16 pounds recently but has been taking Lasix to bring it down.  Substernal chest pressure and a little to the left, also with L arm and back pain radiation.  Non-exertional.  Relieved with NTG.  This pressure feels more like with CHF exacerbation than prior MI, but she is not having significant SOB like last CHF exacerbation.   No diaphoresis but did have nausea without vomiting.  Also has chronic pain from fibromyalgia.    ED Course: diagnosis #1 chest pain at rest #2 hypokalemia #3 hyperglycemia #4 nonspecific headache #5 tobacco abuse  I spoke with Dr. Tommi Rumps, cardiologist via telephone. Okay for hospitalist to manage patient. Hospital's can call cardiologist consult if needed. Dr. Lorin Mercy consulted and evaluated patient in ED. Plan 23 hour observation telemetry  Review of Systems: As per HPI; otherwise 10 point review of systems reviewed and negative.   Ambulatory Status: ambulates independently  Past Medical History:  Diagnosis Date  .  Arthritis   . CAD S/P percutaneous coronary angioplasty    a. NSTEMI: s/p DES x2 LAD and DES to bifurcation of OM1 and mid to distal LCx (11/16/14) b. UA/ACS:  overlapping DES x1 to LAD for de novo 99% focal lesion in LAD (12/02/2014)  . Chronic back pain   . Chronic depression   . Coronary artery chronic total occlusion: 100% RCA CTO 11/16/2014   a. Prox RCA to Mid RCA lesion, 100% stenosed.   . Diabetes mellitus, type 2 ([place])   . Essential hypertension   . Fibromyalgia   . History of stomach ulcers   . History of tobacco abuse   . HLD (hyperlipidemia)   . Hypothyroid   . Kidney stones   . OSA (obstructive sleep apnea)    a. does not wear CPAP  . Type IVa MI - Peri-PCI 11/17/2014   Prolonged ischemia during bifurcation PCI of  m-d Cx-OM1 with minicrush technique    Past Surgical History:  Procedure Laterality Date  . CARDIAC CATHETERIZATION N/A 11/16/2014   Procedure: Left Heart Cath and Coronary Angiography;  Surgeon: Peter M Martinique, MD;  Location: [facility];  Service: Cardiovascular;  RCA 100% CTO, m-dLAD 90%, m-dCx 95%, OM1 90%  . CARDIAC CATHETERIZATION  11/16/2014   Procedure: Coronary Stent Intervention;  Surgeon: Peter M Martinique, MD;  Location: [facility];  Service: Cardiovascular;;m-d LAD overlapping 3.0 x 16 & 2.75 x 16 Promus P DES; OM1-dCx Minicrush bifurcation (dCx 2.25 x 38 Synergy DES crushed with 2.75 x 24 Synergy  DES in [place])   . CARDIAC CATHETERIZATION N/A 12/02/2014   Procedure: Left Heart Cath and Coronary Angiography;  Surgeon: Leonie Man, MD;  Location: [facility];  Service: Cardiovascular;  Previous Stents in LAD & Cx-OM patent, mLAD prox to stents 99%  . CARDIAC CATHETERIZATION N/A 12/02/2014   Procedure: Coronary Stent Intervention;  Surgeon: Leonie Man, MD;  Location: [facility];  Service: Cardiovascular;  3rd overlapping (prox) mLAD Promus Premier DES 3.0 x 20 (3.25 mm)  . CESAREAN SECTION  2000  . INNER EAR SURGERY Bilateral  "several"  . TONSILLECTOMY    . TOTAL ABDOMINAL HYSTERECTOMY  ~ 2008  . TRANSTHORACIC ECHOCARDIOGRAM  5/19 & 6/ 2016   a) EF 65-70%, mild LVH, Gr 1 DD; b) EF 60% -ron RWMA, no valve lesions  . TUBAL LIGATION  2000    Social History   Social History  . Marital status: Widowed    Spouse name: N/A  . Number of children: N/A  . Years of education: N/A   Occupational History  . disabled    Social History Main Topics  . Smoking status: Former Smoker    Packs/day: 1.50    Years: 39.00    Types: Cigarettes    Quit date: 11/10/2014  . Smokeless tobacco: Never Used     Comment: started back but quit again 4 days ago, requests patch  . Alcohol use No     Comment: 11/15/2014 "might have a few drinks/yr"  . Drug use:     Types: Marijuana     Comment: last marijuana use 02/14/16  . Sexual activity: Not on file   Other Topics Concern  . Not on file   Social History Narrative   Patient reports living in a motel.  She is thinking of moving to a motel at [employer] during the off-season.    Allergies  Allergen Reactions  . Corticosteroids Anaphylaxis    ALL steroids.     Recardo Evangelist [Pregabalin] Shortness Of Breath and Swelling  . Penicillins Anaphylaxis    Has patient had a PCN reaction causing immediate rash, facial/tongue/throat swelling, SOB or lightheadedness with hypotension: {Yes Has patient had a PCN reaction causing severe rash involving mucus membranes or skin necrosis: NO Has patient had a PCN reaction that required hospitalization NO Has patient had a PCN reaction occurring within the last 10 years: {Yes If all of the above answers are "NO", then may proceed with Cephalosporin use.   . Amitriptyline Other (See Comments)    Muscle Cramps  . Gabapentin Swelling    Family History  Problem Relation Age of Onset  . Hyperlipidemia Mother   . Sudden death Father   . Emphysema Maternal Grandmother     smoked    Prior to Admission medications   Medication Sig Start Date  End Date Taking? Authorizing Provider  acetaminophen (TYLENOL) 500 MG chewable tablet Chew 1,000 mg by mouth every 6 (six) hours as needed for pain.   Yes Historical Provider, MD  aspirin EC 81 MG EC tablet Take 1 tablet (81 mg total) by mouth daily. 11/18/14  Yes Debbe Odea, MD  atorvastatin (LIPITOR) 40 MG tablet TAKE 2 TABLETS BY MOUTH DAILY AT 6 PM. 08/25/15  Yes Josalyn Funches, MD  carvedilol (COREG) 3.125 MG tablet TAKE 1 TABLET BY MOUTH 2 TIMES DAILY WITH A MEAL. 02/09/16  Yes Josalyn Funches, MD  clopidogrel (PLAVIX) 75 MG tablet Take 1 tablet (75 mg total) by mouth daily.  01/05/15  Yes Josalyn Funches, MD  FLUoxetine (PROZAC) 20 MG capsule Take 40 mg by mouth daily.    Yes Historical Provider, MD  furosemide (LASIX) 40 MG tablet Take 1 tablet (40 mg total) by mouth daily. 06/15/15  Yes Eileen Stanford, PA-C  glucose monitoring kit (FREESTYLE) monitoring kit 1 each by Does not apply route 4 (four) times daily - after meals and at bedtime. 1 month Diabetic Testing Supplies for QAC-QHS accuchecks. 11/16/14  Yes Debbe Odea, MD  insulin aspart (NOVOLOG) 100 UNIT/ML injection Inject 0-10 Units into the skin 3 (three) times daily as needed for high blood sugar (as needed for blood sugar coverage-sliding scale).   Yes Historical Provider, MD  Insulin Detemir (LEVEMIR) 100 UNIT/ML Pen INJECT 35 UNITS INTO THE SKIN AT BEDTIME. 06/27/15  Yes Arnoldo Morale, MD  Insulin Syringe-Needle U-100 (TRUEPLUS INSULIN SYRINGE) 30G X 5/16" 0.5 ML MISC 1 each by Does not apply route 4 (four) times daily. 03/02/15  Yes Josalyn Funches, MD  isosorbide mononitrate (IMDUR) 30 MG 24 hr tablet TAKE 1 TABLET BY MOUTH DAILY. 10/03/15  Yes Leonie Man, MD  levothyroxine (SYNTHROID, LEVOTHROID) 200 MCG tablet Take 1 tablet (200 mcg total) by mouth daily before breakfast. 06/15/15  Yes Eileen Stanford, PA-C  lisinopril (PRINIVIL,ZESTRIL) 10 MG tablet Take 10 mg by mouth daily.   Yes Historical Provider, MD  LORazepam  (ATIVAN) 1 MG tablet Take 1 mg by mouth daily.   Yes Historical Provider, MD  metFORMIN (GLUCOPHAGE) 500 MG tablet TAKE 1 TABLET BY MOUTH 2 TIMES DAILY WITH A MEAL. 02/09/16  Yes Josalyn Funches, MD  nitroGLYCERIN (NITROSTAT) 0.4 MG SL tablet Place 0.4 mg under the tongue every 5 (five) minutes as needed for chest pain (may take three doses by mouth per day as needed).   Yes Historical Provider, MD  ondansetron (ZOFRAN ODT) 8 MG disintegrating tablet Take 1 tablet (8 mg total) by mouth every 8 (eight) hours as needed for nausea or vomiting. 12/08/15  Yes Josalyn Funches, MD  potassium chloride 20 MEQ TBCR Take 40 mEq by mouth 2 (two) times daily. 02/09/16 02/15/16 Yes Shari Upstill, PA-C  ranitidine (ZANTAC) 150 MG tablet Take 1 tablet (150 mg total) by mouth 2 (two) times daily. 12/08/15  Yes Josalyn Funches, MD  acyclovir (ZOVIRAX) 400 MG tablet Take 1 tablet (400 mg total) by mouth 3 (three) times daily. Patient not taking: Reported on 02/09/2016 10/17/15   Boykin Nearing, MD  atorvastatin (LIPITOR) 40 MG tablet TAKE 2 TABLETS BY MOUTH DAILY AT 6 PM. 02/09/16   Josalyn Funches, MD  sucralfate (CARAFATE) 1 GM/10ML suspension Take 10 mLs (1 g total) by mouth 4 (four) times daily -  with meals and at bedtime. Patient not taking: Reported on 02/09/2016 12/08/15   Boykin Nearing, MD    Physical Exam: Vitals:   02/15/16 2100 02/15/16 2115 02/15/16 2134 02/15/16 2145  BP: 155/92 142/73 165/76 160/77  Pulse: 61 (!) 58 64 60  Resp: '15 17 26 16  '$ SpO2: 100% 100% 100% 100%     General:  Appears calm and comfortable and is NAD; appears older than stated age and has green and red streaks throughout her blond hair Eyes:  PERRL, EOMI, normal lids, iris ENT:  grossly normal hearing, lips & tongue, mmm Neck:  no LAD, masses or thyromegaly Cardiovascular:  RRR, no m/r/g. No LE edema.  Respiratory:  CTA bilaterally, no w/r/r. Normal respiratory effort. Abdomen:  soft, ntnd, NABS Skin:  no  rash or induration seen  on limited exam Musculoskeletal:  grossly normal tone BUE/BLE, good ROM, no bony abnormality Psychiatric:  grossly normal mood and affect, speech fluent and appropriate, AOx3 Neurologic:  CN 2-12 grossly intact, moves all extremities in coordinated fashion, sensation intact  Labs on Admission: I have personally reviewed following labs and imaging studies  CBC:  Recent Labs Lab 02/09/16 0238 02/15/16 1900  WBC 9.8 9.5  NEUTROABS 5.3  --   HGB 12.8 12.7  HCT 39.0 37.9  MCV 88.6 88.8  PLT 235 811   Basic Metabolic Panel:  Recent Labs Lab 02/09/16 0238 02/15/16 1900  NA 135 136  K 3.0* 3.2*  CL 100* 99*  CO2 27 28  GLUCOSE 353* 323*  BUN 8 6  CREATININE 0.84 0.71  CALCIUM 8.9 8.5*   GFR: Estimated Creatinine Clearance: 79.1 mL/min (by C-G formula based on SCr of 0.8 mg/dL). Liver Function Tests:  Recent Labs Lab 02/09/16 0238  AST 14*  ALT 10*  ALKPHOS 62  BILITOT 0.4  PROT 5.8*  ALBUMIN 3.7   No results for input(s): LIPASE, AMYLASE in the last 168 hours. No results for input(s): AMMONIA in the last 168 hours. Coagulation Profile: No results for input(s): INR, PROTIME in the last 168 hours. Cardiac Enzymes: No results for input(s): CKTOTAL, CKMB, CKMBINDEX, TROPONINI in the last 168 hours. BNP (last 3 results) No results for input(s): PROBNP in the last 8760 hours. HbA1C: No results for input(s): HGBA1C in the last 72 hours. CBG:  Recent Labs Lab 02/09/16 0242 02/09/16 0643  GLUCAP 337* 356*   Lipid Profile: No results for input(s): CHOL, HDL, LDLCALC, TRIG, CHOLHDL, LDLDIRECT in the last 72 hours. Thyroid Function Tests: No results for input(s): TSH, T4TOTAL, FREET4, T3FREE, THYROIDAB in the last 72 hours. Anemia Panel: No results for input(s): VITAMINB12, FOLATE, FERRITIN, TIBC, IRON, RETICCTPCT in the last 72 hours. Urine analysis:    Component Value Date/Time   COLORURINE YELLOW 02/09/2016 0234   APPEARANCEUR CLEAR 02/09/2016 0234    LABSPEC 1.025 02/09/2016 0234   PHURINE 6.0 02/09/2016 0234   GLUCOSEU >1000 (A) 02/09/2016 0234   HGBUR NEGATIVE 02/09/2016 0234   BILIRUBINUR NEGATIVE 02/09/2016 0234   BILIRUBINUR small 07/20/2015 1043   KETONESUR NEGATIVE 02/09/2016 0234   PROTEINUR NEGATIVE 02/09/2016 0234   UROBILINOGEN 0.2 07/20/2015 1043   UROBILINOGEN 0.2 04/20/2015 2055   NITRITE NEGATIVE 02/09/2016 0234   LEUKOCYTESUR NEGATIVE 02/09/2016 0234    Creatinine Clearance: Estimated Creatinine Clearance: 79.1 mL/min (by C-G formula based on SCr of 0.8 mg/dL).  Sepsis Labs: '@LABRCNTIP'$ (procalcitonin:4,lacticidven:4) )No results found for this or any previous visit (from the past 240 hour(s)).   Radiological Exams on Admission: Dg Chest 2 View  Result Date: 02/15/2016 CLINICAL DATA:  Central chest pain today. EXAM: CHEST  2 VIEW COMPARISON:  Two-view chest x-ray 11/25/2015. FINDINGS: The heart size is normal. The lungs are clear. The visualized soft tissues and bony thorax are unremarkable. IMPRESSION: Negative two view chest x-ray Electronically Signed   By: San Morelle M.D.   On: 02/15/2016 19:55    EKG: Independently reviewed.  NSR with rate 62; NSCSLT  Assessment/Plan Principal Problem:   Chest pain Active Problems:   Essential hypertension   Hypothyroid   Anxiety and depression   Hypokalemia   Chronic pain syndrome   Obesity   Cephalalgia   Chronic diastolic heart failure (HCC)   Diabetes mellitus, type 2 (HCC)   CAD S/P percutaneous coronary angioplasty   Mild tetrahydrocannabinol (  THC) abuse   Tobacco dependence   OSA (obstructive sleep apnea)   Chest pain -Patient with substernal chest pain that came on at rest and was improved with NTG. -2/3 typical symptoms suggestive of atypical chest pain.  -CXR unremarkable.   -Initial cardiac enzymes negative.   -EKG not indicative of acute ischemia.   -TIMI risk score is 4; which predicts a 14 days risk of death, recurrent MI, or urgent  revascularization of 19.9%.  -Will plan to place in observation status on telemetry to rule out ACS by overnight observation.  -cycle cardiac enzymes q6h x 3 and repeat EKG in AM -Continue ASA 81 mg PO daily and Plavix 75 mg PO daily -morphine given; takes high-dose Lipitor daily and this will be continued -Continue Imdur -Risk factor stratification with FLP and HgbA1c; will also check TSH and UDS -MPS in AM -Cardiology consultation in AM - Dr. Allison Quarry group notified via Inbox message to the [place] for discharge to home tomorrow after stress testing if tests are negative for ischemia  HTN -Continue Coreg (after stress test), Lisinopril  Hypokalemia -Takes BID KCl but still slightly low in ER -Repleted orally per Dr. Winfred Leeds -Likely resulting from Lasix use -Will continue home KCl and replete additionally as needed  Depression/Anxiety -I have reviewed this patient in the [place] Controlled Substances Reporting System.  She is receiving medications from only one provider and appears to be taking them as prescribed -Continue Ativan  DM -Continue Levemir -Hold Metformin -Hold AC insulin and cover with SSI   Hypothyroidism -No recent TSH, will order -Continue Synthroid at current dose for now  HFpEF -Nl EF with grade 2 diastolic dysfunction in 74/71 -Will not plan to repeat Echo at this time -Is on appropriate medications (ACE, BB, Lasix) without current evidence of exacerbation  Marijuana abuse -Uses qhs for sleep -Cessation encouraged  OSA -Claustrophobia and so will not wear CPAP  Tobacco dependence -Encourage cessation.  This was discussed with the patient and should be reviewed on an ongoing basis.  -Patch ordered at patient request.  Chronic pain -I have reviewed this patient in the [place] Controlled Substances Reporting System.  -She is not taking chronic opiates.   DVT prophylaxis: Lovenox  Code Status:  Full - confirmed with patient/family Family  Communication: None present  Disposition Plan:  Home once clinically improved Consults called: Cardiology via cardmaster  Admission status: Observation - telemetry   Karmen Bongo MD [facility]  If 7PM-7AM, please contact night-coverage [email or web address] Password [email or web address]  02/15/2016, 10:06 PM

## 2016-02-15 NOTE — ED Notes (Signed)
Attempted report at this time.  Nurse to call back when available. 

## 2016-02-15 NOTE — ED Notes (Signed)
Now rating cp at 7/10.  Last dose of ntg sl given at this time.

## 2016-02-15 NOTE — ED Triage Notes (Signed)
Pt presents with chest pressure starting at 1500, substernal that radiates down left arm, denies any other symptoms, received 324 aspirin and 1 sl nitro

## 2016-02-16 ENCOUNTER — Observation Stay (HOSPITAL_COMMUNITY): Payer: Medicare Other

## 2016-02-16 ENCOUNTER — Observation Stay (HOSPITAL_BASED_OUTPATIENT_CLINIC_OR_DEPARTMENT_OTHER): Payer: Medicare Other

## 2016-02-16 DIAGNOSIS — R51 Headache: Secondary | ICD-10-CM | POA: Diagnosis not present

## 2016-02-16 DIAGNOSIS — Z9861 Coronary angioplasty status: Secondary | ICD-10-CM

## 2016-02-16 DIAGNOSIS — I2510 Atherosclerotic heart disease of native coronary artery without angina pectoris: Secondary | ICD-10-CM

## 2016-02-16 DIAGNOSIS — F418 Other specified anxiety disorders: Secondary | ICD-10-CM

## 2016-02-16 DIAGNOSIS — I5032 Chronic diastolic (congestive) heart failure: Secondary | ICD-10-CM | POA: Diagnosis not present

## 2016-02-16 DIAGNOSIS — G4733 Obstructive sleep apnea (adult) (pediatric): Secondary | ICD-10-CM

## 2016-02-16 DIAGNOSIS — G44209 Tension-type headache, unspecified, not intractable: Secondary | ICD-10-CM | POA: Diagnosis not present

## 2016-02-16 DIAGNOSIS — R0789 Other chest pain: Secondary | ICD-10-CM

## 2016-02-16 DIAGNOSIS — F17200 Nicotine dependence, unspecified, uncomplicated: Secondary | ICD-10-CM

## 2016-02-16 DIAGNOSIS — E039 Hypothyroidism, unspecified: Secondary | ICD-10-CM

## 2016-02-16 DIAGNOSIS — Z955 Presence of coronary angioplasty implant and graft: Secondary | ICD-10-CM | POA: Diagnosis not present

## 2016-02-16 DIAGNOSIS — I110 Hypertensive heart disease with heart failure: Secondary | ICD-10-CM | POA: Diagnosis not present

## 2016-02-16 DIAGNOSIS — E119 Type 2 diabetes mellitus without complications: Secondary | ICD-10-CM

## 2016-02-16 DIAGNOSIS — E876 Hypokalemia: Secondary | ICD-10-CM

## 2016-02-16 DIAGNOSIS — G894 Chronic pain syndrome: Secondary | ICD-10-CM

## 2016-02-16 DIAGNOSIS — I10 Essential (primary) hypertension: Secondary | ICD-10-CM | POA: Diagnosis not present

## 2016-02-16 DIAGNOSIS — R079 Chest pain, unspecified: Secondary | ICD-10-CM

## 2016-02-16 DIAGNOSIS — E669 Obesity, unspecified: Secondary | ICD-10-CM

## 2016-02-16 DIAGNOSIS — Z794 Long term (current) use of insulin: Secondary | ICD-10-CM

## 2016-02-16 HISTORY — PX: NM MYOVIEW LTD: HXRAD82

## 2016-02-16 LAB — NM MYOCAR MULTI W/SPECT W/WALL MOTION / EF: Rest HR: 50 {beats}/min

## 2016-02-16 LAB — BASIC METABOLIC PANEL
ANION GAP: 8 (ref 5–15)
BUN: 7 mg/dL (ref 6–20)
CHLORIDE: 101 mmol/L (ref 101–111)
CO2: 27 mmol/L (ref 22–32)
Calcium: 8.6 mg/dL — ABNORMAL LOW (ref 8.9–10.3)
Creatinine, Ser: 0.64 mg/dL (ref 0.44–1.00)
Glucose, Bld: 222 mg/dL — ABNORMAL HIGH (ref 65–99)
POTASSIUM: 3.7 mmol/L (ref 3.5–5.1)
SODIUM: 136 mmol/L (ref 135–145)

## 2016-02-16 LAB — TROPONIN I
Troponin I: <0.03 ng/mL (ref <0.03)
Troponin I: <0.03 ng/mL (ref <0.03)

## 2016-02-16 LAB — GLUCOSE, CAPILLARY
GLUCOSE-CAPILLARY: 210 mg/dL — AB (ref 65–99)
GLUCOSE-CAPILLARY: 214 mg/dL — AB (ref 65–99)
Glucose-Capillary: 141 mg/dL — ABNORMAL HIGH (ref 65–99)
Glucose-Capillary: 203 mg/dL — ABNORMAL HIGH (ref 65–99)

## 2016-02-16 MED ORDER — TECHNETIUM TC 99M TETROFOSMIN IV KIT
30.0000 | PACK | Freq: Once | INTRAVENOUS | Status: AC | PRN
  Administered 2016-02-16: 30 via INTRAVENOUS

## 2016-02-16 MED ORDER — ONDANSETRON HCL 4 MG/2ML IJ SOLN
INTRAMUSCULAR | Status: AC
  Filled 2016-02-16: qty 2

## 2016-02-16 MED ORDER — TECHNETIUM TC 99M TETROFOSMIN IV KIT
10.0000 | PACK | Freq: Once | INTRAVENOUS | Status: AC | PRN
  Administered 2016-02-16: 10 via INTRAVENOUS

## 2016-02-16 MED ORDER — MORPHINE SULFATE (PF) 2 MG/ML IV SOLN: 1.0000 mg | INTRAVENOUS | Status: DC | PRN

## 2016-02-16 MED ORDER — HYDROCODONE-ACETAMINOPHEN 5-325 MG PO TABS
1.0000 | ORAL_TABLET | Freq: Four times a day (QID) | ORAL | Status: DC | PRN
  Administered 2016-02-16 – 2016-02-17 (×3): 2 via ORAL
  Filled 2016-02-16: qty 2
  Filled 2016-02-16: qty 1
  Filled 2016-02-16 (×2): qty 2

## 2016-02-16 MED ORDER — REGADENOSON 0.4 MG/5ML IV SOLN
INTRAVENOUS | Status: AC
  Administered 2016-02-16: 0.4 mg via INTRAVENOUS
  Filled 2016-02-16: qty 5

## 2016-02-16 MED ORDER — KETOROLAC TROMETHAMINE 30 MG/ML IJ SOLN
30.0000 mg | Freq: Once | INTRAMUSCULAR | Status: AC
  Administered 2016-02-16: 30 mg via INTRAVENOUS
  Filled 2016-02-16: qty 1

## 2016-02-16 MED ORDER — REGADENOSON 0.4 MG/5ML IV SOLN
0.4000 mg | Freq: Once | INTRAVENOUS | Status: AC
  Administered 2016-02-16: 0.4 mg via INTRAVENOUS
  Filled 2016-02-16: qty 5

## 2016-02-16 MED FILL — TRUEPLUS SYR 0.5ML 30GX5/16: 30G X 5/16" | 25 days supply | Qty: 100 | Fill #1

## 2016-02-16 NOTE — Care Management Obs Status (Signed)
[place] [facility] NOTIFICATION   Patient Details  Name: Jacqueline Munoz MRN: [id number] Date of Birth: [birth year]   Medicare Observation Status Notification Given:  Yes    Royston Bake, RN 02/16/2016, 3:59 PM

## 2016-02-16 NOTE — Progress Notes (Signed)
Refused bed alarm. Will continue to monitor patient. 

## 2016-02-16 NOTE — Progress Notes (Signed)
Paged Almyra Deforest cardiology PA regarding pt BP 95/38 and HR sustaining less than 50 bpm. Coreg 3.125 mg held for 1700 dose. Pt asymptomatic and resting in bed.

## 2016-02-16 NOTE — Progress Notes (Signed)
PROGRESS NOTE    Jacqueline Munoz  [id number]  DOB: [birth year]  DOA: 02/15/2016 PCP: [name], MD Outpatient Specialists:   Hospital course: Avreet Cuzzort is a 55 y.o. female with medical history significant of CAD s/p stents in May 2016 as well as HTN, DM, HLD, tobacco dependence presenting with acute onset of chest pain.  Patient reports that she was at the ER last Friday, glucose >400, given IVF, had headache.  Eased off with Percocet but not resolved.  Headache has been persistent since Friday.  This AM, it was hurting really bad and she could feel her heart beating in her head.  About 1400, chest pain started - "my chest felt full, my head felt full."  Has h/o May 2016 2 MIs, 5 stents, also diagnosed with CHF with 1 hospitalization (12/12-12/15/16; EF 0000000, grade 2 diastolic dysfunction).  Weight has been up 16 pounds recently but has been taking Lasix to bring it down.  Substernal chest pressure and a little to the left, also with L arm and back pain radiation.  Non-exertional.  Relieved with NTG.  This pressure feels more like with CHF exacerbation than prior MI, but she is not having significant SOB like last CHF exacerbation.   Assessment & Plan:   Chest pain -Patient with substernal chest pain that came on at rest and was improved with NTG. -2/3 typical symptoms suggestive of atypical chest pain.  -CXR unremarkable.  -Cardiac enzymes negative.  -EKG not indicative of acute ischemia.  -TIMI risk score is 4; which predicts a 14 days risk of death, recurrent MI, or urgent revascularization of 19.9%.  -Will plan to place in observation status on telemetryto rule out ACS by overnight observation.  -Continue ASA 81 mg PO daily and Plavix 75 mg PO daily -morphine given; takes high-dose Lipitor daily and this will be continued -Continue Imdur -Risk factor stratification with FLP and HgbA1c; will also check TSH and UDS -Cardiology consultation in AM - Dr. Allison Quarry group  notified via Inbox message to the [employer]  HTN -Continue Coreg (after stress test), Lisinopril  Hypokalemia -Takes BID KCl but still slightly low in ER -Repleted orally per Dr. Winfred Leeds -Likely resulting from Lasix use -Will continue home KCl and replete additionally as needed  Depression/Anxiety -I have reviewed this patient in the [place] Controlled Substances Reporting System.  She is receiving medications from only one provider and appears to be taking them as prescribed -Continue Ativan  DM -Continue Levemir -Hold Metformin -Hold AC insulin and cover with SSI  Hypothyroidism -High TSH noted. Pt admits that she has not been taking levothyroxine -Restart home levothyroxine 200 mcg daily.    HFpEF -Nl EF with grade 2 diastolic dysfunction in [id number] -Will not plan to repeat Echo at this time -Is on appropriate medications (ACE, BB, Lasix) without current evidence of exacerbation  Marijuana abuse -Uses qhs for sleep -Cessation encouraged  OSA -Claustrophobia and so will not wear CPAP  Tobacco dependence -Encourage cessation.  This was discussed with the patient and should be reviewed on an ongoing basis.  -Patch ordered at patient request.  Chronic pain -I have reviewed this patient in the [place] Controlled Substances Reporting System.  -She is not taking chronic opiates.  Plan to de-escalate narcotics today.   Severe Headaches - Pt says that she is having worse headache she can remember for long time.  Check CT head STAT.     DVT prophylaxis: Lovenox  Code Status:  Full - confirmed with patient/family Family  Communication: None present  Disposition Plan:  Home once clinically improved Consults called: Cardiology via cardmaster  Admission status: Observation - telemetry  Procedures:  Stress testing   Subjective: Pt complaining of severe headache, chest pain resolved now.  No SOB.    Objective: Vitals:   02/16/16 0422 02/16/16 0434 02/16/16 0952  02/16/16 1000  BP:  (!) 158/83 (!) 159/84 128/75  Pulse:  (!) 47    Resp:  18    Temp:  98.4 F (36.9 C)    TempSrc:  Oral    SpO2:  96%    Weight: 84.2 kg (185 lb 9.6 oz)     Height:        Intake/Output Summary (Last 24 hours) at 02/16/16 1054 Last data filed at 02/16/16 0435  Gross per 24 hour  Intake                0 ml  Output              500 ml  Net             -500 ml   Filed Weights   02/15/16 2228 02/16/16 0422  Weight: 83.1 kg (183 lb 3.2 oz) 84.2 kg (185 lb 9.6 oz)    Exam:  General exam: awake, alert, NAD. Cooperative.  Heavy smoker's voice.  Respiratory system: Clear. No increased work of breathing. Cardiovascular system: S1 & S2 heard, RRR. No JVD, murmurs, gallops, clicks or pedal edema. Gastrointestinal system: Abdomen is nondistended, soft and nontender. Normal bowel sounds heard. Central nervous system: Alert and oriented. No focal neurological deficits. Extremities: no cyanosis.  Data Reviewed: Basic Metabolic Panel:  Recent Labs Lab 02/15/16 1900  NA 136  K 3.2*  CL 99*  CO2 28  GLUCOSE 323*  BUN 6  CREATININE 0.71  CALCIUM 8.5*   Liver Function Tests: No results for input(s): AST, ALT, ALKPHOS, BILITOT, PROT, ALBUMIN in the last 168 hours. No results for input(s): LIPASE, AMYLASE in the last 168 hours. No results for input(s): AMMONIA in the last 168 hours. CBC:  Recent Labs Lab 02/15/16 1900  WBC 9.5  HGB 12.7  HCT 37.9  MCV 88.8  PLT 231   Cardiac Enzymes:  Recent Labs Lab 02/16/16 0132 02/16/16 0755  TROPONINI <0.03 <0.03   CBG (last 3)   Recent Labs  02/15/16 2252 02/16/16 0704  GLUCAP 311* 141*   No results found for this or any previous visit (from the past 240 hour(s)).   Studies: Dg Chest 2 View  Result Date: 02/15/2016 CLINICAL DATA:  Central chest pain today. EXAM: CHEST  2 VIEW COMPARISON:  Two-view chest x-ray 11/25/2015. FINDINGS: The heart size is normal. The lungs are clear. The visualized soft  tissues and bony thorax are unremarkable. IMPRESSION: Negative two view chest x-ray Electronically Signed   By: San Morelle M.D.   On: 02/15/2016 19:55     Scheduled Meds: . aspirin EC  81 mg Oral Daily  . atorvastatin  80 mg Oral q1800  . carvedilol  3.125 mg Oral BID WC  . clopidogrel  75 mg Oral Daily  . enoxaparin (LOVENOX) injection  40 mg Subcutaneous Daily  . famotidine  20 mg Oral BID  . FLUoxetine  40 mg Oral Daily  . furosemide  40 mg Oral Daily  . insulin aspart  0-15 Units Subcutaneous TID WC  . insulin detemir  35 Units Subcutaneous Q2200  . isosorbide mononitrate  30 mg Oral Daily  .  levothyroxine  200 mcg Oral QAC breakfast  . lisinopril  10 mg Oral Daily  . LORazepam  1 mg Oral Daily  . nicotine  14 mg Transdermal QHS  . ondansetron      . potassium chloride SA  40 mEq Oral BID   Continuous Infusions:   Principal Problem:   Chest pain Active Problems:   Essential hypertension   Hypothyroid   Anxiety and depression   Hypokalemia   Chronic pain syndrome   Obesity   Cephalalgia   Chronic diastolic heart failure (HCC)   Diabetes mellitus, type 2 (HCC)   CAD S/P percutaneous coronary angioplasty   Mild tetrahydrocannabinol (THC) abuse   Tobacco dependence   OSA (obstructive sleep apnea)   Time spent:   Irwin Brakeman, MD, FAAFP [facility] Pager [phone]  If 7PM-7AM, please contact night-coverage [email or web address] Password [email or web address] 02/16/2016, 10:54 AM    LOS: 0 days

## 2016-02-16 NOTE — Consult Note (Signed)
Reason for Consult:   Chest pain  Requesting Physician: [facility] Primary Cardiologist Dr Ellyn Hack  HPI:   55 y.o.obese, Caucasian female with a PMH of CAD. She had-PCI in setting of NSTEMI May 2016. She had LAD DES, OM1 DES, and CFX DES x 2 11/16/14. She has a residual CTO of her RCA with collaterals from the CFX.  In June 2016 she was cathed again and had a new LAD stenosis proximal to the previously placed LAD stent. This was treated with an overlapping (3d) LAD DES. She had a negative Myoview in Oct 2016. She has normal LVF but was admitted in Dec 1884 with diastolic CHF (grade 2 DD). Her LOV with Dr Ellyn Hack was 08/24/15. She was seen in the ED last week with a BS> 400. She says she has had a headache since then. Yesterday afternoon she developed SSCP that radiated to her Lt arm and back. This was associated with nausea but no vomiting or diaphoresis. It improved with NTG in ED (she does not have NTG at home).  I saw the pt this am when she was in Nuclear Medicine for [place].   PMHx:  Past Medical History:  Diagnosis Date  . Arthritis   . CAD S/P percutaneous coronary angioplasty    a. NSTEMI: s/p DES x2 LAD and DES to bifurcation of OM1 and mid to distal LCx (11/16/14) b. UA/ACS:  overlapping DES x1 to LAD for de novo 99% focal lesion in LAD (12/02/2014)  . Chronic back pain   . Chronic depression   . Coronary artery chronic total occlusion: 100% RCA CTO 11/16/2014   a. Prox RCA to Mid RCA lesion, 100% stenosed.   . Diabetes mellitus, type 2 ([place])   . Essential hypertension   . Fibromyalgia   . History of stomach ulcers   . History of tobacco abuse   . HLD (hyperlipidemia)   . Hypothyroid   . Kidney stones   . OSA (obstructive sleep apnea)    a. does not wear CPAP  . Type IVa MI - Peri-PCI 11/17/2014   Prolonged ischemia during bifurcation PCI of  m-d Cx-OM1 with minicrush technique    Past Surgical History:  Procedure Laterality Date  . CARDIAC CATHETERIZATION  N/A 11/16/2014   Procedure: Left Heart Cath and Coronary Angiography;  Surgeon: Peter M Martinique, MD;  Location: [facility];  Service: Cardiovascular;  RCA 100% CTO, m-dLAD 90%, m-dCx 95%, OM1 90%  . CARDIAC CATHETERIZATION  11/16/2014   Procedure: Coronary Stent Intervention;  Surgeon: Peter M Martinique, MD;  Location: [facility];  Service: Cardiovascular;;m-d LAD overlapping 3.0 x 16 & 2.75 x 16 Promus P DES; OM1-dCx Minicrush bifurcation (dCx 2.25 x 38 Synergy DES crushed with 2.75 x 24 Synergy DES in OM1)   . CARDIAC CATHETERIZATION N/A 12/02/2014   Procedure: Left Heart Cath and Coronary Angiography;  Surgeon: Leonie Man, MD;  Location: [facility];  Service: Cardiovascular;  Previous Stents in LAD & Cx-OM patent, mLAD prox to stents 99%  . CARDIAC CATHETERIZATION N/A 12/02/2014   Procedure: Coronary Stent Intervention;  Surgeon: Leonie Man, MD;  Location: [facility];  Service: Cardiovascular;  3rd overlapping (prox) mLAD Promus Premier DES 3.0 x 20 (3.25 mm)  . CESAREAN SECTION  2000  . INNER EAR SURGERY Bilateral "several"  . TONSILLECTOMY    . TOTAL ABDOMINAL HYSTERECTOMY  ~ 2008  . TRANSTHORACIC ECHOCARDIOGRAM  5/19 &  6/ 2016   a) EF 65-70%, mild LVH, Gr 1 DD; b) EF 60% -ron RWMA, no valve lesions  . TUBAL LIGATION  2000    SOCHx:  reports that she quit smoking about 15 months ago. Her smoking use included Cigarettes. She has a 58.50 pack-year smoking history. She has never used smokeless tobacco. She reports that she uses drugs, including Marijuana. She reports that she does not drink alcohol.  FAMHx: Family History  Problem Relation Age of Onset  . Hyperlipidemia Mother   . Sudden death Father   . Emphysema Maternal Grandmother     smoked    ALLERGIES: Allergies  Allergen Reactions  . Corticosteroids Anaphylaxis    ALL steroids.     Roselee Nova [Pregabalin] Shortness Of Breath and Swelling  . Penicillins Anaphylaxis    Has patient had a PCN  reaction causing immediate rash, facial/tongue/throat swelling, SOB or lightheadedness with hypotension: {Yes Has patient had a PCN reaction causing severe rash involving mucus membranes or skin necrosis: NO Has patient had a PCN reaction that required hospitalization NO Has patient had a PCN reaction occurring within the last 10 years: {Yes If all of the above answers are "NO", then may proceed with Cephalosporin use.   . Amitriptyline Other (See Comments)    Muscle Cramps  . Gabapentin Swelling    ROS: Review of Systems: General: negative for chills, fever, night sweats or weight changes.  Cardiovascular: negative for dyspnea on exertion, edema, orthopnea, palpitations, paroxysmal nocturnal dyspnea or shortness of breath HEENT: negative for any visual disturbances, blindness, glaucoma Dermatological: negative for rash Respiratory: negative for cough, hemoptysis, or wheezing Urologic: negative for hematuria or dysuria Abdominal: negative for nausea, vomiting, diarrhea, bright red blood per rectum, melena, or hematemesis Neurologic: negative for visual changes, syncope, or dizziness Musculoskeletal: negative for back pain, joint pain, or swelling Psych: cooperative and appropriate All other systems reviewed and are otherwise negative except as noted above.   HOME MEDICATIONS: Prior to Admission medications   Medication Sig Start Date End Date Taking? Authorizing Provider  acetaminophen (TYLENOL) 500 MG chewable tablet Chew 1,000 mg by mouth every 6 (six) hours as needed for pain.   Yes Historical Provider, MD  aspirin EC 81 MG EC tablet Take 1 tablet (81 mg total) by mouth daily. 11/18/14  Yes Calvert Cantor, MD  atorvastatin (LIPITOR) 40 MG tablet TAKE 2 TABLETS BY MOUTH DAILY AT 6 PM. 08/25/15  Yes Josalyn Funches, MD  carvedilol (COREG) 3.125 MG tablet TAKE 1 TABLET BY MOUTH 2 TIMES DAILY WITH A MEAL. 02/09/16  Yes Josalyn Funches, MD  clopidogrel (PLAVIX) 75 MG tablet Take 1 tablet  (75 mg total) by mouth daily. 01/05/15  Yes Josalyn Funches, MD  FLUoxetine (PROZAC) 20 MG capsule Take 40 mg by mouth daily.    Yes Historical Provider, MD  furosemide (LASIX) 40 MG tablet Take 1 tablet (40 mg total) by mouth daily. 06/15/15  Yes Janetta Hora, PA-C  glucose monitoring kit (FREESTYLE) monitoring kit 1 each by Does not apply route 4 (four) times daily - after meals and at bedtime. 1 month Diabetic Testing Supplies for QAC-QHS accuchecks. 11/16/14  Yes Calvert Cantor, MD  insulin aspart (NOVOLOG) 100 UNIT/ML injection Inject 0-10 Units into the skin 3 (three) times daily as needed for high blood sugar (as needed for blood sugar coverage-sliding scale).   Yes Historical Provider, MD  Insulin Detemir (LEVEMIR) 100 UNIT/ML Pen INJECT 35 UNITS INTO THE SKIN AT BEDTIME. 06/27/15  Yes  Jaclyn Shaggy, MD  Insulin Syringe-Needle U-100 (TRUEPLUS INSULIN SYRINGE) 30G X 5/16" 0.5 ML MISC 1 each by Does not apply route 4 (four) times daily. 03/02/15  Yes Josalyn Funches, MD  isosorbide mononitrate (IMDUR) 30 MG 24 hr tablet TAKE 1 TABLET BY MOUTH DAILY. 10/03/15  Yes Marykay Lex, MD  levothyroxine (SYNTHROID, LEVOTHROID) 200 MCG tablet Take 1 tablet (200 mcg total) by mouth daily before breakfast. 06/15/15  Yes Janetta Hora, PA-C  lisinopril (PRINIVIL,ZESTRIL) 10 MG tablet Take 10 mg by mouth daily.   Yes Historical Provider, MD  LORazepam (ATIVAN) 1 MG tablet Take 1 mg by mouth daily.   Yes Historical Provider, MD  metFORMIN (GLUCOPHAGE) 500 MG tablet TAKE 1 TABLET BY MOUTH 2 TIMES DAILY WITH A MEAL. 02/09/16  Yes Josalyn Funches, MD  nitroGLYCERIN (NITROSTAT) 0.4 MG SL tablet Place 0.4 mg under the tongue every 5 (five) minutes as needed for chest pain (may take three doses by mouth per day as needed).   Yes Historical Provider, MD  ondansetron (ZOFRAN ODT) 8 MG disintegrating tablet Take 1 tablet (8 mg total) by mouth every 8 (eight) hours as needed for nausea or vomiting. 12/08/15  Yes Josalyn  Funches, MD  potassium chloride 20 MEQ TBCR Take 40 mEq by mouth 2 (two) times daily. 02/09/16 02/15/16 Yes Shari Upstill, PA-C  ranitidine (ZANTAC) 150 MG tablet Take 1 tablet (150 mg total) by mouth 2 (two) times daily. 12/08/15  Yes Dessa Phi, MD    HOSPITAL MEDICATIONS: I have reviewed the patient's current medications.  VITALS: Blood pressure 128/75, pulse (!) 47, temperature 98.4 F (36.9 C), temperature source Oral, resp. rate 18, height 5\' 2"  (1.575 m), weight 185 lb 9.6 oz (84.2 kg), SpO2 96 %.  PHYSICAL EXAM: General appearance: alert, cooperative, no distress and moderately obese Neck: no carotid bruit and no JVD Lungs: clear to auscultation bilaterally Heart: regular rate and rhythm Abdomen: obese, non tender Extremities: extremities normal, atraumatic, no cyanosis or edema Pulses: 2+ and symmetric Skin: Skin color, texture, turgor normal. No rashes or lesions or multiple tatoos Neurologic: Grossly normal  LABS: Results for orders placed or performed during the hospital encounter of 02/15/16 (from the past 24 hour(s))  Basic metabolic panel     Status: Abnormal   Collection Time: 02/15/16  7:00 PM  Result Value Ref Range   Sodium 136 135 - 145 mmol/L   Potassium 3.2 (L) 3.5 - 5.1 mmol/L   Chloride 99 (L) 101 - 111 mmol/L   CO2 28 22 - 32 mmol/L   Glucose, Bld 323 (H) 65 - 99 mg/dL   BUN 6 6 - 20 mg/dL   Creatinine, Ser 02/17/16 0.44 - 1.00 mg/dL   Calcium 8.5 (L) 8.9 - 10.3 mg/dL   GFR calc non Af Amer >60 >60 mL/min   GFR calc Af Amer >60 >60 mL/min   Anion gap 9 5 - 15  CBC     Status: None   Collection Time: 02/15/16  7:00 PM  Result Value Ref Range   WBC 9.5 4.0 - 10.5 K/uL   RBC 4.27 3.87 - 5.11 MIL/uL   Hemoglobin 12.7 12.0 - 15.0 g/dL   HCT 02/17/16 46.8 - 39.7 %   MCV 88.8 78.0 - 100.0 fL   MCH 29.7 26.0 - 34.0 pg   MCHC 33.5 30.0 - 36.0 g/dL   RDW 27.9 82.2 - 33.1 %   Platelets 231 150 - 400 K/uL  Urine rapid drug screen (hosp  performed)     Status:  Abnormal   Collection Time: 02/15/16  7:20 PM  Result Value Ref Range   Opiates NONE DETECTED NONE DETECTED   Cocaine NONE DETECTED NONE DETECTED   Benzodiazepines NONE DETECTED NONE DETECTED   Amphetamines NONE DETECTED NONE DETECTED   Tetrahydrocannabinol POSITIVE (A) NONE DETECTED   Barbiturates NONE DETECTED NONE DETECTED  I-stat troponin, ED     Status: None   Collection Time: 02/15/16  7:37 PM  Result Value Ref Range   Troponin i, poc 0.01 0.00 - 0.08 ng/mL   Comment 3          TSH     Status: Abnormal   Collection Time: 02/15/16 10:07 PM  Result Value Ref Range   TSH 64.507 (H) 0.350 - 4.500 uIU/mL  Lipid panel     Status: Abnormal   Collection Time: 02/15/16 10:07 PM  Result Value Ref Range   Cholesterol 213 (H) 0 - 200 mg/dL   Triglycerides 233 (H) <150 mg/dL   HDL 49 >40 mg/dL   Total CHOL/HDL Ratio 4.3 RATIO   VLDL 47 (H) 0 - 40 mg/dL   LDL Cholesterol 117 (H) 0 - 99 mg/dL  Glucose, capillary     Status: Abnormal   Collection Time: 02/15/16 10:52 PM  Result Value Ref Range   Glucose-Capillary 311 (H) 65 - 99 mg/dL   Comment 1 Notify RN    Comment 2 Document in Chart   Troponin I-serum (0, 3, 6 hours)     Status: None   Collection Time: 02/16/16  1:32 AM  Result Value Ref Range   Troponin I <0.03 <0.03 ng/mL  Glucose, capillary     Status: Abnormal   Collection Time: 02/16/16  7:04 AM  Result Value Ref Range   Glucose-Capillary 141 (H) 65 - 99 mg/dL  Troponin I-serum (0, 3, 6 hours)     Status: None   Collection Time: 02/16/16  7:55 AM  Result Value Ref Range   Troponin I <0.03 <0.03 ng/mL    EKG: NSR, SB 52  IMAGING: Dg Chest 2 View  Result Date: 02/15/2016 CLINICAL DATA:  Central chest pain today. EXAM: CHEST  2 VIEW COMPARISON:  Two-view chest x-ray 11/25/2015. FINDINGS: The heart size is normal. The lungs are clear. The visualized soft tissues and bony thorax are unremarkable. IMPRESSION: Negative two view chest x-ray Electronically Signed   By:  San Morelle M.D.   On: 02/15/2016 19:55    IMPRESSION: Principal Problem:   Chest pain Active Problems:   Essential hypertension   Hypothyroid   Anxiety and depression   Hypokalemia   Chronic pain syndrome   Obesity   Cephalalgia   Chronic diastolic heart failure (HCC)   Diabetes mellitus, type 2 (HCC)   CAD S/P percutaneous coronary angioplasty   Mild tetrahydrocannabinol (THC) abuse   Tobacco dependence   OSA (obstructive sleep apnea)   RECOMMENDATION: Lexiscan Myoview done this am- final report is pending  Time Spent Directly with Patient: 45 minutes  [place], [place] beeper 02/16/2016, 11:08 AM

## 2016-02-17 ENCOUNTER — Encounter (HOSPITAL_COMMUNITY): Payer: Self-pay | Admitting: *Deleted

## 2016-02-17 DIAGNOSIS — F1210 Cannabis abuse, uncomplicated: Secondary | ICD-10-CM

## 2016-02-17 DIAGNOSIS — I2510 Atherosclerotic heart disease of native coronary artery without angina pectoris: Secondary | ICD-10-CM | POA: Diagnosis not present

## 2016-02-17 DIAGNOSIS — G44209 Tension-type headache, unspecified, not intractable: Secondary | ICD-10-CM | POA: Diagnosis not present

## 2016-02-17 DIAGNOSIS — R079 Chest pain, unspecified: Secondary | ICD-10-CM | POA: Diagnosis not present

## 2016-02-17 DIAGNOSIS — F418 Other specified anxiety disorders: Secondary | ICD-10-CM | POA: Diagnosis not present

## 2016-02-17 LAB — HEMOGLOBIN A1C
Hgb A1c MFr Bld: 10.8 % — ABNORMAL HIGH (ref 4.8–5.6)
Mean Plasma Glucose: 263 mg/dL

## 2016-02-17 LAB — GLUCOSE, CAPILLARY: GLUCOSE-CAPILLARY: 113 mg/dL — AB (ref 65–99)

## 2016-02-17 MED ORDER — NICOTINE 14 MG/24HR TD PT24: 14.0000 mg | MEDICATED_PATCH | Freq: Every day | TRANSDERMAL | 0 refills | Status: DC

## 2016-02-17 NOTE — Discharge Instructions (Signed)
Chest Pain Observation °It is often hard to give a specific diagnosis for the cause of chest pain. Among other possibilities your symptoms might be caused by inadequate oxygen delivery to your heart (angina). Angina that is not treated or evaluated can lead to a heart attack (myocardial infarction) or death. °Blood tests, electrocardiograms, and X-rays may have been done to help determine a possible cause of your chest pain. After evaluation and observation, your health care provider has determined that it is unlikely your pain was caused by an unstable condition that requires hospitalization. However, a full evaluation of your pain may need to be completed, with additional diagnostic testing as directed. It is very important to keep your follow-up appointments. Not keeping your follow-up appointments could result in permanent heart damage, disability, or death. If there is any problem keeping your follow-up appointments, you must call your health care provider. °HOME CARE INSTRUCTIONS  °Due to the slight chance that your pain could be angina, it is important to follow your health care provider's treatment plan and also maintain a healthy lifestyle: °· Maintain or work toward achieving a healthy weight. °· Stay physically active and exercise regularly. °· Decrease your salt intake. °· Eat a balanced, healthy diet. Talk to a dietitian to learn about heart-healthy foods. °· Increase your fiber intake by including whole grains, vegetables, fruits, and nuts in your diet. °· Avoid situations that cause stress, anger, or depression. °· Take medicines as advised by your health care provider. Report any side effects to your health care provider. Do not stop medicines or adjust the dosages on your own. °· Quit smoking. Do not use nicotine patches or gum until you check with your health care provider. °· Keep your blood pressure, blood sugar, and cholesterol levels within normal limits. °· Limit alcohol intake to no more than  1 drink per day for women who are not pregnant and 2 drinks per day for men. °· Do not abuse drugs. °SEEK IMMEDIATE MEDICAL CARE IF: °You have severe chest pain or pressure which may include symptoms such as: °· You feel pain or pressure in your arms, neck, jaw, or back. °· You have severe back or abdominal pain, feel sick to your stomach (nauseous), or throw up (vomit). °· You are sweating profusely. °· You are having a fast or irregular heartbeat. °· You feel short of breath while at rest. °· You notice increasing shortness of breath during rest, sleep, or with activity. °· You have chest pain that does not get better after rest or after taking your usual medicine. °· You wake from sleep with chest pain. °· You are unable to sleep because you cannot breathe. °· You develop a frequent cough or you are coughing up blood. °· You feel dizzy, faint, or experience extreme fatigue. °· You develop severe weakness, dizziness, fainting, or chills. °Any of these symptoms may represent a serious problem that is an emergency. Do not wait to see if the symptoms will go away. Call your local emergency services (911 in the [place] Do not drive yourself to the hospital. °MAKE SURE YOU: °· Understand these instructions. °· Will watch your condition. °· Will get help right away if you are not doing well or get worse. °  °This information is not intended to replace advice given to you by your health care provider. Make sure you discuss any questions you have with your health care provider. °  °Document Released: 07/20/2010 Document Revised: 06/22/2013 Document Reviewed: 12/17/2012 °Elsevier Interactive Patient   Education 2016 [employer]   Chest Wall Pain Chest wall pain is pain in or around the bones and muscles of your chest. Sometimes, an injury causes this pain. Sometimes, the cause may not be known. This pain may take several weeks or longer to get better. HOME CARE Pay attention to any changes in your symptoms. Take  these actions to help with your pain:  Rest as told by your doctor.  Avoid activities that cause pain. Try not to use your chest, belly (abdominal), or side muscles to lift heavy things.  If directed, apply ice to the painful area:  Put ice in a plastic bag.  Place a towel between your skin and the bag.  Leave the ice on for 20 minutes, 2-3 times per day.  Take over-the-counter and prescription medicines only as told by your doctor.  Do not use tobacco products, including cigarettes, chewing tobacco, and e-cigarettes. If you need help quitting, ask your doctor.  Keep all follow-up visits as told by your doctor. This is important. GET HELP IF:  You have a fever.  Your chest pain gets worse.  You have new symptoms. GET HELP RIGHT AWAY IF:  You feel sick to your stomach (nauseous) or you throw up (vomit).  You feel sweaty or light-headed.  You have a cough with phlegm (sputum) or you cough up blood.  You are short of breath.   This information is not intended to replace advice given to you by your health care provider. Make sure you discuss any questions you have with your health care provider.   Document Released: 12/04/2007 Document Revised: 03/08/2015 Document Reviewed: 09/12/2014 Elsevier Interactive Patient Education 2016 [employer]   Sinus Headache A sinus headache happens when your sinuses become clogged or swollen. You may feel pain or pressure in your face, forehead, ears, or upper teeth. Sinus headaches can be mild or severe. HOME CARE  Take medicines only as told by your doctor.  If you were given an antibiotic medicine, finish all of it even if you start to feel better.  Use a nose spray if you feel stuffed up (congested).  If told, apply a warm, moist washcloth to your face to help lessen pain. GET HELP IF:  You get headaches more than one time each week.  Light or sound bothers you.  You have a fever.  You feel sick to your stomach  (nauseous) or you throw up (vomit).  Your headaches do not get better with treatment. GET HELP RIGHT AWAY IF:  You have trouble seeing.  You suddenly have very bad pain in your face or head.  You start to twitch or shake (seizure).  You are confused.  You have a stiff neck.   This information is not intended to replace advice given to you by your health care provider. Make sure you discuss any questions you have with your health care provider.   Document Released: 10/17/2010 Document Revised: 11/01/2014 Document Reviewed: 06/13/2014 Elsevier Interactive Patient Education 2016 [employer]. Analgesic Rebound Headaches An analgesic rebound headache is a headache that returns after pain medicine (analgesic) that was taken to treat the initial headache wears off. People who suffer from tension, migraine, or cluster headaches are at risk for developing rebound headaches. Any type of primary headache can return as a rebound headache if you regularly take analgesics more than three times a week. If the cycle of rebound headaches continues, they become chronic daily headaches.  CAUSES Analgesics frequently associated with this  problem include common over-the-counter medicines like aspirin, ibuprofen, acetaminophen, sinus relief medicines, and other medicines that contain caffeine. Narcotic pain medicines are also a common cause of rebound headaches.  SIGNS AND SYMPTOMS The symptoms of rebound headaches are the same as the symptoms of your initial headache. Symptoms of specific types of headaches include: Tension headache  Pressure around the head.  Dull, aching head pain.  Pain felt over the front and sides of the head.  Tenderness in the muscles of the head, neck and shoulders. Migraine Headache  Pulsing or throbbing pain on one or both sides of the head.  Severe pain that interferes with daily activities.  Pain that is worsened by physical activity.  Nausea, vomiting, or  both.  Pain with exposure to bright light, loud noises, or strong smells.  General sensitivity to bright light, loud noises, or strong smells.  Visual changes.  Numbness of one or both arms. Cluster Headaches  Severe pain that begins in or around one eye or temple.  Redness in the eye on the same side as the pain.  Droopy or swollen eyelid.  One-sided head pain.  Nausea.  Runny nose.  Sweaty, pale facial skin.  Restlessness. DIAGNOSIS  Analgesic rebound headaches are diagnosed by reviewing your medical history. This includes the nature of your initial headaches, as well as the type of pain medicines you have been using to treat your headaches and how often you take them. TREATMENT Discontinuing frequent use of the analgesic medicine will typically reduce the frequency of the rebound episodes. This may initially worsen your headaches but eventually the pain should become more manageable, less frequent, and less severe.  Seeing a headache specialists may helpful. He or she may be able to help you manage your headaches and to make sure there is not another cause of the headaches. Alternative methods of stress relief such as acupuncture, counseling, biofeedback, and massage may also be helpful. Talk with your health care provider about which alternative treatments might be good for you. HOME CARE INSTRUCTIONS Stopping the regular use of pain medicine can be difficult. Follow your health care provider's instructions carefully. Keep all of your appointments. Avoid triggers that are known to cause your primary headaches. SEEK MEDICAL CARE IF: You continue to experience headaches after following your health care provider's recommended treatments. SEEK IMMEDIATE MEDICAL CARE IF:  You develop new headache pain.  You develop headache pain that is different than what you have experienced in the past.  You develop numbness or tingling in your arms or legs.  You develop changes in your  speech or vision. MAKE SURE YOU:  Understand these instructions.  Will watch your child's condition.  Will get help right away if your child is not doing well or gets worse.   This information is not intended to replace advice given to you by your health care provider. Make sure you discuss any questions you have with your health care provider.   Document Released: 09/07/2003 Document Revised: 07/08/2014 Document Reviewed: 12/31/2012 Elsevier Interactive Patient Education 2016 [employer].  Hypothyroidism Hypothyroidism is a disorder of the thyroid. The thyroid is a large gland that is located in the lower front of the neck. The thyroid releases hormones that control how the body works. With hypothyroidism, the thyroid does not make enough of these hormones. CAUSES Causes of hypothyroidism may include:  Viral infections.  Pregnancy.  Your own defense system (immune system) attacking your thyroid.  Certain medicines.  Birth defects.  Past radiation  treatments to your head or neck.  Past treatment with radioactive iodine.  Past surgical removal of part or all of your thyroid.  Problems with the gland that is located in the center of your brain (pituitary). SIGNS AND SYMPTOMS Signs and symptoms of hypothyroidism may include:  Feeling as though you have no energy (lethargy).  Inability to tolerate cold.  Weight gain that is not explained by a change in diet or exercise habits.  Dry skin.  Coarse hair.  Menstrual irregularity.  Slowing of thought processes.  Constipation.  Sadness or depression. DIAGNOSIS  Your health care provider may diagnose hypothyroidism with blood tests and ultrasound tests. TREATMENT Hypothyroidism is treated with medicine that replaces the hormones that your body does not make. After you begin treatment, it may take several weeks for symptoms to go away. HOME CARE INSTRUCTIONS   Take medicines only as directed by your health care  provider.  If you start taking any new medicines, tell your health care provider.  Keep all follow-up visits as directed by your health care provider. This is important. As your condition improves, your dosage needs may change. You will need to have blood tests regularly so that your health care provider can watch your condition. SEEK MEDICAL CARE IF:  Your symptoms do not get better with treatment.  You are taking thyroid replacement medicine and:  You sweat excessively.  You have tremors.  You feel anxious.  You lose weight rapidly.  You cannot tolerate heat.  You have emotional swings.  You have diarrhea.  You feel weak. SEEK IMMEDIATE MEDICAL CARE IF:   You develop chest pain.  You develop an irregular heartbeat.  You develop a rapid heartbeat.   This information is not intended to replace advice given to you by your health care provider. Make sure you discuss any questions you have with your health care provider.   Document Released: 06/17/2005 Document Revised: 07/08/2014 Document Reviewed: 11/02/2013 Elsevier Interactive Patient Education 2016 Else[employer] Inc.   Tobacco Use Disorder Tobacco use disorder (TUD) is a mental disorder. It is the long-term use of tobacco in spite of related health problems or difficulty with normal life activities. Tobacco is most commonly smoked as cigarettes and less commonly as cigars or pipes. Smokeless chewing tobacco and snuff are also popular. People with TUD get a feeling of extreme pleasure (euphoria) from using tobacco and have a desire to use it again and again. Repeated use of tobacco can cause problems. The addictive effects of tobacco are due mainly tothe ingredient nicotine. Nicotine also causes a rush of adrenaline (epinephrine) in the body. This leads to increased blood pressure, heart rate, and breathing rate. These changes may cause problems for people with high blood pressure, weak hearts, or lung disease. High doses of  nicotine in children and pets can lead to seizures and death.  Tobacco contains a number of other unsafe chemicals. These chemicals are especially harmful when inhaled as smoke and can damage almost every organ in the body. Smokers live shorter lives than nonsmokers and are at risk of dying from a number of diseases and cancers. Tobacco smoke can also cause health problems for nonsmokers (due to inhaling secondhand smoke). Smoking is also a fire hazard.  TUD usually starts in the late teenage years and is most common in young adults between the ages of 36 and 47 years. People who start smoking earlier in life are more likely to continue smoking as adults. TUD is somewhat more common in  men than women. People with TUD are at higher risk for using alcohol and other drugs of abuse. RISK FACTORS Risk factors for TUD include:   Having family members with the disorder.  Being around people who use tobacco.  Having an existing mental health issue such as schizophrenia, depression, bipolar disorder, ADHD, or posttraumatic stress disorder (PTSD). SIGNS AND SYMPTOMS  People with tobacco use disorder have two or more of the following signs and symptoms within 12 months:   Use of more tobacco over a longer period than intended.   Not able to cut down or control tobacco use.   A lot of time spent obtaining or using tobacco.   Strong desire or urge to use tobacco (craving). Cravings may last for 6 months or longer after quitting.  Use of tobacco even when use leads to major problems at work, school, or home.   Use of tobacco even when use leads to relationship problems.   Giving up or cutting down on important life activities because of tobacco use.   Repeatedly using tobacco in situations where it puts you or others in physical danger, like smoking in bed.   Use of tobacco even when it is known that a physical or mental problem is likely related to tobacco use.   Physical problems are  numerous and may include chronic bronchitis, emphysema, lung and other cancers, gum disease, high blood pressure, heart disease, and stroke.   Mental problems caused by tobacco may include difficulty sleeping and anxiety.  Need to use greater amounts of tobacco to get the same effect. This means you have developed a tolerance.   Withdrawal symptoms as a result of stopping or rapidly cutting back use. These symptoms may last a month or more after quitting and include the following:   Depressed, anxious, or irritable mood.   Difficulty concentrating.   Increased appetite.  Restlessness or trouble sleeping.   Use of tobacco to avoid withdrawal symptoms. DIAGNOSIS  Tobacco use disorder is diagnosed by your health care provider. A diagnosis may be made by:  Your health care provider asking questions about your tobacco use and any problems it may be causing.  A physical exam.  Lab tests.  You may be referred to a mental health professional or addiction specialist. The severity of tobacco use disorder depends on the number of signs and symptoms you have:   Mild--Two or three symptoms.  Moderate--Four or five symptoms.   Severe--Six or more symptoms.  TREATMENT  Many people with tobacco use disorder are unable to quit on their own and need help. Treatment options include the following:  Nicotine replacement therapy (NRT). NRT provides nicotine without the other harmful chemicals in tobacco. NRT gradually lowers the dosage of nicotine in the body and reduces withdrawal symptoms. NRT is available in over-the-counter forms (gum, lozenges, and skin patches) as well as prescription forms (mouth inhaler and nasal spray).  Medicines.This may include:  Antidepressant medicine that may reduce nicotine cravings.  A medicine that acts on nicotine receptors in the brain to reduce cravings and withdrawal symptoms. It may also block the effects of tobacco in people with TUD who  relapse.  Counseling or talk therapy. A form of talk therapy called behavioral therapy is commonly used to treat people with TUD. Behavioral therapy looks at triggers for tobacco use, how to avoid them, and how to cope with cravings. It is most effective in person or by phone but is also available in self-help forms (books and  Internet websites).  Support groups. These provide emotional support, advice, and guidance for quitting tobacco. The most effective treatment for TUD is usually a combination of medicine, talk therapy, and support groups. HOME CARE INSTRUCTIONS  Keep all follow-up visits as directed by your health care provider. This is important.  Take medicines only as directed by your health care provider.  Check with your health care provider before starting new prescription or over-the-counter medicines. SEEK MEDICAL CARE IF:  You are not able to take your medicines as prescribed.  Treatment is not helping your TUD and your symptoms get worse. SEEK IMMEDIATE MEDICAL CARE IF:  You have serious thoughts about hurting yourself or others.  You have trouble breathing, chest pain, sudden weakness, or sudden numbness in part of your body.   This information is not intended to replace advice given to you by your health care provider. Make sure you discuss any questions you have with your health care provider.   Document Released: 02/21/2004 Document Revised: 07/08/2014 Document Reviewed: 08/13/2013 Elsevier Interactive Patient Education 2016 [employer].  Steps to Quit Smoking  Smoking tobacco can be harmful to your health and can affect almost every organ in your body. Smoking puts you, and those around you, at risk for developing many serious chronic diseases. Quitting smoking is difficult, but it is one of the best things that you can do for your health. It is never too late to quit. WHAT ARE THE BENEFITS OF QUITTING SMOKING? When you quit smoking, you lower your risk of  developing serious diseases and conditions, such as:  Lung cancer or lung disease, such as COPD.  Heart disease.  Stroke.  Heart attack.  Infertility.  Osteoporosis and bone fractures. Additionally, symptoms such as coughing, wheezing, and shortness of breath may get better when you quit. You may also find that you get sick less often because your body is stronger at fighting off colds and infections. If you are pregnant, quitting smoking can help to reduce your chances of having a baby of low birth weight. HOW DO I GET READY TO QUIT? When you decide to quit smoking, create a plan to make sure that you are successful. Before you quit:  Pick a date to quit. Set a date within the next two weeks to give you time to prepare.  Write down the reasons why you are quitting. Keep this list in places where you will see it often, such as on your bathroom mirror or in your car or wallet.  Identify the people, places, things, and activities that make you want to smoke (triggers) and avoid them. Make sure to take these actions:  Throw away all cigarettes at home, at work, and in your car.  Throw away smoking accessories, such as Scientist, research (medical).  Clean your car and make sure to empty the ashtray.  Clean your home, including curtains and carpets.  Tell your family, friends, and coworkers that you are quitting. Support from your loved ones can make quitting easier.  Talk with your health care provider about your options for quitting smoking.  Find out what treatment options are covered by your health insurance. WHAT STRATEGIES CAN I USE TO QUIT SMOKING?  Talk with your healthcare provider about different strategies to quit smoking. Some strategies include:  Quitting smoking altogether instead of gradually lessening how much you smoke over a period of time. Research shows that quitting "cold [place]" is more successful than gradually quitting.  Attending in-person counseling to help you  build Occupational hygienist. You are more likely to have success in quitting if you attend several counseling sessions. Even short sessions of 10 minutes can be effective.  Finding resources and support systems that can help you to quit smoking and remain smoke-free after you quit. These resources are most helpful when you use them often. They can include:  Online chats with a Social worker.  Telephone quitlines.  Printed Furniture conservator/restorer.  Support groups or group counseling.  Text messaging programs.  Mobile phone applications.  Taking medicines to help you quit smoking. (If you are pregnant or breastfeeding, talk with your health care provider first.) Some medicines contain nicotine and some do not. Both types of medicines help with cravings, but the medicines that include nicotine help to relieve withdrawal symptoms. Your health care provider may recommend:  Nicotine patches, gum, or lozenges.  Nicotine inhalers or sprays.  Non-nicotine medicine that is taken by mouth. Talk with your health care provider about combining strategies, such as taking medicines while you are also receiving in-person counseling. Using these two strategies together makes you more likely to succeed in quitting than if you used either strategy on its own. If you are pregnant or breastfeeding, talk with your health care provider about finding counseling or other support strategies to quit smoking. Do not take medicine to help you quit smoking unless told to do so by your health care provider. WHAT THINGS CAN I DO TO MAKE IT EASIER TO QUIT? Quitting smoking might feel overwhelming at first, but there is a lot that you can do to make it easier. Take these important actions:  Reach out to your family and friends and ask that they support and encourage you during this time. Call telephone quitlines, reach out to support groups, or work with a counselor for support.  Ask people who smoke to avoid smoking around  you.  Avoid places that trigger you to smoke, such as bars, parties, or smoke-break areas at work.  Spend time around people who do not smoke.  Lessen stress in your life, because stress can be a smoking trigger for some people. To lessen stress, try:  Exercising regularly.  Deep-breathing exercises.  Yoga.  Meditating.  Performing a body scan. This involves closing your eyes, scanning your body from head to toe, and noticing which parts of your body are particularly tense. Purposefully relax the muscles in those areas.  Download or purchase mobile phone or tablet apps (applications) that can help you stick to your quit plan by providing reminders, tips, and encouragement. There are many free apps, such as QuitGuide from the [employer] Office manager for Disease Control and Prevention). You can find other support for quitting smoking (smoking cessation) through smokefree.gov and other websites. HOW WILL I FEEL WHEN I QUIT SMOKING? Within the first 24 hours of quitting smoking, you may start to feel some withdrawal symptoms. These symptoms are usually most noticeable 2-3 days after quitting, but they usually do not last beyond 2-3 weeks. Changes or symptoms that you might experience include:  Mood swings.  Restlessness, anxiety, or irritation.  Difficulty concentrating.  Dizziness.  Strong cravings for sugary foods in addition to nicotine.  Mild weight gain.  Constipation.  Nausea.  Coughing or a sore throat.  Changes in how your medicines work in your body.  A depressed mood.  Difficulty sleeping (insomnia). After the first 2-3 weeks of quitting, you may start to notice more positive results, such as:  Improved sense of smell and taste.  Decreased  coughing and sore throat.  Slower heart rate.  Lower blood pressure.  Clearer skin.  The ability to breathe more easily.  Fewer sick days. Quitting smoking is very challenging for most people. Do not get discouraged if you are  not successful the first time. Some people need to make many attempts to quit before they achieve long-term success. Do your best to stick to your quit plan, and talk with your health care provider if you have any questions or concerns.   This information is not intended to replace advice given to you by your health care provider. Make sure you discuss any questions you have with your health care provider.   Document Released: 06/11/2001 Document Revised: 11/01/2014 Document Reviewed: 11/01/2014 Elsevier Interactive Patient Education 2016 [employer]. Smoking Cessation, Tips for Success If you are ready to quit smoking, congratulations! You have chosen to help yourself be healthier. Cigarettes bring nicotine, tar, carbon monoxide, and other irritants into your body. Your lungs, heart, and blood vessels will be able to work better without these poisons. There are many different ways to quit smoking. Nicotine gum, nicotine patches, a nicotine inhaler, or nicotine nasal spray can help with physical craving. Hypnosis, support groups, and medicines help break the habit of smoking. WHAT THINGS CAN I DO TO MAKE QUITTING EASIER?  Here are some tips to help you quit for good:  Pick a date when you will quit smoking completely. Tell all of your friends and family about your plan to quit on that date.  Do not try to slowly cut down on the number of cigarettes you are smoking. Pick a quit date and quit smoking completely starting on that day.  Throw away all cigarettes.   Clean and remove all ashtrays from your home, work, and car.  On a card, write down your reasons for quitting. Carry the card with you and read it when you get the urge to smoke.  Cleanse your body of nicotine. Drink enough water and fluids to keep your urine clear or pale yellow. Do this after quitting to flush the nicotine from your body.  Learn to predict your moods. Do not let a bad situation be your excuse to have a cigarette.  Some situations in your life might tempt you into wanting a cigarette.  Never have "just one" cigarette. It leads to wanting another and another. Remind yourself of your decision to quit.  Change habits associated with smoking. If you smoked while driving or when feeling stressed, try other activities to replace smoking. Stand up when drinking your coffee. Brush your teeth after eating. Sit in a different chair when you read the paper. Avoid alcohol while trying to quit, and try to drink fewer caffeinated beverages. Alcohol and caffeine may urge you to smoke.  Avoid foods and drinks that can trigger a desire to smoke, such as sugary or spicy foods and alcohol.  Ask people who smoke not to smoke around you.  Have something planned to do right after eating or having a cup of coffee. For example, plan to take a walk or exercise.  Try a relaxation exercise to calm you down and decrease your stress. Remember, you may be tense and nervous for the first 2 weeks after you quit, but this will pass.  Find new activities to keep your hands busy. Play with a pen, coin, or rubber band. Doodle or draw things on paper.  Brush your teeth right after eating. This will help cut down on the  craving for the taste of tobacco after meals. You can also try mouthwash.   Use oral substitutes in place of cigarettes. Try using lemon drops, carrots, cinnamon sticks, or chewing gum. Keep them handy so they are available when you have the urge to smoke.  When you have the urge to smoke, try deep breathing.  Designate your home as a nonsmoking area.  If you are a heavy smoker, ask your health care provider about a prescription for nicotine chewing gum. It can ease your withdrawal from nicotine.  Reward yourself. Set aside the cigarette money you save and buy yourself something nice.  Look for support from others. Join a support group or smoking cessation program. Ask someone at home or at work to help you with your  plan to quit smoking.  Always ask yourself, "Do I need this cigarette or is this just a reflex?" Tell yourself, "Today, I choose not to smoke," or "I do not want to smoke." You are reminding yourself of your decision to quit.  Do not replace cigarette smoking with electronic cigarettes (commonly called e-cigarettes). The safety of e-cigarettes is unknown, and some may contain harmful chemicals.  If you relapse, do not give up! Plan ahead and think about what you will do the next time you get the urge to smoke. HOW WILL I FEEL WHEN I QUIT SMOKING? You may have symptoms of withdrawal because your body is used to nicotine (the addictive substance in cigarettes). You may crave cigarettes, be irritable, feel very hungry, cough often, get headaches, or have difficulty concentrating. The withdrawal symptoms are only temporary. They are strongest when you first quit but will go away within 10-14 days. When withdrawal symptoms occur, stay in control. Think about your reasons for quitting. Remind yourself that these are signs that your body is healing and getting used to being without cigarettes. Remember that withdrawal symptoms are easier to treat than the major diseases that smoking can cause.  Even after the withdrawal is over, expect periodic urges to smoke. However, these cravings are generally short lived and will go away whether you smoke or not. Do not smoke! WHAT RESOURCES ARE AVAILABLE TO HELP ME QUIT SMOKING? Your health care provider can direct you to community resources or hospitals for support, which may include:  Group support.  Education.  Hypnosis.  Therapy.   This information is not intended to replace advice given to you by your health care provider. Make sure you discuss any questions you have with your health care provider.   Document Released: 03/15/2004 Document Revised: 07/08/2014 Document Reviewed: 12/03/2012 Elsevier Interactive Patient Education 2016 [employer]   Hypoglycemia Low blood sugar (hypoglycemia) means that the level of sugar in your blood is lower than it should be. Signs of low blood sugar include:  Getting sweaty.  Feeling hungry.  Feeling dizzy or weak.  Feeling sleepier than normal.  Feeling nervous.  Headaches.  Having a fast heartbeat. Low blood sugar can happen fast and can be an emergency. Your doctor can do tests to check your blood sugar level. You can have low blood sugar and not have diabetes. HOME CARE  Check your blood sugar as told by your doctor. If it is less than 70 mg/dl or as told by your doctor, take 1 of the following:  3 to 4 glucose tablets.   cup clear juice.   cup soda pop, not diet.  1 cup milk.  5 to 6 hard candies.  Recheck blood sugar  after 15 minutes. Repeat until it is at the right level.  Eat a snack if it is more than 1 hour until the next meal.  Only take medicine as told by your doctor.  Do not skip meals. Eat on time.  Do not drink alcohol except with meals.  Check your blood glucose before driving.  Check your blood glucose before and after exercise.  Always carry treatment with you, such as glucose pills.  Always wear a medical alert bracelet if you have diabetes. GET HELP RIGHT AWAY IF:   Your blood glucose goes below 70 mg/dl or as told by your doctor, and you:  Are confused.  Are not able to swallow.  Pass out (faint).  You cannot treat yourself. You may need someone to help you.  You have low blood sugar problems often.  You have problems from your medicines.  You are not feeling better after 3 to 4 days.  You have vision changes. MAKE SURE YOU:   Understand these instructions.  Will watch this condition.  Will get help right away if you are not doing well or get worse.   This information is not intended to replace advice given to you by your health care provider. Make sure you discuss any questions you have with your health care  provider.   Document Released: 09/11/2009 Document Revised: 07/08/2014 Document Reviewed: 02/21/2015 Elsevier Interactive Patient Education 2016 [employer] Blood Glucose Monitoring, Adult Monitoring your blood glucose (also know as blood sugar) helps you to manage your diabetes. It also helps you and your health care provider monitor your diabetes and determine how well your treatment plan is working. WHY SHOULD YOU MONITOR YOUR BLOOD GLUCOSE?  It can help you understand how food, exercise, and medicine affect your blood glucose.  It allows you to know what your blood glucose is at any given moment. You can quickly tell if you are having low blood glucose (hypoglycemia) or high blood glucose (hyperglycemia).  It can help you and your health care provider know how to adjust your medicines.  It can help you understand how to manage an illness or adjust medicine for exercise. WHEN SHOULD YOU TEST? Your health care provider will help you decide how often you should check your blood glucose. This may depend on the type of diabetes you have, your diabetes control, or the types of medicines you are taking. Be sure to write down all of your blood glucose readings so that this information can be reviewed with your health care provider. See below for examples of testing times that your health care provider may suggest. Type 1 Diabetes  Test at least 2 times per day if your diabetes is well controlled, if you are using an insulin pump, or if you perform multiple daily injections.  If your diabetes is not well controlled or if you are sick, you may need to test more often.  It is a good idea to also test:  Before every insulin injection.  Before and after exercise.  Between meals and 2 hours after a meal.  Occasionally between 2:00 a.m. and 3:00 a.m. Type 2 Diabetes  If you are taking insulin, test at least 2 times per day. However, it is best to test before every insulin injection.  If  you take medicines by mouth (orally), test 2 times a day.  If you are on a controlled diet, test once a day.  If your diabetes is not well controlled or if you are sick,  you may need to monitor more often. HOW TO MONITOR YOUR BLOOD GLUCOSE Supplies Needed  Blood glucose meter.  Test strips for your meter. Each meter has its own strips. You must use the strips that go with your own meter.  A pricking needle (lancet).  A device that holds the lancet (lancing device).  A journal or log book to write down your results. Procedure  Wash your hands with soap and water. Alcohol is not preferred.  Prick the side of your finger (not the tip) with the lancet.  Gently milk the finger until a small drop of blood appears.  Follow the instructions that come with your meter for inserting the test strip, applying blood to the strip, and using your blood glucose meter. Other Areas to Get Blood for Testing Some meters allow you to use other areas of your body (other than your finger) to test your blood. These areas are called alternative sites. The most common alternative sites are:  The forearm.  The thigh.  The back area of the lower leg.  The palm of the hand. The blood flow in these areas is slower. Therefore, the blood glucose values you get may be delayed, and the numbers are different from what you would get from your fingers. Do not use alternative sites if you think you are having hypoglycemia. Your reading will not be accurate. Always use a finger if you are having hypoglycemia. Also, if you cannot feel your lows (hypoglycemia unawareness), always use your fingers for your blood glucose checks. ADDITIONAL TIPS FOR GLUCOSE MONITORING  Do not reuse lancets.  Always carry your supplies with you.  All blood glucose meters have a 24-hour "hotline" number to call if you have questions or need help.  Adjust (calibrate) your blood glucose meter with a control solution after finishing a  few boxes of strips. BLOOD GLUCOSE RECORD KEEPING It is a good idea to keep a daily record or log of your blood glucose readings. Most glucose meters, if not all, keep your glucose records stored in the meter. Some meters come with the ability to download your records to your home computer. Keeping a record of your blood glucose readings is especially helpful if you are wanting to look for patterns. Make notes to go along with the blood glucose readings because you might forget what happened at that exact time. Keeping good records helps you and your health care provider to work together to achieve good diabetes management.    This information is not intended to replace advice given to you by your health care provider. Make sure you discuss any questions you have with your health care provider.   Document Released: 06/20/2003 Document Revised: 07/08/2014 Document Reviewed: 11/09/2012 Elsevier Interactive Patient Education [employer].

## 2016-02-17 NOTE — Progress Notes (Signed)
Patient and family given discharge instructions including medications taken, follow up instructions provided. No prescriptions wriiten for discharge. Patient verbalizes understanding of discharge  Instructions.

## 2016-02-17 NOTE — Discharge Summary (Signed)
Physician Discharge Summary  Jacqueline Munoz JGG:[id number] DOB: September 13, 1960 DOA: 02/15/2016  PCP: [name], MD  Admit date: 02/15/2016 Discharge date: 02/17/2016  Recommendations for Outpatient Follow-up:  1. Follow up with PCP in 1weeks 2. Please obtain BMP/CBC in one week   Discharge Condition: Stable CODE STATUS: FULL Diet recommendation: Heart Healthy / Carb Modifie  Brief/Interim Summary: Jacqueline Seamonsis a 55 y.o.femalewith medical history significant of CAD s/p stents in May 2016 as well as HTN, DM, HLD, tobacco dependence presenting with acute onset of chest pain. Patient reports that she was at [facility] last Friday, glucose >400, given IVF, had headache. Eased off with Percocet but not resolved. Headache has been persistent since Friday. This AM, it was hurting really bad and she could feel her heart beating in her head. 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 Weight has been up 16 pounds recently but has been taking Lasix to bring it down. Substernal chest pressure and a little to the left, also with L arm and back pain radiation. Non-exertional. Relieved with NTG. This pressure feels more like with CHF exacerbation than prior MI, but she is not having significant SOB like last CHF exacerbation.Pt admits that she has not been taking her meds as prescribed.   Assessment & Plan:   Chest pain -Patient with substernal chest pain that came on at restand was improved with NTG. -2/3 typical symptoms suggestive of atypical chest pain.  -CXR unremarkable.  -Cardiac enzymes negative.  -EKG not indicative of acute ischemia.  -TIMI risk score is 4; which predicts a 14 days risk of death, recurrent MI, or urgent revascularization of 19.9%.  -Will plan to place in observation status on telemetryto  rule out ACS by overnight observation.  -Continue ASA '81mg'$  PO daily and Plavix 75 mg PO daily -morphine given; takes high-dose Lipitor daily and this will be continued -Continue Imdur -Risk factor stratification with FLP and HgbA1c; will also check TSH and UDS -Stress test nuclear: no reversible ischemia seen.  SEE FULL REPORT  HTN -Continue Coreg (after stress test),  Hypokalemia -Takes BID KCl but still slightly low in ER -Repleted orally per Dr. Winfred Leeds -Likely resulting from Lasix use -Will continue home KCl.   Depression/Anxiety -I have reviewed this patient in the [place] Controlled Substances Reporting System. She is receiving medications from only one provider and appears to be taking them as prescribed -Continue home Ativan  DM -Continue home meds at discharge.  A1c 10.8% - which is consistent with her noncompliance, counseled.  Hypothyroidism -High TSH noted. Pt admits that she has not been taking levothyroxine -Restart home levothyroxine 200 mcg daily.    HFpEF -Nl EF with grade 2 diastolic dysfunction in 03/54 -Will not plan to repeat Echo at this time -Is on appropriate medications (ACE, BB, Lasix) without current evidence of exacerbation  Marijuana abuse -Uses qhs for sleep -Cessation encouraged  OSA -Claustrophobia and so will not wear CPAP  Tobacco dependence -Encourage cessation. This was discussed with the patient and should be reviewed on an ongoing basis. -Nicotine Patch ordered at patient request.  Chronic pain -I have reviewed this patient in the  Controlled Substances Reporting System.  -She is not taking chronic opiates. .   Severe Headaches - Pt says that she is having worse headache she can remember for long time.  CT  head STAT negative for acute findings.    DVT prophylaxis:Lovenox  Code Status:Full - confirmed with patient/family Family Communication:None present Disposition Plan:Home  Consults  called:Cardiology Admission status:Observation - telemetry  Procedures:  Stress testing   Discharge Diagnoses:  Principal Problem:   Chest pain Active Problems:   Essential hypertension   Hypothyroid   Anxiety and depression   Hypokalemia   Chronic pain syndrome   Obesity   Cephalalgia   Chronic diastolic heart failure (HCC)   Diabetes mellitus, type 2 (HCC)   CAD S/P percutaneous coronary angioplasty   Mild tetrahydrocannabinol (THC) abuse   Tobacco dependence   OSA (obstructive sleep apnea)  Discharge Instructions  Discharge Instructions    Increase activity slowly    Complete by:  As directed       Medication List    STOP taking these medications   lisinopril 10 MG tablet Commonly known as:  PRINIVIL,ZESTRIL     TAKE these medications   acetaminophen 500 MG chewable tablet Commonly known as:  TYLENOL Chew 1,000 mg by mouth every 6 (six) hours as needed for pain.   aspirin 81 MG EC tablet Take 1 tablet (81 mg total) by mouth daily.   atorvastatin 40 MG tablet Commonly known as:  LIPITOR TAKE 2 TABLETS BY MOUTH DAILY AT 6 PM.   carvedilol 3.125 MG tablet Commonly known as:  COREG TAKE 1 TABLET BY MOUTH 2 TIMES DAILY WITH A MEAL.   clopidogrel 75 MG tablet Commonly known as:  PLAVIX Take 1 tablet (75 mg total) by mouth daily.   FLUoxetine 20 MG capsule Commonly known as:  PROZAC Take 40 mg by mouth daily.   furosemide 40 MG tablet Commonly known as:  LASIX Take 1 tablet (40 mg total) by mouth daily.   glucose monitoring kit monitoring kit 1 each by Does not apply route 4 (four) times daily - after meals and at bedtime. 1 month Diabetic Testing Supplies for QAC-QHS accuchecks.   insulin aspart 100 UNIT/ML injection Commonly known as:  novoLOG Inject 0-10 Units into the skin 3 (three) times daily as needed for high blood sugar (as needed for blood sugar coverage-sliding scale).   Insulin Detemir 100 UNIT/ML Pen Commonly known as:   LEVEMIR INJECT 35 UNITS INTO THE SKIN AT BEDTIME.   Insulin Syringe-Needle U-100 30G X 5/16" 0.5 ML Misc Commonly known as:  TRUEPLUS INSULIN SYRINGE 1 each by Does not apply route 4 (four) times daily.   isosorbide mononitrate 30 MG 24 hr tablet Commonly known as:  IMDUR TAKE 1 TABLET BY MOUTH DAILY.   levothyroxine 200 MCG tablet Commonly known as:  SYNTHROID, LEVOTHROID Take 1 tablet (200 mcg total) by mouth daily before breakfast.   LORazepam 1 MG tablet Commonly known as:  ATIVAN Take 1 mg by mouth daily.   metFORMIN 500 MG tablet Commonly known as:  GLUCOPHAGE TAKE 1 TABLET BY MOUTH 2 TIMES DAILY WITH A MEAL.   nicotine 14 mg/24hr patch Commonly known as:  NICODERM CQ - dosed in mg/24 hours Place 1 patch (14 mg total) onto the skin at bedtime.   nitroGLYCERIN 0.4 MG SL tablet Commonly known as:  NITROSTAT Place 0.4 mg under the tongue every 5 (five) minutes as needed for chest pain (may take three doses by mouth per day as needed).   ondansetron 8 MG disintegrating tablet Commonly known as:  ZOFRAN ODT Take 1 tablet (8 mg total) by mouth every 8 (eight) hours as needed for nausea or vomiting.  Potassium Chloride ER 20 MEQ Tbcr Take 40 mEq by mouth 2 (two) times daily.   ranitidine 150 MG tablet Commonly known as:  ZANTAC Take 1 tablet (150 mg total) by mouth 2 (two) times daily.      Follow-up Information    [name], MD. Schedule an appointment as soon as possible for a visit in 1 week(s).   Specialty:  Family Medicine Contact information: [place] [address] [phone]        Glenetta Hew, MD. Schedule an appointment as soon as possible for a visit in 2 week(s).   Specialty:  Cardiology Why:  Hospital Follow up Contact information: [address] [place] [address] [phone]          Allergies  Allergen Reactions  . Corticosteroids Anaphylaxis    ALL steroids.     Recardo Evangelist [Pregabalin]  Shortness Of Breath and Swelling  . Penicillins Anaphylaxis    Has patient had a PCN reaction causing immediate rash, facial/tongue/throat swelling, SOB or lightheadedness with hypotension: {Yes Has patient had a PCN reaction causing severe rash involving mucus membranes or skin necrosis: NO Has patient had a PCN reaction that required hospitalization NO Has patient had a PCN reaction occurring within the last 10 years: {Yes If all of the above answers are "NO", then may proceed with Cephalosporin use.   . Amitriptyline Other (See Comments)    Muscle Cramps  . Gabapentin Swelling    Procedures/Studies: Dg Chest 2 View  Result Date: 02/15/2016 CLINICAL DATA:  Central chest pain today. EXAM: CHEST  2 VIEW COMPARISON:  Two-view chest x-ray 11/25/2015. FINDINGS: The heart size is normal. The lungs are clear. The visualized soft tissues and bony thorax are unremarkable. IMPRESSION: Negative two view chest x-ray Electronically Signed   By: San Morelle M.D.   On: 02/15/2016 19:55   Ct Head Wo Contrast  Result Date: 02/16/2016 CLINICAL DATA:  Initial evaluation for acute severe headache for 1 week, nausea, dizziness. EXAM: CT HEAD WITHOUT CONTRAST TECHNIQUE: Contiguous axial images were obtained from the base of the skull through the vertex without intravenous contrast. COMPARISON:  None available. FINDINGS: Brain: Age appropriate cerebral atrophy present. Mild chronic microvascular ischemic disease present within the periventricular and deep white matter both cerebral hemisphere. No acute intracranial hemorrhage. No evidence for acute large vessel territory infarct. No mass lesion, midline shift or mass effect. No hydrocephalus. No extra-axial fluid collection. Vascular: Mild scattered atheromatous plaque noted within the carotid siphons. Probable mild atheromatous disease within the vertebrobasilar system as well. No definite hyperdense vessel. Skull: Scalp soft tissues demonstrate no acute  abnormality. Calvarium intact. Sinuses/Orbits: The globes and orbits within normal limits. Paranasal sinuses are clear. No mastoid effusion. Patient is status post mastoidectomy on the left. Other: No other significant finding. IMPRESSION: 1. No acute intracranial process identified. 2. Mild chronic microvascular ischemic disease. Electronically Signed   By: Jeannine Boga M.D.   On: 02/16/2016 12:43   Nm Myocar Multi W/spect W/wall Motion / Ef  Result Date: 02/16/2016 CLINICAL DATA:  55 year old with chest pain. EXAM: MYOCARDIAL IMAGING WITH SPECT (REST AND PHARMACOLOGIC-STRESS) GATED LEFT VENTRICULAR WALL MOTION STUDY LEFT VENTRICULAR EJECTION FRACTION TECHNIQUE: Standard myocardial SPECT imaging was performed after resting intravenous injection of 10 mCi Tc-38mtetrofosmin. Subsequently, intravenous infusion of Lexiscan was performed under the supervision of the Cardiology staff. At peak effect of the drug, 30 mCi Tc-927metrofosmin was injected intravenously and standard myocardial SPECT imaging was  performed. Quantitative gated imaging was also performed to evaluate left ventricular wall motion, and estimate left ventricular ejection fraction. COMPARISON:  04/21/2015 FINDINGS: Perfusion: No decreased activity in the left ventricle on stress imaging to suggest reversible ischemia or infarction. Wall Motion: Normal left ventricular wall motion. No left ventricular dilation. Left Ventricular Ejection Fraction: 71 % End diastolic volume 80 ml End systolic volume 23 ml IMPRESSION: 1. No reversible ischemia or infarction. 2. Normal left ventricular wall motion. 3. Left ventricular ejection fraction is 71%. 4. Non invasive risk stratification*: Low *2012 Appropriate Use Criteria for Coronary Revascularization Focused Update: J Am Coll Cardiol. 0947;09(6):283-662. [email or web address] Electronically Signed   By: Markus Daft M.D.   On: 02/16/2016 14:56     Subjective: Pt says she feels much better today.  No complaints.    Discharge Exam: Vitals:   02/16/16 2001 02/17/16 0654  BP: (!) 107/53 120/67  Pulse: (!) 54 (!) 49  Resp: 16 17  Temp: 98.3 F (36.8 C)    Vitals:   02/16/16 1400 02/16/16 1712 02/16/16 2001 02/17/16 0654  BP: (!) 85/40 (!) 95/38 (!) 107/53 120/67  Pulse: (!) 58 (!) 44 (!) 54 (!) 49  Resp: '18  16 17  '$ Temp: 98.2 F (36.8 C)  98.3 F (36.8 C)   TempSrc: Oral  Oral   SpO2: 97%  91% 97%  Weight:    84.6 kg (186 lb 6.4 oz)  Height:       General exam: awake, alert, NAD. Cooperative.  Heavy smoker's voice.  Respiratory system: Clear. No increased work of breathing. Cardiovascular system: S1 & S2 heard, RRR. No JVD, murmurs, gallops, clicks or pedal edema. Gastrointestinal system: Abdomen is nondistended, soft and nontender. Normal bowel sounds heard. Central nervous system: Alert and oriented. No focal neurological deficits. Extremities: no cyanosis.  The results of significant diagnostics from this hospitalization (including imaging, microbiology, ancillary and laboratory) are listed below for reference.     Microbiology: No results found for this or any previous visit (from the past 240 hour(s)).   Labs: BNP (last 3 results)  Recent Labs  04/20/15 2056 06/12/15 0642 11/26/15 0121  BNP 17.4 609.2* 94.7   Basic Metabolic Panel:  Recent Labs Lab 02/15/16 1900 02/16/16 1125  NA 136 136  K 3.2* 3.7  CL 99* 101  CO2 28 27  GLUCOSE 323* 222*  BUN 6 7  CREATININE 0.71 0.64  CALCIUM 8.5* 8.6*   Liver Function Tests: No results for input(s): AST, ALT, ALKPHOS, BILITOT, PROT, ALBUMIN in the last 168 hours. No results for input(s): LIPASE, AMYLASE in the last 168 hours. No results for input(s): AMMONIA in the last 168 hours. CBC:  Recent Labs Lab 02/15/16 1900  WBC 9.5  HGB 12.7  HCT 37.9  MCV 88.8  PLT 231   Cardiac Enzymes:  Recent Labs Lab 02/16/16 0132 02/16/16 0755  02/16/16 1125  TROPONINI <0.03 <0.03 <0.03   BNP: Invalid input(s): POCBNP CBG:  Recent Labs Lab 02/16/16 0704 02/16/16 1132 02/16/16 1637 02/16/16 2101 02/17/16 0701  GLUCAP 141* 210* 214* 203* 113*   D-Dimer No results for input(s): DDIMER in the last 72 hours. Hgb A1c  Recent Labs  02/15/16 2207  HGBA1C 10.8*   Lipid Profile  Recent Labs  02/15/16 2207  CHOL 213*  HDL 49  LDLCALC 117*  TRIG 233*  CHOLHDL 4.3   Thyroid function studies  Recent Labs  02/15/16 2207  TSH 64.507*   Anemia work up No results for input(s):  VITAMINB12, FOLATE, FERRITIN, TIBC, IRON, RETICCTPCT in the last 72 hours. Urinalysis    Component Value Date/Time   COLORURINE YELLOW 02/09/2016 0234   APPEARANCEUR CLEAR 02/09/2016 0234   LABSPEC 1.025 02/09/2016 0234   PHURINE 6.0 02/09/2016 0234   GLUCOSEU >1000 (A) 02/09/2016 0234   HGBUR NEGATIVE 02/09/2016 0234   BILIRUBINUR NEGATIVE 02/09/2016 0234   BILIRUBINUR small 07/20/2015 1043   KETONESUR NEGATIVE 02/09/2016 0234   PROTEINUR NEGATIVE 02/09/2016 0234   UROBILINOGEN 0.2 07/20/2015 1043   UROBILINOGEN 0.2 04/20/2015 2055   NITRITE NEGATIVE 02/09/2016 0234   LEUKOCYTESUR NEGATIVE 02/09/2016 0234   Sepsis Labs Invalid input(s): PROCALCITONIN,  WBC,  LACTICIDVEN Microbiology No results found for this or any previous visit (from the past 240 hour(s)).  Time coordinating discharge: 27 minutes  SIGNED:  Irwin Brakeman, MD  [facility] 02/17/2016, 9:37 AM Pager   If 7PM-7AM, please contact night-coverage [email or web address] Password [email or web address]

## 2016-02-19 ENCOUNTER — Telehealth: Payer: Self-pay | Admitting: Family Medicine

## 2016-02-19 NOTE — Telephone Encounter (Signed)
Pt calling stating she was recently in the hospital for chest pains and yesterday, 8/20 for 30 minutes she was unable to move any of her muscles  Pt stated the same experience happened again today, but it lasted for an hour.   Pt wanting to know if she should go to the ED or can wait till her next appointment  Pt has an appointment with Zettie Pho this Friday for a HFU

## 2016-02-21 NOTE — Telephone Encounter (Signed)
Patient states that she is no longer having trouble with her muscles, patient stated that she stopped taking her lasix.

## 2016-02-23 ENCOUNTER — Inpatient Hospital Stay: Payer: Medicare Other

## 2016-02-23 ENCOUNTER — Ambulatory Visit: Payer: Medicare Other | Attending: Family Medicine | Admitting: Physician Assistant

## 2016-02-23 VITALS — BP 134/90 | HR 65 | Temp 98.2°F | Ht 62.0 in | Wt 179.0 lb

## 2016-02-23 DIAGNOSIS — E039 Hypothyroidism, unspecified: Secondary | ICD-10-CM | POA: Diagnosis not present

## 2016-02-23 DIAGNOSIS — Z7982 Long term (current) use of aspirin: Secondary | ICD-10-CM | POA: Insufficient documentation

## 2016-02-23 DIAGNOSIS — Z888 Allergy status to other drugs, medicaments and biological substances status: Secondary | ICD-10-CM | POA: Insufficient documentation

## 2016-02-23 DIAGNOSIS — Z794 Long term (current) use of insulin: Secondary | ICD-10-CM | POA: Diagnosis not present

## 2016-02-23 DIAGNOSIS — R1013 Epigastric pain: Secondary | ICD-10-CM

## 2016-02-23 DIAGNOSIS — E1151 Type 2 diabetes mellitus with diabetic peripheral angiopathy without gangrene: Secondary | ICD-10-CM | POA: Diagnosis not present

## 2016-02-23 DIAGNOSIS — E1165 Type 2 diabetes mellitus with hyperglycemia: Secondary | ICD-10-CM | POA: Diagnosis not present

## 2016-02-23 DIAGNOSIS — Z880 Allergy status to penicillin: Secondary | ICD-10-CM | POA: Diagnosis not present

## 2016-02-23 DIAGNOSIS — E785 Hyperlipidemia, unspecified: Secondary | ICD-10-CM | POA: Diagnosis not present

## 2016-02-23 DIAGNOSIS — E119 Type 2 diabetes mellitus without complications: Secondary | ICD-10-CM

## 2016-02-23 DIAGNOSIS — I2510 Atherosclerotic heart disease of native coronary artery without angina pectoris: Secondary | ICD-10-CM | POA: Diagnosis not present

## 2016-02-23 DIAGNOSIS — Z09 Encounter for follow-up examination after completed treatment for conditions other than malignant neoplasm: Secondary | ICD-10-CM | POA: Diagnosis not present

## 2016-02-23 DIAGNOSIS — F329 Major depressive disorder, single episode, unspecified: Secondary | ICD-10-CM | POA: Insufficient documentation

## 2016-02-23 DIAGNOSIS — G4733 Obstructive sleep apnea (adult) (pediatric): Secondary | ICD-10-CM | POA: Diagnosis not present

## 2016-02-23 DIAGNOSIS — Z79899 Other long term (current) drug therapy: Secondary | ICD-10-CM | POA: Diagnosis not present

## 2016-02-23 DIAGNOSIS — I10 Essential (primary) hypertension: Secondary | ICD-10-CM | POA: Diagnosis not present

## 2016-02-23 DIAGNOSIS — Z9861 Coronary angioplasty status: Secondary | ICD-10-CM

## 2016-02-23 LAB — GLUCOSE, POCT (MANUAL RESULT ENTRY): POC Glucose: 233 mg/dl — AB (ref 70–99)

## 2016-02-23 MED ORDER — ASPIRIN 81 MG PO TBEC: 81.0000 mg | DELAYED_RELEASE_TABLET | Freq: Every day | ORAL | 3 refills | Status: AC

## 2016-02-23 MED ORDER — POTASSIUM CHLORIDE ER 20 MEQ PO TBCR: 40.0000 meq | EXTENDED_RELEASE_TABLET | Freq: Two times a day (BID) | ORAL | 3 refills | Status: DC

## 2016-02-23 MED ORDER — CARVEDILOL 3.125 MG PO TABS: ORAL_TABLET | ORAL | 3 refills | Status: AC

## 2016-02-23 MED ORDER — ATORVASTATIN CALCIUM 40 MG PO TABS: ORAL_TABLET | ORAL | 3 refills | Status: DC

## 2016-02-23 MED ORDER — LEVOTHYROXINE SODIUM 200 MCG PO TABS: 200.0000 ug | ORAL_TABLET | Freq: Every day | ORAL | 3 refills | Status: DC

## 2016-02-23 MED ORDER — INSULIN ASPART 100 UNIT/ML ~~LOC~~ SOLN: 2.0000 [IU] | Freq: Three times a day (TID) | SUBCUTANEOUS | 3 refills | Status: DC | PRN

## 2016-02-23 MED ORDER — FLUOXETINE HCL 20 MG PO CAPS: 40.0000 mg | ORAL_CAPSULE | Freq: Every day | ORAL | 3 refills | Status: DC

## 2016-02-23 MED ORDER — LORAZEPAM 1 MG PO TABS: 1.0000 mg | ORAL_TABLET | Freq: Every day | ORAL | 0 refills | Status: DC

## 2016-02-23 MED ORDER — RANITIDINE HCL 150 MG PO TABS: 150.0000 mg | ORAL_TABLET | Freq: Two times a day (BID) | ORAL | 3 refills | Status: DC

## 2016-02-23 MED ORDER — FUROSEMIDE 40 MG PO TABS: 40.0000 mg | ORAL_TABLET | Freq: Every day | ORAL | 3 refills | Status: AC

## 2016-02-23 MED ORDER — METFORMIN HCL 500 MG PO TABS: ORAL_TABLET | ORAL | 3 refills | Status: DC

## 2016-02-23 MED ORDER — INSULIN DETEMIR 100 UNIT/ML FLEXPEN: PEN_INJECTOR | SUBCUTANEOUS | 3 refills | Status: AC

## 2016-02-23 MED ORDER — CLOPIDOGREL BISULFATE 75 MG PO TABS: 75.0000 mg | ORAL_TABLET | Freq: Every day | ORAL | 3 refills | Status: DC

## 2016-02-23 MED ORDER — "INSULIN SYRINGE-NEEDLE U-100 30G X 5/16"" 0.5 ML MISC": 1.0000 | Freq: Four times a day (QID) | 11 refills | Status: AC

## 2016-02-23 MED ORDER — ISOSORBIDE MONONITRATE ER 30 MG PO TB24: 30.0000 mg | ORAL_TABLET | Freq: Every day | ORAL | 3 refills | Status: DC

## 2016-02-23 NOTE — Progress Notes (Signed)
Jacqueline Munoz  NKN:[id number]  [id number]:[id number]  DOB - 1961-03-14  Chief Complaint  Patient presents with  . Hospitalization Follow-up    chest pains, muscle weakness       Subjective:   Jacqueline Munoz is a 55 y.o. female here today for Hospital followup. She has a history of diabetes mellitus, hypothyroidism, coronary artery disease, hyperlipidemia, hypertension, diastolic dysfunction and obstructive sleep apnea noncompliant with CPAP. She presented to the emergency room and on08/13/2017 for her blood sugar being high. It was 409. She was treated with IV fluids and sent home. A few days later she repairs in with chest pain. This was 02/15/2016.Her pain was managed with Percocet. She ruled out for myocardial infarction. She was seen by cardiology and set up for Cardiolite stress testing. This was negative for ischemia. Her labs were significant for a potassium that was low of 3.0. It was replaced and bk to normal at the time of discharge. Her TSH was 64. She stated that she had not been taking her thyroid medications for many months because she ran out. She is now back on her thyroid regimen as well.   Today she is CP free. Her breathing is okay. She has been more compliant with her medications over the last week. She states she'll be moving to [facility] within the next couple weeks. She needs all her prescriptions printed out.   ROS:No complaints GEN: denies fever or chills, denies change in weight Skin: denies lesions or rashes HEENT: denies headache, earache, epistaxis, sore throat, or neck pain LUNGS: denies SHOB, dyspnea, PND, orthopnea CV: denies CP or palpitations ABD: denies abd pain, N or V EXT: denies muscle spasms or swelling; no pain in lower ext, no weakness NEURO: denies numbness or tingling, denies sz, stroke or TIA   ALLERGIES: Allergies  Allergen Reactions  . Corticosteroids Anaphylaxis    ALL steroids.     Recardo Evangelist [Pregabalin] Shortness Of Breath and Swelling    . Penicillins Anaphylaxis    Has patient had a PCN reaction causing immediate rash, facial/tongue/throat swelling, SOB or lightheadedness with hypotension: {Yes Has patient had a PCN reaction causing severe rash involving mucus membranes or skin necrosis: NO Has patient had a PCN reaction that required hospitalization NO Has patient had a PCN reaction occurring within the last 10 years: {Yes If all of the above answers are "NO", then may proceed with Cephalosporin use.   . Amitriptyline Other (See Comments)    Muscle Cramps  . Gabapentin Swelling    PAST MEDICAL HISTORY: Past Medical History:  Diagnosis Date  . Arthritis   . CAD S/P percutaneous coronary angioplasty    a. NSTEMI: s/p DES x2 LAD and DES to bifurcation of OM1 and mid to distal LCx (11/16/14) b. UA/ACS:  overlapping DES x1 to LAD for de novo 99% focal lesion in LAD (12/02/2014)  . Chronic back pain   . Chronic depression   . Coronary artery chronic total occlusion: 100% RCA CTO 11/16/2014   a. Prox RCA to Mid RCA lesion, 100% stenosed.   . Diabetes mellitus, type 2 ([place])   . Essential hypertension   . Fibromyalgia   . History of stomach ulcers   . History of tobacco abuse   . HLD (hyperlipidemia)   . Hypothyroid   . Kidney stones   . OSA (obstructive sleep apnea)    a. does not wear CPAP  . Type IVa MI - Peri-PCI 11/17/2014   Prolonged ischemia during bifurcation PCI of  m-d Cx-OM1 with minicrush technique    PAST SURGICAL HISTORY: Past Surgical History:  Procedure Laterality Date  . CARDIAC CATHETERIZATION N/A 11/16/2014   Procedure: Left Heart Cath and Coronary Angiography;  Surgeon: Peter M Martinique, MD;  Location: [facility];  Service: Cardiovascular;  RCA 100% CTO, m-dLAD 90%, m-dCx 95%, OM1 90%  . CARDIAC CATHETERIZATION  11/16/2014   Procedure: Coronary Stent Intervention;  Surgeon: Peter M Martinique, MD;  Location: [facility];  Service: Cardiovascular;;m-d LAD overlapping 3.0 x 16 & 2.75 x 16  Promus P DES; OM1-dCx Minicrush bifurcation (dCx 2.25 x 38 Synergy DES crushed with 2.75 x 24 Synergy DES in OM1)   . CARDIAC CATHETERIZATION N/A 12/02/2014   Procedure: Left Heart Cath and Coronary Angiography;  Surgeon: Leonie Man, MD;  Location: [facility];  Service: Cardiovascular;  Previous Stents in LAD & Cx-OM patent, mLAD prox to stents 99%  . CARDIAC CATHETERIZATION N/A 12/02/2014   Procedure: Coronary Stent Intervention;  Surgeon: Leonie Man, MD;  Location: [facility];  Service: Cardiovascular;  3rd overlapping (prox) mLAD Promus Premier DES 3.0 x 20 (3.25 mm)  . CESAREAN SECTION  2000  . INNER EAR SURGERY Bilateral "several"  . TONSILLECTOMY    . TOTAL ABDOMINAL HYSTERECTOMY  ~ 2008  . TRANSTHORACIC ECHOCARDIOGRAM  5/19 & 6/ 2016   a) EF 65-70%, mild LVH, Gr 1 DD; b) EF 60% -ron RWMA, no valve lesions  . TUBAL LIGATION  2000    MEDICATIONS AT HOME: Prior to Admission medications   Medication Sig Start Date End Date Taking? Authorizing Provider  acetaminophen (TYLENOL) 500 MG chewable tablet Chew 1,000 mg by mouth every 6 (six) hours as needed for pain.    Historical Provider, MD  aspirin 81 MG EC tablet Take 1 tablet (81 mg total) by mouth daily. 02/23/16   Brayton Caves, PA-C  atorvastatin (LIPITOR) 40 MG tablet TAKE 2 TABLETS BY MOUTH DAILY AT 6 PM. 02/23/16   Brayton Caves, PA-C  carvedilol (COREG) 3.125 MG tablet TAKE 1 TABLET BY MOUTH 2 TIMES DAILY WITH A MEAL. 02/23/16   Brayton Caves, PA-C  clopidogrel (PLAVIX) 75 MG tablet Take 1 tablet (75 mg total) by mouth daily. 02/23/16   Masson Nalepa Daneil Dan, PA-C  FLUoxetine (PROZAC) 20 MG capsule Take 2 capsules (40 mg total) by mouth daily. 02/23/16   Brayton Caves, PA-C  furosemide (LASIX) 40 MG tablet Take 1 tablet (40 mg total) by mouth daily. 02/23/16   Brayton Caves, PA-C  glucose monitoring kit (FREESTYLE) monitoring kit 1 each by Does not apply route 4 (four) times daily - after meals and at bedtime. 1 month  Diabetic Testing Supplies for QAC-QHS accuchecks. 11/16/14   Debbe Odea, MD  insulin aspart (NOVOLOG) 100 UNIT/ML injection Inject 2-10 Units into the skin 3 (three) times daily as needed for high blood sugar (as needed for blood sugar coverage-sliding scale). 02/23/16   Brayton Caves, PA-C  Insulin Detemir (LEVEMIR) 100 UNIT/ML Pen INJECT 35 UNITS INTO THE SKIN AT BEDTIME. 02/23/16   Brayton Caves, PA-C  Insulin Syringe-Needle U-100 (TRUEPLUS INSULIN SYRINGE) 30G X 5/16" 0.5 ML MISC 1 each by Does not apply route 4 (four) times daily. 02/23/16   Brayton Caves, PA-C  isosorbide mononitrate (IMDUR) 30 MG 24 hr tablet Take 1 tablet (30 mg total) by mouth daily. 02/23/16   Brayton Caves, PA-C  levothyroxine (SYNTHROID, LEVOTHROID) 200 MCG  tablet Take 1 tablet (200 mcg total) by mouth daily before breakfast. 02/23/16   Brayton Caves, PA-C  LORazepam (ATIVAN) 1 MG tablet Take 1 tablet (1 mg total) by mouth daily. 02/23/16   Brayton Caves, PA-C  metFORMIN (GLUCOPHAGE) 500 MG tablet TAKE 1 TABLET BY MOUTH 2 TIMES DAILY WITH A MEAL. 02/23/16   Brayton Caves, PA-C  nicotine (NICODERM CQ - DOSED IN MG/24 HOURS) 14 mg/24hr patch Place 1 patch (14 mg total) onto the skin at bedtime. 02/17/16   Clanford Marisa Hua, MD  nitroGLYCERIN (NITROSTAT) 0.4 MG SL tablet Place 0.4 mg under the tongue every 5 (five) minutes as needed for chest pain (may take three doses by mouth per day as needed).    Historical Provider, MD  ondansetron (ZOFRAN ODT) 8 MG disintegrating tablet Take 1 tablet (8 mg total) by mouth every 8 (eight) hours as needed for nausea or vomiting. 12/08/15   Josalyn Funches, MD  Potassium Chloride ER 20 MEQ TBCR Take 40 mEq by mouth 2 (two) times daily. 02/23/16 02/25/16  Shamina Etheridge Daneil Dan, PA-C  ranitidine (ZANTAC) 150 MG tablet Take 1 tablet (150 mg total) by mouth 2 (two) times daily. 02/23/16   Brayton Caves, PA-C     Objective:   Vitals:   02/23/16 1412  BP: 134/90  Pulse: 65  Temp: 98.2 F (36.8 C)    TempSrc: Oral  SpO2: 97%  Weight: 179 lb (81.2 kg)  Height: '5\' 2"'$  (1.575 m)    Exam General appearance : Awake, alert, not in any distress. Speech Clear. Not toxic looking HEENT: Atraumatic and Normocephalic, pupils equally reactive to light and accomodation Neck: supple, no JVD. No cervical lymphadenopathy.  Chest:Good air entry bilaterally, no added sounds  CVS: S1 S2 regular, no murmurs.  Abdomen: Bowel sounds present, Non tender and not distended with no gaurding, rigidity or rebound. Extremities: B/L Lower Ext shows no edema, both legs are warm to touch Neurology: Awake alert, and oriented X 3, CN II-XII intact, Non focal   Data Review Lab Results  Component Value Date   HGBA1C 10.8 (H) 02/15/2016   HGBA1C 7.4 10/17/2015   HGBA1C 7.30 06/27/2015     Assessment & Plan  1. CP, resolved; noncardiac   2. CAD  -cont RF modification; needs better BS control/compliance  -Cont ASA, BB, Plavix, Statin  -increase exercise as tol  3. DM2 with hyperglycemia  -Cont current meds  -added Novolin scale back  -Aim for 30 minutes of exercise most days. Rethink what you drink. Water is great! Aim for 2-3 Carb Choices per meal (30-45 grams) +/- 1 either way  Aim for 0-15 Carbs per snack if hungry  Include protein in moderation with your meals and snacks  Consider reading food labels for Total Carbohydrate and Fat Grams of foods  Consider checking BG at alternate times per day  Continue taking medication as directed Be mindful about how much sugar you are adding to beverages and other foods. Fruit Punch - find one with no sugar  Measure and decrease portions of carbohydrate foods  Make your plate and don't go back for seconds  4. HTN  -DASH diet  -cont current antihypertensives 5. Hypothyroidism  -restarted Thyroid replacement last week  -needs TSH in 6 weeks 6. HL  -Cont Statin  Encouraged medication compliance Encouraged to establish care in [facility] with  a Provider 40 min spent with pt regarding RF modification and given printed copies of all meds for  her travels.  The patient was given clear instructions to go to ER or return to medical center if symptoms don't improve, worsen or new problems develop. The patient verbalized understanding. The patient was told to call to get lab results if they haven't heard anything in the next week.   This note has been created with Surveyor, quantity. Any transcriptional errors are unintentional.    Zettie Pho, PA-C [facility] and [facility] [place], [place]   02/23/2016, 2:40 PM

## 2016-04-04 ENCOUNTER — Other Ambulatory Visit: Payer: Self-pay | Admitting: Pharmacist

## 2016-04-04 MED ORDER — ATORVASTATIN CALCIUM 80 MG PO TABS: 80.0000 mg | ORAL_TABLET | Freq: Every day | ORAL | 0 refills | Status: DC

## 2016-04-04 MED FILL — ATORVASTATIN 80 MG TABLET: 80 | 90 days supply | Qty: 90 | Fill #0

## 2016-04-09 ENCOUNTER — Other Ambulatory Visit: Payer: Self-pay | Admitting: Family Medicine

## 2016-04-09 DIAGNOSIS — F329 Major depressive disorder, single episode, unspecified: Secondary | ICD-10-CM

## 2016-04-09 DIAGNOSIS — F419 Anxiety disorder, unspecified: Principal | ICD-10-CM

## 2016-04-09 MED ORDER — LORAZEPAM 1 MG PO TABS: 1.0000 mg | ORAL_TABLET | Freq: Every day | ORAL | 2 refills | Status: DC

## 2016-04-09 MED FILL — POTASSIUM CL ER 20 MEQ TAB: 20 | 30 days supply | Qty: 30 | Fill #1

## 2016-04-09 NOTE — Telephone Encounter (Signed)
Advised refill ready for pickup 

## 2016-04-09 NOTE — Telephone Encounter (Signed)
Ativan ready for pick up

## 2016-05-08 ENCOUNTER — Telehealth: Payer: Self-pay | Admitting: Family Medicine

## 2016-05-08 NOTE — Telephone Encounter (Signed)
Patient needs lorazepam and fluroxetine. Patient needs 30 day of fluroxetine Please follow up.  Patient would like to use pharmacy: [facility] [place], [place]

## 2016-05-09 ENCOUNTER — Other Ambulatory Visit: Payer: Self-pay | Admitting: Family Medicine

## 2016-05-09 ENCOUNTER — Other Ambulatory Visit: Payer: Self-pay | Admitting: Pharmacist

## 2016-05-09 MED ORDER — FLUOXETINE HCL 20 MG PO CAPS: 40.0000 mg | ORAL_CAPSULE | Freq: Every day | ORAL | 5 refills | Status: DC

## 2016-05-09 NOTE — Telephone Encounter (Signed)
Please inform patient, lorazepam was refilled on 04/09/16 with 2 refills, 3 month supply Next refill due 07/10/16 Fluoxetine refill sent in to [facility] in [facility]

## 2016-07-10 ENCOUNTER — Telehealth: Payer: Self-pay | Admitting: Family Medicine

## 2016-07-10 DIAGNOSIS — F329 Major depressive disorder, single episode, unspecified: Secondary | ICD-10-CM

## 2016-07-10 DIAGNOSIS — Z9861 Coronary angioplasty status: Secondary | ICD-10-CM

## 2016-07-10 DIAGNOSIS — F419 Anxiety disorder, unspecified: Principal | ICD-10-CM

## 2016-07-10 DIAGNOSIS — F32A Depression, unspecified: Secondary | ICD-10-CM

## 2016-07-10 DIAGNOSIS — I2510 Atherosclerotic heart disease of native coronary artery without angina pectoris: Secondary | ICD-10-CM

## 2016-07-12 NOTE — Telephone Encounter (Signed)
reviewed [place] controlled substance database, No inappropriate prescribing noted  Refilled ativan  Rx ready for pick up

## 2016-07-15 NOTE — Telephone Encounter (Signed)
Contacted pt to inform her rx for ativan was ready for pick up was unable to lvm

## 2016-07-19 MED ORDER — CLOPIDOGREL BISULFATE 75 MG PO TABS: 75.0000 mg | ORAL_TABLET | Freq: Every day | ORAL | 0 refills | Status: DC

## 2016-07-19 NOTE — Telephone Encounter (Signed)
Clopidogrel refilled

## 2016-07-19 NOTE — Telephone Encounter (Signed)
Medication Refill: Plavix   Pt states she has been out of medication for 4 days   Patient would like to use pharmacy: [facility], [place]

## 2016-08-09 ENCOUNTER — Other Ambulatory Visit: Payer: Self-pay | Admitting: Family Medicine

## 2016-08-09 DIAGNOSIS — I2510 Atherosclerotic heart disease of native coronary artery without angina pectoris: Secondary | ICD-10-CM

## 2016-08-09 DIAGNOSIS — Z9861 Coronary angioplasty status: Principal | ICD-10-CM

## 2016-08-13 NOTE — Telephone Encounter (Signed)
Called patient to ask if she would be follow up here are has moved to [facility]  and will establish with a new primary No answer Requested a call back

## 2017-05-31 DIAGNOSIS — I252 Old myocardial infarction: Secondary | ICD-10-CM | POA: Diagnosis not present

## 2017-05-31 DIAGNOSIS — R0789 Other chest pain: Secondary | ICD-10-CM | POA: Diagnosis not present

## 2017-05-31 DIAGNOSIS — E785 Hyperlipidemia, unspecified: Secondary | ICD-10-CM | POA: Diagnosis not present

## 2017-05-31 DIAGNOSIS — E1165 Type 2 diabetes mellitus with hyperglycemia: Secondary | ICD-10-CM | POA: Diagnosis not present

## 2017-05-31 DIAGNOSIS — F17210 Nicotine dependence, cigarettes, uncomplicated: Secondary | ICD-10-CM | POA: Diagnosis not present

## 2017-05-31 DIAGNOSIS — J449 Chronic obstructive pulmonary disease, unspecified: Secondary | ICD-10-CM | POA: Diagnosis not present

## 2017-05-31 DIAGNOSIS — I2510 Atherosclerotic heart disease of native coronary artery without angina pectoris: Secondary | ICD-10-CM | POA: Diagnosis not present

## 2017-05-31 DIAGNOSIS — E039 Hypothyroidism, unspecified: Secondary | ICD-10-CM | POA: Diagnosis not present

## 2017-05-31 DIAGNOSIS — I10 Essential (primary) hypertension: Secondary | ICD-10-CM | POA: Diagnosis not present

## 2019-11-13 ENCOUNTER — Emergency Department (HOSPITAL_COMMUNITY)
Admission: EM | Admit: 2019-11-13 | Discharge: 2019-11-13 | Disposition: A | Discharge status: Home / Self Care | Payer: Medicare Other | Attending: Emergency Medicine | Admitting: Emergency Medicine

## 2019-11-13 ENCOUNTER — Encounter (HOSPITAL_COMMUNITY): Payer: Self-pay | Admitting: Emergency Medicine

## 2019-11-13 ENCOUNTER — Other Ambulatory Visit: Payer: Self-pay

## 2019-11-13 DIAGNOSIS — I2510 Atherosclerotic heart disease of native coronary artery without angina pectoris: Secondary | ICD-10-CM | POA: Diagnosis not present

## 2019-11-13 DIAGNOSIS — Z7982 Long term (current) use of aspirin: Secondary | ICD-10-CM | POA: Insufficient documentation

## 2019-11-13 DIAGNOSIS — Z79899 Other long term (current) drug therapy: Secondary | ICD-10-CM | POA: Diagnosis not present

## 2019-11-13 DIAGNOSIS — G8929 Other chronic pain: Secondary | ICD-10-CM | POA: Diagnosis not present

## 2019-11-13 DIAGNOSIS — Z87891 Personal history of nicotine dependence: Secondary | ICD-10-CM | POA: Insufficient documentation

## 2019-11-13 DIAGNOSIS — M5440 Lumbago with sciatica, unspecified side: Secondary | ICD-10-CM | POA: Insufficient documentation

## 2019-11-13 DIAGNOSIS — E119 Type 2 diabetes mellitus without complications: Secondary | ICD-10-CM | POA: Diagnosis not present

## 2019-11-13 DIAGNOSIS — M545 Low back pain: Secondary | ICD-10-CM | POA: Diagnosis present

## 2019-11-13 DIAGNOSIS — Z794 Long term (current) use of insulin: Secondary | ICD-10-CM | POA: Diagnosis not present

## 2019-11-13 DIAGNOSIS — E039 Hypothyroidism, unspecified: Secondary | ICD-10-CM | POA: Diagnosis not present

## 2019-11-13 DIAGNOSIS — I10 Essential (primary) hypertension: Secondary | ICD-10-CM | POA: Diagnosis not present

## 2019-11-13 MED ORDER — OXYCODONE-ACETAMINOPHEN 5-325 MG PO TABS
2.0000 | ORAL_TABLET | Freq: Once | ORAL | Status: AC
  Administered 2019-11-13: 2 via ORAL
  Filled 2019-11-13: qty 2

## 2019-11-13 MED ORDER — DICLOFENAC SODIUM 1 % EX GEL: 2.0000 g | Freq: Four times a day (QID) | CUTANEOUS | 0 refills | Status: AC

## 2019-11-13 NOTE — Discharge Instructions (Signed)
Your back pain is likely due to degenerative disc disease causing pressure on your nerve.  It will be beneficial to follow up with a pain management specialist for further care.  Follow up with your back specialist as previously schedule.  Use voltaren gel to affected area for comfort.

## 2019-11-13 NOTE — ED Provider Notes (Signed)
[place] [facility] EMERGENCY DEPARTMENT Provider Note   CSN: [id number] Arrival date & time: 11/13/19  1409     History Chief Complaint  Patient presents with  . Weakness  . Numbness    Jacqueline Munoz is a 59 y.o. female.  The history is provided by the patient and medical records. No language interpreter was used.  Weakness    59 year old female with history of chronic back pain, fibromyalgia, diabetes, hypertension, complaining of leg weakness and back pain.  Patient states she has history of recurrent back pain.  She was doing fine however for the past 2 to 3weeks pain returns.  She endorsed sharp shooting pain from her lower back, radiates to both lower legs, with pains in both hips and both knees.  She also endorsed numbness sensation to her thigh bilaterally.  She is having difficulty ambulating.  Requiring a walker to move about.  She afraid she may fall at home.  She lives with 90 year old mother and does not think she has enough family support.  She denies fever or chills bowel bladder incontinence or saddle anesthesia and denies any rash.  Patient states she was seen at [facility] 2 weeks ago for her symptoms.  States she had MRI of her back performed and was told that she may need surgery.  She is scheduled to follow-up with a specialist in 2 weeks but states she cannot wait that long.  Furthermore, she report medication that was prescribed including tramadol, muscle relaxant, and Voltaren gel provide minimal relief.  She denies any recent injury, no history of IV drug use.  Past Medical History:  Diagnosis Date  . Arthritis   . CAD S/P percutaneous coronary angioplasty    a. NSTEMI: s/p DES x2 LAD and DES to bifurcation of OM1 and mid to distal LCx (11/16/14) b. UA/ACS:  overlapping DES x1 to LAD for de novo 99% focal lesion in LAD (12/02/2014)  . Chronic back pain   . Chronic depression   . Coronary artery chronic total occlusion: 100% RCA CTO 11/16/2014   a.  Prox RCA to Mid RCA lesion, 100% stenosed.   . Diabetes mellitus, type 2 ([place])   . Essential hypertension   . Fibromyalgia   . History of stomach ulcers   . History of tobacco abuse   . HLD (hyperlipidemia)   . Hypothyroid   . Kidney stones   . OSA (obstructive sleep apnea)    a. does not wear CPAP  . Type IVa MI - Peri-PCI 11/17/2014   Prolonged ischemia during bifurcation PCI of  m-d Cx-OM1 with minicrush technique    Patient Active Problem List   Diagnosis Date Noted  . Chest pain 02/15/2016  . Tobacco dependence 02/15/2016  . OSA (obstructive sleep apnea) 02/15/2016  . Abdominal pain, epigastric 12/08/2015  . Mild tetrahydrocannabinol (THC) abuse 11/17/2015  . Subcutaneous nodules 10/17/2015  . Herpes zoster 10/17/2015  . History of Non-ST elevated myocardial infarction (non-STEMI) ([place]) 08/27/2015  . Painful lumpy left breast 08/21/2015  . Vaginal polyp 08/21/2015  . Vaginal bleeding 08/21/2015  . Costochondritis 07/20/2015  . Demand ischemia ([place]) 06/15/2015  . Fibromyalgia   . Diabetes mellitus, type 2 ([place])   . CAD S/P percutaneous coronary angioplasty   . Chronic diastolic heart failure ([place])   . 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 Cephalalgia   . Mixed hyperlipidemia   . Upper airway cough syndrome 03/25/2015  . Post herpetic neuralgia T6/8 on L  03/25/2015  .  Obesity 03/25/2015  . Incidental lung nodule, > 19m and < 814m09/19/2016  . Leg swelling 03/20/2015  . Fatigue 02/02/2015  . Lumbar back pain 01/20/2015  . Vitamin D deficiency 01/06/2015  . Intercostal muscle pain 01/05/2015  . Chronic pain syndrome 01/05/2015  . Vertigo, constant 12/22/2014  . Voice hoarseness 12/14/2014  . Temporomandibular joint disorders 12/07/2014  . Hypokalemia 12/02/2014  . Anxiety and depression 11/23/2014  . Knee pain, right 11/23/2014  . Abdominal pain, left upper quadrant 11/23/2014  . Essential hypertension 11/15/2014  . Hypothyroid 11/15/2014     Past Surgical History:  Procedure Laterality Date  . CARDIAC CATHETERIZATION N/A 11/16/2014   Procedure: Left Heart Cath and Coronary Angiography;  Surgeon: Peter M JoMartiniqueMD;  [facility];  Service: Cardiovascular;  RCA 100% CTO, m-dLAD 90%, m-dCx 95%, OM1 90%  . CARDIAC CATHETERIZATION  11/16/2014   Procedure: Coronary Stent Intervention;  Surgeon: Peter M JoMartiniqueMD;  [facility];  Service: Cardiovascular;;m-d LAD overlapping 3.0 x 16 & 2.75 x 16 Promus P DES; OM1-dCx Minicrush bifurcation (dCx 2.25 x 38 Synergy DES crushed with 2.75 x 24 Synergy DES in OM1)   . CARDIAC CATHETERIZATION N/A 12/02/2014   Procedure: Left Heart Cath and Coronary Angiography;  Surgeon: DaLeonie ManMD;  [facility];  Service: Cardiovascular;  Previous Stents in LAD & Cx-OM patent, mLAD prox to stents 99%  . CARDIAC CATHETERIZATION N/A 12/02/2014   Procedure: Coronary Stent Intervention;  Surgeon: DaLeonie ManMD;  [facility];  Service: Cardiovascular;  3rd overlapping (prox) mLAD Promus Premier DES 3.0 x 20 (3.25 mm)  . CESAREAN SECTION  2000  . INNER EAR SURGERY Bilateral "several"  . TONSILLECTOMY    . TOTAL ABDOMINAL HYSTERECTOMY  ~ 2008  . TRANSTHORACIC ECHOCARDIOGRAM  5/19 & 6/ 2016   a) EF 65-70%, mild LVH, Gr 1 DD; b) EF 60% -ron RWMA, no valve lesions  . TUBAL LIGATION  2000     OB History   No obstetric history on file.     Family History  Problem Relation Age of Onset  . Hyperlipidemia Mother   . Sudden death Father   . Emphysema Maternal Grandmother        smoked    Social History   Tobacco Use  . Smoking status: Former Smoker    Packs/day: 1.50    Years: 39.00    Pack years: 58.50    Types: Cigarettes    Quit date: 11/10/2014    Years since quitting: 5.0  . Smokeless tobacco: Never Used  . Tobacco comment: started back but quit again 4 days ago, requests patch  Substance Use Topics  . Alcohol use: No     Alcohol/week: 0.0 standard drinks    Comment: 11/15/2014 "might have a few drinks/yr"  . Drug use: Yes    Types: Marijuana    Comment: last marijuana use 02/14/16    Home Medications Prior to Admission medications   Medication Sig Start Date End Date Taking? Authorizing Provider  acetaminophen (TYLENOL) 500 MG chewable tablet Chew 1,000 mg by mouth every 6 (six) hours as needed for pain.    [provider]  aspirin 81 MG EC tablet Take 1 tablet (81 mg total) by mouth daily. 02/23/16   NoBrayton CavesPA-C  atorvastatin (LIPITOR) 80 MG tablet Take 1 tablet (80 mg total) by mouth daily. 04/04/16   FuBoykin NearingMD  carvedilol (  COREG) 3.125 MG tablet TAKE 1 TABLET BY MOUTH 2 TIMES DAILY WITH A MEAL. 02/23/16   Brayton Caves, PA-C  clopidogrel (PLAVIX) 75 MG tablet TAKE ONE TABLET BY MOUTH ONCE DAILY 08/13/16   Boykin Nearing, MD  FLUoxetine (PROZAC) 20 MG capsule Take 2 capsules (40 mg total) by mouth daily. 05/09/16   Funches, Adriana Mccallum, MD  furosemide (LASIX) 40 MG tablet Take 1 tablet (40 mg total) by mouth daily. 02/23/16   Brayton Caves, PA-C  glucose monitoring kit (FREESTYLE) monitoring kit 1 each by Does not apply route 4 (four) times daily - after meals and at bedtime. 1 month Diabetic Testing Supplies for QAC-QHS accuchecks. 11/16/14   Debbe Odea, MD  insulin aspart (NOVOLOG) 100 UNIT/ML injection Inject 2-10 Units into the skin 3 (three) times daily as needed for high blood sugar (as needed for blood sugar coverage-sliding scale). 02/23/16   Brayton Caves, PA-C  Insulin Detemir (LEVEMIR) 100 UNIT/ML Pen INJECT 35 UNITS INTO THE SKIN AT BEDTIME. 02/23/16   Zettie Pho S, PA-C  Insulin Syringe-Needle U-100 (TRUEPLUS INSULIN SYRINGE) 30G X 5/16" 0.5 ML MISC 1 each by Does not apply route 4 (four) times daily. 02/23/16   Brayton Caves, PA-C  isosorbide mononitrate (IMDUR) 30 MG 24 hr tablet Take 1 tablet (30 mg total) by mouth daily. 02/23/16   Brayton Caves, PA-C   levothyroxine (SYNTHROID, LEVOTHROID) 200 MCG tablet Take 1 tablet (200 mcg total) by mouth daily before breakfast. 02/23/16   Zettie Pho S, PA-C  LORazepam (ATIVAN) 1 MG tablet TAKE ONE TABLET BY MOUTH ONCE DAILY 07/12/16   Funches, Adriana Mccallum, MD  metFORMIN (GLUCOPHAGE) 500 MG tablet TAKE 1 TABLET BY MOUTH 2 TIMES DAILY WITH A MEAL. 02/23/16   Brayton Caves, PA-C  nicotine (NICODERM CQ - DOSED IN MG/24 HOURS) 14 mg/24hr patch Place 1 patch (14 mg total) onto the skin at bedtime. 02/17/16   Johnson, Clanford L, MD  nitroGLYCERIN (NITROSTAT) 0.4 MG SL tablet Place 0.4 mg under the tongue every 5 (five) minutes as needed for chest pain (may take three doses by mouth per day as needed).    [provider]  ondansetron (ZOFRAN ODT) 8 MG disintegrating tablet Take 1 tablet (8 mg total) by mouth every 8 (eight) hours as needed for nausea or vomiting. 12/08/15   Funches, Adriana Mccallum, MD  Potassium Chloride ER 20 MEQ TBCR Take 40 mEq by mouth 2 (two) times daily. 02/23/16 02/25/16  Brayton Caves, PA-C  ranitidine (ZANTAC) 150 MG tablet Take 1 tablet (150 mg total) by mouth 2 (two) times daily. 02/23/16   Brayton Caves, PA-C    Allergies    Corticosteroids, Lyrica [pregabalin], Penicillins, Amitriptyline, and Gabapentin  Review of Systems   Review of Systems  Neurological: Positive for weakness.  All other systems reviewed and are negative.   Physical Exam Updated Vital Signs BP (!) 146/87 (BP Location: Right Arm)   Pulse (!) 57   Temp 98.3 F (36.8 C) (Oral)   SpO2 97%   Physical Exam Vitals and nursing note reviewed.  Constitutional:      General: She is not in acute distress.    Appearance: She is well-developed. She is obese.     Comments: Obese female laying on her right lateral decubitus position appears uncomfortable but nontoxic.  HENT:     Head: Atraumatic.  Eyes:     Conjunctiva/sclera: Conjunctivae normal.  Abdominal:     Palpations: Abdomen is soft.  Tenderness:  There is no abdominal tenderness.  Musculoskeletal:        General: Tenderness (Tenderness to lumbar bilateral paralumbar spinal muscle on palpation no overlying skin changes, no crepitus or step-off.) present.     Cervical back: Neck supple.  Skin:    Findings: No rash.  Neurological:     Mental Status: She is alert and oriented to person, place, and time.     Comments: 4 out of 5 strength to bilateral lower extremities with poor effort.  Patellar deep tendon reflexes intact bilaterally, no foot drop.  Subjective decrease sensation to medial thigh bilaterally with normal flexion extensions of hip.  Psychiatric:        Mood and Affect: Mood normal.     ED Results / Procedures / Treatments   Labs (all labs ordered are listed, but only abnormal results are displayed) Labs Reviewed - No data to display  EKG None  Radiology No results found.  Procedures Procedures (including critical care time)  Medications Ordered in ED Medications  oxyCODONE-acetaminophen (PERCOCET/ROXICET) 5-325 MG per tablet 2 tablet (has no administration in time range)    ED Course  I have reviewed the triage vital signs and the nursing notes.  Pertinent labs & imaging results that were available during my care of the patient were reviewed by me and considered in my medical decision making (see chart for details).    MDM Rules/Calculators/A&P                      BP (!) 146/87 (BP Location: Right Arm)   Pulse (!) 57   Temp 98.3 F (36.8 C) (Oral)   SpO2 97%   Final Clinical Impression(s) / ED Diagnoses Final diagnoses:  Chronic bilateral low back pain with sciatica, sciatica laterality unspecified    Rx / DC Orders ED Discharge Orders         Ordered    diclofenac Sodium (VOLTAREN) 1 % GEL  4 times daily     11/13/19 1841         5:13 PM Patient here with acute on chronic low back pain, radicular leg pain as well as subjective paresthesia of bilateral thigh anteriorly.  Patient  recently had thoracic and lumbar spine MRI performed less than a month ago which demonstrates no evidence of cord compression or cauda equina.  There are mild to moderate canal stenosis at T6 and T7 due to degenerative disc bulge and ligamentum flavum calcification as well as minimal disc bulge at L4-L5 with mild mass-effect on bilateral descending L5 nerve root.  There is a small right paracentral annular tear.  These findings certainly support patients current pain.  Patient is allergic to prednisone.  She also report medication prescribed to her which includes Flexeril, tramadol, and NSAIDs are not providing adequate relief.  I felt patient would likely benefit from a pain specialist for further management of her chronic pain.  I will also provide patient with a referral to our back specialist to follow-up as needed.  However, patient able to ambulate, no red flags, and is stable for discharge.  Will provide a dose of pain medication here.   Domenic Moras, PA-C 11/13/19 1844    Lucrezia Starch, MD 11/15/19 [phone]

## 2019-11-13 NOTE — ED Notes (Signed)
Pt ambulatory to restroom with steady gait. Pt able to get up and get back into bed without assistance

## 2019-11-13 NOTE — ED Triage Notes (Addendum)
Pt reports ongoing bilateral leg weakness and numbness to anterior thighs and anterior shins. Does endorse back pain. Denies loss of bladder or bowels, no traumatic falls, but has fallen due to leg weakness. States she was seen in [place] and had MRI that showed a compressed nerve. She states symptoms are just progressing, cannot see neuro for 2 more weeks.

## 2020-12-15 ENCOUNTER — Emergency Department (HOSPITAL_COMMUNITY): Payer: Medicare Other

## 2020-12-15 ENCOUNTER — Inpatient Hospital Stay (HOSPITAL_COMMUNITY)
Admission: EM | Admit: 2020-12-15 | Discharge: 2020-12-18 | DRG: 287 | Disposition: A | Discharge status: Home / Self Care | Payer: Medicare Other | Attending: Cardiology | Admitting: Cardiology

## 2020-12-15 ENCOUNTER — Encounter (HOSPITAL_COMMUNITY): Payer: Self-pay | Admitting: *Deleted

## 2020-12-15 ENCOUNTER — Other Ambulatory Visit: Payer: Self-pay

## 2020-12-15 DIAGNOSIS — J45909 Unspecified asthma, uncomplicated: Secondary | ICD-10-CM | POA: Diagnosis present

## 2020-12-15 DIAGNOSIS — E1169 Type 2 diabetes mellitus with other specified complication: Secondary | ICD-10-CM | POA: Diagnosis present

## 2020-12-15 DIAGNOSIS — E032 Hypothyroidism due to medicaments and other exogenous substances: Secondary | ICD-10-CM

## 2020-12-15 DIAGNOSIS — M549 Dorsalgia, unspecified: Secondary | ICD-10-CM | POA: Diagnosis present

## 2020-12-15 DIAGNOSIS — F32A Depression, unspecified: Secondary | ICD-10-CM | POA: Diagnosis present

## 2020-12-15 DIAGNOSIS — Y831 Surgical operation with implant of artificial internal device as the cause of abnormal reaction of the patient, or of later complication, without mention of misadventure at the time of the procedure: Secondary | ICD-10-CM | POA: Diagnosis present

## 2020-12-15 DIAGNOSIS — Y92230 Patient room in hospital as the place of occurrence of the external cause: Secondary | ICD-10-CM | POA: Diagnosis not present

## 2020-12-15 DIAGNOSIS — Z8711 Personal history of peptic ulcer disease: Secondary | ICD-10-CM

## 2020-12-15 DIAGNOSIS — Z87442 Personal history of urinary calculi: Secondary | ICD-10-CM

## 2020-12-15 DIAGNOSIS — Z20822 Contact with and (suspected) exposure to covid-19: Secondary | ICD-10-CM | POA: Diagnosis present

## 2020-12-15 DIAGNOSIS — Z955 Presence of coronary angioplasty implant and graft: Secondary | ICD-10-CM

## 2020-12-15 DIAGNOSIS — I25110 Atherosclerotic heart disease of native coronary artery with unstable angina pectoris: Secondary | ICD-10-CM | POA: Diagnosis not present

## 2020-12-15 DIAGNOSIS — G4733 Obstructive sleep apnea (adult) (pediatric): Secondary | ICD-10-CM | POA: Diagnosis present

## 2020-12-15 DIAGNOSIS — Z794 Long term (current) use of insulin: Secondary | ICD-10-CM

## 2020-12-15 DIAGNOSIS — Z9861 Coronary angioplasty status: Secondary | ICD-10-CM

## 2020-12-15 DIAGNOSIS — G8929 Other chronic pain: Secondary | ICD-10-CM | POA: Diagnosis present

## 2020-12-15 DIAGNOSIS — R079 Chest pain, unspecified: Secondary | ICD-10-CM

## 2020-12-15 DIAGNOSIS — I200 Unstable angina: Secondary | ICD-10-CM | POA: Diagnosis not present

## 2020-12-15 DIAGNOSIS — Z7902 Long term (current) use of antithrombotics/antiplatelets: Secondary | ICD-10-CM

## 2020-12-15 DIAGNOSIS — Z79899 Other long term (current) drug therapy: Secondary | ICD-10-CM

## 2020-12-15 DIAGNOSIS — Z9851 Tubal ligation status: Secondary | ICD-10-CM

## 2020-12-15 DIAGNOSIS — I2510 Atherosclerotic heart disease of native coronary artery without angina pectoris: Secondary | ICD-10-CM

## 2020-12-15 DIAGNOSIS — T463X5A Adverse effect of coronary vasodilators, initial encounter: Secondary | ICD-10-CM | POA: Diagnosis not present

## 2020-12-15 DIAGNOSIS — I110 Hypertensive heart disease with heart failure: Secondary | ICD-10-CM | POA: Diagnosis present

## 2020-12-15 DIAGNOSIS — I252 Old myocardial infarction: Secondary | ICD-10-CM

## 2020-12-15 DIAGNOSIS — Z825 Family history of asthma and other chronic lower respiratory diseases: Secondary | ICD-10-CM

## 2020-12-15 DIAGNOSIS — E119 Type 2 diabetes mellitus without complications: Secondary | ICD-10-CM | POA: Diagnosis present

## 2020-12-15 DIAGNOSIS — Z881 Allergy status to other antibiotic agents status: Secondary | ICD-10-CM

## 2020-12-15 DIAGNOSIS — Z7984 Long term (current) use of oral hypoglycemic drugs: Secondary | ICD-10-CM

## 2020-12-15 DIAGNOSIS — T82855A Stenosis of coronary artery stent, initial encounter: Secondary | ICD-10-CM | POA: Diagnosis present

## 2020-12-15 DIAGNOSIS — I10 Essential (primary) hypertension: Secondary | ICD-10-CM | POA: Diagnosis present

## 2020-12-15 DIAGNOSIS — Z79890 Hormone replacement therapy (postmenopausal): Secondary | ICD-10-CM

## 2020-12-15 DIAGNOSIS — Z7982 Long term (current) use of aspirin: Secondary | ICD-10-CM

## 2020-12-15 DIAGNOSIS — E782 Mixed hyperlipidemia: Secondary | ICD-10-CM | POA: Diagnosis present

## 2020-12-15 DIAGNOSIS — M797 Fibromyalgia: Secondary | ICD-10-CM | POA: Diagnosis present

## 2020-12-15 DIAGNOSIS — E785 Hyperlipidemia, unspecified: Secondary | ICD-10-CM | POA: Diagnosis present

## 2020-12-15 DIAGNOSIS — I2582 Chronic total occlusion of coronary artery: Secondary | ICD-10-CM | POA: Diagnosis present

## 2020-12-15 DIAGNOSIS — I5032 Chronic diastolic (congestive) heart failure: Secondary | ICD-10-CM | POA: Diagnosis present

## 2020-12-15 DIAGNOSIS — Z87891 Personal history of nicotine dependence: Secondary | ICD-10-CM

## 2020-12-15 DIAGNOSIS — G4440 Drug-induced headache, not elsewhere classified, not intractable: Secondary | ICD-10-CM | POA: Diagnosis not present

## 2020-12-15 DIAGNOSIS — E039 Hypothyroidism, unspecified: Secondary | ICD-10-CM | POA: Diagnosis present

## 2020-12-15 DIAGNOSIS — Z83438 Family history of other disorder of lipoprotein metabolism and other lipidemia: Secondary | ICD-10-CM

## 2020-12-15 DIAGNOSIS — I471 Supraventricular tachycardia: Secondary | ICD-10-CM | POA: Diagnosis present

## 2020-12-15 DIAGNOSIS — Z888 Allergy status to other drugs, medicaments and biological substances status: Secondary | ICD-10-CM

## 2020-12-15 DIAGNOSIS — Z880 Allergy status to penicillin: Secondary | ICD-10-CM

## 2020-12-15 DIAGNOSIS — M1990 Unspecified osteoarthritis, unspecified site: Secondary | ICD-10-CM | POA: Diagnosis present

## 2020-12-15 DIAGNOSIS — Z91014 Allergy to mammalian meats: Secondary | ICD-10-CM

## 2020-12-15 LAB — BASIC METABOLIC PANEL
Anion gap: 11 (ref 5–15)
BUN: 12 mg/dL (ref 6–20)
CO2: 24 mmol/L (ref 22–32)
Calcium: 9.2 mg/dL (ref 8.9–10.3)
Chloride: 101 mmol/L (ref 98–111)
Creatinine, Ser: 0.7 mg/dL (ref 0.44–1.00)
GFR, Estimated: >60 mL/min (ref >60)
Glucose, Bld: 196 mg/dL — ABNORMAL HIGH (ref 70–99)
Potassium: 3.9 mmol/L (ref 3.5–5.1)
Sodium: 136 mmol/L (ref 135–145)

## 2020-12-15 LAB — CBC WITH DIFFERENTIAL/PLATELET
Abs Immature Granulocytes: 0.11 10*3/uL — ABNORMAL HIGH (ref 0.00–0.07)
Basophils Absolute: 0.1 10*3/uL (ref 0.0–0.1)
Basophils Relative: 1 %
Eosinophils Absolute: 0.1 10*3/uL (ref 0.0–0.5)
Eosinophils Relative: 1 %
HCT: 41.5 % (ref 36.0–46.0)
Hemoglobin: 13.3 g/dL (ref 12.0–15.0)
Immature Granulocytes: 1 %
Lymphocytes Relative: 30 %
Lymphs Abs: 3 10*3/uL (ref 0.7–4.0)
MCH: 29.1 pg (ref 26.0–34.0)
MCHC: 32 g/dL (ref 30.0–36.0)
MCV: 90.8 fL (ref 80.0–100.0)
Monocytes Absolute: 0.5 10*3/uL (ref 0.1–1.0)
Monocytes Relative: 5 %
Neutro Abs: 6.2 10*3/uL (ref 1.7–7.7)
Neutrophils Relative %: 62 %
Platelets: 270 10*3/uL (ref 150–400)
RBC: 4.57 MIL/uL (ref 3.87–5.11)
RDW: 14.2 % (ref 11.5–15.5)
WBC: 10.2 10*3/uL (ref 4.0–10.5)
nRBC: 0 % (ref 0.0–0.2)

## 2020-12-15 LAB — TROPONIN I (HIGH SENSITIVITY): Troponin I (High Sensitivity): 5 ng/L (ref <18)

## 2020-12-15 NOTE — ED Provider Notes (Signed)
Emergency Medicine Provider Triage Evaluation Note  Samya Whitehorn , a 60 y.o. female  was evaluated in triage.  Pt complains of chest pain and feelings of weakness in her bilateral arms. She states that she is able to move her arms legs feel weak.  She has had a headache over the past 2 to 3 days also..  Review of Systems  Positive: Chest pain, weakness Negative: Fevers  Physical Exam  BP 108/82 (BP Location: Left Arm)   Pulse 77   Temp 98.5 F (36.9 C) (Oral)   Resp 18   Ht 5\' 2"  (1.575 m)   Wt 83.9 kg   SpO2 98%   BMI 33.84 kg/m  Gen:   Awake, no distress   Resp:  Normal effort  MSK:   Moves extremities without difficulty  Other:  Able to lift lateral arms without difficulty.  Speaks in full sentences without difficulty.  Medical Decision Making  Medically screening exam initiated at 9:00 PM.  Appropriate orders placed.  [name] was informed that the remainder of the evaluation will be completed by another provider, this initial triage assessment does not replace that evaluation, and the importance of remaining in the ED until their evaluation is complete.     Ollen Gross 12/15/20 2101    Truddie Hidden, MD 12/15/20 2232

## 2020-12-15 NOTE — ED Triage Notes (Signed)
The pt is c/o chest pain  dizziness and bi-lateral arms weakness  since yesterday  c/o a headache also

## 2020-12-16 ENCOUNTER — Observation Stay (HOSPITAL_COMMUNITY): Payer: Medicare Other

## 2020-12-16 DIAGNOSIS — I200 Unstable angina: Secondary | ICD-10-CM | POA: Diagnosis not present

## 2020-12-16 DIAGNOSIS — R079 Chest pain, unspecified: Secondary | ICD-10-CM | POA: Diagnosis not present

## 2020-12-16 HISTORY — PX: TRANSTHORACIC ECHOCARDIOGRAM: SHX275

## 2020-12-16 LAB — RESP PANEL BY RT-PCR (FLU A&B, COVID) ARPGX2
Influenza A by PCR: NEGATIVE
Influenza B by PCR: NEGATIVE
SARS Coronavirus 2 by RT PCR: NEGATIVE

## 2020-12-16 LAB — ECHOCARDIOGRAM COMPLETE
Area-P 1/2: 2.66 cm2
Height: 62 in
S' Lateral: 2.9 cm
Weight: 2960 oz

## 2020-12-16 LAB — CBC
HCT: 44.6 % (ref 36.0–46.0)
Hemoglobin: 14.1 g/dL (ref 12.0–15.0)
MCH: 28.7 pg (ref 26.0–34.0)
MCHC: 31.6 g/dL (ref 30.0–36.0)
MCV: 90.8 fL (ref 80.0–100.0)
Platelets: 247 10*3/uL (ref 150–400)
RBC: 4.91 MIL/uL (ref 3.87–5.11)
RDW: 14.2 % (ref 11.5–15.5)
WBC: 9.5 10*3/uL (ref 4.0–10.5)
nRBC: 0 % (ref 0.0–0.2)

## 2020-12-16 LAB — GLUCOSE, CAPILLARY
Glucose-Capillary: 208 mg/dL — ABNORMAL HIGH (ref 70–99)
Glucose-Capillary: 275 mg/dL — ABNORMAL HIGH (ref 70–99)

## 2020-12-16 LAB — CREATININE, SERUM
Creatinine, Ser: 0.76 mg/dL (ref 0.44–1.00)
GFR, Estimated: >60 mL/min (ref >60)

## 2020-12-16 LAB — HIV ANTIBODY (ROUTINE TESTING W REFLEX): HIV Screen 4th Generation wRfx: NONREACTIVE

## 2020-12-16 LAB — TROPONIN I (HIGH SENSITIVITY): Troponin I (High Sensitivity): 5 ng/L (ref <18)

## 2020-12-16 MED ORDER — ATORVASTATIN CALCIUM 40 MG PO TABS
40.0000 mg | ORAL_TABLET | Freq: Every day | ORAL | Status: DC
  Administered 2020-12-16 – 2020-12-17 (×2): 40 mg via ORAL
  Filled 2020-12-16 (×2): qty 1

## 2020-12-16 MED ORDER — HEPARIN SODIUM (PORCINE) 5000 UNIT/ML IJ SOLN
5000.0000 [IU] | Freq: Three times a day (TID) | INTRAMUSCULAR | Status: DC
  Administered 2020-12-16 – 2020-12-17 (×3): 5000 [IU] via SUBCUTANEOUS
  Filled 2020-12-16 (×3): qty 1

## 2020-12-16 MED ORDER — SODIUM CHLORIDE 0.9 % WEIGHT BASED INFUSION: 1.0000 mL/kg/h | INTRAVENOUS | Status: DC

## 2020-12-16 MED ORDER — HYDROCODONE-ACETAMINOPHEN 5-325 MG PO TABS
1.0000 | ORAL_TABLET | Freq: Four times a day (QID) | ORAL | Status: DC | PRN
  Administered 2020-12-16 – 2020-12-18 (×9): 1 via ORAL
  Filled 2020-12-16 (×9): qty 1

## 2020-12-16 MED ORDER — ASPIRIN 81 MG PO CHEW
81.0000 mg | CHEWABLE_TABLET | Freq: Every day | ORAL | Status: DC
  Administered 2020-12-16 – 2020-12-17 (×2): 81 mg via ORAL
  Filled 2020-12-16 (×2): qty 1

## 2020-12-16 MED ORDER — NITROGLYCERIN IN D5W 200-5 MCG/ML-% IV SOLN
0.0000 ug/min | INTRAVENOUS | Status: DC
  Administered 2020-12-16: 5 ug/min via INTRAVENOUS
  Filled 2020-12-16: qty 250

## 2020-12-16 MED ORDER — ONDANSETRON HCL 4 MG/2ML IJ SOLN: 4.0000 mg | Freq: Four times a day (QID) | INTRAMUSCULAR | Status: DC | PRN

## 2020-12-16 MED ORDER — CARVEDILOL 6.25 MG PO TABS
6.2500 mg | ORAL_TABLET | Freq: Two times a day (BID) | ORAL | Status: DC
  Administered 2020-12-16 – 2020-12-18 (×5): 6.25 mg via ORAL
  Filled 2020-12-16 (×5): qty 1

## 2020-12-16 MED ORDER — SODIUM CHLORIDE 0.9 % WEIGHT BASED INFUSION: 3.0000 mL/kg/h | INTRAVENOUS | Status: DC

## 2020-12-16 MED ORDER — LOSARTAN POTASSIUM 50 MG PO TABS
100.0000 mg | ORAL_TABLET | Freq: Every day | ORAL | Status: DC
  Administered 2020-12-17 – 2020-12-18 (×2): 100 mg via ORAL
  Filled 2020-12-16 (×3): qty 2

## 2020-12-16 MED ORDER — HYDROCODONE-ACETAMINOPHEN 5-325 MG PO TABS
1.0000 | ORAL_TABLET | Freq: Once | ORAL | Status: AC
  Administered 2020-12-16: 1 via ORAL
  Filled 2020-12-16: qty 1

## 2020-12-16 MED ORDER — NITROGLYCERIN 0.4 MG SL SUBL: 0.4000 mg | SUBLINGUAL_TABLET | SUBLINGUAL | Status: DC | PRN

## 2020-12-16 MED ORDER — ASPIRIN 81 MG PO CHEW: 81.0000 mg | CHEWABLE_TABLET | ORAL | Status: DC

## 2020-12-16 MED ORDER — SODIUM CHLORIDE 0.9 % IV SOLN: 250.0000 mL | INTRAVENOUS | Status: DC | PRN

## 2020-12-16 MED ORDER — INSULIN DETEMIR 100 UNIT/ML ~~LOC~~ SOLN
50.0000 [IU] | Freq: Two times a day (BID) | SUBCUTANEOUS | Status: DC
  Administered 2020-12-16 – 2020-12-18 (×4): 50 [IU] via SUBCUTANEOUS
  Filled 2020-12-16 (×5): qty 0.5

## 2020-12-16 MED ORDER — SODIUM CHLORIDE 0.9% FLUSH
3.0000 mL | Freq: Two times a day (BID) | INTRAVENOUS | Status: DC
  Administered 2020-12-16 – 2020-12-18 (×3): 3 mL via INTRAVENOUS

## 2020-12-16 MED ORDER — SODIUM CHLORIDE 0.9% FLUSH: 3.0000 mL | INTRAVENOUS | Status: DC | PRN

## 2020-12-16 MED ORDER — ISOSORBIDE MONONITRATE ER 30 MG PO TB24
30.0000 mg | ORAL_TABLET | Freq: Every day | ORAL | Status: DC
  Filled 2020-12-16: qty 1

## 2020-12-16 MED ORDER — INSULIN ASPART 100 UNIT/ML IJ SOLN
0.0000 [IU] | Freq: Three times a day (TID) | INTRAMUSCULAR | Status: DC
  Administered 2020-12-16 – 2020-12-17 (×2): 5 [IU] via SUBCUTANEOUS
  Administered 2020-12-17 (×2): 2 [IU] via SUBCUTANEOUS
  Administered 2020-12-18: 3 [IU] via SUBCUTANEOUS

## 2020-12-16 MED ORDER — ACETAMINOPHEN 325 MG PO TABS: 650.0000 mg | ORAL_TABLET | ORAL | Status: DC | PRN

## 2020-12-16 NOTE — ED Provider Notes (Signed)
[facility] EMERGENCY DEPARTMENT Provider Note   CSN: [id number] Arrival date & time: 12/15/20  2021     History Chief Complaint  Patient presents with   Chest Pain    Jacqueline Munoz is a 60 y.o. female.  Patient is a 60 year old female with an extensive cardiac history of CAD status post stents x8 most recent catheterization was 4 years ago in [place], diabetes, hypertension, ongoing marijuana use, hyperlipidemia, hypothyroidism who is presenting today with complaints of chest pain and arm weakness.  Patient reports the symptoms have been ongoing for the last 2 days.  Hit is a deep aching sensation across to her chest that then goes into bilateral arms.  It is made worse with exertion but also present at rest.  She has had an intermittent headache the last few days as well but denies any congestion, cough, pleuritic pain, fever.  She has had no shortness of breath, abdominal pain, nausea or vomiting.  She has been taking all of her medication and denies any medication changes.  She does admit that approximately 2 weeks ago she was in [facility] overnight for a asthma exacerbation that resolved after breathing treatments.  She is allergic to steroids and did not receive any and was using albuterol.  Since that time she does not feel that her breathing has been bad.  She was concerned about the specific pain because she reports that the way it has felt in the past when she needed a stent.  She plans on seeing Dr. Ellyn Hack next month as she recently moved back to this area.  She does take Plavix daily and has not missed any doses.  The history is provided by the patient and medical records.  Chest Pain     Past Medical History:  Diagnosis Date   Arthritis    CAD S/P percutaneous coronary angioplasty    a. NSTEMI: s/p DES x2 LAD and DES to bifurcation of OM1 and mid to distal LCx (11/16/14) b. UA/ACS:  overlapping DES x1 to LAD for de novo 99% focal lesion in LAD  (12/02/2014)   Chronic back pain    Chronic depression    Coronary artery chronic total occlusion: 100% RCA CTO 11/16/2014   a. Prox RCA to Mid RCA lesion, 100% stenosed.    Diabetes mellitus, type 2 ([place])    Essential hypertension    Fibromyalgia    History of stomach ulcers    History of tobacco abuse    HLD (hyperlipidemia)    Hypothyroid    Kidney stones    OSA (obstructive sleep apnea)    a. does not wear CPAP   Type IVa MI - Peri-PCI 11/17/2014   Prolonged ischemia during bifurcation PCI of  m-d Cx-OM1 with minicrush technique    Patient Active Problem List   Diagnosis Date Noted   Chest pain 02/15/2016   Tobacco dependence 02/15/2016   OSA (obstructive sleep apnea) 02/15/2016   Abdominal pain, epigastric 12/08/2015   Mild tetrahydrocannabinol (THC) abuse 11/17/2015   Subcutaneous nodules 10/17/2015   Herpes zoster 10/17/2015   History of Non-ST elevated myocardial infarction (non-STEMI) ([place]) 08/27/2015   Painful lumpy left breast 08/21/2015   Vaginal polyp 08/21/2015   Vaginal bleeding 08/21/2015   Costochondritis 07/20/2015   Demand ischemia ([place]) 06/15/2015   Fibromyalgia    Diabetes mellitus, type 2 (HCC)    CAD S/P percutaneous coronary angioplasty    Chronic diastolic heart failure (HCC)    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   Cephalalgia    Mixed hyperlipidemia    Upper airway cough syndrome 03/25/2015   Post herpetic neuralgia T6/8 on L  03/25/2015   Obesity 03/25/2015   Incidental lung nodule, > 79mm and < 74mm 03/20/2015   Leg swelling 03/20/2015   Fatigue 02/02/2015   Lumbar back pain 01/20/2015   Vitamin D deficiency 01/06/2015   Intercostal muscle pain 01/05/2015   Chronic pain syndrome 01/05/2015   Vertigo, constant 12/22/2014   Voice hoarseness 12/14/2014   Temporomandibular joint disorders 12/07/2014   Hypokalemia 12/02/2014   Anxiety and depression 11/23/2014   Knee pain, right 11/23/2014   Abdominal pain, left upper quadrant  11/23/2014   Essential hypertension 11/15/2014   Hypothyroid 11/15/2014    Past Surgical History:  Procedure Laterality Date   CARDIAC CATHETERIZATION N/A 11/16/2014   Procedure: Left Heart Cath and Coronary Angiography;  Surgeon: Peter M Martinique, MD;  Location: [place] [facility];  Service: Cardiovascular;  RCA 100% CTO, m-dLAD 90%, m-dCx 95%, OM1 90%   CARDIAC CATHETERIZATION  11/16/2014   Procedure: Coronary Stent Intervention;  Surgeon: Peter M Martinique, MD;  Location: [place] [facility];  Service: Cardiovascular;;m-d LAD overlapping 3.0 x 16 & 2.75 x 16 Promus P DES; OM1-dCx Minicrush bifurcation (dCx 2.25 x 38 Synergy DES crushed with 2.75 x 24 Synergy DES in OM1)    CARDIAC CATHETERIZATION N/A 12/02/2014   Procedure: Left Heart Cath and Coronary Angiography;  Surgeon: Leonie Man, MD;  Location: [place] [facility];  Service: Cardiovascular;  Previous Stents in LAD & Cx-OM patent, mLAD prox to stents 99%   CARDIAC CATHETERIZATION N/A 12/02/2014   Procedure: Coronary Stent Intervention;  Surgeon: Leonie Man, MD;  Location: [place] [facility];  Service: Cardiovascular;  3rd overlapping (prox) mLAD Promus Premier DES 3.0 x 20 (3.25 mm)   CESAREAN SECTION  2000   INNER EAR SURGERY Bilateral "several"   TONSILLECTOMY     TOTAL ABDOMINAL HYSTERECTOMY  ~ 2008   TRANSTHORACIC ECHOCARDIOGRAM  5/19 & 6/ 2016   a) EF 65-70%, mild LVH, Gr 1 DD; b) EF 60% -ron RWMA, no valve lesions   TUBAL LIGATION  2000     OB History   No obstetric history on file.     Family History  Problem Relation Age of Onset   Hyperlipidemia Mother    Sudden death Father    Emphysema Maternal Grandmother        smoked    Social History   Tobacco Use   Smoking status: Former    Packs/day: 1.50    Years: 39.00    Pack years: 58.50    Types: Cigarettes    Quit date: 11/10/2014    Years since quitting: 6.1   Smokeless tobacco: Never   Tobacco comments:    started back but quit again 4 days ago, requests  patch  Substance Use Topics   Alcohol use: No    Alcohol/week: 0.0 standard drinks    Comment: 11/15/2014 "might have a few drinks/yr"   Drug use: Yes    Types: Marijuana    Comment: last marijuana use 02/14/16    Home Medications Prior to Admission medications   Medication Sig Start Date End Date Taking? Authorizing Provider  acetaminophen (TYLENOL) 500 MG chewable tablet Chew 1,000 mg by mouth every 6 (six) hours as needed for pain.   Yes [provider]  aspirin 81 MG EC tablet Take 1 tablet (81 mg total) by mouth  daily. 02/23/16  Yes Ena Dawley, Tiffany S, PA-C  atorvastatin (LIPITOR) 40 MG tablet Take 40 mg by mouth at bedtime.   Yes [provider]  carvedilol (COREG) 3.125 MG tablet TAKE 1 TABLET BY MOUTH 2 TIMES DAILY WITH A MEAL. Patient taking differently: Take 3.125 mg by mouth 2 (two) times daily with a meal. 02/23/16  Yes Ena Dawley, Tiffany S, PA-C  clopidogrel (PLAVIX) 75 MG tablet TAKE ONE TABLET BY MOUTH ONCE DAILY Patient taking differently: Take 75 mg by mouth daily. 08/13/16  Yes Funches, Josalyn, MD  diclofenac Sodium (VOLTAREN) 1 % GEL Apply 2 g topically 4 (four) times daily. Patient taking differently: Apply 2 g topically 4 (four) times daily as needed (knee pain). 11/13/19  Yes Domenic Moras, PA-C  empagliflozin (JARDIANCE) 25 MG TABS tablet Take 25 mg by mouth daily.   Yes [provider]  furosemide (LASIX) 40 MG tablet Take 1 tablet (40 mg total) by mouth daily. Patient taking differently: Take 40 mg by mouth daily as needed for fluid. 02/23/16  Yes Ena Dawley, Tiffany S, PA-C  gabapentin (NEURONTIN) 300 MG capsule Take 300 mg by mouth 4 (four) times daily.   Yes [provider]  glucose monitoring kit (FREESTYLE) monitoring kit 1 each by Does not apply route 4 (four) times daily - after meals and at bedtime. 1 month Diabetic Testing Supplies for QAC-QHS accuchecks. 11/16/14  Yes Debbe Odea, MD  ibuprofen (ADVIL) 200 MG tablet Take 800 mg by mouth  every 6 (six) hours as needed for moderate pain or headache.   Yes [provider]  Insulin Detemir (LEVEMIR) 100 UNIT/ML Pen INJECT 35 UNITS INTO THE SKIN AT BEDTIME. Patient taking differently: Inject 50 Units into the skin 2 (two) times daily. 02/23/16  Yes Ena Dawley, Tiffany S, PA-C  Insulin Syringe-Needle U-100 (TRUEPLUS INSULIN SYRINGE) 30G X 5/16" 0.5 ML MISC 1 each by Does not apply route 4 (four) times daily. 02/23/16  Yes Ena Dawley, Tiffany S, PA-C  isosorbide dinitrate (ISORDIL) 30 MG tablet Take 30 mg by mouth every 6 (six) hours.   Yes [provider]  levothyroxine (SYNTHROID, LEVOTHROID) 200 MCG tablet Take 1 tablet (200 mcg total) by mouth daily before breakfast. Patient taking differently: Take 200 mcg by mouth daily before breakfast. EUTHYROX 02/23/16  Yes Ena Dawley, Tiffany S, PA-C  linaclotide (LINZESS) 72 MCG capsule Take 72 mcg by mouth daily before breakfast.   Yes [provider]  losartan (COZAAR) 100 MG tablet Take 100 mg by mouth daily.   Yes [provider]  nitroGLYCERIN (NITROSTAT) 0.4 MG SL tablet Place 0.4 mg under the tongue every 5 (five) minutes as needed for chest pain (may take three doses by mouth per day as needed).   Yes [provider]  ondansetron (ZOFRAN ODT) 8 MG disintegrating tablet Take 1 tablet (8 mg total) by mouth every 8 (eight) hours as needed for nausea or vomiting. 12/08/15  Yes Funches, Josalyn, MD  atorvastatin (LIPITOR) 80 MG tablet Take 1 tablet (80 mg total) by mouth daily. Patient not taking: No sig reported 04/04/16   Boykin Nearing, MD  FLUoxetine (PROZAC) 20 MG capsule Take 2 capsules (40 mg total) by mouth daily. Patient not taking: Reported on 12/16/2020 05/09/16   Boykin Nearing, MD  insulin aspart (NOVOLOG) 100 UNIT/ML injection Inject 2-10 Units into the skin 3 (three) times daily as needed for high blood sugar (as needed for blood sugar coverage-sliding scale). Patient not taking: Reported on 12/16/2020  02/23/16   Ena Dawley,  Tiffany S, PA-C  isosorbide mononitrate (IMDUR) 30 MG 24 hr tablet Take 1 tablet (30 mg total) by mouth daily. Patient not taking: No sig reported 02/23/16   Brayton Caves, PA-C  LORazepam (ATIVAN) 1 MG tablet TAKE ONE TABLET BY MOUTH ONCE DAILY Patient not taking: Reported on 12/16/2020 07/12/16   Boykin Nearing, MD  metFORMIN (GLUCOPHAGE) 500 MG tablet TAKE 1 TABLET BY MOUTH 2 TIMES DAILY WITH A MEAL. Patient not taking: Reported on 12/16/2020 02/23/16   Brayton Caves, PA-C  nicotine (NICODERM CQ - DOSED IN MG/24 HOURS) 14 mg/24hr patch Place 1 patch (14 mg total) onto the skin at bedtime. Patient not taking: Reported on 12/16/2020 02/17/16   Murlean Iba, MD  Potassium Chloride ER 20 MEQ TBCR Take 40 mEq by mouth 2 (two) times daily. Patient not taking: Reported on 12/16/2020 02/23/16 02/25/16  Brayton Caves, PA-C  ranitidine (ZANTAC) 150 MG tablet Take 1 tablet (150 mg total) by mouth 2 (two) times daily. Patient not taking: Reported on 12/16/2020 02/23/16   Brayton Caves, PA-C    Allergies    Corticosteroids, Lyrica [pregabalin], Penicillins, Amitriptyline, Duloxetine, Gabapentin, and Latex  Review of Systems   Review of Systems  Cardiovascular:  Positive for chest pain.  All other systems reviewed and are negative.  Physical Exam Updated Vital Signs BP 130/80   Pulse 66   Temp 98.2 F (36.8 C)   Resp (!) 21   Ht $R'5\' 2"'Wy$  (1.575 m)   Wt 83.9 kg   SpO2 94%   BMI 33.84 kg/m   Physical Exam Vitals and nursing note reviewed.  Constitutional:      General: She is not in acute distress.    Appearance: Normal appearance. She is well-developed.  HENT:     Head: Normocephalic and atraumatic.     Mouth/Throat:     Mouth: Mucous membranes are moist.  Eyes:     Pupils: Pupils are equal, round, and reactive to light.  Cardiovascular:     Rate and Rhythm: Normal rate and regular rhythm.     Pulses: Normal pulses.     Heart sounds: Normal heart sounds. No  murmur heard.   No friction rub.  Pulmonary:     Effort: Pulmonary effort is normal.     Breath sounds: Normal breath sounds. No wheezing or rales.  Abdominal:     General: Bowel sounds are normal. There is no distension.     Palpations: Abdomen is soft.     Tenderness: There is no abdominal tenderness. There is no guarding or rebound.  Musculoskeletal:        General: No tenderness. Normal range of motion.     Right lower leg: No edema.     Left lower leg: No edema.     Comments: No edema  Skin:    General: Skin is warm and dry.     Findings: No rash.  Neurological:     Mental Status: She is alert and oriented to person, place, and time. Mental status is at baseline.     Cranial Nerves: No cranial nerve deficit.  Psychiatric:        Mood and Affect: Mood normal.        Behavior: Behavior normal.    ED Results / Procedures / Treatments   Labs (all labs ordered are listed, but only abnormal results are displayed) Labs Reviewed  BASIC METABOLIC PANEL - Abnormal; Notable for the following components:  Result Value   Glucose, Bld 196 (*)    All other components within normal limits  CBC WITH DIFFERENTIAL/PLATELET - Abnormal; Notable for the following components:   Abs Immature Granulocytes 0.11 (*)    All other components within normal limits  TROPONIN I (HIGH SENSITIVITY)  TROPONIN I (HIGH SENSITIVITY)    EKG EKG Interpretation  Date/Time:  Friday December 15 2020 20:48:49 EDT Ventricular Rate:  74 PR Interval:  162 QRS Duration: 132 QT Interval:  424 QTC Calculation: 470 R Axis:   -67 Text Interpretation: Normal sinus rhythm Left axis deviation new Right bundle branch block since  02/16/16 Confirmed by Blanchie Dessert [phone]) on 12/16/2020 7:31:35 AM  Radiology DG Chest 1 View  Result Date: 12/15/2020 CLINICAL DATA:  Chest pain, upper extremity weakness EXAM: CHEST  1 VIEW COMPARISON:  02/15/2016 FINDINGS: The heart size and mediastinal contours are within normal  limits. Minimal linear bibasilar opacities favor atelectasis. Otherwise, no focal airspace consolidation, pleural effusion, or pneumothorax. The visualized skeletal structures are unremarkable. IMPRESSION: Minimal bibasilar opacities, likely atelectasis. Electronically Signed   By: Davina Poke D.O.   On: 12/15/2020 21:40    Procedures Procedures   Medications Ordered in ED Medications  HYDROcodone-acetaminophen (NORCO/VICODIN) 5-325 MG per tablet 1 tablet (has no administration in time range)    ED Course  I have reviewed the triage vital signs and the nursing notes.  Pertinent labs & imaging results that were available during my care of the patient were reviewed by me and considered in my medical decision making (see chart for details).    MDM Rules/Calculators/A&P                          Patient is a 60 year old female with extensive cardiac history presenting today due to chest discomfort radiating to her arms.  She states it is worse with exertion.  Patient has received 8 or 9 stents in the past the most recent was 4 years ago in [place].  Patient has been taking her medications.  She does continue to use marijuana but has not used tobacco in 2 years.  Symptoms are still present currently at an 8 out of 10.  She has had an intermittent headache as well but denies any URI symptoms, congestion.  Patient's chest x-ray is without acute findings.  Delta troponins are negative, EKG shows a right bundle branch block which is changed from an old one in 68.  Patient states that nitroglycerin makes her have a terrible headache and feel worse.  She states in the past hydrocodone has helped with chest pain.  She was given 1 hydrocodone.  CMP today with stable findings other than a blood sugar of 196.  Spoke with Dr. Percival Spanish with cardiology they will evaluate the patient.  She will need admitted for rule out.  Will discuss with cardiology if they would like to start heparin.  MDM    Amount and/or Complexity of Data Reviewed Clinical lab tests: reviewed and ordered Tests in the radiology section of CPT: ordered and reviewed Tests in the medicine section of CPT: ordered and reviewed Decide to obtain previous medical records or to obtain history from someone other than the patient: yes Obtain history from someone other than the patient: yes Review and summarize past medical records: yes Independent visualization of images, tracings, or specimens: yes  Risk of Complications, Morbidity, and/or Mortality Presenting problems: moderate Diagnostic procedures: moderate Management options: moderate  Patient Progress  Patient progress: stable   Final Clinical Impression(s) / ED Diagnoses Final diagnoses:  Chest pain  Unstable angina [facility])    Rx / DC Orders ED Discharge Orders     None        Blanchie Dessert, MD 12/16/20 [phone]

## 2020-12-16 NOTE — Plan of Care (Signed)

## 2020-12-16 NOTE — H&P (Addendum)
Cardiology Admission History and Physical:   Patient ID: Jacqueline Munoz MRN: [id number]; DOB: 1960-09-10   Admission date: 12/15/2020  PCP:  Pcp, No   [facility] Providers Cardiologist:  remotely seen by Dr. Ellyn Hack, last seen Feb 2017    Chief Complaint:  Chest pain  Patient Profile:   [name] is a 60 y.o. female with CAD, hypertension, hyperlipidemia, DM 2, hypothyroidism and former tobacco use who is being seen 12/16/2020 for the evaluation of chest pain.  History of Present Illness:   [name] is a pleasant 60 year old female with past medical history of CAD, hypertension, hyperlipidemia, DM 2, hypothyroidism and former tobacco use.  She had multiple PCI's in the past.  Last cardiac catheterization was performed in 2016 in the setting of NSTEMI.  She had bifurcation PCI of left circumflex-OM and then staged PCI of proximal LAD overlapping the mid LAD stent.  She has chronically occluded RCA.  She was converted from Brilinta to Plavix due to financial reasons.  Echocardiogram in December 2016 showed EF 55 to 60%, grade 2 DD, mild MR.  Since then, she moved to [place] and had another 2 stents placed in [place] (unknown vessels, [facility]) about 4 years ago.  About 2 years ago, she finally quit smoking after her friend died of lung cancer.  She has since moved back to [place].  About 2 weeks ago, she was seen in [place] ED due to asthma exacerbation.  She has never been diagnosed with COPD despite years of smoking.  She was in her usual state of health until 2 days ago, she started having a substernal chest pressure radiating down the bilateral arms.  This occurs both at rest and with exertion but more noticeable when she exerted herself.  The characteristic of the chest pain is very similar to the previous angina.  She eventually sought medical attention at [facility].  Serial troponin was negative.  Renal function and electrolyte normal.   Hemoglobin normal.  Chest x-ray showed minimal bibasilar atelectasis without acute focal opacity.  EKG obtained in the ED showed sinus rhythm with right bundle branch block, no clear ST elevation or depression.  When compared to the previous EKG, previous EKG obtained in August 2017 still showed a narrow QRS with significant bradycardia.   Past Medical History:  Diagnosis Date   Arthritis    CAD S/P percutaneous coronary angioplasty    a. NSTEMI: s/p DES x2 LAD and DES to bifurcation of OM1 and mid to distal LCx (11/16/14) b. UA/ACS:  overlapping DES x1 to LAD for de novo 99% focal lesion in LAD (12/02/2014)   Chronic back pain    Chronic depression    Coronary artery chronic total occlusion: 100% RCA CTO 11/16/2014   a. Prox RCA to Mid RCA lesion, 100% stenosed.    Diabetes mellitus, type 2 ([place])    Essential hypertension    Fibromyalgia    History of stomach ulcers    History of tobacco abuse    HLD (hyperlipidemia)    Hypothyroid    Kidney stones    OSA (obstructive sleep apnea)    a. does not wear CPAP   Type IVa MI - Peri-PCI 11/17/2014   Prolonged ischemia during bifurcation PCI of  m-d Cx-OM1 with minicrush technique    Past Surgical History:  Procedure Laterality Date   CARDIAC CATHETERIZATION N/A 11/16/2014   Procedure: Left Heart Cath and Coronary Angiography;  Surgeon: Peter M Martinique, MD;  Location: [place] [facility];  Service: Cardiovascular;  RCA 100% CTO, m-dLAD 90%, m-dCx 95%, OM1 90%   CARDIAC CATHETERIZATION  11/16/2014   Procedure: Coronary Stent Intervention;  Surgeon: Peter M Martinique, MD;  Location: [place] [facility];  Service: Cardiovascular;;m-d LAD overlapping 3.0 x 16 & 2.75 x 16 Promus P DES; OM1-dCx Minicrush bifurcation (dCx 2.25 x 38 Synergy DES crushed with 2.75 x 24 Synergy DES in OM1)    CARDIAC CATHETERIZATION N/A 12/02/2014   Procedure: Left Heart Cath and Coronary Angiography;  Surgeon: Leonie Man, MD;  Location: [place] [facility];  Service:  Cardiovascular;  Previous Stents in LAD & Cx-OM patent, mLAD prox to stents 99%   CARDIAC CATHETERIZATION N/A 12/02/2014   Procedure: Coronary Stent Intervention;  Surgeon: Leonie Man, MD;  Location: [place] [facility];  Service: Cardiovascular;  3rd overlapping (prox) mLAD Promus Premier DES 3.0 x 20 (3.25 mm)   CESAREAN SECTION  2000   INNER EAR SURGERY Bilateral "several"   TONSILLECTOMY     TOTAL ABDOMINAL HYSTERECTOMY  ~ 2008   TRANSTHORACIC ECHOCARDIOGRAM  5/19 & 6/ 2016   a) EF 65-70%, mild LVH, Gr 1 DD; b) EF 60% -ron RWMA, no valve lesions   TUBAL LIGATION  2000     Medications Prior to Admission: Prior to Admission medications   Medication Sig Start Date End Date Taking? Authorizing Provider  acetaminophen (TYLENOL) 500 MG chewable tablet Chew 1,000 mg by mouth every 6 (six) hours as needed for pain.   Yes [provider]  aspirin 81 MG EC tablet Take 1 tablet (81 mg total) by mouth daily. 02/23/16  Yes Ena Dawley, Tiffany S, PA-C  atorvastatin (LIPITOR) 40 MG tablet Take 40 mg by mouth at bedtime.   Yes [provider]  carvedilol (COREG) 3.125 MG tablet TAKE 1 TABLET BY MOUTH 2 TIMES DAILY WITH A MEAL. Patient taking differently: Take 3.125 mg by mouth 2 (two) times daily with a meal. 02/23/16  Yes Ena Dawley, Tiffany S, PA-C  clopidogrel (PLAVIX) 75 MG tablet TAKE ONE TABLET BY MOUTH ONCE DAILY Patient taking differently: Take 75 mg by mouth daily. 08/13/16  Yes Funches, Josalyn, MD  diclofenac Sodium (VOLTAREN) 1 % GEL Apply 2 g topically 4 (four) times daily. Patient taking differently: Apply 2 g topically 4 (four) times daily as needed (knee pain). 11/13/19  Yes Domenic Moras, PA-C  empagliflozin (JARDIANCE) 25 MG TABS tablet Take 25 mg by mouth daily.   Yes [provider]  furosemide (LASIX) 40 MG tablet Take 1 tablet (40 mg total) by mouth daily. Patient taking differently: Take 40 mg by mouth daily as needed for fluid. 02/23/16  Yes Ena Dawley, Tiffany S, PA-C   gabapentin (NEURONTIN) 300 MG capsule Take 300 mg by mouth 4 (four) times daily.   Yes [provider]  glucose monitoring kit (FREESTYLE) monitoring kit 1 each by Does not apply route 4 (four) times daily - after meals and at bedtime. 1 month Diabetic Testing Supplies for QAC-QHS accuchecks. 11/16/14  Yes Debbe Odea, MD  ibuprofen (ADVIL) 200 MG tablet Take 800 mg by mouth every 6 (six) hours as needed for moderate pain or headache.   Yes [provider]  Insulin Detemir (LEVEMIR) 100 UNIT/ML Pen INJECT 35 UNITS INTO THE SKIN AT BEDTIME. Patient taking differently: Inject 50 Units into the skin 2 (two) times daily. 02/23/16  Yes Ena Dawley, Tiffany S, PA-C  Insulin Syringe-Needle U-100 (TRUEPLUS INSULIN SYRINGE) 30G X 5/16" 0.5  ML MISC 1 each by Does not apply route 4 (four) times daily. 02/23/16  Yes Ena Dawley, Tiffany S, PA-C  isosorbide dinitrate (ISORDIL) 30 MG tablet Take 30 mg by mouth every 6 (six) hours.   Yes [provider]  levothyroxine (SYNTHROID, LEVOTHROID) 200 MCG tablet Take 1 tablet (200 mcg total) by mouth daily before breakfast. Patient taking differently: Take 200 mcg by mouth daily before breakfast. EUTHYROX 02/23/16  Yes Ena Dawley, Tiffany S, PA-C  linaclotide (LINZESS) 72 MCG capsule Take 72 mcg by mouth daily before breakfast.   Yes [provider]  losartan (COZAAR) 100 MG tablet Take 100 mg by mouth daily.   Yes [provider]  nitroGLYCERIN (NITROSTAT) 0.4 MG SL tablet Place 0.4 mg under the tongue every 5 (five) minutes as needed for chest pain (may take three doses by mouth per day as needed).   Yes [provider]  ondansetron (ZOFRAN ODT) 8 MG disintegrating tablet Take 1 tablet (8 mg total) by mouth every 8 (eight) hours as needed for nausea or vomiting. 12/08/15  Yes Funches, Josalyn, MD  atorvastatin (LIPITOR) 80 MG tablet Take 1 tablet (80 mg total) by mouth daily. Patient not taking: No sig reported 04/04/16   Boykin Nearing,  MD  FLUoxetine (PROZAC) 20 MG capsule Take 2 capsules (40 mg total) by mouth daily. Patient not taking: Reported on 12/16/2020 05/09/16   Boykin Nearing, MD  insulin aspart (NOVOLOG) 100 UNIT/ML injection Inject 2-10 Units into the skin 3 (three) times daily as needed for high blood sugar (as needed for blood sugar coverage-sliding scale). Patient not taking: Reported on 12/16/2020 02/23/16   Brayton Caves, PA-C  isosorbide mononitrate (IMDUR) 30 MG 24 hr tablet Take 1 tablet (30 mg total) by mouth daily. Patient not taking: No sig reported 02/23/16   Brayton Caves, PA-C  LORazepam (ATIVAN) 1 MG tablet TAKE ONE TABLET BY MOUTH ONCE DAILY Patient not taking: Reported on 12/16/2020 07/12/16   Boykin Nearing, MD  metFORMIN (GLUCOPHAGE) 500 MG tablet TAKE 1 TABLET BY MOUTH 2 TIMES DAILY WITH A MEAL. Patient not taking: Reported on 12/16/2020 02/23/16   Brayton Caves, PA-C  nicotine (NICODERM CQ - DOSED IN MG/24 HOURS) 14 mg/24hr patch Place 1 patch (14 mg total) onto the skin at bedtime. Patient not taking: Reported on 12/16/2020 02/17/16   Murlean Iba, MD  Potassium Chloride ER 20 MEQ TBCR Take 40 mEq by mouth 2 (two) times daily. Patient not taking: Reported on 12/16/2020 02/23/16 02/25/16  Brayton Caves, PA-C  ranitidine (ZANTAC) 150 MG tablet Take 1 tablet (150 mg total) by mouth 2 (two) times daily. Patient not taking: Reported on 12/16/2020 02/23/16   Brayton Caves, PA-C     Allergies:    Allergies  Allergen Reactions   Corticosteroids Anaphylaxis    ALL steroids.      Lyrica [Pregabalin] Shortness Of Breath and Swelling   Penicillins Anaphylaxis    Has patient had a PCN reaction causing immediate rash, facial/tongue/throat swelling, SOB or lightheadedness with hypotension: {Yes Has patient had a PCN reaction causing severe rash involving mucus membranes or skin necrosis: NO Has patient had a PCN reaction that required hospitalization NO Has patient had a PCN reaction occurring  within the last 10 years: {Yes If all of the above answers are "NO", then may proceed with Cephalosporin use.    Amitriptyline Other (See Comments)    Muscle Cramps   Duloxetine     Other reaction(s): neurological  reaction   Gabapentin Swelling   Latex Rash    PAPER TAPE ONLY    Social History:   Social History   Socioeconomic History   Marital status: Widowed    Spouse name: Not on file   Number of children: Not on file   Years of education: Not on file   Highest education level: Not on file  Occupational History   Occupation: disabled  Tobacco Use   Smoking status: Former    Packs/day: 1.50    Years: 39.00    Pack years: 58.50    Types: Cigarettes    Quit date: 11/10/2014    Years since quitting: 6.1   Smokeless tobacco: Never   Tobacco comments:    started back but quit again 4 days ago, requests patch  Substance and Sexual Activity   Alcohol use: No    Alcohol/week: 0.0 standard drinks    Comment: 11/15/2014 "might have a few drinks/yr"   Drug use: Yes    Types: Marijuana    Comment: last marijuana use 02/14/16   Sexual activity: Not on file  Other Topics Concern   Not on file  Social History Narrative   Patient reports living in a motel.  She is thinking of moving to a motel at [employer] during the off-season.   Social Determinants of Health   Financial Resource Strain: Not on file  Food Insecurity: Not on file  Transportation Needs: Not on file  Physical Activity: Not on file  Stress: Not on file  Social Connections: Not on file  Intimate Partner Violence: Not on file    Family History:   The patient's family history includes Emphysema in her maternal grandmother; Hyperlipidemia in her mother; Sudden death in her father.    ROS:  Please see the history of present illness.  All other ROS reviewed and negative.     Physical Exam/Data:   Vitals:   12/16/20 0730 12/16/20 0800 12/16/20 0830 12/16/20 0900  BP: (!) 147/74 130/80 (!) 142/75 (!) 146/79   Pulse: 60 66 61 60  Resp: 12 (!) $Remo'21 20 17  'kvAHp$ Temp:      TempSrc:      SpO2: 98% 94% 94% 96%  Weight:      Height:       No intake or output data in the 24 hours ending 12/16/20 0957 Last 3 Weights 12/15/2020 11/13/2019 02/23/2016  Weight (lbs) 185 lb 200 lb 179 lb  Weight (kg) 83.915 kg 90.719 kg 81.194 kg     Body mass index is 33.84 kg/m.  General:  Well nourished, well developed, in no acute distress HEENT: normal Lymph: no adenopathy Neck: no JVD Endocrine:  No thryomegaly Vascular: No carotid bruits; FA pulses 2+ bilaterally without bruits  Cardiac:  normal S1, S2; RRR; no murmur  Lungs: Diminished breath sound bilaterally Abd: soft, nontender, no hepatomegaly  Ext: no edema Musculoskeletal:  No deformities, BUE and BLE strength normal and equal Skin: warm and dry  Neuro:  CNs 2-12 intact, no focal abnormalities noted Psych:  Normal affect    EKG:  The ECG that was done and was personally reviewed and demonstrates sinus rhythm, right bundle branch block, no significant ST changes.  Relevant CV Studies:  Cath 11/16/2014 Prox RCA to Mid RCA lesion, 100% stenosed. Mid LAD to Dist LAD lesion, 90% stenosed. There is a 0% residual stenosis post intervention. A drug-eluting stent x 2 was placed. Mid Cx to Dist Cx lesion, 95% stenosed. There is a  0% residual stenosis post intervention. A drug-eluting stent was placed. Ost 1st Mrg to 1st Mrg lesion, 90% stenosed. There is a 0% residual stenosis post intervention. A drug-eluting stent was placed.   1. Severe 3 vessel obstructive CAD 2. Normal LV function 3. Successful stenting of the mid LAD with DES x 2. 4. Successful bifurcation stenting of the first OM and mid to distal LCx with DES. 5. Patient had some residual chest pain post procedure with mild ST elevation in the inferolateral leads. She did have a long period of ischemia during the procedure and I think some of her ischemia was related to compromise of collateral flow  to the RCA. At the end of procedure she had TIMI 3 flow in all LCA vessels and well preserved collateral flow to the RCA.   Recommendation: will observe in ICU overnight. Monitor serial cardiac enzymes and Ecg. Aggressive risk factor modification. DAPT therapy indefinitely. Will start with ASA and Brilinta. If cost is an issue could consider transition of Brilinta to plavix (with load) in 6 weeks.   Cath 12/02/2014 Severe three-vessel disease with known RCA occlusion, widely patent stents in the circumflex-OM bifurcation region and mid LAD, but upstream LAD 99% focal stenosis Mid LAD lesion, 99% stenosed proximal to previously placed overlapping stents. A third overlapping drug-eluting stent was placed. There is a 0% residual stenosis post intervention. Known Prox RCA to Mid RCA lesion, 100% stenosed. Widely patent circumflex-OM stents Well-preserved LVEF with normal LVEDP   Class 3 Angina/Acute Coronary Syndrome secondary to [employer] 99% focal lesion at the second septal perforator, proximal to the more proximal overlapping DES stent in the mid LAD. This lesion was treated with a another 3 mm Promus DES stent overlapping the more proximal of the previously placed stents.     Recommendations: Standard post radial cath PCI care with TR band removal Continue dual and platelet therapy for minimum one year Continue aggressive risk factor modification with close glycemic control, smoking cessation, statin and blood pressure control (can likely titrate beta blocker dose) Expected discharge tomorrow if stable.   Echo 06/12/2015 Study Conclusions   - Left ventricle: The cavity size was normal. Systolic function was    normal. The estimated ejection fraction was in the range of 55%    to 60%. Wall motion was normal; there were no regional wall    motion abnormalities. Features are consistent with a pseudonormal    left ventricular filling pattern, with concomitant abnormal    relaxation and  increased filling pressure (grade 2 diastolic    dysfunction).  - Mitral valve: Calcified annulus. There was mild regurgitation.  - Left atrium: The atrium was mildly dilated.  - Atrial septum: There was increased thickness of the septum,    consistent with lipomatous hypertrophy.  - Tricuspid valve: There was mild regurgitation.   Laboratory Data:  High Sensitivity Troponin:   Recent Labs  Lab 12/15/20 2112 12/15/20 2300  TROPONINIHS 5 5      Chemistry Recent Labs  Lab 12/15/20 2112  NA 136  K 3.9  CL 101  CO2 24  GLUCOSE 196*  BUN 12  CREATININE 0.70  CALCIUM 9.2  GFRNONAA >60  ANIONGAP 11    No results for input(s): PROT, ALBUMIN, AST, ALT, ALKPHOS, BILITOT in the last 168 hours. Hematology Recent Labs  Lab 12/15/20 2112  WBC 10.2  RBC 4.57  HGB 13.3  HCT 41.5  MCV 90.8  MCH 29.1  MCHC 32.0  RDW 14.2  PLT 270   BNPNo results for input(s): BNP, PROBNP in the last 168 hours.  DDimer No results for input(s): DDIMER in the last 168 hours.   Radiology/Studies:  DG Chest 1 View  Result Date: 12/15/2020 CLINICAL DATA:  Chest pain, upper extremity weakness EXAM: CHEST  1 VIEW COMPARISON:  02/15/2016 FINDINGS: The heart size and mediastinal contours are within normal limits. Minimal linear bibasilar opacities favor atelectasis. Otherwise, no focal airspace consolidation, pleural effusion, or pneumothorax. The visualized skeletal structures are unremarkable. IMPRESSION: Minimal bibasilar opacities, likely atelectasis. Electronically Signed   By: Davina Poke D.O.   On: 12/15/2020 21:40     Assessment and Plan:   Chest pain: Concerning for unstable angina.  The characteristic of the chest pain is similar to the previous anginal discomfort.  Per patient, she has at least 7 stents.  Last cardiac catheterization in our system was dating back to 2016 where she underwent bifurcation stenting in left circumflex/OM and a staged PCI of LAD.  She has chronically occluded  RCA.  Since then, she reportedly had another 2 stents placed in [place] about 4 years ago.  -Serial troponin negative.  EKG showed new right bundle branch block when compared to the old EKG from 5 years ago.  No obvious ST changes.  -Given similarity of chest pain when compared to the previous angina, consider repeat cath.  Last cardiac catheterization was performed in [place] about 4 years ago where she received 2 stents.  Patient reportedly had a total of 7 stents in the past.  Hypertension: Continue 3.125 mg twice a day of carvedilol and 100 mg daily of losartan.  She had a history of bradycardia back in 2017.  Avoid significant up titration of carvedilol.  Hyperlipidemia: On Lipitor.  Recheck fasting lipid panel  DM2  Hypothyroidism  Former tobacco use: Quit 2 years ago when her friend died of lung cancer.  She has diffusely diminished breath sound on exam, I suspect she likely has underlying COPD on top of her asthma.  Asthma: She was seen at [facility] ED 2 weeks ago for asthma exacerbation.  Physical exam continued to show diminished breath sound bilaterally, however no active wheezing.  SVT: noted to have short bursts of SVT on telemetry, will increase coreg   Risk Assessment/Risk Scores:    TIMI Risk Score for Unstable Angina or Non-ST Elevation MI:   The patient's TIMI risk score is 4, which indicates a 20% risk of all cause mortality, new or recurrent myocardial infarction or need for urgent revascularization in the next 14 days.       Severity of Illness: The appropriate patient status for this patient is INPATIENT. Inpatient status is judged to be reasonable and necessary in order to provide the required intensity of service to ensure the patient's safety. The patient's presenting symptoms, physical exam findings, and initial radiographic and laboratory data in the context of their chronic comorbidities is felt to place them at high risk for further clinical  deterioration. Furthermore, it is not anticipated that the patient will be medically stable for discharge from the hospital within 2 midnights of admission. The following factors support the patient status of inpatient.   " The patient's presenting symptoms include chest pain. " The worrisome physical exam findings include benign. " The initial radiographic and laboratory data are worrisome because of abnormal EKG. " The chronic co-morbidities include CAD, HTN, HLD, and DM II.   * I certify that at the point of admission  it is my clinical judgment that the patient will require inpatient hospital care spanning beyond 2 midnights from the point of admission due to high intensity of service, high risk for further deterioration and high frequency of surveillance required.*   For questions or updates, please contact [place] Please consult [email or web address] for contact info under     Signed, Almyra Deforest, [place]  12/16/2020 9:57 AM    History and all data above reviewed.  The patient is reestablishing care with [place].  She has had multiple interventions as above.  She is done well for a while but now presents with increasing discomfort in her chest similar to previous angina.  She had some radiation down her arms.  She did have some mild nausea.  This happened with exertion such as folding close but is new onset so consistent with unstable symptoms.  She is not having any new shortness of breath, PND or orthopnea.  She is not reporting any new palpitations, presyncope or syncope.  Patient examined.  I agree with the findings as above.  The patient exam reveals COR:RRR  ,  Lungs: Decreased breath sounds but no wheezing or crackles,  Abd: Positive bowel sounds normal in frequency and pitch, Ext no edema 2+ pulses.  All available labs, radiology testing, previous records reviewed. Agree with documented assessment and plan.  Unstable angina: The patient has symptoms consistent with unstable angina.  Cardiac  catheterization is indicated.  We will plan for Monday.  She will be heparinized.  Jeneen Rinks Hochrein  11:17 AM  12/16/2020

## 2020-12-16 NOTE — Progress Notes (Signed)
  Echocardiogram 2D Echocardiogram has been performed.  Jacqueline Munoz 12/16/2020, 4:14 PM

## 2020-12-17 DIAGNOSIS — M797 Fibromyalgia: Secondary | ICD-10-CM | POA: Diagnosis present

## 2020-12-17 DIAGNOSIS — Z7984 Long term (current) use of oral hypoglycemic drugs: Secondary | ICD-10-CM | POA: Diagnosis not present

## 2020-12-17 DIAGNOSIS — T82855A Stenosis of coronary artery stent, initial encounter: Secondary | ICD-10-CM | POA: Diagnosis present

## 2020-12-17 DIAGNOSIS — I25110 Atherosclerotic heart disease of native coronary artery with unstable angina pectoris: Secondary | ICD-10-CM | POA: Diagnosis present

## 2020-12-17 DIAGNOSIS — G8929 Other chronic pain: Secondary | ICD-10-CM | POA: Diagnosis present

## 2020-12-17 DIAGNOSIS — Z7902 Long term (current) use of antithrombotics/antiplatelets: Secondary | ICD-10-CM | POA: Diagnosis not present

## 2020-12-17 DIAGNOSIS — M549 Dorsalgia, unspecified: Secondary | ICD-10-CM | POA: Diagnosis present

## 2020-12-17 DIAGNOSIS — E782 Mixed hyperlipidemia: Secondary | ICD-10-CM | POA: Diagnosis present

## 2020-12-17 DIAGNOSIS — T463X5A Adverse effect of coronary vasodilators, initial encounter: Secondary | ICD-10-CM | POA: Diagnosis not present

## 2020-12-17 DIAGNOSIS — Y92230 Patient room in hospital as the place of occurrence of the external cause: Secondary | ICD-10-CM | POA: Diagnosis not present

## 2020-12-17 DIAGNOSIS — M1990 Unspecified osteoarthritis, unspecified site: Secondary | ICD-10-CM | POA: Diagnosis present

## 2020-12-17 DIAGNOSIS — I2510 Atherosclerotic heart disease of native coronary artery without angina pectoris: Secondary | ICD-10-CM | POA: Diagnosis not present

## 2020-12-17 DIAGNOSIS — F32A Depression, unspecified: Secondary | ICD-10-CM | POA: Diagnosis present

## 2020-12-17 DIAGNOSIS — Z9851 Tubal ligation status: Secondary | ICD-10-CM | POA: Diagnosis not present

## 2020-12-17 DIAGNOSIS — Y831 Surgical operation with implant of artificial internal device as the cause of abnormal reaction of the patient, or of later complication, without mention of misadventure at the time of the procedure: Secondary | ICD-10-CM | POA: Diagnosis present

## 2020-12-17 DIAGNOSIS — J45909 Unspecified asthma, uncomplicated: Secondary | ICD-10-CM | POA: Diagnosis present

## 2020-12-17 DIAGNOSIS — Z20822 Contact with and (suspected) exposure to covid-19: Secondary | ICD-10-CM | POA: Diagnosis present

## 2020-12-17 DIAGNOSIS — I471 Supraventricular tachycardia: Secondary | ICD-10-CM | POA: Diagnosis present

## 2020-12-17 DIAGNOSIS — I5032 Chronic diastolic (congestive) heart failure: Secondary | ICD-10-CM | POA: Diagnosis present

## 2020-12-17 DIAGNOSIS — E039 Hypothyroidism, unspecified: Secondary | ICD-10-CM | POA: Diagnosis present

## 2020-12-17 DIAGNOSIS — I200 Unstable angina: Secondary | ICD-10-CM | POA: Diagnosis present

## 2020-12-17 DIAGNOSIS — G4733 Obstructive sleep apnea (adult) (pediatric): Secondary | ICD-10-CM | POA: Diagnosis present

## 2020-12-17 DIAGNOSIS — E119 Type 2 diabetes mellitus without complications: Secondary | ICD-10-CM | POA: Diagnosis present

## 2020-12-17 DIAGNOSIS — I10 Essential (primary) hypertension: Secondary | ICD-10-CM | POA: Diagnosis present

## 2020-12-17 DIAGNOSIS — I110 Hypertensive heart disease with heart failure: Secondary | ICD-10-CM | POA: Diagnosis present

## 2020-12-17 DIAGNOSIS — I2582 Chronic total occlusion of coronary artery: Secondary | ICD-10-CM | POA: Diagnosis present

## 2020-12-17 DIAGNOSIS — I252 Old myocardial infarction: Secondary | ICD-10-CM | POA: Diagnosis not present

## 2020-12-17 DIAGNOSIS — G4440 Drug-induced headache, not elsewhere classified, not intractable: Secondary | ICD-10-CM | POA: Diagnosis not present

## 2020-12-17 LAB — BASIC METABOLIC PANEL
Anion gap: 7 (ref 5–15)
BUN: 13 mg/dL (ref 6–20)
CO2: 26 mmol/L (ref 22–32)
Calcium: 8.7 mg/dL — ABNORMAL LOW (ref 8.9–10.3)
Chloride: 104 mmol/L (ref 98–111)
Creatinine, Ser: 0.64 mg/dL (ref 0.44–1.00)
GFR, Estimated: >60 mL/min (ref >60)
Glucose, Bld: 168 mg/dL — ABNORMAL HIGH (ref 70–99)
Potassium: 3.5 mmol/L (ref 3.5–5.1)
Sodium: 137 mmol/L (ref 135–145)

## 2020-12-17 LAB — TSH: TSH: 37.448 u[IU]/mL — ABNORMAL HIGH (ref 0.350–4.500)

## 2020-12-17 LAB — GLUCOSE, CAPILLARY
Glucose-Capillary: 136 mg/dL — ABNORMAL HIGH (ref 70–99)
Glucose-Capillary: 213 mg/dL — ABNORMAL HIGH (ref 70–99)
Glucose-Capillary: 122 mg/dL — ABNORMAL HIGH (ref 70–99)
Glucose-Capillary: 277 mg/dL — ABNORMAL HIGH (ref 70–99)

## 2020-12-17 LAB — LIPID PANEL
Cholesterol: 149 mg/dL (ref 0–200)
HDL: 40 mg/dL — ABNORMAL LOW (ref >40)
LDL Cholesterol: 61 mg/dL (ref 0–99)
Total CHOL/HDL Ratio: 3.7 RATIO
Triglycerides: 241 mg/dL — ABNORMAL HIGH (ref <150)
VLDL: 48 mg/dL — ABNORMAL HIGH (ref 0–40)

## 2020-12-17 LAB — HEPARIN LEVEL (UNFRACTIONATED): Heparin Unfractionated: 0.26 IU/mL — ABNORMAL LOW (ref 0.30–0.70)

## 2020-12-17 MED ORDER — SODIUM CHLORIDE 0.9 % WEIGHT BASED INFUSION
3.0000 mL/kg/h | INTRAVENOUS | Status: AC
  Administered 2020-12-18: 3 mL/kg/h via INTRAVENOUS

## 2020-12-17 MED ORDER — HEPARIN BOLUS VIA INFUSION
4000.0000 [IU] | Freq: Once | INTRAVENOUS | Status: AC
  Administered 2020-12-17: 4000 [IU] via INTRAVENOUS
  Filled 2020-12-17: qty 4000

## 2020-12-17 MED ORDER — HEPARIN (PORCINE) 25000 UT/250ML-% IV SOLN
1100.0000 [IU]/h | INTRAVENOUS | Status: DC
  Administered 2020-12-17: 850 [IU]/h via INTRAVENOUS
  Administered 2020-12-18: 1000 [IU]/h via INTRAVENOUS
  Filled 2020-12-17 (×2): qty 250

## 2020-12-17 MED ORDER — ALPRAZOLAM 0.25 MG PO TABS
0.2500 mg | ORAL_TABLET | Freq: Two times a day (BID) | ORAL | Status: DC | PRN
  Administered 2020-12-17 – 2020-12-18 (×4): 0.25 mg via ORAL
  Filled 2020-12-17 (×4): qty 1

## 2020-12-17 MED ORDER — ASPIRIN 81 MG PO CHEW
81.0000 mg | CHEWABLE_TABLET | ORAL | Status: AC
  Administered 2020-12-18: 81 mg via ORAL
  Filled 2020-12-17: qty 1

## 2020-12-17 MED ORDER — NITROGLYCERIN 0.4 MG SL SUBL: 0.4000 mg | SUBLINGUAL_TABLET | SUBLINGUAL | Status: DC | PRN

## 2020-12-17 MED ORDER — SODIUM CHLORIDE 0.9 % WEIGHT BASED INFUSION
1.0000 mL/kg/h | INTRAVENOUS | Status: DC
  Administered 2020-12-18: 1 mL/kg/h via INTRAVENOUS

## 2020-12-17 NOTE — Progress Notes (Signed)
ANTICOAGULATION CONSULT NOTE - Initial Consult  Pharmacy Consult for Heparin Indication: chest pain/ACS  Allergies  Allergen Reactions   Corticosteroids Anaphylaxis    ALL steroids.      Lyrica [Pregabalin] Shortness Of Breath and Swelling   Penicillins Anaphylaxis    Has patient had a PCN reaction causing immediate rash, facial/tongue/throat swelling, SOB or lightheadedness with hypotension: {Yes Has patient had a PCN reaction causing severe rash involving mucus membranes or skin necrosis: NO Has patient had a PCN reaction that required hospitalization NO Has patient had a PCN reaction occurring within the last 10 years: {Yes If all of the above answers are "NO", then may proceed with Cephalosporin use.    Amitriptyline Other (See Comments)    Muscle Cramps   Duloxetine     Other reaction(s): neurological reaction   Gabapentin Swelling   Latex Rash    PAPER TAPE ONLY    Patient Measurements: Height: 5\' 2"  (157.5 cm) Weight: 83.9 kg (185 lb) IBW/kg (Calculated) : 50.1 kg Heparin Dosing Weight: 69 kg  Vital Signs: Temp: 97.9 F (36.6 C) (06/19 0825) Temp Source: Oral (06/19 0825) BP: 137/86 (06/19 0825) Pulse Rate: 74 (06/19 0825)  Labs: Recent Labs    12/15/20 2112 12/15/20 2300 12/16/20 1328 12/17/20 0234  HGB 13.3  --  14.1  --   HCT 41.5  --  44.6  --   PLT 270  --  247  --   CREATININE 0.70  --  0.76 0.64  TROPONINIHS 5 5  --   --     Estimated Creatinine Clearance: 75.1 mL/min (by C-G formula based on SCr of 0.64 mg/dL).   Medical History: Past Medical History:  Diagnosis Date   Arthritis    CAD S/P percutaneous coronary angioplasty    a. NSTEMI: s/p DES x2 LAD and DES to bifurcation of OM1 and mid to distal LCx (11/16/14) b. UA/ACS:  overlapping DES x1 to LAD for de novo 99% focal lesion in LAD (12/02/2014)   Chronic back pain    Chronic depression    Coronary artery chronic total occlusion: 100% RCA CTO 11/16/2014   a. Prox RCA to Mid RCA lesion,  100% stenosed.    Diabetes mellitus, type 2 ([place])    Essential hypertension    Fibromyalgia    History of stomach ulcers    History of tobacco abuse    HLD (hyperlipidemia)    Hypothyroid    Kidney stones    OSA (obstructive sleep apnea)    a. does not wear CPAP   Type IVa MI - Peri-PCI 11/17/2014   Prolonged ischemia during bifurcation PCI of  m-d Cx-OM1 with minicrush technique    Assessment: 60 yo female presents with chest pain, likely unstable angina. The patient has a history of CAD with at least 7 stents being placed. The patient is planned for a cardiac cath on 6/20. PTA the patient is not on anticoagulation. Pharmacy is consulted to dose heparin.   Goal of Therapy:  Heparin level 0.3-0.7 units/ml Monitor platelets by anticoagulation protocol: Yes   Plan:  -Give heparin IV 4000 unit bolus x1 dose, then IV infusion at 850 units/hr -Obtain a 6-hr heparin level -Obtain a daily heparin level and CBC -Monitor for signs and symptoms of bleeding   Shauna Hugh, PharmD, [place]  PGY-1 Pharmacy Resident 12/17/2020 9:05 AM  Please check [email or web address] for unit-specific pharmacy phone numbers.

## 2020-12-17 NOTE — Plan of Care (Signed)

## 2020-12-17 NOTE — Progress Notes (Signed)
ANTICOAGULATION CONSULT NOTE   Pharmacy Consult for Heparin Indication: chest pain/ACS  Allergies  Allergen Reactions   Corticosteroids Anaphylaxis    ALL steroids.      Lyrica [Pregabalin] Shortness Of Breath and Swelling   Penicillins Anaphylaxis    Has patient had a PCN reaction causing immediate rash, facial/tongue/throat swelling, SOB or lightheadedness with hypotension: {Yes Has patient had a PCN reaction causing severe rash involving mucus membranes or skin necrosis: NO Has patient had a PCN reaction that required hospitalization NO Has patient had a PCN reaction occurring within the last 10 years: {Yes If all of the above answers are "NO", then may proceed with Cephalosporin use.    Amitriptyline Other (See Comments)    Muscle Cramps   Duloxetine     Other reaction(s): neurological reaction   Gabapentin Swelling   Latex Rash    PAPER TAPE ONLY    Patient Measurements: Height: 5\' 2"  (157.5 cm) Weight: 83.9 kg (185 lb) IBW/kg (Calculated) : 50.1 kg Heparin Dosing Weight: 69 kg  Vital Signs: Temp: 97.9 F (36.6 C) (06/19 0825) Temp Source: Oral (06/19 0825) BP: 137/86 (06/19 0825) Pulse Rate: 74 (06/19 0825)  Labs: Recent Labs    12/15/20 2112 12/15/20 2300 12/16/20 1328 12/17/20 0234 12/17/20 1621  HGB 13.3  --  14.1  --   --   HCT 41.5  --  44.6  --   --   PLT 270  --  247  --   --   HEPARINUNFRC  --   --   --   --  0.26*  CREATININE 0.70  --  0.76 0.64  --   TROPONINIHS 5 5  --   --   --      Estimated Creatinine Clearance: 75.1 mL/min (by C-G formula based on SCr of 0.64 mg/dL).   Medical History: Past Medical History:  Diagnosis Date   Arthritis    CAD S/P percutaneous coronary angioplasty    a. NSTEMI: s/p DES x2 LAD and DES to bifurcation of OM1 and mid to distal LCx (11/16/14) b. UA/ACS:  overlapping DES x1 to LAD for de novo 99% focal lesion in LAD (12/02/2014)   Chronic back pain    Chronic depression    Coronary artery chronic total  occlusion: 100% RCA CTO 11/16/2014   a. Prox RCA to Mid RCA lesion, 100% stenosed.    Diabetes mellitus, type 2 ([place])    Essential hypertension    Fibromyalgia    History of stomach ulcers    History of tobacco abuse    HLD (hyperlipidemia)    Hypothyroid    Kidney stones    OSA (obstructive sleep apnea)    a. does not wear CPAP   Type IVa MI - Peri-PCI 11/17/2014   Prolonged ischemia during bifurcation PCI of  m-d Cx-OM1 with minicrush technique    Assessment: 60 yo female presents with chest pain, likely unstable angina. The patient has a history of CAD with at least 7 stents being placed. The patient is planned for a cardiac cath on 6/20. PTA the patient is not on anticoagulation. Pharmacy is consulted to dose heparin.   Initial heparin level below goal at 0.26 on 850 units/hr. No bleeding or IV issues noted.   Goal of Therapy:  Heparin level 0.3-0.7 units/ml Monitor platelets by anticoagulation protocol: Yes   Plan:  -Increase IV infusion to 1000 units/hr -Obtain a daily heparin level and CBC -Monitor for signs and symptoms of bleeding  Pilar Plate  Redmond Pulling PharmD., BCPS Clinical Pharmacist 12/17/2020 5:36 PM   Please check [email or web address] for unit-specific pharmacy phone numbers.

## 2020-12-17 NOTE — Progress Notes (Signed)
Progress Note  Patient Name: Jacqueline Munoz Date of Encounter: 12/17/2020  Primary Cardiologist:   None   Subjective   She continues to have chest pain.  However, she had severe headache on NTG IV.    Inpatient Medications    Scheduled Meds:  aspirin  81 mg Oral Daily   [START ON 12/18/2020] aspirin  81 mg Oral Pre-Cath   atorvastatin  40 mg Oral QHS   carvedilol  6.25 mg Oral BID WC   heparin  5,000 Units Subcutaneous Q8H   insulin aspart  0-15 Units Subcutaneous TID WC   insulin detemir  50 Units Subcutaneous BID   losartan  100 mg Oral Daily   sodium chloride flush  3 mL Intravenous Q12H   Continuous Infusions:  sodium chloride     [START ON 12/18/2020] sodium chloride     Followed by   Derrill Memo ON 12/18/2020] sodium chloride     PRN Meds: sodium chloride, acetaminophen, HYDROcodone-acetaminophen, nitroGLYCERIN, ondansetron (ZOFRAN) IV, sodium chloride flush   Vital Signs    Vitals:   12/16/20 1954 12/17/20 0015 12/17/20 0543 12/17/20 0825  BP: 116/77 108/67 104/60 137/86  Pulse: 85 70 (!) 57 74  Resp: 19 16 18 17   Temp: 98 F (36.7 C) 98 F (36.7 C) 98 F (36.7 C) 97.9 F (36.6 C)  TempSrc: Oral Oral Oral Oral  SpO2: 100% 100%  100%  Weight:      Height:        Intake/Output Summary (Last 24 hours) at 12/17/2020 0834 Last data filed at 12/17/2020 0830 Gross per 24 hour  Intake 622.21 ml  Output --  Net 622.21 ml   Filed Weights   12/15/20 2051  Weight: 83.9 kg    Telemetry    NSR, with runs of atrial tachycardia - Personally Reviewed  ECG    NA - Personally Reviewed  Physical Exam   GEN: No acute distress.   Neck: No  JVD Cardiac: RRR, no murmurs, rubs, or gallops.  Respiratory: Clear  to auscultation bilaterally. GI: Soft, nontender, non-distended  MS: No  edema; No deformity. Neuro:  Nonfocal  Psych: Normal affect   Labs    Chemistry Recent Labs  Lab 12/15/20 2112 12/16/20 1328 12/17/20 0234  NA 136  --  137  K 3.9  --  3.5   CL 101  --  104  CO2 24  --  26  GLUCOSE 196*  --  168*  BUN 12  --  13  CREATININE 0.70 0.76 0.64  CALCIUM 9.2  --  8.7*  GFRNONAA >60 >60 >60  ANIONGAP 11  --  7     Hematology Recent Labs  Lab 12/15/20 2112 12/16/20 1328  WBC 10.2 9.5  RBC 4.57 4.91  HGB 13.3 14.1  HCT 41.5 44.6  MCV 90.8 90.8  MCH 29.1 28.7  MCHC 32.0 31.6  RDW 14.2 14.2  PLT 270 247    Cardiac EnzymesNo results for input(s): TROPONINI in the last 168 hours. No results for input(s): TROPIPOC in the last 168 hours.   BNPNo results for input(s): BNP, PROBNP in the last 168 hours.   DDimer No results for input(s): DDIMER in the last 168 hours.   Radiology    DG Chest 1 View  Result Date: 12/15/2020 CLINICAL DATA:  Chest pain, upper extremity weakness EXAM: CHEST  1 VIEW COMPARISON:  02/15/2016 FINDINGS: The heart size and mediastinal contours are within normal limits. Minimal linear bibasilar opacities favor atelectasis. Otherwise,  no focal airspace consolidation, pleural effusion, or pneumothorax. The visualized skeletal structures are unremarkable. IMPRESSION: Minimal bibasilar opacities, likely atelectasis. Electronically Signed   By: Davina Poke D.O.   On: 12/15/2020 21:40   ECHOCARDIOGRAM COMPLETE  Result Date: 12/16/2020    ECHOCARDIOGRAM REPORT   Patient Name:   Jacqueline Munoz Date of Exam: 12/16/2020 Medical Rec #:  [id number]     Height:       62.0 in Accession #:    [id number]    Weight:       185.0 lb Date of Birth:  07-Dec-1960      BSA:          1.849 m Patient Age:    60 years      BP:           135/88 mmHg Patient Gender: F             HR:           61 bpm. Exam Location:  Inpatient Procedure: 2D Echo Indications:    chest pain  History:        Patient has prior history of Echocardiogram examinations, most                 recent 06/12/2015. CAD; Risk Factors:Hypertension, Dyslipidemia,                 Diabetes and Sleep Apnea.  Sonographer:    Johny Chess Referring Phys: [id number] HAO  MENG IMPRESSIONS  1. Left ventricular ejection fraction, by estimation, is 60 to 65%. The left ventricle has normal function. The left ventricle has no regional wall motion abnormalities. There is mild left ventricular hypertrophy. Left ventricular diastolic parameters are consistent with Grade I diastolic dysfunction (impaired relaxation).  2. Right ventricular systolic function is normal. The right ventricular size is normal.  3. The mitral valve is grossly normal. Trivial mitral valve regurgitation.  4. The aortic valve is tricuspid. Aortic valve regurgitation is not visualized.  5. The inferior vena cava is normal in size with <50% respiratory variability, suggesting right atrial pressure of 8 mmHg. FINDINGS  Left Ventricle: Left ventricular ejection fraction, by estimation, is 60 to 65%. The left ventricle has normal function. The left ventricle has no regional wall motion abnormalities. The left ventricular internal cavity size was normal in size. There is  mild left ventricular hypertrophy. Left ventricular diastolic parameters are consistent with Grade I diastolic dysfunction (impaired relaxation). Indeterminate filling pressures. Right Ventricle: The right ventricular size is normal. No increase in right ventricular wall thickness. Right ventricular systolic function is normal. Left Atrium: Left atrial size was normal in size. Right Atrium: Right atrial size was normal in size. Pericardium: There is no evidence of pericardial effusion. Mitral Valve: The mitral valve is grossly normal. Trivial mitral valve regurgitation. Tricuspid Valve: The tricuspid valve is grossly normal. Tricuspid valve regurgitation is trivial. Aortic Valve: The aortic valve is tricuspid. Aortic valve regurgitation is not visualized. Pulmonic Valve: The pulmonic valve was normal in structure. Pulmonic valve regurgitation is not visualized. Aorta: The aortic root and ascending aorta are structurally normal, with no evidence of  dilitation. Venous: The inferior vena cava is normal in size with less than 50% respiratory variability, suggesting right atrial pressure of 8 mmHg. IAS/Shunts: No atrial level shunt detected by color flow Doppler.  LEFT VENTRICLE PLAX 2D LVIDd:         4.40 cm  Diastology LVIDs:         2.90  cm  LV e' medial:    6.53 cm/s LV PW:         1.10 cm  LV E/e' medial:  10.6 LV IVS:        1.10 cm  LV e' lateral:   6.74 cm/s LVOT diam:     1.70 cm  LV E/e' lateral: 10.3 LV SV:         51 LV SV Index:   27 LVOT Area:     2.27 cm  RIGHT VENTRICLE             IVC RV S prime:     11.70 cm/s  IVC diam: 2.00 cm TAPSE (M-mode): 2.0 cm LEFT ATRIUM             Index       RIGHT ATRIUM           Index LA diam:        4.20 cm 2.27 cm/m  RA Area:     11.30 cm LA Vol (A2C):   44.3 ml 23.95 ml/m RA Volume:   24.10 ml  13.03 ml/m LA Vol (A4C):   48.7 ml 26.33 ml/m LA Biplane Vol: 51.2 ml 27.68 ml/m  AORTIC VALVE LVOT Vmax:   96.40 cm/s LVOT Vmean:  64.300 cm/s LVOT VTI:    0.223 m  AORTA Ao Root diam: 3.10 cm Ao Asc diam:  3.70 cm MITRAL VALVE MV Area (PHT): 2.66 cm    SHUNTS MV Decel Time: 285 msec    Systemic VTI:  0.22 m MV E velocity: 69.40 cm/s  Systemic Diam: 1.70 cm MV A velocity: 94.30 cm/s MV E/A ratio:  0.74 Lyman Bishop MD Electronically signed by Lyman Bishop MD Signature Date/Time: 12/16/2020/4:32:24 PM    Final     Cardiac Studies   NA  Patient Profile     60 y.o. female   with CAD, hypertension, hyperlipidemia, DM 2, hypothyroidism and former tobacco use who is being seen 12/16/2020 for the evaluation of chest pain.   Assessment & Plan     Chest pain: Concerning for unstable angina.  Enzymes negative.  Cath tomorrow.  Still with chest pain today but she did not tolerate IV NTG.     Hypertension:   BP OK.  Avoiding increased beta blocker with bradycardia.    Note she does have some SVT but we would like to treat this conservatively.    Hyperlipidemia:    Elevated triglycerides.  Continue current  therapy.  Dietary management should focus on attaining and maintaining a healthy weight and reduction of intake of simple carbohydrates, (<6 percent calories of added sugar and 30 to 35 percent calories of total fat.)  Dietary fat is not a primary source for liver TG, and higher-fat diets do not raise fasting plasma TG levels in most people. Nevertheless, a change in the types of fat (ie, reducing saturated versus poly- and monounsaturated fats) is recommended . I suggest increased consumption of fish that contain high amounts of omega-3 fatty acids (baked not fried.)   discussed today with patient.    She recently was out of her Lipitor and is restarted    DM2:  A1C is pending.       Hypothyroidism:  Check TSH.  She said that she had been off of her synthroid until recently.     Former tobacco use: Quit.      SVT: noted to have short bursts of SVT on telemetry, will increase coreg For questions  or updates, please contact [place] Please consult [email or web address] for contact info under Cardiology/STEMI.   Signed, Minus Breeding, MD  12/17/2020, 8:34 AM

## 2020-12-18 ENCOUNTER — Encounter (HOSPITAL_COMMUNITY)
Admission: EM | Disposition: A | Discharge status: Home / Self Care | Payer: Self-pay | Source: Home / Self Care | Attending: Cardiology

## 2020-12-18 DIAGNOSIS — I200 Unstable angina: Secondary | ICD-10-CM | POA: Diagnosis not present

## 2020-12-18 DIAGNOSIS — E782 Mixed hyperlipidemia: Secondary | ICD-10-CM | POA: Diagnosis not present

## 2020-12-18 DIAGNOSIS — E039 Hypothyroidism, unspecified: Secondary | ICD-10-CM | POA: Diagnosis not present

## 2020-12-18 DIAGNOSIS — I2510 Atherosclerotic heart disease of native coronary artery without angina pectoris: Secondary | ICD-10-CM

## 2020-12-18 HISTORY — PX: LEFT HEART CATH AND CORONARY ANGIOGRAPHY: CATH118249

## 2020-12-18 LAB — CBC
HCT: 42.8 % (ref 36.0–46.0)
Hemoglobin: 14 g/dL (ref 12.0–15.0)
MCH: 29.2 pg (ref 26.0–34.0)
MCHC: 32.7 g/dL (ref 30.0–36.0)
MCV: 89.4 fL (ref 80.0–100.0)
Platelets: 206 10*3/uL (ref 150–400)
RBC: 4.79 MIL/uL (ref 3.87–5.11)
RDW: 14 % (ref 11.5–15.5)
WBC: 8 10*3/uL (ref 4.0–10.5)
nRBC: 0 % (ref 0.0–0.2)

## 2020-12-18 LAB — GLUCOSE, CAPILLARY
Glucose-Capillary: 156 mg/dL — ABNORMAL HIGH (ref 70–99)
Glucose-Capillary: 95 mg/dL (ref 70–99)

## 2020-12-18 LAB — BASIC METABOLIC PANEL
Anion gap: 7 (ref 5–15)
BUN: 13 mg/dL (ref 6–20)
CO2: 25 mmol/L (ref 22–32)
Calcium: 8.7 mg/dL — ABNORMAL LOW (ref 8.9–10.3)
Chloride: 104 mmol/L (ref 98–111)
Creatinine, Ser: 0.6 mg/dL (ref 0.44–1.00)
GFR, Estimated: >60 mL/min (ref >60)
Glucose, Bld: 131 mg/dL — ABNORMAL HIGH (ref 70–99)
Potassium: 4 mmol/L (ref 3.5–5.1)
Sodium: 136 mmol/L (ref 135–145)

## 2020-12-18 LAB — HEPARIN LEVEL (UNFRACTIONATED): Heparin Unfractionated: 0.24 IU/mL — ABNORMAL LOW (ref 0.30–0.70)

## 2020-12-18 LAB — HEMOGLOBIN A1C
Hgb A1c MFr Bld: 9.8 % — ABNORMAL HIGH (ref 4.8–5.6)
Hgb A1c MFr Bld: 9.5 % — ABNORMAL HIGH (ref 4.8–5.6)
Mean Plasma Glucose: 235 mg/dL
Mean Plasma Glucose: 226 mg/dL

## 2020-12-18 SURGERY — LEFT HEART CATH AND CORONARY ANGIOGRAPHY
Anesthesia: LOCAL

## 2020-12-18 MED ORDER — HEPARIN SODIUM (PORCINE) 1000 UNIT/ML IJ SOLN
INTRAMUSCULAR | Status: DC | PRN
  Administered 2020-12-18: 5000 [IU] via INTRAVENOUS

## 2020-12-18 MED ORDER — SODIUM CHLORIDE 0.9 % IV SOLN: INTRAVENOUS | Status: AC

## 2020-12-18 MED ORDER — SODIUM CHLORIDE 0.9% FLUSH: 3.0000 mL | INTRAVENOUS | Status: DC | PRN

## 2020-12-18 MED ORDER — LIDOCAINE HCL (PF) 1 % IJ SOLN
INTRAMUSCULAR | Status: AC
  Filled 2020-12-18: qty 30

## 2020-12-18 MED ORDER — LEVOTHYROXINE SODIUM 200 MCG PO TABS: 200.0000 ug | ORAL_TABLET | Freq: Every day | ORAL | 3 refills | Status: AC

## 2020-12-18 MED ORDER — LIDOCAINE HCL (PF) 1 % IJ SOLN
INTRAMUSCULAR | Status: DC | PRN
  Administered 2020-12-18: 3 mL via SUBCUTANEOUS

## 2020-12-18 MED ORDER — VERAPAMIL HCL 2.5 MG/ML IV SOLN
INTRAVENOUS | Status: AC
  Filled 2020-12-18: qty 2

## 2020-12-18 MED ORDER — IOHEXOL 350 MG/ML SOLN
INTRAVENOUS | Status: DC | PRN
  Administered 2020-12-18: 40 mL via INTRA_ARTERIAL

## 2020-12-18 MED ORDER — MIDAZOLAM HCL 2 MG/2ML IJ SOLN
INTRAMUSCULAR | Status: AC
  Filled 2020-12-18: qty 2

## 2020-12-18 MED ORDER — HYDRALAZINE HCL 20 MG/ML IJ SOLN: 10.0000 mg | INTRAMUSCULAR | Status: DC | PRN

## 2020-12-18 MED ORDER — FENTANYL CITRATE (PF) 100 MCG/2ML IJ SOLN
INTRAMUSCULAR | Status: DC | PRN
  Administered 2020-12-18: 25 ug via INTRAVENOUS

## 2020-12-18 MED ORDER — SODIUM CHLORIDE 0.9 % IV SOLN: 250.0000 mL | INTRAVENOUS | Status: DC | PRN

## 2020-12-18 MED ORDER — HEPARIN (PORCINE) IN NACL 1000-0.9 UT/500ML-% IV SOLN
INTRAVENOUS | Status: AC
  Filled 2020-12-18: qty 1000

## 2020-12-18 MED ORDER — LABETALOL HCL 5 MG/ML IV SOLN: 10.0000 mg | INTRAVENOUS | Status: DC | PRN

## 2020-12-18 MED ORDER — HEPARIN SODIUM (PORCINE) 1000 UNIT/ML IJ SOLN
INTRAMUSCULAR | Status: AC
  Filled 2020-12-18: qty 1

## 2020-12-18 MED ORDER — MIDAZOLAM HCL 2 MG/2ML IJ SOLN
INTRAMUSCULAR | Status: DC | PRN
  Administered 2020-12-18: 2 mg via INTRAVENOUS

## 2020-12-18 MED ORDER — HEPARIN (PORCINE) IN NACL 1000-0.9 UT/500ML-% IV SOLN
INTRAVENOUS | Status: DC | PRN
  Administered 2020-12-18 (×2): 500 mL

## 2020-12-18 MED ORDER — SODIUM CHLORIDE 0.9% FLUSH
3.0000 mL | Freq: Two times a day (BID) | INTRAVENOUS | Status: DC
  Administered 2020-12-18: 3 mL via INTRAVENOUS

## 2020-12-18 MED ORDER — LIVING WELL WITH DIABETES BOOK
Freq: Once | Status: AC
  Filled 2020-12-18 (×2): qty 1

## 2020-12-18 MED ORDER — FENTANYL CITRATE (PF) 100 MCG/2ML IJ SOLN
INTRAMUSCULAR | Status: AC
  Filled 2020-12-18: qty 2

## 2020-12-18 MED ORDER — LEVOTHYROXINE SODIUM 100 MCG PO TABS
200.0000 ug | ORAL_TABLET | Freq: Every day | ORAL | Status: DC
  Administered 2020-12-18: 200 ug via ORAL
  Filled 2020-12-18: qty 2

## 2020-12-18 MED ORDER — VERAPAMIL HCL 2.5 MG/ML IV SOLN
INTRAVENOUS | Status: DC | PRN
  Administered 2020-12-18: 10 mL via INTRA_ARTERIAL

## 2020-12-18 SURGICAL SUPPLY — 9 items
CATH 5FR JL3.5 JR4 ANG PIG MP (CATHETERS) ×2 IMPLANT
DEVICE RAD COMP TR BAND LRG (VASCULAR PRODUCTS) ×2 IMPLANT
GLIDESHEATH SLEND SS 6F .021 (SHEATH) ×2 IMPLANT
GUIDEWIRE INQWIRE 1.5J.035X260 (WIRE) ×1 IMPLANT
INQWIRE 1.5J .035X260CM (WIRE) ×2
KIT HEART LEFT (KITS) ×2 IMPLANT
PACK CARDIAC CATHETERIZATION (CUSTOM PROCEDURE TRAY) ×2 IMPLANT
TRANSDUCER W/STOPCOCK (MISCELLANEOUS) ×2 IMPLANT
TUBING CIL FLEX 10 FLL-RA (TUBING) ×2 IMPLANT

## 2020-12-18 NOTE — Progress Notes (Signed)
ANTICOAGULATION CONSULT NOTE   Pharmacy Consult for Heparin Indication: chest pain/ACS  Allergies  Allergen Reactions   Corticosteroids Anaphylaxis    ALL steroids.      Lyrica [Pregabalin] Shortness Of Breath and Swelling   Penicillins Anaphylaxis    Has patient had a PCN reaction causing immediate rash, facial/tongue/throat swelling, SOB or lightheadedness with hypotension: {Yes Has patient had a PCN reaction causing severe rash involving mucus membranes or skin necrosis: NO Has patient had a PCN reaction that required hospitalization NO Has patient had a PCN reaction occurring within the last 10 years: {Yes If all of the above answers are "NO", then may proceed with Cephalosporin use.    Amitriptyline Other (See Comments)    Muscle Cramps   Duloxetine     Other reaction(s): neurological reaction   Gabapentin Swelling   Latex Rash    PAPER TAPE ONLY    Patient Measurements: Height: 5\' 2"  (157.5 cm) Weight: 83.9 kg (185 lb) IBW/kg (Calculated) : 50.1 kg Heparin Dosing Weight: 69 kg  Vital Signs: Temp: 98.2 F (36.8 C) (06/20 0430) Temp Source: Oral (06/20 0430) BP: 111/61 (06/20 0430) Pulse Rate: 52 (06/20 0430)  Labs: Recent Labs    12/15/20 2112 12/15/20 2300 12/16/20 1328 12/17/20 0234 12/17/20 1621 12/18/20 1008  HGB 13.3  --  14.1  --   --  14.0  HCT 41.5  --  44.6  --   --  42.8  PLT 270  --  247  --   --  206  HEPARINUNFRC  --   --   --   --  0.26* 0.24*  CREATININE 0.70  --  0.76 0.64  --  0.60  TROPONINIHS 5 5  --   --   --   --      Estimated Creatinine Clearance: 75.1 mL/min (by C-G formula based on SCr of 0.6 mg/dL).   Medical History: Past Medical History:  Diagnosis Date   Arthritis    CAD S/P percutaneous coronary angioplasty    a. NSTEMI: s/p DES x2 LAD and DES to bifurcation of OM1 and mid to distal LCx (11/16/14) b. UA/ACS:  overlapping DES x1 to LAD for de novo 99% focal lesion in LAD (12/02/2014)   Chronic back pain    Chronic  depression    Coronary artery chronic total occlusion: 100% RCA CTO 11/16/2014   a. Prox RCA to Mid RCA lesion, 100% stenosed.    Diabetes mellitus, type 2 ([place])    Essential hypertension    Fibromyalgia    History of stomach ulcers    History of tobacco abuse    HLD (hyperlipidemia)    Hypothyroid    Kidney stones    OSA (obstructive sleep apnea)    a. does not wear CPAP   Type IVa MI - Peri-PCI 11/17/2014   Prolonged ischemia during bifurcation PCI of  m-d Cx-OM1 with minicrush technique    Assessment: 60 yo female presents with chest pain, likely unstable angina. The patient has a history of CAD with at least 7 stents being placed. The patient is planned for a cardiac cath on 6/20. PTA the patient is not on anticoagulation. Pharmacy is consulted to dose heparin.   Heparin level below goal at 0.24 on 1000 units/hr. No bleeding or IV issues noted. Plan for cath today.  Goal of Therapy:  Heparin level 0.3-0.7 units/ml Monitor platelets by anticoagulation protocol: Yes   Plan:  -Increase IV infusion to 1100 units/hr -Follow up after cath  Erin Hearing PharmD., BCPS Clinical Pharmacist 12/18/2020 12:40 PM   Please check [email or web address] for unit-specific pharmacy phone numbers.

## 2020-12-18 NOTE — Interval H&P Note (Signed)
Cath Lab Visit (complete for each Cath Lab visit)  Clinical Evaluation Leading to the Procedure:   ACS: Yes.    Non-ACS:    Anginal Classification: CCS IV  Anti-ischemic medical therapy: Minimal Therapy (1 class of medications)  Non-Invasive Test Results: No non-invasive testing performed  Prior CABG: No previous CABG      History and Physical Interval Note:  12/18/2020 1:52 PM  Jacqueline Munoz  has presented today for surgery, with the diagnosis of unstable angina.  The various methods of treatment have been discussed with the patient and family. After consideration of risks, benefits and other options for treatment, the patient has consented to  Procedure(s): LEFT HEART CATH AND CORONARY ANGIOGRAPHY (N/A) as a surgical intervention.  The patient's history has been reviewed, patient examined, no change in status, stable for surgery.  I have reviewed the patient's chart and labs.  Questions were answered to the patient's satisfaction.     Sherren Mocha

## 2020-12-18 NOTE — Discharge Summary (Signed)
Discharge Summary    Patient ID: Jacqueline Munoz MRN: [id number]; DOB: 02/12/1961  Admit date: 12/15/2020 Discharge date: 12/18/2020  PCP:  Merryl Hacker No   [facility] Providers Cardiologist:  Glenetta Hew, MD      Discharge Diagnoses    Principal Problem:   Unstable angina [facility]) Active Problems:   Essential hypertension   Hypothyroid   Mixed hyperlipidemia  Diagnostic Studies/Procedures    Echo: 12/16/20  IMPRESSIONS     1. Left ventricular ejection fraction, by estimation, is 60 to 65%. The  left ventricle has normal function. The left ventricle has no regional  wall motion abnormalities. There is mild left ventricular hypertrophy.  Left ventricular diastolic parameters  are consistent with Grade I diastolic dysfunction (impaired relaxation).   2. Right ventricular systolic function is normal. The right ventricular  size is normal.   3. The mitral valve is grossly normal. Trivial mitral valve  regurgitation.   4. The aortic valve is tricuspid. Aortic valve regurgitation is not  visualized.   5. The inferior vena cava is normal in size with <50% respiratory  variability, suggesting right atrial pressure of 8 mmHg.   Cath: 12/18/20  Non-stenotic Mid LAD to Dist LAD lesion was previously treated. Non-stenotic Mid LAD lesion was previously treated. Non-stenotic Mid Cx to Dist Cx lesion was previously treated. Non-stenotic Ost 1st Mrg to 1st Mrg lesion was previously treated. Prox RCA to Mid RCA lesion is 100% stenosed. Prox LAD to Mid LAD lesion is 20% stenosed. Prox Cx to Mid Cx lesion is 20% stenosed. Previously placed 1st Mrg stent (unknown type) is widely patent.   1.  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  Continued patency of the stented segment in the proximal and mid LAD with mild in-stent restenosis, no flow-limiting disease noted 3.  Significant ostial stenosis of a small diagonal branch jailed by the  stented segment of LAD, appropriate for medical therapy 4.  Continued patency of the stented segment in the proximal, mid, and distal circumflex as well as the obtuse marginal branch without significant restenosis   Recommend: medical therapy  Diagnostic Dominance: Right   _____________   History of Present Illness     Jacqueline Munoz is a 60 y.o. female with  past medical history of CAD, hypertension, hyperlipidemia, DM 2, hypothyroidism and former tobacco use.  She had multiple PCI's in the past.  Last cardiac catheterization was performed in 2016 in the setting of NSTEMI.  She had bifurcation PCI of left circumflex-OM and then staged PCI of proximal LAD overlapping the mid LAD stent.  She has chronically occluded RCA.  She was converted from Brilinta to Plavix due to financial reasons.  Echocardiogram in December 2016 showed EF 55 to 60%, grade 2 DD, mild MR.  Since then, she moved to [place] and had another 2 stents placed in [place] (unknown vessels, [facility]) about 4 years ago.  About 2 years ago, she finally quit smoking after her friend died of lung cancer.  She has since moved back to [place].  About 2 weeks ago, she was seen in [place] ED due to asthma exacerbation.  She had never been diagnosed with COPD despite years of smoking.   She was in her usual state of health until 2 days prior, she started having a substernal chest pressure radiating down the bilateral arms.  This occurred both at rest and with exertion but more noticeable when she exerted herself.  The characteristic of the chest pain was very similar to the previous angina.  She eventually sought medical attention at [facility].  Serial troponin was negative.  Renal function and electrolyte normal.  Hemoglobin normal.  Chest x-ray showed minimal bibasilar atelectasis without acute focal opacity.  EKG obtained in the ED showed sinus rhythm with right bundle branch block, no clear ST  elevation or depression.  When compared to the previous EKG, previous EKG obtained in August 2017 still showed a narrow QRS with significant bradycardia. She was admitted to cardiology service for further management.   Hospital Course     Chest pain: hsTn negative, symptoms concerning for unstable angina. Underwent cardiac cath noted above with CTO of the RCA with left to right collaterals, continued patency of stented segment of the p/mLAD with mild ISR, not flow limiting. Lesion in the ostium of small diagonal branch, rec's for medical therapy. No culprit lesion noted.  -- continue ASA, plavix, lipitor $RemoveBeforeD'40mg'vghYgoHVnetGgd$  daily, coreg 3.125 mg BID, losartan $RemoveBefor'100mg'ywbmQqzjbBpI$  daily   Hypertension: stable with coreg 6.$RemoveBefo'25mg'WBmdnEkiUgK$  BID, and losartan $RemoveBefo'100mg'VmECFQjesyh$  daily   Hyperlipidemia: LDL 61, Trig 241 -- on high dose statin   DM: Hgb A1c 9.8 -- PTA meds continued at discharge of jardiance and Levemir. Not on metformin PTA 2/2 GI issues   Hypothyroidism: TSH 37, reports she had ran out of her synthroid and recently restarted about a week prior to admission. -- continue synthroid 200 mcg daily   Hx of tobacco use: has abstained   Asthma: no wheezing on exam   SVT: has had some short bursts on telemetry -- continue BB therapy  Patient seen by Dr. Claiborne Billings and deemed stable for discharge home. Follow up in the office has been arranged.   Did the patient have an acute coronary syndrome (MI, NSTEMI, STEMI, etc) this admission?:  No                               Did the patient have a percutaneous coronary intervention (stent / angioplasty)?:  No.       _____________  Discharge Vitals Blood pressure 110/84, pulse 81, temperature 98.2 F (36.8 C), temperature source Oral, resp. rate 18, height $RemoveBe'5\' 2"'FOmcsxcEm$  (1.575 m), weight 83.9 kg, SpO2 93 %.  Filed Weights   12/15/20 2051  Weight: 83.9 kg    Labs & Radiologic Studies    CBC Recent Labs    12/15/20 2112 12/16/20 1328 12/18/20 1008  WBC 10.2 9.5 8.0  NEUTROABS 6.2  --   --    HGB 13.3 14.1 14.0  HCT 41.5 44.6 42.8  MCV 90.8 90.8 89.4  PLT 270 247 248   Basic Metabolic Panel Recent Labs    12/17/20 0234 12/18/20 1008  NA 137 136  K 3.5 4.0  CL 104 104  CO2 26 25  GLUCOSE 168* 131*  BUN 13 13  CREATININE 0.64 0.60  CALCIUM 8.7* 8.7*   Liver Function Tests No results for input(s): AST, ALT, ALKPHOS, BILITOT, PROT, ALBUMIN in the last 72 hours. No results for input(s): LIPASE, AMYLASE in the last 72 hours. High Sensitivity Troponin:   Recent Labs  Lab 12/15/20 2112 12/15/20 2300  TROPONINIHS 5 5    BNP Invalid input(s): POCBNP D-Dimer No results for input(s): DDIMER in the last 72 hours. Hemoglobin A1C Recent Labs    12/16/20 1328  HGBA1C 9.8*   Fasting Lipid Panel Recent Labs  12/17/20 0234  CHOL 149  HDL 40*  LDLCALC 61  TRIG 241*  CHOLHDL 3.7   Thyroid Function Tests Recent Labs    12/17/20 1621  TSH 37.448*   _____________  DG Chest 1 View  Result Date: 12/15/2020 CLINICAL DATA:  Chest pain, upper extremity weakness EXAM: CHEST  1 VIEW COMPARISON:  02/15/2016 FINDINGS: The heart size and mediastinal contours are within normal limits. Minimal linear bibasilar opacities favor atelectasis. Otherwise, no focal airspace consolidation, pleural effusion, or pneumothorax. The visualized skeletal structures are unremarkable. IMPRESSION: Minimal bibasilar opacities, likely atelectasis. Electronically Signed   By: Davina Poke D.O.   On: 12/15/2020 21:40   CARDIAC CATHETERIZATION  Result Date: 12/18/2020  Non-stenotic Mid LAD to Dist LAD lesion was previously treated.  Non-stenotic Mid LAD lesion was previously treated.  Non-stenotic Mid Cx to Dist Cx lesion was previously treated.  Non-stenotic Ost 1st Mrg to 1st Mrg lesion was previously treated.  Prox RCA to Mid RCA lesion is 100% stenosed.  Prox LAD to Mid LAD lesion is 20% stenosed.  Prox Cx to Mid Cx lesion is 20% stenosed.  Previously placed 1st Mrg stent (unknown  type) is widely patent.  1.  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  Continued patency of the stented segment in the proximal and mid LAD with mild in-stent restenosis, no flow-limiting disease noted 3.  Significant ostial stenosis of a small diagonal branch jailed by the stented segment of LAD, appropriate for medical therapy 4.  Continued patency of the stented segment in the proximal, mid, and distal circumflex as well as the obtuse marginal branch without significant restenosis Recommend: medical therapy   ECHOCARDIOGRAM COMPLETE  Result Date: 12/16/2020    ECHOCARDIOGRAM REPORT   Patient Name:   Jacqueline Munoz Date of Exam: 12/16/2020 Medical Rec #:  [id number]     Height:       62.0 in Accession #:    [id number]    Weight:       185.0 lb Date of Birth:  [birth year]      BSA:          1.849 m Patient Age:    31 years      BP:           135/88 mmHg Patient Gender: F             HR:           61 bpm. Exam Location:  Inpatient Procedure: 2D Echo Indications:    chest pain  History:        Patient has prior history of Echocardiogram examinations, most                 recent 06/12/2015. CAD; Risk Factors:Hypertension, Dyslipidemia,                 Diabetes and Sleep Apnea.  Sonographer:    Johny Chess Referring Phys: [id number] HAO MENG IMPRESSIONS  1. Left ventricular ejection fraction, by estimation, is 60 to 65%. The left ventricle has normal function. The left ventricle has no regional wall motion abnormalities. There is mild left ventricular hypertrophy. Left ventricular diastolic parameters are consistent with Grade I diastolic dysfunction (impaired relaxation).  2. Right ventricular systolic function is normal. The right ventricular size is normal.  3. The mitral valve is grossly normal. Trivial mitral valve regurgitation.  4. The aortic valve is tricuspid. Aortic valve regurgitation is not visualized.  5. The inferior  vena cava is normal in size with  <50% respiratory variability, suggesting right atrial pressure of 8 mmHg. FINDINGS  Left Ventricle: Left ventricular ejection fraction, by estimation, is 60 to 65%. The left ventricle has normal function. The left ventricle has no regional wall motion abnormalities. The left ventricular internal cavity size was normal in size. There is  mild left ventricular hypertrophy. Left ventricular diastolic parameters are consistent with Grade I diastolic dysfunction (impaired relaxation). Indeterminate filling pressures. Right Ventricle: The right ventricular size is normal. No increase in right ventricular wall thickness. Right ventricular systolic function is normal. Left Atrium: Left atrial size was normal in size. Right Atrium: Right atrial size was normal in size. Pericardium: There is no evidence of pericardial effusion. Mitral Valve: The mitral valve is grossly normal. Trivial mitral valve regurgitation. Tricuspid Valve: The tricuspid valve is grossly normal. Tricuspid valve regurgitation is trivial. Aortic Valve: The aortic valve is tricuspid. Aortic valve regurgitation is not visualized. Pulmonic Valve: The pulmonic valve was normal in structure. Pulmonic valve regurgitation is not visualized. Aorta: The aortic root and ascending aorta are structurally normal, with no evidence of dilitation. Venous: The inferior vena cava is normal in size with less than 50% respiratory variability, suggesting right atrial pressure of 8 mmHg. IAS/Shunts: No atrial level shunt detected by color flow Doppler.  LEFT VENTRICLE PLAX 2D LVIDd:         4.40 cm  Diastology LVIDs:         2.90 cm  LV e' medial:    6.53 cm/s LV PW:         1.10 cm  LV E/e' medial:  10.6 LV IVS:        1.10 cm  LV e' lateral:   6.74 cm/s LVOT diam:     1.70 cm  LV E/e' lateral: 10.3 LV SV:         51 LV SV Index:   27 LVOT Area:     2.27 cm  RIGHT VENTRICLE             IVC RV S prime:     11.70 cm/s  IVC diam: 2.00 cm TAPSE (M-mode): 2.0 cm LEFT ATRIUM              Index       RIGHT ATRIUM           Index LA diam:        4.20 cm 2.27 cm/m  RA Area:     11.30 cm LA Vol (A2C):   44.3 ml 23.95 ml/m RA Volume:   24.10 ml  13.03 ml/m LA Vol (A4C):   48.7 ml 26.33 ml/m LA Biplane Vol: 51.2 ml 27.68 ml/m  AORTIC VALVE LVOT Vmax:   96.40 cm/s LVOT Vmean:  64.300 cm/s LVOT VTI:    0.223 m  AORTA Ao Root diam: 3.10 cm Ao Asc diam:  3.70 cm MITRAL VALVE MV Area (PHT): 2.66 cm    SHUNTS MV Decel Time: 285 msec    Systemic VTI:  0.22 m MV E velocity: 69.40 cm/s  Systemic Diam: 1.70 cm MV A velocity: 94.30 cm/s MV E/A ratio:  0.74 Lyman Bishop MD Electronically signed by Lyman Bishop MD Signature Date/Time: 12/16/2020/4:32:24 PM    Final    Disposition   Pt is being discharged home today in good condition.  Follow-up Plans & Appointments     Follow-up [facility] Follow up on 01/04/2021.   Specialty:  Cardiology Why: at 2pm for your follow up appt with Dr. Elissa Hefty NP Contact information: [address] [place] [address] [phone]        PCP Follow up.   Why: Please continue close follow up with your PCP regarding ongoing management of your diabetes and hypothyroidism               Discharge Instructions     Call MD for:  difficulty breathing, headache or visual disturbances   Complete by: As directed    Call MD for:  redness, tenderness, or signs of infection (pain, swelling, redness, odor or green/yellow discharge around incision site)   Complete by: As directed    Diet - low sodium heart healthy   Complete by: As directed    Discharge instructions   Complete by: As directed    Radial Site Care Refer to this sheet in the next few weeks. These instructions provide you with information on caring for yourself after your procedure. Your caregiver may also give you more specific instructions. Your treatment has been planned according to current medical  practices, but problems sometimes occur. Call your caregiver if you have any problems or questions after your procedure. HOME CARE INSTRUCTIONS You may shower the day after the procedure. Remove the bandage (dressing) and gently wash the site with plain soap and water. Gently pat the site dry.  Do not apply powder or lotion to the site.  Do not submerge the affected site in water for 3 to 5 days.  Inspect the site at least twice daily.  Do not flex or bend the affected arm for 24 hours.  No lifting over 5 pounds (2.3 kg) for 5 days after your procedure.  Do not drive home if you are discharged the same day of the procedure. Have someone else drive you.  You may drive 24 hours after the procedure unless otherwise instructed by your caregiver.  What to expect: Any bruising will usually fade within 1 to 2 weeks.  Blood that collects in the tissue (hematoma) may be painful to the touch. It should usually decrease in size and tenderness within 1 to 2 weeks.  SEEK IMMEDIATE MEDICAL CARE IF: You have unusual pain at the radial site.  You have redness, warmth, swelling, or pain at the radial site.  You have drainage (other than a small amount of blood on the dressing).  You have chills.  You have a fever or persistent symptoms for more than 72 hours.  You have a fever and your symptoms suddenly get worse.  Your arm becomes pale, cool, tingly, or numb.  You have heavy bleeding from the site. Hold pressure on the site.   Increase activity slowly   Complete by: As directed        Discharge Medications   Allergies as of 12/18/2020       Reactions   Corticosteroids Anaphylaxis   ALL steroids.      Lyrica [pregabalin] Shortness Of Breath, Swelling   Penicillins Anaphylaxis   Has patient had a PCN reaction causing immediate rash, facial/tongue/throat swelling, SOB or lightheadedness with hypotension: {Yes Has patient had a PCN reaction causing severe rash involving mucus membranes or skin  necrosis: NO Has patient had a PCN reaction that required hospitalization NO Has patient had a PCN reaction occurring within the last 10 years: {Yes If all of the above answers are "NO", then may proceed with Cephalosporin use.   Amitriptyline Other (See Comments)  Muscle Cramps   Duloxetine    Other reaction(s): neurological reaction   Gabapentin Swelling   Latex Rash   PAPER TAPE ONLY        Medication List     STOP taking these medications    FLUoxetine 20 MG capsule Commonly known as: PROZAC   insulin aspart 100 UNIT/ML injection Commonly known as: novoLOG   isosorbide mononitrate 30 MG 24 hr tablet Commonly known as: IMDUR   metFORMIN 500 MG tablet Commonly known as: GLUCOPHAGE   nicotine 14 mg/24hr patch Commonly known as: NICODERM CQ - dosed in mg/24 hours   Potassium Chloride ER 20 MEQ Tbcr   ranitidine 150 MG tablet Commonly known as: Zantac       TAKE these medications    acetaminophen 500 MG chewable tablet Commonly known as: TYLENOL Chew 1,000 mg by mouth every 6 (six) hours as needed for pain.   aspirin 81 MG EC tablet Take 1 tablet (81 mg total) by mouth daily.   atorvastatin 40 MG tablet Commonly known as: LIPITOR Take 40 mg by mouth at bedtime. What changed: Another medication with the same name was removed. Continue taking this medication, and follow the directions you see here.   clopidogrel 75 MG tablet Commonly known as: PLAVIX TAKE ONE TABLET BY MOUTH ONCE DAILY   empagliflozin 25 MG Tabs tablet Commonly known as: JARDIANCE Take 25 mg by mouth daily.   gabapentin 300 MG capsule Commonly known as: NEURONTIN Take 300 mg by mouth 4 (four) times daily.   glucose monitoring kit monitoring kit 1 each by Does not apply route 4 (four) times daily - after meals and at bedtime. 1 month Diabetic Testing Supplies for QAC-QHS accuchecks.   ibuprofen 200 MG tablet Commonly known as: ADVIL Take 800 mg by mouth every 6 (six) hours as  needed for moderate pain or headache.   Insulin Syringe-Needle U-100 30G X 5/16" 0.5 ML Misc Commonly known as: TRUEplus Insulin Syringe 1 each by Does not apply route 4 (four) times daily.   isosorbide dinitrate 30 MG tablet Commonly known as: ISORDIL Take 30 mg by mouth every 6 (six) hours.   levothyroxine 200 MCG tablet Commonly known as: SYNTHROID Take 1 tablet (200 mcg total) by mouth daily before breakfast. What changed: additional instructions   linaclotide 72 MCG capsule Commonly known as: LINZESS Take 72 mcg by mouth daily before breakfast.   losartan 100 MG tablet Commonly known as: COZAAR Take 100 mg by mouth daily.   nitroGLYCERIN 0.4 MG SL tablet Commonly known as: NITROSTAT Place 0.4 mg under the tongue every 5 (five) minutes as needed for chest pain (may take three doses by mouth per day as needed).   ondansetron 8 MG disintegrating tablet Commonly known as: Zofran ODT Take 1 tablet (8 mg total) by mouth every 8 (eight) hours as needed for nausea or vomiting.       ASK your doctor about these medications    carvedilol 3.125 MG tablet Commonly known as: COREG TAKE 1 TABLET BY MOUTH 2 TIMES DAILY WITH A MEAL.   diclofenac Sodium 1 % Gel Commonly known as: Voltaren Apply 2 g topically 4 (four) times daily.   furosemide 40 MG tablet Commonly known as: LASIX Take 1 tablet (40 mg total) by mouth daily.   insulin detemir 100 UNIT/ML FlexPen Commonly known as: LEVEMIR INJECT 35 UNITS INTO THE SKIN AT BEDTIME.   LORazepam 1 MG tablet Commonly known as: ATIVAN TAKE ONE TABLET BY MOUTH  ONCE DAILY          Outstanding Labs/Studies   N/a   Duration of Discharge Encounter   Greater than 30 minutes including physician time.  Signed, Reino Bellis, NP 12/18/2020, 3:52 PM

## 2020-12-18 NOTE — Progress Notes (Addendum)
Progress Note  Patient Name: Jacqueline Munoz Date of Encounter: 12/18/2020  [facility] Cardiologist: None   Subjective   No complaints this morning.   Inpatient Medications    Scheduled Meds:  aspirin  81 mg Oral Daily   atorvastatin  40 mg Oral QHS   carvedilol  6.25 mg Oral BID WC   insulin aspart  0-15 Units Subcutaneous TID WC   insulin detemir  50 Units Subcutaneous BID   losartan  100 mg Oral Daily   sodium chloride flush  3 mL Intravenous Q12H   Continuous Infusions:  sodium chloride     sodium chloride 1 mL/kg/hr (12/18/20 0719)   heparin 1,000 Units/hr (12/18/20 0615)   PRN Meds: sodium chloride, acetaminophen, ALPRAZolam, HYDROcodone-acetaminophen, nitroGLYCERIN, ondansetron (ZOFRAN) IV, sodium chloride flush   Vital Signs    Vitals:   12/17/20 0543 12/17/20 0825 12/17/20 2007 12/18/20 0430  BP: 104/60 137/86 131/77 111/61  Pulse: (!) 57 74 100 (!) 52  Resp: 18 17 16 17   Temp: 98 F (36.7 C) 97.9 F (36.6 C) 97.9 F (36.6 C) 98.2 F (36.8 C)  TempSrc: Oral Oral Oral Oral  SpO2:  100% 100% 100%  Weight:      Height:        Intake/Output Summary (Last 24 hours) at 12/18/2020 0839 Last data filed at 12/18/2020 0314 Gross per 24 hour  Intake 442.79 ml  Output --  Net 442.79 ml   Last 3 Weights 12/15/2020 11/13/2019 02/23/2016  Weight (lbs) 185 lb 200 lb 179 lb  Weight (kg) 83.915 kg 90.719 kg 81.194 kg      Telemetry   SR with SB at times - Personally Reviewed  ECG    No new tracing  Physical Exam   GEN: No acute distress.   Neck: No JVD Cardiac: RRR, no murmurs, rubs, or gallops.  Respiratory: Clear to auscultation bilaterally. GI: Soft, nontender, non-distended  MS: No edema; No deformity. Neuro:  Nonfocal  Psych: Normal affect   Labs    High Sensitivity Troponin:   Recent Labs  Lab 12/15/20 2112 12/15/20 2300  TROPONINIHS 5 5      Chemistry Recent Labs  Lab 12/15/20 2112 12/16/20 1328 12/17/20 0234  NA 136  --   137  K 3.9  --  3.5  CL 101  --  104  CO2 24  --  26  GLUCOSE 196*  --  168*  BUN 12  --  13  CREATININE 0.70 0.76 0.64  CALCIUM 9.2  --  8.7*  GFRNONAA >60 >60 >60  ANIONGAP 11  --  7     Hematology Recent Labs  Lab 12/15/20 2112 12/16/20 1328  WBC 10.2 9.5  RBC 4.57 4.91  HGB 13.3 14.1  HCT 41.5 44.6  MCV 90.8 90.8  MCH 29.1 28.7  MCHC 32.0 31.6  RDW 14.2 14.2  PLT 270 247    BNPNo results for input(s): BNP, PROBNP in the last 168 hours.   DDimer No results for input(s): DDIMER in the last 168 hours.   Radiology    ECHOCARDIOGRAM COMPLETE  Result Date: 12/16/2020    ECHOCARDIOGRAM REPORT   Patient Name:   Jakyia Brillhart Date of Exam: 12/16/2020 Medical Rec #:  [id number]     Height:       62.0 in Accession #:    [id number]    Weight:       185.0 lb Date of Birth:  January 28, 1961      BSA:  1.849 m Patient Age:    60 years      BP:           135/88 mmHg Patient Gender: F             HR:           61 bpm. Exam Location:  Inpatient Procedure: 2D Echo Indications:    chest pain  History:        Patient has prior history of Echocardiogram examinations, most                 recent 06/12/2015. CAD; Risk Factors:Hypertension, Dyslipidemia,                 Diabetes and Sleep Apnea.  Sonographer:    Johny Chess Referring Phys: [id number] HAO MENG IMPRESSIONS  1. Left ventricular ejection fraction, by estimation, is 60 to 65%. The left ventricle has normal function. The left ventricle has no regional wall motion abnormalities. There is mild left ventricular hypertrophy. Left ventricular diastolic parameters are consistent with Grade I diastolic dysfunction (impaired relaxation).  2. Right ventricular systolic function is normal. The right ventricular size is normal.  3. The mitral valve is grossly normal. Trivial mitral valve regurgitation.  4. The aortic valve is tricuspid. Aortic valve regurgitation is not visualized.  5. The inferior vena cava is normal in size with <50% respiratory  variability, suggesting right atrial pressure of 8 mmHg. FINDINGS  Left Ventricle: Left ventricular ejection fraction, by estimation, is 60 to 65%. The left ventricle has normal function. The left ventricle has no regional wall motion abnormalities. The left ventricular internal cavity size was normal in size. There is  mild left ventricular hypertrophy. Left ventricular diastolic parameters are consistent with Grade I diastolic dysfunction (impaired relaxation). Indeterminate filling pressures. Right Ventricle: The right ventricular size is normal. No increase in right ventricular wall thickness. Right ventricular systolic function is normal. Left Atrium: Left atrial size was normal in size. Right Atrium: Right atrial size was normal in size. Pericardium: There is no evidence of pericardial effusion. Mitral Valve: The mitral valve is grossly normal. Trivial mitral valve regurgitation. Tricuspid Valve: The tricuspid valve is grossly normal. Tricuspid valve regurgitation is trivial. Aortic Valve: The aortic valve is tricuspid. Aortic valve regurgitation is not visualized. Pulmonic Valve: The pulmonic valve was normal in structure. Pulmonic valve regurgitation is not visualized. Aorta: The aortic root and ascending aorta are structurally normal, with no evidence of dilitation. Venous: The inferior vena cava is normal in size with less than 50% respiratory variability, suggesting right atrial pressure of 8 mmHg. IAS/Shunts: No atrial level shunt detected by color flow Doppler.  LEFT VENTRICLE PLAX 2D LVIDd:         4.40 cm  Diastology LVIDs:         2.90 cm  LV e' medial:    6.53 cm/s LV PW:         1.10 cm  LV E/e' medial:  10.6 LV IVS:        1.10 cm  LV e' lateral:   6.74 cm/s LVOT diam:     1.70 cm  LV E/e' lateral: 10.3 LV SV:         51 LV SV Index:   27 LVOT Area:     2.27 cm  RIGHT VENTRICLE             IVC RV S prime:     11.70 cm/s  IVC diam: 2.00 cm TAPSE (  M-mode): 2.0 cm LEFT ATRIUM             Index        RIGHT ATRIUM           Index LA diam:        4.20 cm 2.27 cm/m  RA Area:     11.30 cm LA Vol (A2C):   44.3 ml 23.95 ml/m RA Volume:   24.10 ml  13.03 ml/m LA Vol (A4C):   48.7 ml 26.33 ml/m LA Biplane Vol: 51.2 ml 27.68 ml/m  AORTIC VALVE LVOT Vmax:   96.40 cm/s LVOT Vmean:  64.300 cm/s LVOT VTI:    0.223 m  AORTA Ao Root diam: 3.10 cm Ao Asc diam:  3.70 cm MITRAL VALVE MV Area (PHT): 2.66 cm    SHUNTS MV Decel Time: 285 msec    Systemic VTI:  0.22 m MV E velocity: 69.40 cm/s  Systemic Diam: 1.70 cm MV A velocity: 94.30 cm/s MV E/A ratio:  0.74 Lyman Bishop MD Electronically signed by Lyman Bishop MD Signature Date/Time: 12/16/2020/4:32:24 PM    Final     Cardiac Studies   Echo: 12/16/20  IMPRESSIONS     1. Left ventricular ejection fraction, by estimation, is 60 to 65%. The  left ventricle has normal function. The left ventricle has no regional  wall motion abnormalities. There is mild left ventricular hypertrophy.  Left ventricular diastolic parameters  are consistent with Grade I diastolic dysfunction (impaired relaxation).   2. Right ventricular systolic function is normal. The right ventricular  size is normal.   3. The mitral valve is grossly normal. Trivial mitral valve  regurgitation.   4. The aortic valve is tricuspid. Aortic valve regurgitation is not  visualized.   5. The inferior vena cava is normal in size with <50% respiratory  variability, suggesting right atrial pressure of 8 mmHg.   Patient Profile     60 y.o. female with CAD, hypertension, hyperlipidemia, DM 2, hypothyroidism and former tobacco use who is being seen 12/16/2020 for the evaluation of chest pain.   Assessment & Plan    Chest pain: hsTn negative, symptoms concerning for unstable angina. Planned for cardiac cath today. No chest pain overnight.  Hypertension: stable with coreg 6.25mg  BID, and losartan 100mg  daily  Hyperlipidemia: LDL 61, Trig 241 -- on high dose statin  DM: Hgb A1c pending -- on  SSI  Hypothyroidism: TSH 37, reports she had ran out of her synthroid and recently restarted about a week prior to admission.  -- continue synthroid 200 mcg daily  Hx of tobacco use: has abstained   Asthma: no wheezing on exam  SVT: has had some short bursts on telemetry -- continue BB therapy  For questions or updates, please contact [place] Please consult [email or web address] for contact info under        Signed, Reino Bellis, NP  12/18/2020, 8:39 AM      Patient seen and examined. Agree with assessment and plan.  Presently, patient feels well.  I reviewed her last cath and interventional procedure done in [facility] in 2016 with Dr. Ellyn Hack.  Subsequently the patient has had additional stenting 2 stents being placed in [place] at [facility].  She has a remote tobacco history but fortunately quit about 2 years ago.  Over the past week she had noticed bilateral arm discomfort but later in the week developed chest discomfort anteriorly and posteriorly.  On exam, no JVD.  Lungs are clear without wheezes.  Rhythm is regular faint 1/6 stock murmur.  There is no chest wall tenderness.  Abdomen is soft.  There is no edema.  Troponins are negative.  Lipid studies consistent with mixed hyperlipidemia, consider adding Vascepa to her atorvastatin.  She is scheduled to undergo definitive repeat cardiac catheterization today with possible intervention if indicated.  I again discussed the risk benefits of the procedure in detail the patient who agrees to proceed.   Troy Sine, MD, [facility] 12/18/2020 10:35 AM

## 2020-12-18 NOTE — H&P (View-Only) (Signed)
Progress Note  Patient Name: Jacqueline Munoz Date of Encounter: 12/18/2020  [facility] Cardiologist: None   Subjective   No complaints this morning.   Inpatient Medications    Scheduled Meds:  aspirin  81 mg Oral Daily   atorvastatin  40 mg Oral QHS   carvedilol  6.25 mg Oral BID WC   insulin aspart  0-15 Units Subcutaneous TID WC   insulin detemir  50 Units Subcutaneous BID   losartan  100 mg Oral Daily   sodium chloride flush  3 mL Intravenous Q12H   Continuous Infusions:  sodium chloride     sodium chloride 1 mL/kg/hr (12/18/20 0719)   heparin 1,000 Units/hr (12/18/20 0615)   PRN Meds: sodium chloride, acetaminophen, ALPRAZolam, HYDROcodone-acetaminophen, nitroGLYCERIN, ondansetron (ZOFRAN) IV, sodium chloride flush   Vital Signs    Vitals:   12/17/20 0543 12/17/20 0825 12/17/20 2007 12/18/20 0430  BP: 104/60 137/86 131/77 111/61  Pulse: (!) 57 74 100 (!) 52  Resp: 18 17 16 17   Temp: 98 F (36.7 C) 97.9 F (36.6 C) 97.9 F (36.6 C) 98.2 F (36.8 C)  TempSrc: Oral Oral Oral Oral  SpO2:  100% 100% 100%  Weight:      Height:        Intake/Output Summary (Last 24 hours) at 12/18/2020 0839 Last data filed at 12/18/2020 0314 Gross per 24 hour  Intake 442.79 ml  Output --  Net 442.79 ml   Last 3 Weights 12/15/2020 11/13/2019 02/23/2016  Weight (lbs) 185 lb 200 lb 179 lb  Weight (kg) 83.915 kg 90.719 kg 81.194 kg      Telemetry   SR with SB at times - Personally Reviewed  ECG    No new tracing  Physical Exam   GEN: No acute distress.   Neck: No JVD Cardiac: RRR, no murmurs, rubs, or gallops.  Respiratory: Clear to auscultation bilaterally. GI: Soft, nontender, non-distended  MS: No edema; No deformity. Neuro:  Nonfocal  Psych: Normal affect   Labs    High Sensitivity Troponin:   Recent Labs  Lab 12/15/20 2112 12/15/20 2300  TROPONINIHS 5 5      Chemistry Recent Labs  Lab 12/15/20 2112 12/16/20 1328 12/17/20 0234  NA 136  --   137  K 3.9  --  3.5  CL 101  --  104  CO2 24  --  26  GLUCOSE 196*  --  168*  BUN 12  --  13  CREATININE 0.70 0.76 0.64  CALCIUM 9.2  --  8.7*  GFRNONAA >60 >60 >60  ANIONGAP 11  --  7     Hematology Recent Labs  Lab 12/15/20 2112 12/16/20 1328  WBC 10.2 9.5  RBC 4.57 4.91  HGB 13.3 14.1  HCT 41.5 44.6  MCV 90.8 90.8  MCH 29.1 28.7  MCHC 32.0 31.6  RDW 14.2 14.2  PLT 270 247    BNPNo results for input(s): BNP, PROBNP in the last 168 hours.   DDimer No results for input(s): DDIMER in the last 168 hours.   Radiology    ECHOCARDIOGRAM COMPLETE  Result Date: 12/16/2020    ECHOCARDIOGRAM REPORT   Patient Name:   Jacqueline Munoz Date of Exam: 12/16/2020 Medical Rec #:  [id number]     Height:       62.0 in Accession #:    [id number]    Weight:       185.0 lb Date of Birth:  Mar 18, 1961      BSA:  1.849 m Patient Age:    60 years      BP:           135/88 mmHg Patient Gender: F             HR:           61 bpm. Exam Location:  Inpatient Procedure: 2D Echo Indications:    chest pain  History:        Patient has prior history of Echocardiogram examinations, most                 recent 06/12/2015. CAD; Risk Factors:Hypertension, Dyslipidemia,                 Diabetes and Sleep Apnea.  Sonographer:    Johny Chess Referring Phys: [id number] HAO MENG IMPRESSIONS  1. Left ventricular ejection fraction, by estimation, is 60 to 65%. The left ventricle has normal function. The left ventricle has no regional wall motion abnormalities. There is mild left ventricular hypertrophy. Left ventricular diastolic parameters are consistent with Grade I diastolic dysfunction (impaired relaxation).  2. Right ventricular systolic function is normal. The right ventricular size is normal.  3. The mitral valve is grossly normal. Trivial mitral valve regurgitation.  4. The aortic valve is tricuspid. Aortic valve regurgitation is not visualized.  5. The inferior vena cava is normal in size with <50% respiratory  variability, suggesting right atrial pressure of 8 mmHg. FINDINGS  Left Ventricle: Left ventricular ejection fraction, by estimation, is 60 to 65%. The left ventricle has normal function. The left ventricle has no regional wall motion abnormalities. The left ventricular internal cavity size was normal in size. There is  mild left ventricular hypertrophy. Left ventricular diastolic parameters are consistent with Grade I diastolic dysfunction (impaired relaxation). Indeterminate filling pressures. Right Ventricle: The right ventricular size is normal. No increase in right ventricular wall thickness. Right ventricular systolic function is normal. Left Atrium: Left atrial size was normal in size. Right Atrium: Right atrial size was normal in size. Pericardium: There is no evidence of pericardial effusion. Mitral Valve: The mitral valve is grossly normal. Trivial mitral valve regurgitation. Tricuspid Valve: The tricuspid valve is grossly normal. Tricuspid valve regurgitation is trivial. Aortic Valve: The aortic valve is tricuspid. Aortic valve regurgitation is not visualized. Pulmonic Valve: The pulmonic valve was normal in structure. Pulmonic valve regurgitation is not visualized. Aorta: The aortic root and ascending aorta are structurally normal, with no evidence of dilitation. Venous: The inferior vena cava is normal in size with less than 50% respiratory variability, suggesting right atrial pressure of 8 mmHg. IAS/Shunts: No atrial level shunt detected by color flow Doppler.  LEFT VENTRICLE PLAX 2D LVIDd:         4.40 cm  Diastology LVIDs:         2.90 cm  LV e' medial:    6.53 cm/s LV PW:         1.10 cm  LV E/e' medial:  10.6 LV IVS:        1.10 cm  LV e' lateral:   6.74 cm/s LVOT diam:     1.70 cm  LV E/e' lateral: 10.3 LV SV:         51 LV SV Index:   27 LVOT Area:     2.27 cm  RIGHT VENTRICLE             IVC RV S prime:     11.70 cm/s  IVC diam: 2.00 cm TAPSE (  M-mode): 2.0 cm LEFT ATRIUM             Index        RIGHT ATRIUM           Index LA diam:        4.20 cm 2.27 cm/m  RA Area:     11.30 cm LA Vol (A2C):   44.3 ml 23.95 ml/m RA Volume:   24.10 ml  13.03 ml/m LA Vol (A4C):   48.7 ml 26.33 ml/m LA Biplane Vol: 51.2 ml 27.68 ml/m  AORTIC VALVE LVOT Vmax:   96.40 cm/s LVOT Vmean:  64.300 cm/s LVOT VTI:    0.223 m  AORTA Ao Root diam: 3.10 cm Ao Asc diam:  3.70 cm MITRAL VALVE MV Area (PHT): 2.66 cm    SHUNTS MV Decel Time: 285 msec    Systemic VTI:  0.22 m MV E velocity: 69.40 cm/s  Systemic Diam: 1.70 cm MV A velocity: 94.30 cm/s MV E/A ratio:  0.74 Lyman Bishop MD Electronically signed by Lyman Bishop MD Signature Date/Time: 12/16/2020/4:32:24 PM    Final     Cardiac Studies   Echo: 12/16/20  IMPRESSIONS     1. Left ventricular ejection fraction, by estimation, is 60 to 65%. The  left ventricle has normal function. The left ventricle has no regional  wall motion abnormalities. There is mild left ventricular hypertrophy.  Left ventricular diastolic parameters  are consistent with Grade I diastolic dysfunction (impaired relaxation).   2. Right ventricular systolic function is normal. The right ventricular  size is normal.   3. The mitral valve is grossly normal. Trivial mitral valve  regurgitation.   4. The aortic valve is tricuspid. Aortic valve regurgitation is not  visualized.   5. The inferior vena cava is normal in size with <50% respiratory  variability, suggesting right atrial pressure of 8 mmHg.   Patient Profile     60 y.o. female with CAD, hypertension, hyperlipidemia, DM 2, hypothyroidism and former tobacco use who is being seen 12/16/2020 for the evaluation of chest pain.   Assessment & Plan    Chest pain: hsTn negative, symptoms concerning for unstable angina. Planned for cardiac cath today. No chest pain overnight.  Hypertension: stable with coreg 6.25mg  BID, and losartan 100mg  daily  Hyperlipidemia: LDL 61, Trig 241 -- on high dose statin  DM: Hgb A1c pending -- on  SSI  Hypothyroidism: TSH 37, reports she had ran out of her synthroid and recently restarted about a week prior to admission.  -- continue synthroid 200 mcg daily  Hx of tobacco use: has abstained   Asthma: no wheezing on exam  SVT: has had some short bursts on telemetry -- continue BB therapy  For questions or updates, please contact [place] Please consult [email or web address] for contact info under        Signed, Reino Bellis, NP  12/18/2020, 8:39 AM      Patient seen and examined. Agree with assessment and plan.  Presently, patient feels well.  I reviewed her last cath and interventional procedure done in [facility] in 2016 with Dr. Ellyn Hack.  Subsequently the patient has had additional stenting 2 stents being placed in [place] at [facility].  She has a remote tobacco history but fortunately quit about 2 years ago.  Over the past week she had noticed bilateral arm discomfort but later in the week developed chest discomfort anteriorly and posteriorly.  On exam, no JVD.  Lungs are clear without wheezes.  Rhythm is regular faint 1/6 stock murmur.  There is no chest wall tenderness.  Abdomen is soft.  There is no edema.  Troponins are negative.  Lipid studies consistent with mixed hyperlipidemia, consider adding Vascepa to her atorvastatin.  She is scheduled to undergo definitive repeat cardiac catheterization today with possible intervention if indicated.  I again discussed the risk benefits of the procedure in detail the patient who agrees to proceed.   Troy Sine, MD, [facility] 12/18/2020 10:35 AM

## 2020-12-18 NOTE — Plan of Care (Signed)
  Problem: Clinical Measurements: Goal: Ability to maintain clinical measurements within normal limits will improve Outcome: Progressing   Problem: Coping: Goal: Level of anxiety will decrease Outcome: Progressing   Problem: Health Behavior/Discharge Planning: Goal: Ability to manage health-related needs will improve Outcome: Completed/Met   Problem: Nutrition: Goal: Adequate nutrition will be maintained Outcome: Completed/Met   Problem: Elimination: Goal: Will not experience complications related to urinary retention Outcome: Completed/Met

## 2020-12-19 ENCOUNTER — Encounter (HOSPITAL_COMMUNITY): Payer: Self-pay | Admitting: Cardiovascular Disease

## 2021-01-04 ENCOUNTER — Ambulatory Visit (HOSPITAL_BASED_OUTPATIENT_CLINIC_OR_DEPARTMENT_OTHER): Payer: Medicare Other | Admitting: Family

## 2021-01-28 ENCOUNTER — Encounter: Payer: Self-pay | Admitting: Cardiology

## 2021-01-28 NOTE — Progress Notes (Deleted)
Primary Care Provider: Pcp, No Cardiologist: Glenetta Hew, MD Electrophysiologist: None  Clinic Note: No chief complaint on file.   ===================================  ASSESSMENT/PLAN   Problem List Items Addressed This Visit       Cardiology Problems   History of Non-ST elevated myocardial infarction (non-STEMI) ([place]) - Primary (Chronic)   Coronary artery disease involving native coronary artery of native heart with angina pectoris ([place]) (Chronic)   Essential hypertension (Chronic)   CAD S/P percutaneous coronary angioplasty (Chronic)     Other   OSA (obstructive sleep apnea) (Chronic)    ===================================  HPI:    Jacqueline Munoz is a 60 y.o. female with a PMH notable for three-vessel CAD (PCI to LAD and bifurcation LCx-OM with CTO RCA) who presents today for posthospital follow-up after admission for chest pain with stable cath.  Prior Cardiac History  May 2016-Non-STEMI: Bifurcation LCx (m-d) & OM1 PCI (mini crush), and mid LAD-DES x2 (jailing diagonal).  RCA CTO June 2016-unstable angina: Denovo proximal LAD stent-third overlapping DES August 2017-admitted with chest pain-negative Myoview By report, had 2 more stents placed in [facility])  Jacqueline Munoz was last seen on August 24, 2015.  She was doing well from a cardiac standpoint.  Almost quit smoking, having exertional dyspnea from long-term smoking, but no exertional chest pain-cough improving.  Gradually try to increase exercise level.  Using Lasix PRN for weight gain noted feeling fatigued.  Lots of muscle skeletal pains.  BP tending to run low.  Recent Hospitalizations:  12/01/2020-ER visit for asthma (likely COPD) 6/17-20/2022: Admitted with chest pain concerning for unstable angina.  Stable findings.  No culprit lesion found.  Continued on aspirin, Plavix, statin beta-blocker and ARB.  Short bursts of SVT on telemetry noted.  Still not  smoking.  Reviewed  CV studies:    The following studies were reviewed today: (if available, images/films reviewed: From Epic Chart or Care Everywhere) Myoview 02/16/2016: EF 70%.  No ischemia or infarction.  LOW RISK TTE 12/16/2020: Normal LVEF 60 to 65%.  No RWM A.  GR 1 DD.  Mildly elevated CVP with normal RV function..  Otherwise normal. L Heart Cath-[facility] 12/18/2020: mid RCA 100% CTO. mid-distal LAD Stents Patent, Mid-Distal LCx & OM1 Stents Patent. Prox -mid LAD 20% & Prox-mid Cx 20%.    Interval History:   Jacqueline Munoz returns for post-hospital f/u.   CV Review of Symptoms (Summary) Cardiovascular ROS: {roscv:310661}  REVIEWED OF SYSTEMS   ROS Anxiety; musculoskeletal chest pain, heartburn.  Myalgias.  Chronic back pain  I have reviewed and (if needed) personally updated the patient's problem list, medications, allergies, past medical and surgical history, social and family history.   PAST MEDICAL HISTORY   Past Medical History:  Diagnosis Date   Arthritis    CAD S/P percutaneous coronary angioplasty    a)  NSTEMI 11/16/2014: s/p DES x2 LAD and DES to bifurcation of mid LCx-OM1 ;  b) UA/ACS 12/02/2014: 3rd Overlapping DES x1 to LAD for de novo 99% focal lesion in LAD; c) UA (12/18/20): Mid RCA CTO 100%. Patent LAD & LCx-Om stents   Chronic back pain    Chronic depression    Coronary artery chronic total occlusion: 100% RCA CTO 11/16/2014   a. Prox RCA to Mid RCA lesion, 100% stenosed.  -L-R Colleatrals.   Diabetes mellitus, type 2 ([place])    Essential hypertension    Fibromyalgia    History of stomach ulcers    History of tobacco abuse  HLD (hyperlipidemia)    Hypothyroid    Kidney stones    OSA (obstructive sleep apnea)    a. does not wear CPAP   Type IVa MI - Peri-PCI 11/17/2014   Prolonged ischemia during bifurcation PCI of  m-d Cx-OM1 with minicrush technique    PAST SURGICAL HISTORY   Past Surgical History:  Procedure Laterality Date   CARDIAC CATHETERIZATION  N/A 11/16/2014   Procedure: Left Heart Cath and Coronary Angiography;  Surgeon: Peter M Martinique, MD;  Location: [place] [facility];  Service: Cardiovascular;  RCA 100% CTO, m-dLAD 90%, m-dCx 95%, OM1 90%   CARDIAC CATHETERIZATION  11/16/2014   Procedure: Coronary Stent Intervention;  Surgeon: Peter M Martinique, MD;  Location: [place] [facility];  Service: Cardiovascular;;m-d LAD overlapping 3.0 x 16 & 2.75 x 16 Promus P DES; OM1-dCx Minicrush bifurcation (dCx 2.25 x 38 Synergy DES crushed with 2.75 x 24 Synergy DES in OM1)    CARDIAC CATHETERIZATION N/A 12/02/2014   Procedure: Left Heart Cath and Coronary Angiography;  Surgeon: Leonie Man, MD;  Location: [place] [facility];  Service: Cardiovascular;  Previous Stents in LAD & Cx-OM patent, mLAD prox to stents 99%   CARDIAC CATHETERIZATION N/A 12/02/2014   Procedure: Coronary Stent Intervention;  Surgeon: Leonie Man, MD;  Location: [place] [facility];  Service: Cardiovascular;  3rd overlapping (prox) mLAD Promus Premier DES 3.0 x 20 (3.25 mm)   CESAREAN SECTION  07/01/1998   INNER EAR SURGERY Bilateral "several"   LEFT HEART CATH AND CORONARY ANGIOGRAPHY N/A 12/18/2020   Procedure: LEFT HEART CATH AND CORONARY ANGIOGRAPHY;  Surgeon: Sherren Mocha, MD;  Location: [place] [facility];  Service: Cardiovascular::  mid RCA 100% CTO. mid-distal LAD Stents Patent, Mid-Distal LCx & OM1 Stents Patent. Prox -mid LAD 20% & Prox-mid Cx 20%.   NM MYOVIEW LTD  02/16/2016   EF 70%.  No ischemia or infarction.  LOW RISK   TONSILLECTOMY     TOTAL ABDOMINAL HYSTERECTOMY  ~ 2008   TRANSTHORACIC ECHOCARDIOGRAM  5/19 & 6/ 2016   a) EF 65-70%, mild LVH, Gr 1 DD; b) EF 60% -ron RWMA, no valve lesions   TRANSTHORACIC ECHOCARDIOGRAM  12/16/2020   Normal LVEF 60 to 65%.  No RWM A.  GR 1 DD.  Mildly elevated CVP with normal RV function..  Otherwise normal.   TUBAL LIGATION  07/01/1998    Immunization History  Administered Date(s) Administered   Influenza,inj,Quad  PF,6+ Mos 03/20/2015   Influenza,inj,quad, With Preservative 04/04/2016   Influenza-Unspecified 03/31/2015   Pneumococcal Polysaccharide-23 08/21/2015    MEDICATIONS/ALLERGIES   No outpatient medications have been marked as taking for the 01/29/21 encounter (Appointment) with Leonie Man, MD.    Allergies  Allergen Reactions   Corticosteroids Anaphylaxis    ALL steroids.      Lyrica [Pregabalin] Shortness Of Breath and Swelling   Penicillins Anaphylaxis    Has patient had a PCN reaction causing immediate rash, facial/tongue/throat swelling, SOB or lightheadedness with hypotension: {Yes Has patient had a PCN reaction causing severe rash involving mucus membranes or skin necrosis: NO Has patient had a PCN reaction that required hospitalization NO Has patient had a PCN reaction occurring within the last 10 years: {Yes If all of the above answers are "NO", then may proceed with Cephalosporin use.    Amitriptyline Other (See Comments)    Muscle Cramps   Duloxetine     Other reaction(s): neurological reaction   Gabapentin Swelling  Latex Rash    PAPER TAPE ONLY    SOCIAL HISTORY/FAMILY HISTORY   Reviewed in Epic:  Pertinent findings:  Social History   Tobacco Use   Smoking status: Former    Packs/day: 1.50    Years: 39.00    Pack years: 58.50    Types: Cigarettes    Quit date: 11/10/2014    Years since quitting: 6.2   Smokeless tobacco: Never   Tobacco comments:    started back but quit again 4 days ago, requests patch  Substance Use Topics   Alcohol use: No    Alcohol/week: 0.0 standard drinks    Comment: 11/15/2014 "might have a few drinks/yr"   Drug use: Yes    Types: Marijuana    Comment: last marijuana use 02/14/16   Social History   Social History Narrative   Patient reports living in a motel.  She is thinking of moving to a motel at [employer] during the off-season.    OBJCTIVE -PE, EKG, labs   Wt Readings from Last 3 Encounters:  12/15/20 185 lb  (83.9 kg)  11/13/19 200 lb (90.7 kg)  02/23/16 179 lb (81.2 kg)    Physical Exam: There were no vitals taken for this visit. Physical Exam Vitals reviewed.  Constitutional:      General: She is not in acute distress.    Appearance: Normal appearance. She is obese. She is not ill-appearing or toxic-appearing.  HENT:     Head: Normocephalic and atraumatic.  Neck:     Vascular: No carotid bruit or JVD.  Cardiovascular:     Rate and Rhythm: Normal rate and regular rhythm. No extrasystoles are present.    Chest Wall: PMI is not displaced.     Pulses: Normal pulses.     Heart sounds: Normal heart sounds. No murmur heard.   No friction rub. No gallop.  Pulmonary:     Effort: Pulmonary effort is normal. No respiratory distress.     Breath sounds: Normal breath sounds. No wheezing, rhonchi or rales.  Musculoskeletal:        General: Normal range of motion.     Cervical back: Normal range of motion and neck supple.  Skin:    General: Skin is warm and dry.  Neurological:     General: No focal deficit present.     Mental Status: She is alert and oriented to person, place, and time.  Psychiatric:        Mood and Affect: Mood normal.        Behavior: Behavior normal.        Thought Content: Thought content normal.        Judgment: Judgment normal.     Adult ECG Report  Rate: *** ;  Rhythm: {rhythm:17366};   Narrative Interpretation: ***  Recent Labs:  ***  Lab Results  Component Value Date   CHOL 149 12/17/2020   HDL 40 (L) 12/17/2020   LDLCALC 61 12/17/2020   LDLDIRECT 56 12/02/2014   TRIG 241 (H) 12/17/2020   CHOLHDL 3.7 12/17/2020   Lab Results  Component Value Date   CREATININE 0.60 12/18/2020   BUN 13 12/18/2020   NA 136 12/18/2020   K 4.0 12/18/2020   CL 104 12/18/2020   CO2 25 12/18/2020   CBC Latest Ref Rng & Units 12/18/2020 12/16/2020 12/15/2020  WBC 4.0 - 10.5 K/uL 8.0 9.5 10.2  Hemoglobin 12.0 - 15.0 g/dL 14.0 14.1 13.3  Hematocrit 36.0 - 46.0 % 42.8 44.6  41.5  Platelets 150 - 400 K/uL 206 247 270    Lab Results  Component Value Date   TSH 37.448 (H) 12/17/2020    ==================================================  COVID-19 Education: The signs and symptoms of COVID-19 were discussed with the patient and how to seek care for testing (follow up with PCP or arrange E-visit).    I spent a total of ***minutes with the patient spent in direct patient consultation.  Additional time spent with chart review  / charting (studies, outside notes, etc): *** min Total Time: *** min  Current medicines are reviewed at length with the patient today.  (+/- concerns) ***  This visit occurred during the SARS-CoV-2 public health emergency.  Safety protocols were in place, including screening questions prior to the visit, additional usage of staff PPE, and extensive cleaning of exam room while observing appropriate contact time as indicated for disinfecting solutions.  Notice: This dictation was prepared with Dragon dictation along with smaller phrase technology. Any transcriptional errors that result from this process are unintentional and may not be corrected upon review.  Patient Instructions / Medication Changes & Studies & Tests Ordered   There are no Patient Instructions on file for this visit.   Studies Ordered:   No orders of the defined types were placed in this encounter.    Glenetta Hew, M.D., M.S. Interventional Cardiologist   Pager # [phone] Phone # [phone] [address] [place], [place] [address]   Thank you for choosing Heartcare at [facility]!!

## 2021-01-29 ENCOUNTER — Ambulatory Visit: Payer: Medicare Other | Admitting: Cardiology

## 2021-01-29 ENCOUNTER — Other Ambulatory Visit: Payer: Self-pay

## 2021-01-29 DIAGNOSIS — I214 Non-ST elevation (NSTEMI) myocardial infarction: Secondary | ICD-10-CM

## 2021-01-29 DIAGNOSIS — I10 Essential (primary) hypertension: Secondary | ICD-10-CM

## 2021-01-29 DIAGNOSIS — I25119 Atherosclerotic heart disease of native coronary artery with unspecified angina pectoris: Secondary | ICD-10-CM

## 2021-01-29 DIAGNOSIS — I2510 Atherosclerotic heart disease of native coronary artery without angina pectoris: Secondary | ICD-10-CM

## 2021-01-29 DIAGNOSIS — G4733 Obstructive sleep apnea (adult) (pediatric): Secondary | ICD-10-CM

## 2021-02-08 ENCOUNTER — Ambulatory Visit: Payer: Medicare Other | Admitting: Cardiology

## 2021-04-15 NOTE — Progress Notes (Deleted)
Primary Care Provider: Pcp, No [facility] Cardiologist: Jacqueline Hew, MD Electrophysiologist: None  Clinic Note: No chief complaint on file.  ===================================  ASSESSMENT/PLAN   Problem List Items Addressed This Visit   None   ===================================  HPI:    Jacqueline Munoz is a 60 y.o. female former heavy smoker with PMH notable for CAD/ NSTEMI, Chronic HFpEF, HTN, HLD, DM-2 who is being seen today for Post-Hospital f/u after NSTEMI in June 2022.    CAD 11/16/2014: NSTEMI: Bifurcation (min-Crush) PCI to LCx-OM, Overlapping DES PCI prox-mid LAD, RCA CTO. 12/02/2014: Unstable Angina: patent LCx & mid-distal LAD stents.De NOVO mLAD 99% (prox to prior stents) --> DES PCI 3rd overlapping stent.  HFPEF - diuresed after NSTEMI 6L.  EF 55-60%. Gr 22  Jacqueline Munoz was last seen in clinic back in February 2017.  This was for ER follow-up.  Was doing well with no active symptoms.  Tolerating Lasix, maintaining stable weights.  Had lost weight due to dietary adjustments.  Still has some fatigue.  Chronic, cough.  Heartburn abdominal pain.  Myalgias.  Back pain.  Dizziness and nervousness.  Recent Hospitalizations:  12/15/2020: Substernal chest pain radiating down both arms.  Occurring at rest and with exertion.  Similar to previous angina.  Troponin negative. => Underwent cardiac catheterization.  Patent stents. Discharged on Coreg 6.25 twice daily, losartan 100 daily, 40 mg atorvastatin, Jardiance 25 mg daily, DAPT with aspirin and Plavix.  Reviewed  CV studies:    The following studies were reviewed today: (if available, images/films reviewed: From Epic Chart or Care Everywhere) TTE-12/16/2020: EF 60 to 65%.  Mild LVH.  GR 1 DD.  Normal valves.  Mildly elevated RAP with normal RV function and pressures.. Cardiac Cath 12/18/2020: prox RCA 100% CTO. Patent LAD & LCx-OM stents. Small D2 Jailed with Ostial stenosis - Med Rx.    Interval History:    Jacqueline Munoz   CV Review of Symptoms (Summary) Cardiovascular ROS: {roscv:310661}  REVIEWED OF SYSTEMS   ROS  I have reviewed and (if needed) personally updated the patient's problem list, medications, allergies, past medical and surgical history, social and family history.   PAST MEDICAL HISTORY   Past Medical History:  Diagnosis Date   Arthritis    CAD S/P percutaneous coronary angioplasty    a)  NSTEMI 11/16/2014: s/p DES x2 LAD and DES to bifurcation of mid LCx-OM1 ;  b) UA/ACS 12/02/2014: 3rd Overlapping DES x1 to LAD for de novo 99% focal lesion in LAD; c) UA (12/18/20): Mid RCA CTO 100%. Patent LAD & LCx-Om stents   Chronic back pain    Chronic depression    Coronary artery chronic total occlusion: 100% RCA CTO 11/16/2014   a. Prox RCA to Mid RCA lesion, 100% stenosed.  -L-R Colleatrals.   Diabetes mellitus, type 2 ([place])    Essential hypertension    Fibromyalgia    History of stomach ulcers    History of tobacco abuse    HLD (hyperlipidemia)    Hypothyroid    Kidney stones    OSA (obstructive sleep apnea)    a. does not wear CPAP   Type IVa MI - Peri-PCI 11/17/2014   Prolonged ischemia during bifurcation PCI of  m-d Cx-OM1 with minicrush technique    PAST SURGICAL HISTORY   Past Surgical History:  Procedure Laterality Date   CARDIAC CATHETERIZATION N/A 11/16/2014   Procedure: Left Heart Cath and Coronary Angiography;  Surgeon: Peter M Martinique, MD;  Location: [place] [facility];  Service: Cardiovascular;  RCA 100% CTO, m-dLAD 90%, m-dCx 95%, OM1 90%   CARDIAC CATHETERIZATION  11/16/2014   Procedure: Coronary Stent Intervention;  Surgeon: Peter M Martinique, MD;  Location: [place] [facility];  Service: Cardiovascular;;m-d LAD overlapping 3.0 x 16 & 2.75 x 16 Promus P DES; OM1-dCx Minicrush bifurcation (dCx 2.25 x 38 Synergy DES crushed with 2.75 x 24 Synergy DES in OM1)    CARDIAC CATHETERIZATION N/A 12/02/2014   Procedure: Left Heart Cath and Coronary Angiography;   Surgeon: Leonie Man, MD;  Location: [place] [facility];  Service: Cardiovascular;  Previous Stents in LAD & Cx-OM patent, mLAD prox to stents 99%   CARDIAC CATHETERIZATION N/A 12/02/2014   Procedure: Coronary Stent Intervention;  Surgeon: Leonie Man, MD;  Location: [place] [facility];  Service: Cardiovascular;  3rd overlapping (prox) mLAD Promus Premier DES 3.0 x 20 (3.25 mm)   CESAREAN SECTION  07/01/1998   INNER EAR SURGERY Bilateral "several"   LEFT HEART CATH AND CORONARY ANGIOGRAPHY N/A 12/18/2020   Procedure: LEFT HEART CATH AND CORONARY ANGIOGRAPHY;  Surgeon: Sherren Mocha, MD;  Location: [place] [facility];  Service: Cardiovascular::  mid RCA 100% CTO. mid-distal LAD Stents Patent, Mid-Distal LCx & OM1 Stents Patent. Prox -mid LAD 20% & Prox-mid Cx 20%.   NM MYOVIEW LTD  02/16/2016   EF 70%.  No ischemia or infarction.  LOW RISK   TONSILLECTOMY     TOTAL ABDOMINAL HYSTERECTOMY  ~ 2008   TRANSTHORACIC ECHOCARDIOGRAM  5/19 & 6/ 2016   a) EF 65-70%, mild LVH, Gr 1 DD; b) EF 60% -ron RWMA, no valve lesions   TRANSTHORACIC ECHOCARDIOGRAM  12/16/2020   Normal LVEF 60 to 65%.  No RWM A.  GR 1 DD.  Mildly elevated CVP with normal RV function..  Otherwise normal.   TUBAL LIGATION  07/01/1998    Immunization History  Administered Date(s) Administered   Influenza,inj,Quad PF,6+ Mos 03/20/2015   Influenza,inj,quad, With Preservative 04/04/2016   Influenza-Unspecified 03/31/2015   Pneumococcal Polysaccharide-23 08/21/2015    MEDICATIONS/ALLERGIES   No outpatient medications have been marked as taking for the 04/16/21 encounter (Appointment) with Leonie Man, MD.    Allergies  Allergen Reactions   Corticosteroids Anaphylaxis    ALL steroids.      Lyrica [Pregabalin] Shortness Of Breath and Swelling   Penicillins Anaphylaxis    Has patient had a PCN reaction causing immediate rash, facial/tongue/throat swelling, SOB or lightheadedness with hypotension: {Yes Has patient  had a PCN reaction causing severe rash involving mucus membranes or skin necrosis: NO Has patient had a PCN reaction that required hospitalization NO Has patient had a PCN reaction occurring within the last 10 years: {Yes If all of the above answers are "NO", then may proceed with Cephalosporin use.    Amitriptyline Other (See Comments)    Muscle Cramps   Duloxetine     Other reaction(s): neurological reaction   Gabapentin Swelling   Latex Rash    PAPER TAPE ONLY    SOCIAL HISTORY/FAMILY HISTORY   Reviewed in Epic:   Social History   Tobacco Use   Smoking status: Former    Packs/day: 1.50    Years: 39.00    Pack years: 58.50    Types: Cigarettes    Quit date: 11/10/2014    Years since quitting: 6.4   Smokeless tobacco: Never   Tobacco comments:    started back but quit again 4 days ago, requests patch  Substance Use Topics   Alcohol use: No    Alcohol/week: 0.0 standard drinks    Comment: 11/15/2014 "might have a few drinks/yr"   Drug use: Yes    Types: Marijuana    Comment: last marijuana use 02/14/16   Social History   Social History Narrative   Patient reports living in a motel.  She is thinking of moving to a motel at [employer] during the off-season.   Family History  Problem Relation Age of Onset   Hyperlipidemia Mother    Sudden death Father    Emphysema Maternal Grandmother        smoked    OBJCTIVE -PE, EKG, labs   Wt Readings from Last 3 Encounters:  12/15/20 185 lb (83.9 kg)  11/13/19 200 lb (90.7 kg)  02/23/16 179 lb (81.2 kg)    Physical Exam: There were no vitals taken for this visit. Physical Exam Constitutional:      General: She is not in acute distress.    Appearance: Normal appearance. She is obese. She is not ill-appearing or toxic-appearing.  HENT:     Head: Normocephalic and atraumatic.  Cardiovascular:     Rate and Rhythm: Normal rate and regular rhythm.     Pulses: Normal pulses.     Heart sounds: Normal heart sounds. No  murmur heard.   No friction rub. No gallop.  Pulmonary:     Effort: Pulmonary effort is normal. No respiratory distress.     Breath sounds: Normal breath sounds.  Chest:     Chest wall: No tenderness.  Musculoskeletal:        General: No swelling. Normal range of motion.     Cervical back: Normal range of motion and neck supple.  Skin:    General: Skin is warm and dry.  Neurological:     General: No focal deficit present.     Mental Status: She is alert and oriented to person, place, and time.  Psychiatric:        Mood and Affect: Mood normal.        Behavior: Behavior normal.        Thought Content: Thought content normal.        Judgment: Judgment normal.     Adult ECG Report  Rate: *** ;  Rhythm: {rhythm:17366};   Narrative Interpretation: ***  Recent Labs:  ***  Lab Results  Component Value Date   CHOL 149 12/17/2020   HDL 40 (L) 12/17/2020   LDLCALC 61 12/17/2020   LDLDIRECT 56 12/02/2014   TRIG 241 (H) 12/17/2020   CHOLHDL 3.7 12/17/2020   Lab Results  Component Value Date   CREATININE 0.60 12/18/2020   BUN 13 12/18/2020   NA 136 12/18/2020   K 4.0 12/18/2020   CL 104 12/18/2020   CO2 25 12/18/2020   CBC Latest Ref Rng & Units 12/18/2020 12/16/2020 12/15/2020  WBC 4.0 - 10.5 K/uL 8.0 9.5 10.2  Hemoglobin 12.0 - 15.0 g/dL 14.0 14.1 13.3  Hematocrit 36.0 - 46.0 % 42.8 44.6 41.5  Platelets 150 - 400 K/uL 206 247 270    Lab Results  Component Value Date   HGBA1C 9.5 (H) 12/17/2020   Lab Results  Component Value Date   TSH 37.448 (H) 12/17/2020    ==================================================  COVID-19 Education: The signs and symptoms of COVID-19 were discussed with the patient and how to seek care for testing (follow up with PCP or arrange E-visit).    I spent a total of ***  minutes with the patient spent in direct patient consultation.  Additional time spent with chart review  / charting (studies, outside notes, etc): *** min (15)  Total Time:  *** min  Current medicines are reviewed at length with the patient today.  (+/- concerns) ***  This visit occurred during the SARS-CoV-2 public health emergency.  Safety protocols were in place, including screening questions prior to the visit, additional usage of staff PPE, and extensive cleaning of exam room while observing appropriate contact time as indicated for disinfecting solutions.  Notice: This dictation was prepared with Dragon dictation along with smart phrase technology. Any transcriptional errors that result from this process are unintentional and may not be corrected upon review.   Studies Ordered:  No orders of the defined types were placed in this encounter.   Patient Instructions / Medication Changes & Studies & Tests Ordered   There are no Patient Instructions on file for this visit.   Jacqueline Munoz, M.D., M.S. Interventional Cardiologist   Pager # [phone] Phone # [phone] [address] [place],  [address]   Thank you for choosing Heartcare at [facility]!!

## 2021-04-16 ENCOUNTER — Ambulatory Visit: Payer: Medicare Other | Admitting: Cardiology

## 2021-09-20 NOTE — Telephone Encounter (Signed)
 I received a Referral and Office notes for the patient to be seen in our office.     I called the patient and Left a voicemail to please contact the office to schedule an appointment

## 2021-09-25 NOTE — Telephone Encounter (Signed)
 I received a referral to schedule an appointment for the patient to see Dr. Debby.     2nd attempt to schedule the patient. I left a voicemail for the patient to please contact the office to schedule

## 2021-10-11 ENCOUNTER — Emergency Department: Admit: 2021-10-11 | Payer: MEDICARE

## 2021-10-11 ENCOUNTER — Inpatient Hospital Stay
Admit: 2021-10-11 | Discharge: 2021-10-11 | Disposition: A | Discharge status: Home / Self Care | Payer: MEDICARE | Attending: Emergency Medicine

## 2021-10-11 DIAGNOSIS — K5792 Diverticulitis of intestine, part unspecified, without perforation or abscess without bleeding: Secondary | ICD-10-CM

## 2021-10-11 LAB — URINALYSIS W/ RFLX MICROSCOPIC
BACTERIA, URINE: NEGATIVE /hpf
Bacteria: NEGATIVE /HPF
Bilirubin, Urine: NEGATIVE
Bilirubin: NEGATIVE
Blood, Urine: NEGATIVE
Blood: NEGATIVE
Glucose, Ur: >300 mg/dL — AB
Glucose: >300 mg/dL — AB
Ketone: NEGATIVE mg/dL
Ketones, Urine: NEGATIVE mg/dL
Leukocyte Esterase, Urine: NEGATIVE
Leukocyte Esterase: NEGATIVE
Nitrite, Urine: NEGATIVE
Nitrites: NEGATIVE
Protein, UA: NEGATIVE mg/dL
Protein: NEGATIVE mg/dL
Specific Gravity, UA: >1.03 NA — ABNORMAL HIGH (ref 1.003–1.030)
Specific gravity: >1.03 — ABNORMAL HIGH (ref 1.003–1.030)
Urobilinogen, UA, POCT: 0.1 EU/dL (ref 0.1–1.0)
Urobilinogen: 0.1 EU/dL (ref 0.1–1.0)
pH (UA): 6 (ref 5.0–8.0)
pH, UA: 6 (ref 5.0–8.0)

## 2021-10-11 LAB — CBC WITH AUTOMATED DIFF
ABS. BASOPHILS: 0.1 10*3/uL (ref 0.0–0.1)
ABS. EOSINOPHILS: 0.3 10*3/uL (ref 0.0–0.4)
ABS. IMM. GRANS.: 0 10*3/uL (ref 0.00–0.04)
ABS. LYMPHOCYTES: 2 10*3/uL (ref 0.8–3.5)
ABS. MONOCYTES: 0.4 10*3/uL (ref 0.0–1.0)
ABS. NEUTROPHILS: 6.2 10*3/uL (ref 1.8–8.0)
ABSOLUTE NRBC: 0 10*3/uL (ref 0.00–0.01)
BASOPHILS: 1 % (ref 0–1)
EOSINOPHILS: 3 % (ref 0–7)
HCT: 40.6 % (ref 35.0–47.0)
HGB: 13.5 g/dL (ref 11.5–16.0)
IMMATURE GRANULOCYTES: 0 % (ref 0–0.5)
LYMPHOCYTES: 22 % (ref 12–49)
MCH: 29.2 PG (ref 26.0–34.0)
MCHC: 33.3 g/dL (ref 30.0–36.5)
MCV: 87.9 FL (ref 80.0–99.0)
MONOCYTES: 4 % — ABNORMAL LOW (ref 5–13)
MPV: 9.3 FL (ref 8.9–12.9)
NEUTROPHILS: 70 % (ref 32–75)
NRBC: 0 PER 100 WBC
PLATELET: 250 10*3/uL (ref 150–400)
RBC: 4.62 M/uL (ref 3.80–5.20)
RDW: 13.1 % (ref 11.5–14.5)
WBC: 9 10*3/uL (ref 3.6–11.0)

## 2021-10-11 LAB — METABOLIC PANEL, COMPREHENSIVE
A-G Ratio: 1.3 (ref 1.1–2.2)
ALT (SGPT): 12 U/L (ref 12–78)
AST (SGOT): <3 U/L — ABNORMAL LOW (ref 15–37)
Albumin: 3.8 g/dL (ref 3.5–5.0)
Alk. phosphatase: 95 U/L (ref 45–117)
Anion gap: 8 mmol/L (ref 5–15)
BUN/Creatinine ratio: 12 (ref 12–20)
BUN: 8 mg/dL (ref 6–20)
Bilirubin, total: 0.4 mg/dL (ref 0.2–1.0)
CO2: 23 mmol/L (ref 21–32)
Calcium: 9 mg/dL (ref 8.5–10.1)
Chloride: 109 mmol/L — ABNORMAL HIGH (ref 97–108)
Creatinine: 0.66 mg/dL (ref 0.55–1.02)
Globulin: 3 g/dL (ref 2.0–4.0)
Glucose: 288 mg/dL — ABNORMAL HIGH (ref 65–100)
Potassium: 3.6 mmol/L (ref 3.5–5.1)
Protein, total: 6.8 g/dL (ref 6.4–8.2)
Sodium: 140 mmol/L (ref 136–145)
eGFR: >60 mL/min/{1.73_m2} (ref >60)

## 2021-10-11 LAB — LIPASE
Lipase: 80 U/L (ref 73–393)
Lipase: 80 U/L (ref 73–393)

## 2021-10-11 LAB — CBC WITH AUTO DIFFERENTIAL
Basophils %: 1 % (ref 0–1)
Basophils Absolute: 0.1 10*3/uL (ref 0.0–0.1)
Eosinophils %: 3 % (ref 0–7)
Eosinophils Absolute: 0.3 10*3/uL (ref 0.0–0.4)
Granulocyte Absolute Count: 0 10*3/uL (ref 0.00–0.04)
Hematocrit: 40.6 % (ref 35.0–47.0)
Hemoglobin: 13.5 g/dL (ref 11.5–16.0)
Immature Granulocytes: 0 % (ref 0–0.5)
Lymphocytes %: 22 % (ref 12–49)
Lymphocytes Absolute: 2 10*3/uL (ref 0.8–3.5)
MCH: 29.2 PG (ref 26.0–34.0)
MCHC: 33.3 g/dL (ref 30.0–36.5)
MCV: 87.9 FL (ref 80.0–99.0)
MPV: 9.3 FL (ref 8.9–12.9)
Monocytes %: 4 % — ABNORMAL LOW (ref 5–13)
Monocytes Absolute: 0.4 10*3/uL (ref 0.0–1.0)
NRBC Absolute: 0 10*3/uL (ref 0.00–0.01)
Neutrophils %: 70 % (ref 32–75)
Neutrophils Absolute: 6.2 10*3/uL (ref 1.8–8.0)
Nucleated RBCs: 0 PER 100 WBC
Platelets: 250 10*3/uL (ref 150–400)
RBC: 4.62 M/uL (ref 3.80–5.20)
RDW: 13.1 % (ref 11.5–14.5)
WBC: 9 10*3/uL (ref 3.6–11.0)

## 2021-10-11 LAB — COMPREHENSIVE METABOLIC PANEL
ALT: 12 U/L (ref 12–78)
AST: <3 U/L — ABNORMAL LOW (ref 15–37)
Albumin/Globulin Ratio: 1.3 (ref 1.1–2.2)
Albumin: 3.8 g/dL (ref 3.5–5.0)
Alkaline Phosphatase: 95 U/L (ref 45–117)
Anion Gap: 8 mmol/L (ref 5–15)
BUN: 8 mg/dL (ref 6–20)
Bun/Cre Ratio: 12 (ref 12–20)
CO2: 23 mmol/L (ref 21–32)
Calcium: 9 mg/dL (ref 8.5–10.1)
Chloride: 109 mmol/L — ABNORMAL HIGH (ref 97–108)
Creatinine: 0.66 mg/dL (ref 0.55–1.02)
ESTIMATED GLOMERULAR FILTRATION RATE: >60 mL/min/{1.73_m2} (ref >60)
Globulin: 3 g/dL (ref 2.0–4.0)
Glucose: 288 mg/dL — ABNORMAL HIGH (ref 65–100)
Potassium: 3.6 mmol/L (ref 3.5–5.1)
Sodium: 140 mmol/L (ref 136–145)
Total Bilirubin: 0.4 mg/dL (ref 0.2–1.0)
Total Protein: 6.8 g/dL (ref 6.4–8.2)

## 2021-10-11 MED ORDER — ONDANSETRON 4 MG TAB, RAPID DISSOLVE
4 mg | ORAL_TABLET | Freq: Three times a day (TID) | ORAL | 0 refills | Status: AC | PRN
Start: 2021-10-11 — End: ?

## 2021-10-11 MED ORDER — ONDANSETRON (PF) 4 MG/2 ML INJECTION
4 mg/2 mL | INTRAMUSCULAR | Status: AC
Start: 2021-10-11 — End: 2021-10-11
  Administered 2021-10-11: 19:00:00 via INTRAVENOUS

## 2021-10-11 MED ORDER — CIPROFLOXACIN 500 MG TAB
500 mg | ORAL_TABLET | Freq: Two times a day (BID) | ORAL | 0 refills | Status: AC
Start: 2021-10-11 — End: 2021-10-18

## 2021-10-11 MED ORDER — SODIUM CHLORIDE 0.9% BOLUS IV
0.9 % | INTRAVENOUS | Status: AC
Start: 2021-10-11 — End: 2021-10-11
  Administered 2021-10-11: 19:00:00 via INTRAVENOUS

## 2021-10-11 MED ORDER — METRONIDAZOLE 500 MG TAB
500 mg | ORAL_TABLET | Freq: Three times a day (TID) | ORAL | 0 refills | Status: AC
Start: 2021-10-11 — End: 2021-10-18

## 2021-10-11 MED ORDER — KETOROLAC TROMETHAMINE 15 MG/ML INJECTION
15 mg/mL | INTRAMUSCULAR | Status: AC
Start: 2021-10-11 — End: 2021-10-11
  Administered 2021-10-11: 19:00:00 via INTRAVENOUS

## 2021-10-11 MED ORDER — FLUCONAZOLE 150 MG TAB
150 mg | ORAL_TABLET | ORAL | 0 refills | Status: AC
Start: 2021-10-11 — End: 2021-10-11

## 2021-10-11 MED ORDER — OXYCODONE-ACETAMINOPHEN 5 MG-325 MG TAB
5-325 mg | ORAL_TABLET | Freq: Four times a day (QID) | ORAL | 0 refills | Status: AC | PRN
Start: 2021-10-11 — End: 2021-10-14

## 2021-10-11 MED ORDER — MORPHINE 4 MG/ML INTRAVENOUS SOLUTION
4 mg/mL | INTRAVENOUS | Status: AC
Start: 2021-10-11 — End: 2021-10-11
  Administered 2021-10-11: 20:00:00 via INTRAVENOUS

## 2021-10-11 MED FILL — MORPHINE 4 MG/ML SYRINGE: 4 mg/mL | INTRAMUSCULAR | Qty: 1

## 2021-10-11 MED FILL — ONDANSETRON (PF) 4 MG/2 ML INJECTION: 4 mg/2 mL | INTRAMUSCULAR | Qty: 2

## 2021-10-11 MED FILL — KETOROLAC TROMETHAMINE 15 MG/ML INJECTION: 15 mg/mL | INTRAMUSCULAR | Qty: 1

## 2021-10-11 MED FILL — SODIUM CHLORIDE 0.9 % IV: INTRAVENOUS | Qty: 1000

## 2021-10-11 NOTE — ED Notes (Signed)
Patient arrives with complaints of left sided pain around thinks it may be a kidney stone she has a hx of kidney stones pain started yesterday with nausea and diarrhea

## 2021-10-11 NOTE — ED Notes (Signed)
Pt ambulatory to restroom

## 2021-10-11 NOTE — ED Provider Notes (Signed)
ED Provider Notes by Amada Jupiter, MD at 10/11/21 1407                Author: Amada Jupiter, MD  Service: Emergency Medicine  Author Type: Physician       Filed: 10/11/21 1629  Date of Service: 10/11/21 1407  Status: Signed          Editor: Amada Jupiter, MD (Physician)               [facility] EMERGENCY DEPT   EMERGENCY DEPARTMENT HISTORY AND PHYSICAL EXAM           Date: 10/11/2021   Patient Name: Katrina Bowen   MRN: [id number]   [place]: 08-08-1960   Date of evaluation: 10/11/2021   Provider: Amada Jupiter, MD    Note Started: 2:07 PM 10/11/21        HISTORY OF PRESENT ILLNESS          Chief Complaint       Patient presents with        ?  Flank Pain           History Provided By: Patient      HPI: Katrina Bowen is a 61 y.o. female presents to the emergency department with complaint of 1 day history of sharp left flank and left lower  quadrant abdominal pain.  Patient reports associated nausea vomiting diarrhea.  Patient denies fevers or chills.  Patient reports history of similar pain with at least 20 prior kidney stones.  Patient denies any urinary complaints.        PAST MEDICAL HISTORY     Past Medical History:   Kidney stones      Past Surgical History:   No past surgical history on file.      Family History:   No family history on file.      Social History:          Allergies:     Allergies        Allergen  Reactions         ?  Pcn [Penicillins]  Anaphylaxis     ?  Prednisone  Anaphylaxis     ?  Amitriptyline  Other (comments)             cramping         ?  Cymbalta [Duloxetine]  Other (comments)             SI         ?  Lyrica [Pregabalin]  Swelling           PCP: None      Current Meds:      Previous Medications           ALBUTEROL-IPRATROPIUM (DUO-NEB) 2.5 MG-0.5 MG/3 ML NEBU     ipratropium 0.5 mg-albuterol 3 mg (2.5 mg base)/3 mL nebulization soln    USE 3ML IN NEBULIZER EVERY 6 HOURS AS NEEDED FOR WHEEZING AND FOR SHORTNESS OF BREATH           ATORVASTATIN  (LIPITOR) 40 MG TABLET     atorvastatin 40 mg tablet    TAKE 1 TABLET BY MOUTH ONCE DAILY           CARVEDILOL (COREG) 3.125 MG TABLET     carvedilol 3.125 mg tablet    TAKE 1 TABLET BY MOUTH TWICE DAILY           CLOPIDOGREL (PLAVIX) 75 MG TAB  clopidogrel 75 mg tablet    TAKE 1 TABLET BY MOUTH ONCE DAILY           EMPAGLIFLOZIN (JARDIANCE) 25 MG TABLET     Jardiance 25 mg tablet    TAKE 1 TABLET BY MOUTH IN THE MORNING           ERGOCALCIFEROL (ERGOCALCIFEROL) 1,250 MCG (50,000 UNIT) CAPSULE     TAKE 1 CAPSULE BY MOUTH EVERY WEEK           FLUOXETINE (PROZAC) 40 MG CAPSULE     fluoxetine 40 mg capsule    TAKE 1 CAPSULE BY MOUTH ONCE DAILY           GABAPENTIN (NEURONTIN) 300 MG CAPSULE     gabapentin 300 mg capsule    TAKE 1 CAPSULE BY MOUTH 4 TIMES DAILY           GLIPIZIDE SR (GLUCOTROL XL) 10 MG CR TABLET     glipizide ER 10 mg tablet, extended release 24 hr    TAKE 1 TABLET BY MOUTH ONCE DAILY           INSULIN DETEMIR U-100 (LEVEMIR FLEXPEN) 100 UNIT/ML (3 ML) INPN     Levemir FlexTouch U-100 Insulin 100 unit/mL (3 mL) subcutaneous pen           ISOSORBIDE DINITRATE (ISORDIL) 30 MG TABLET     isosorbide dinitrate 30 mg tablet    TAKE 1 TABLET BY MOUTH EVERY 6 HOURS           LEVOTHYROXINE (EUTHYROX) 200 MCG TABLET     Euthyrox 200 mcg tablet    TAKE 1 TABLET BY MOUTH ONCE DAILY           LIDOCAINE (LIDODERM) 5 %     lidocaine 5 % topical patch    APPLY ONE PATCH TOPICALLY TO CLEAN, DRY SKIN. LEAVE ON FOR 12 HOURS THEN REMOVE. MUST WAIT AT LEAST 12 HOURS BEFORE APPLYING PATCH AGAIN.           LINACLOTIDE (LINZESS) 72 MCG CAP CAPSULE     Linzess 72 mcg capsule    TAKE 1 CAPSULE BY MOUTH ONCE DAILY           MELOXICAM (MOBIC) 15 MG TABLET     meloxicam 15 mg tablet    TAKE 1 TABLET BY MOUTH ONCE DAILY           TAMSULOSIN (FLOMAX) 0.4 MG CAPSULE     tamsulosin 0.4 mg capsule    TAKE 1 CAPSULE BY MOUTH ONCE DAILY           TRAMADOL (ULTRAM) 50 MG TABLET     tramadol 50 mg tablet    TAKE 1 TABLET BY MOUTH EVERY  4 HOURS AS NEEDED             PHYSICAL EXAM          ED Triage Vitals [10/11/21 1346]     ED Encounter Vitals Group           BP  (!) 155/87        Pulse (Heart Rate)  98        Resp Rate  18        Temp  98.9 F (37.2 C)        Temp src          O2 Sat (%)  98 %        Weight  178 lb  Height  5' 2"         Physical Exam   Physical Exam   Constitutional:        General: Uncomfortable but not toxic-appearing.   HENT:       Head: Normocephalic and atraumatic.      Nose: Nose normal.      Mouth/Throat:      Mouth: Mucous membranes are moist.   Eyes :       Extraocular Movements: Extraocular movements intact.      Pupils: Pupils are equal, round, and reactive to light.   Cardiovascular :       Rate and Rhythm: Normal rate.      Pulses: Normal pulses.   Pulmonary:       Effort: Pulmonary effort is normal.      Breath sounds: No stridor.   Abdominal:      General: Abdomen is flat. There is no  distension.    Musculoskeletal:          General: Normal range of motion.      Cervical back: Normal range of motion and neck supple.    Skin:      General: Skin is warm and dry.      Capillary Refill: Capillary refill takes less than 2 seconds.   Neurological:       General: No focal deficit present.      Mental Status: Aert and oriented to person, place, and time.   Psychiatric:          Mood and Affect: Mood normal.         Behavior: Behavior normal.            SCREENINGS                    LAB, EKG AND DIAGNOSTIC RESULTS     Labs:     Recent Results (from the past 12 hour(s))     CBC WITH AUTOMATED DIFF          Collection Time: 10/11/21  2:15 PM         Result  Value  Ref Range            WBC  9.0  3.6 - 11.0 K/uL       RBC  4.62  3.80 - 5.20 M/uL       HGB  13.5  11.5 - 16.0 g/dL       HCT  40.6  35.0 - 47.0 %       MCV  87.9  80.0 - 99.0 FL       MCH  29.2  26.0 - 34.0 PG       MCHC  33.3  30.0 - 36.5 g/dL       RDW  13.1  11.5 - 14.5 %       PLATELET  250  150 - 400 K/uL       MPV  9.3  8.9 - 12.9 FL       NRBC   0.0  0.0 PER 100 WBC       ABSOLUTE NRBC  0.00  0.00 - 0.01 K/uL       NEUTROPHILS  70  32 - 75 %       LYMPHOCYTES  22  12 - 49 %       MONOCYTES  4 (L)  5 - 13 %       EOSINOPHILS  3  0 - 7 %  BASOPHILS  1  0 - 1 %       IMMATURE GRANULOCYTES  0  0 - 0.5 %       ABS. NEUTROPHILS  6.2  1.8 - 8.0 K/UL       ABS. LYMPHOCYTES  2.0  0.8 - 3.5 K/UL       ABS. MONOCYTES  0.4  0.0 - 1.0 K/UL       ABS. EOSINOPHILS  0.3  0.0 - 0.4 K/UL       ABS. BASOPHILS  0.1  0.0 - 0.1 K/UL       ABS. IMM. GRANS.  0.0  0.00 - 0.04 K/UL       DF  AUTOMATED          METABOLIC PANEL, COMPREHENSIVE          Collection Time: 10/11/21  2:15 PM         Result  Value  Ref Range            Sodium  140  136 - 145 mmol/L       Potassium  3.6  3.5 - 5.1 mmol/L       Chloride  109 (H)  97 - 108 mmol/L       CO2  23  21 - 32 mmol/L       Anion gap  8  5 - 15 mmol/L       Glucose  288 (H)  65 - 100 mg/dL       BUN  8  6 - 20 mg/dL       Creatinine  0.66  0.55 - 1.02 mg/dL       BUN/Creatinine ratio  12  12 - 20         eGFR  >60  >60 ml/min/1.51m       Calcium  9.0  8.5 - 10.1 mg/dL       Bilirubin, total  0.4  0.2 - 1.0 mg/dL       AST (SGOT)  <3 (L)  15 - 37 U/L       ALT (SGPT)  12  12 - 78 U/L       Alk. phosphatase  95  45 - 117 U/L       Protein, total  6.8  6.4 - 8.2 g/dL       Albumin  3.8  3.5 - 5.0 g/dL       Globulin  3.0  2.0 - 4.0 g/dL       A-G Ratio  1.3  1.1 - 2.2         LIPASE          Collection Time: 10/11/21  2:15 PM         Result  Value  Ref Range            Lipase  80  73 - 393 U/L       URINALYSIS W/ RFLX MICROSCOPIC          Collection Time: 10/11/21  2:23 PM         Result  Value  Ref Range            Color  Yellow/Straw          Appearance  Clear  Clear         Specific gravity  >1.030 (H)  1.003 - 1.030       pH (UA)  6.0  5.0 - 8.0  Protein  Negative  Negative mg/dL       Glucose  >300 (A)  Negative mg/dL       Ketone  Negative  Negative mg/dL       Bilirubin  Negative  Negative         Blood  Negative   Negative         Urobilinogen  0.1  0.1 - 1.0 EU/dL       Nitrites  Negative  Negative         Leukocyte Esterase  Negative  Negative         WBC  0-4  0 - 4 /hpf       RBC  0-5  0 - 5 /hpf       Epithelial cells  Moderate (A)  Few /lpf       Bacteria  Negative  Negative /hpf            Mucus  Trace (A)  Negative /lpf           EKG: Initial EKG interpreted by me. Not Applicable      Radiologic Studies:   Non-plain film images such as CT, Ultrasound and MRI are read by the radiologist. Plain radiographic images are visualized and preliminarily interpreted by the ED Physician with the following findings:  Not Applicable      Interpretation per the Radiologist below, if available at the time of this note:   CT ABD PELV WO CONT      Result Date: 10/11/2021   EXAM:  CT ABD PELV WO CONT INDICATION: Left inguinal/flank pain, history of kidney stones COMPARISON: None. CONTRAST:  None. TECHNIQUE: Thin axial images were obtained through the abdomen and pelvis. Coronal and sagittal reconstructions were generated.  Oral contrast was not administered. CT dose reduction was achieved through use of a standardized protocol tailored for this examination and automatic exposure control for dose modulation. FINDINGS: Lower Thorax: Lung Bases: Clear. Heart: The heart is  normal in size. No pericardial effusion. Coronary artery stents noted. Abdomen/Pelvis: Evaluation of the solid organs is markedly limited without the use of IV contrast. Liver:  No focal liver lesions. Biliary system: Gallbladder is unremarkable. Spleen:  Normal. Pancreas: Normal. Kidneys/Ureters/Bladder: No renal masses. No renal or ureteral calculi. No hydronephrosis or hydroureter. The bladder is normal. Adrenals: Normal. Stomach/bowel: Colonic diverticulosis, with fat stranding surrounding a diverticulum  at the junction of the descending and sigmoid colon, and associated colonic wall thickening. There is no evidence of bowel obstruction. No evidence of organized  fluid collection. No free intraperitoneal air noted. Normal appendix. Reproductive Organs:  Status post hysterectomy. No suspicious adnexal masses. Vasculature: Normal caliber arteries. Severe calcific atherosclerosis of the infrarenal abdominal aorta and iliac vasculature. Nodes: No pathologically enlarged lymph nodes. Fluid: No free fluid.  Bones/Soft Tissue: No acute fractures or aggressive osseous lesions are seen.       1. Findings consistent with acute diverticulitis/colitis at the junction of the descending and sigmoid colon. No evidence of abscess or free intraperitoneal air. 2. Additional findings as above.           PROCEDURES     Unless otherwise noted below, none.   Procedures          CRITICAL CARE TIME     Patient does not meet Critical Care Time, 0 minutes        ED COURSE and DIFFERENTIAL DIAGNOSIS/MDM     CC/HPI/PE Summary, DDx: Patient with complaint consistent with  kidney stones.  However, given her left lower quadrant pain will  get CT to also evaluate for any diverticular or other colonic disease or infection.      Records Reviewed (source and summary of external notes): Prior medical records and Nursing notes      Vitals:       Vitals:          10/11/21 1346        BP:  (!) 155/87     Pulse:  98     Resp:  18     Temp:  98.9 F (37.2 C)     SpO2:  98%     Weight:  80.7 kg (178 lb)        Height:  5' 2" (1.575 m)            ED COURSE          Disposition Considerations (Tests not done, Shared Decision Making, Pt Expectation of Test or Treatment.):  Discussed with patient the findings  consistent with a simple acute diverticulitis.  Discussed treatment plan at home as outpatient.  Patient states she has a follow-up appointment already scheduled with GI next month regarding a colonoscopy.  Discussed the need for close follow-up as well  as return precautions.      Patient was given the following medications:     Medications       ketorolac (TORADOL) injection 15 mg (15 mg IntraVENous Given  10/11/21 1439)     ondansetron (ZOFRAN) injection 4 mg (4 mg IntraVENous Given 10/11/21 1437)     sodium chloride 0.9 % bolus infusion 1,000 mL (1,000 mL IntraVENous New Bag 10/11/21 1437)       morphine injection 4 mg (4 mg IntraVENous Given 10/11/21 1601)           CONSULTS: (Who and What was discussed)   None       Social Determinants affecting Dx or Tx: None      Smoking Cessation: Not Applicable        FINAL IMPRESSION           1.  Diverticulitis      2.  Acute left flank pain         3.  Abdominal pain, LLQ (left lower quadrant)                DISPOSITION/PLAN     Discharged      Discharge Note: The patient is stable for discharge home. The signs, symptoms, diagnosis, and discharge instructions have been discussed, understanding conveyed, and agreed upon. The patient is to follow up as recommended or return to ER should their  symptoms worsen.        PATIENT REFERRED TO:     Follow-up Information                  Follow up With  Specialties  Details  Why  Contact Info              Gwenlyn Fudge, MD  Gastroenterology  Schedule an appointment as soon as possible for a visit   Or follow-up with your gastroenterologist as discussed for colonoscopy once symptoms have improved.  [address]   [place] [address]   [phone]                          DISCHARGE MEDICATIONS:     Current  Discharge Medication List                 START taking these medications          Details        ciprofloxacin HCl (Cipro) 500 mg tablet  Take 1 Tablet by mouth two (2) times a day for 7 days.   Qty: 14 Tablet, Refills: 0   Start date: 10/11/2021, End date: 10/18/2021               metroNIDAZOLE (FlagyL) 500 mg tablet  Take 1 Tablet by mouth three (3) times daily for 7 days.   Qty: 21 Tablet, Refills: 0   Start date: 10/11/2021, End date: 10/18/2021               oxyCODONE-acetaminophen (Percocet) 5-325 mg per tablet  Take 1 Tablet by mouth every six (6) hours as needed for Pain for up to 3 days. Max Daily Amount: 4  Tablets.   Qty: 12 Tablet, Refills: 0   Start date: 10/11/2021, End date: 10/14/2021          Associated Diagnoses: Diverticulitis               ondansetron (ZOFRAN ODT) 4 mg disintegrating tablet  Take 1 Tablet by mouth every eight (8) hours as needed for Nausea or Vomiting.   Qty: 20 Tablet, Refills: 0   Start date: 10/11/2021                           DISCONTINUED MEDICATIONS:     Current Discharge Medication List                  I am the Primary Clinician of Record: Amada Jupiter, MD (electronically signed)      (Please note that parts of this dictation were completed with voice recognition software. Quite often unanticipated grammatical, syntax, homophones, and other interpretive errors are inadvertently transcribed  by the computer software. Please disregards these errors. Please excuse any errors that have escaped final proofreading.)

## 2021-10-11 NOTE — ED Notes (Signed)
Pt to CT via stretcher

## 2022-01-02 NOTE — Patient Instructions (Signed)
Attempted to reach patient at [phone] for PAT for procedure on 01/08/22. Not a working number. No other numbers provided.

## 2022-01-08 ENCOUNTER — Inpatient Hospital Stay: Payer: Medicare (Managed Care) | Attending: Gastroenterology

## 2022-01-08 LAB — POCT GLUCOSE: POC Glucose: 212 mg/dL — ABNORMAL HIGH (ref 65–100)

## 2022-01-08 MED ORDER — PROPOFOL 100 MG/10ML IV EMUL
100 MG/10ML | INTRAVENOUS | Status: DC | PRN
Start: 2022-01-08 — End: 2022-01-08
  Administered 2022-01-08 (×4): 50 via INTRAVENOUS

## 2022-01-08 MED ORDER — LACTATED RINGERS IV SOLN
INTRAVENOUS | Status: DC
Start: 2022-01-08 — End: 2022-01-08
  Administered 2022-01-08 (×2): via INTRAVENOUS

## 2022-01-08 MED FILL — LACTATED RINGERS IV SOLN: INTRAVENOUS | Qty: 1000

## 2022-01-08 NOTE — Progress Notes (Signed)
PT ASSISTED TO BR , IV DC'D SITE INTACT NO SWELLING NOTED , DISCHARGE INSTRUCTIONS GIVEN TO PT AND PT'S DAUGHTER ZOE , BOTH VERBALIZED UNDERSTANDING , NO DISTRESS NOTED

## 2022-01-08 NOTE — Progress Notes (Signed)
DR Carrolyn Meiers NTFD OF BLOOD PRESSURE 180/100 STATES OK TO DISCHARGE AND PT NEED TO TAKE BLOOD PRESSURE MEDICINE WHEN GET HOME

## 2022-01-08 NOTE — Anesthesia Post-Procedure Evaluation (Signed)
Department of Anesthesiology  Postprocedure Note    Patient: Katrina Bowen  MRN: [id number]  Birthdate: [birth year]  Date of evaluation: 01/08/2022      Procedure Summary     Date: 01/08/22 Room / Location: [facility] / [facility]    Anesthesia Start: 1233 Anesthesia Stop: 1307    Procedure: COLONOSCOPY (Lower GI Region) Diagnosis:       Rectum bleeding      (Rectum bleeding [K62.5])    Surgeons: Gwenlyn Fudge, MD Responsible Provider: Belenda Cruise, MD    Anesthesia Type: MAC ASA Status: 3          Anesthesia Type: MAC    Aldrete Phase I: Aldrete Score: 10    Aldrete Phase II:        Anesthesia Post Evaluation    Patient location during evaluation: bedside  Patient participation: complete - patient participated  Level of consciousness: awake  Pain score: 0  Airway patency: patent  Nausea & Vomiting: no nausea and no vomiting  Complications: no  Cardiovascular status: blood pressure returned to baseline  Respiratory status: acceptable  Hydration status: stable

## 2022-01-08 NOTE — Progress Notes (Signed)
Patient states okay to review and give discharge instructions to daughter, Clement Sayres

## 2022-01-08 NOTE — Anesthesia Pre-Procedure Evaluation (Signed)
Department of Anesthesiology  Preprocedure Note       Name:  Katrina Bowen   Age:  61 y.o.  DOB:  1961-01-25                                          MRN:  [id number]         Date:  01/08/2022      Surgeon: Juliann Mule):  Gwenlyn Fudge, MD    Procedure: Procedure(s):  COLONOSCOPY    Medications prior to admission:   Prior to Admission medications    Medication Sig Start Date End Date Taking? Authorizing Provider   aspirin 81 MG EC tablet Take 1 tablet by mouth daily 02/23/16  Yes Historical Provider, MD   carvedilol (COREG) 3.125 MG tablet TAKE 1 TABLET BY MOUTH 2 TIMES DAILY WITH A MEAL. 02/23/16  Yes Historical Provider, MD   clopidogrel (PLAVIX) 75 MG tablet Take 1 tablet by mouth daily 08/13/16  Yes Historical Provider, MD   diclofenac sodium (VOLTAREN) 1 % GEL Apply 2 g topically 4 times daily 11/13/19  Yes Historical Provider, MD   furosemide (LASIX) 40 MG tablet Take 1 tablet by mouth daily 02/23/16  Yes Historical Provider, MD   acetaminophen (TYLENOL) 500 MG tablet Take 2 tablets by mouth every 6 hours as needed for Pain   Yes Historical Provider, MD   atorvastatin (LIPITOR) 40 MG tablet Take 1 tablet by mouth nightly    Historical Provider, MD   empagliflozin (JARDIANCE) 25 MG tablet Take 1 tablet by mouth daily    Historical Provider, MD   sertraline (ZOLOFT) 100 MG tablet Take 1 tablet by mouth daily 12/02/21   Historical Provider, MD   losartan (COZAAR) 100 MG tablet Take 1 tablet by mouth daily    Historical Provider, MD   linaCLOtide (LINZESS) 72 MCG CAPS capsule Take 1 capsule by mouth every morning (before breakfast)    Historical Provider, MD   levothyroxine (SYNTHROID) 175 MCG tablet Take 1 tablet by mouth Daily 12/01/21   Historical Provider, MD   LANTUS SOLOSTAR 100 UNIT/ML injection pen Inject 50 Units into the skin 2 times daily 01/07/22   Historical Provider, MD   ibuprofen (ADVIL;MOTRIN) 200 MG tablet Take 2 tablets by mouth every 6 hours as needed    Historical Provider, MD       Current medications:     Current Facility-Administered Medications   Medication Dose Route Frequency Provider Last Rate Last Admin   . lactated ringers IV soln infusion   IntraVENous Continuous Gwenlyn Fudge, MD 100 mL/hr at 01/08/22 1109 New Bag at 01/08/22 1109       Allergies:    Allergies   Allergen Reactions   . Latex Rash     Use paper tape only   . Amitriptyline Anaphylaxis and Other (See Comments)     Muscle Cramps     . Corticosteroids Anaphylaxis     ALL steroids.        . Penicillins Anaphylaxis     Has patient had a PCN reaction causing immediate rash, facial/tongue/throat swelling, SOB or lightheadedness with hypotension: {Yes  Has patient had a PCN reaction causing severe rash involving mucus membranes or skin necrosis: NO  Has patient had a PCN reaction that required hospitalization NO  Has patient had a PCN reaction occurring within the last 10 years: {Yes  If all of the  above answers are "NO", then may proceed with Cephalosporin use.     . Pregabalin Anaphylaxis, Shortness Of Breath and Swelling   . Duloxetine Other (See Comments)     Other reaction(s): neurological reaction     . Gabapentin Swelling       Problem List:  There is no problem list on file for this patient.      Past Medical History:        Diagnosis Date   . Arthritis    . Asthma    . CHF (congestive heart failure) ([place])    . Chronic pain    . Diabetes mellitus ([place])    . Diverticulitis    . Fibromyalgia    . Heart attack ([place])     X2   . History of ear infections     pt stated multiple   . Hyperlipidemia    . Hypertension    . Hypothyroid    . Neuropathy        Past Surgical History:        Procedure Laterality Date   . CARDIAC CATHETERIZATION      x3 procedures; 7 stents   . CESAREAN SECTION     . COLONOSCOPY     . EAR SURGERY      pt stated several sx   . HYSTERECTOMY (CERVIX STATUS UNKNOWN)         Social History:    Social History     Tobacco Use   . Smoking status: Former     Types: Cigarettes   . Smokeless tobacco: Never   Substance Use Topics   .  Alcohol use: Yes     Comment: occassional                                Counseling given: Not Answered      Vital Signs (Current):   Vitals:    01/08/22 1043   BP: (!) 154/78   Pulse: 74   Resp: 18   Temp: 98.4 F (36.9 C)   TempSrc: Oral   SpO2: 98%   Weight: 81.6 kg (180 lb)                                              BP Readings from Last 3 Encounters:   01/08/22 (!) 154/78       NPO Status: Time of last liquid consumption: 2200                        Time of last solid consumption: 2200                        Date of last liquid consumption: 01/07/22                        Date of last solid food consumption: 01/07/22    BMI:   Wt Readings from Last 3 Encounters:   01/08/22 81.6 kg (180 lb)     Body mass index is 32.92 kg/m.    CBC:   Lab Results   Component Value Date/Time    WBC 9.0 10/11/2021 02:15 PM    RBC 4.62 10/11/2021 02:15 PM    HGB 13.5 10/11/2021 02:15  PM    HCT 40.6 10/11/2021 02:15 PM    MCV 87.9 10/11/2021 02:15 PM    RDW 13.1 10/11/2021 02:15 PM    PLT 250 10/11/2021 02:15 PM       CMP:   Lab Results   Component Value Date/Time    NA 140 10/11/2021 02:15 PM    K 3.6 10/11/2021 02:15 PM    CL 109 10/11/2021 02:15 PM    CO2 23 10/11/2021 02:15 PM    BUN 8 10/11/2021 02:15 PM    CREATININE 0.66 10/11/2021 02:15 PM    AGRATIO 1.3 10/11/2021 02:15 PM    GLUCOSE 288 10/11/2021 02:15 PM    PROT 6.8 10/11/2021 02:15 PM    CALCIUM 9.0 10/11/2021 02:15 PM    BILITOT 0.4 10/11/2021 02:15 PM    ALKPHOS 95 10/11/2021 02:15 PM    AST <3 10/11/2021 02:15 PM    ALT 12 10/11/2021 02:15 PM       POC Tests: No results for input(s): POCGLU, POCNA, POCK, POCCL, POCBUN, POCHEMO, POCHCT in the last 72 hours.    Coags: No results found for: PROTIME, INR, APTT    HCG (If Applicable): No results found for: PREGTESTUR, PREGSERUM, HCG, HCGQUANT     ABGs: No results found for: PHART, PO2ART, PCO2ART, HCO3ART, BEART, O2SATART     Type & Screen (If Applicable):  No results found for: LABABO, LABRH    Drug/Infectious Status  (If Applicable):  No results found for: HIV, HEPCAB    COVID-19 Screening (If Applicable): No results found for: COVID19        Anesthesia Evaluation  Patient summary reviewed and Nursing notes reviewed no history of anesthetic complications:   Airway: Mallampati: I  TM distance: >3 FB   Neck ROM: full  Mouth opening: > = 3 FB   Dental:    (+) edentulous      Pulmonary:normal exam  breath sounds clear to auscultation  (+) asthma:                            Cardiovascular:    (+) hypertension:, past MI:, CABG/stent:, CHF:,         Rhythm: regular  Rate: normal                    Neuro/Psych:   Negative Neuro/Psych ROS              GI/Hepatic/Renal: Neg GI/Hepatic/Renal ROS            Endo/Other:    (+) Diabetes, hypothyroidism::., .                 Abdominal:              PE comment: Deferred.   Vascular: negative vascular ROS.         Other Findings:           Anesthesia Plan      MAC and TIVA     ASA 3     (Standard ASA monitors: continuous EKG, BP, HR, pulse oximeter, and capnography.)  Induction: intravenous.    MIPS: Prophylactic antiemetics administered.  Anesthetic plan and risks discussed with patient (and family, if present.).      Plan discussed with CRNA.    Attending anesthesiologist reviewed and agrees with Preprocedure content                Belenda Cruise, MD   01/08/2022

## 2022-05-30 ENCOUNTER — Inpatient Hospital Stay
Admit: 2022-05-30 | Discharge: 2022-05-30 | Disposition: A | Discharge status: Home / Self Care | Payer: MEDICARE | Attending: Emergency Medicine

## 2022-05-30 DIAGNOSIS — H60392 Other infective otitis externa, left ear: Secondary | ICD-10-CM

## 2022-05-30 MED ORDER — FLUCONAZOLE 150 MG PO TABS
150 MG | ORAL_TABLET | Freq: Once | ORAL | 0 refills | Status: AC
Start: 2022-05-30 — End: 2022-05-30

## 2022-05-30 MED ORDER — NAPROXEN 500 MG PO TABS
500 | ORAL_TABLET | Freq: Two times a day (BID) | ORAL | 0 refills | 14.00000 days | Status: DC
Start: 2022-05-30 — End: 2024-03-20

## 2022-05-30 MED ORDER — CIPROFLOXACIN HCL 500 MG PO TABS
500 MG | ORAL_TABLET | Freq: Two times a day (BID) | ORAL | 0 refills | Status: AC
Start: 2022-05-30 — End: 2022-06-09

## 2022-05-30 NOTE — Discharge Instructions (Addendum)
Thank you!  Thank you for allowing me to care for you in the emergency department. It is my goal to provide you with excellent care. If you have not received excellent quality care, please ask to speak to the nurse manager. Please fill out the survey that will come to you by mail or email since we listen to your feedback!     Below you will find a list of your tests from today's visit.  Should you have any questions, please do not hesitate to call the emergency department.    Labs  No results found for this or any previous visit (from the past 12 hour(s)).    Radiologic Studies  No orders to display     ------------------------------------------------------------------------------------------------------------  The exam and treatment you received in the Emergency Department were for an urgent problem and are not intended as complete care. It is important that you follow-up with a doctor, nurse practitioner, or physician assistant to:  (1) confirm your diagnosis,  (2) re-evaluation of changes in your illness and treatment, and  (3) for ongoing care. Please take your discharge instructions with you when you go to your follow-up appointment.     If you have any problem arranging a follow-up appointment, contact the Emergency Department.  If your symptoms become worse or you do not improve as expected and you are unable to reach your health care provider, please return to the Emergency Department. We are available 24 hours a day.     If a prescription has been provided, please have it filled as soon as possible to prevent a delay in treatment. If you have any questions or reservations about taking the medication due to side effects or interactions with other medications, please call your primary care provider or contact the ER.

## 2022-05-30 NOTE — ED Notes (Signed)
Patient is alert and oriented at this time. Self ambulated to the waiting room with no assistance at this time. Discharge instructions reviewed, no questions or complaints at this time.       Allegra Grana, RN  05/30/22 [phone]

## 2022-05-30 NOTE — ED Triage Notes (Signed)
Pt presents stating " I believe I have a double ear infection"

## 2022-05-30 NOTE — ED Provider Notes (Signed)
[facility] EMERGENCY DEPT  EMERGENCY DEPARTMENT HISTORY AND PHYSICAL EXAM      Date: 05/30/2022  Patient Name: Katrina Bowen  MRN: [id number]  Birthdate 10/08/1960  Date of evaluation: 05/30/2022  Provider: Royanne FootsJoran Mischele Detter, MD   Note Started: 4:26 PM EST 05/30/22    HISTORY OF PRESENT ILLNESS     Chief Complaint   Patient presents with    Otalgia       History Provided By: Patient    HPI: Katrina Bowen is a 61 y.o. female with a history of recurrent ear infections, since she was 61 years old, open eustachian tubes, CHF, presenting for left ear problems.  Patient states that since yesterday has been having drainage out of her left ear as well as decreased hearing.  States that she always has some issues with hearing loss in bilateral ears but slightly worse since yesterday.  Also associated with pain and she is been taking ibuprofen.  Denies any fevers.  Recently moved to town and does not have ENT here.    PAST MEDICAL HISTORY   Past Medical History:  Past Medical History:   Diagnosis Date    Arthritis     Asthma     CHF (congestive heart failure) (HCC)     Chronic pain     Diabetes mellitus (HCC)     Diverticulitis     Fibromyalgia     Heart attack (HCC)     X2    History of ear infections     pt stated multiple    Hyperlipidemia     Hypertension     Hypothyroid     Neuropathy        Past Surgical History:  Past Surgical History:   Procedure Laterality Date    CARDIAC CATHETERIZATION      x3 procedures; 7 stents    CESAREAN SECTION      COLONOSCOPY      COLONOSCOPY N/A 01/08/2022    [facility]id [facility]id[facility]    EAR SURGERY      pt stated several sx    HYSTERECTOMY (CERVIX STATUS UNKNOWN)         Family History:  Family History   Problem Relation Age of Onset    No Known Problems Mother     No Known Problems Father        Social History:  Social History     Tobacco Use    Smoking status: Former     Types: Cigarettes    Smokeless tobacco: Never   Vaping Use    Vaping Use: Never used   Substance  Use Topics    Alcohol use: Yes     Comment: occassional    Drug use: Yes     Types: Marijuana (Weed)       Allergies:  Allergies   Allergen Reactions    Latex Rash     Use paper tape only    Amitriptyline Anaphylaxis and Other (See Comments)     Muscle Cramps      Corticosteroids Anaphylaxis     ALL steroids.         Penicillins Anaphylaxis     Has patient had a PCN reaction causing immediate rash, facial/tongue/throat swelling, SOB or lightheadedness with hypotension: {Yes  Has patient had a PCN reaction causing severe rash involving mucus membranes or skin necrosis: NO  Has patient had a PCN reaction that required hospitalization NO  Has patient had a PCN reaction occurring  within the last 10 years: {Yes  If all of the above answers are "NO", then may proceed with Cephalosporin use.      Pregabalin Anaphylaxis, Shortness Of Breath and Swelling    Duloxetine Other (See Comments)     Other reaction(s): neurological reaction      Gabapentin Swelling       PCP: Lawrence Santiago, MD    Current Meds:   No current facility-administered medications for this encounter.     Current Outpatient Medications   Medication Sig Dispense Refill    ciprofloxacin (CIPRO) 500 MG tablet Take 1 tablet by mouth 2 times daily for 10 days 20 tablet 0    naproxen (NAPROSYN) 500 MG tablet Take 1 tablet by mouth 2 times daily (with meals) 20 tablet 0    aspirin 81 MG EC tablet Take 1 tablet by mouth daily      atorvastatin (LIPITOR) 40 MG tablet Take 1 tablet by mouth nightly      carvedilol (COREG) 3.125 MG tablet TAKE 1 TABLET BY MOUTH 2 TIMES DAILY WITH A MEAL.      clopidogrel (PLAVIX) 75 MG tablet Take 1 tablet by mouth daily      diclofenac sodium (VOLTAREN) 1 % GEL Apply 2 g topically 4 times daily      empagliflozin (JARDIANCE) 25 MG tablet Take 1 tablet by mouth daily      sertraline (ZOLOFT) 100 MG tablet Take 1 tablet by mouth daily      losartan (COZAAR) 100 MG tablet Take 1 tablet by mouth daily      linaCLOtide (LINZESS) 72 MCG  CAPS capsule Take 1 capsule by mouth every morning (before breakfast)      levothyroxine (SYNTHROID) 175 MCG tablet Take 1 tablet by mouth Daily      LANTUS SOLOSTAR 100 UNIT/ML injection pen Inject 50 Units into the skin 2 times daily      furosemide (LASIX) 40 MG tablet Take 1 tablet by mouth daily      ibuprofen (ADVIL;MOTRIN) 200 MG tablet Take 2 tablets by mouth every 6 hours as needed      acetaminophen (TYLENOL) 500 MG tablet Take 2 tablets by mouth every 6 hours as needed for Pain         Social Determinants of Health:   Social Determinants of Health     Tobacco Use: Medium Risk (01/08/2022)    Patient History     Smoking Tobacco Use: Former     Smokeless Tobacco Use: Never     Passive Exposure: Not on file   Alcohol Use: Not At Risk (05/30/2022)    AUDIT-C     Frequency of Alcohol Consumption: Never     Average Number of Drinks: Patient does not drink     Frequency of Binge Drinking: Never   Physicist, medical Strain: Not on file   Food Insecurity: Not on file   Transportation Needs: Not on file   Physical Activity: Not on file   Stress: Not on file   Social Connections: Not on file   Intimate Partner Violence: Not on file   Depression: Not on file   Housing Stability: Not on file   Interpersonal Safety: Not on file   Utilities: Not on file       PHYSICAL EXAM   Physical Exam  Vitals and nursing note reviewed.   HENT:      Head: Normocephalic.      Ears:      Comments: Patient  appears to have bilateral otitis externa, her right eardrum full of fluid and there appears to be superior to fluid within the ear canal and on the left ear, yellowish drainage and abnormal appearing tympanic membrane     Nose: Nose normal.      Mouth/Throat:      Mouth: Mucous membranes are moist.   Eyes:      Extraocular Movements: Extraocular movements intact.   Cardiovascular:      Comments: Well perfused  Pulmonary:      Effort: Pulmonary effort is normal.   Musculoskeletal:         General: Normal range of motion.      Cervical  back: Normal range of motion.   Neurological:      General: No focal deficit present.      Mental Status: She is alert. Mental status is at baseline.   Psychiatric:         Mood and Affect: Mood normal.           SCREENINGS                   LAB, EKG AND DIAGNOSTIC RESULTS   Labs:  No results found for this or any previous visit (from the past 12 hour(s)).    EKG:.Not Applicable    Radiologic Studies:  Non-plain film images such as CT, Ultrasound and MRI are read by the radiologist. Plain radiographic images are visualized and preliminarily interpreted by the ED Provider with the following findings: Not Applicable.    Interpretation per the Radiologist below, if available at the time of this note:  No orders to display        [place] and DIFFERENTIAL DIAGNOSIS/MDM   CC/HPI Summary, DDx, ED Course, and Reassessment: Patient presenting with worsening left ear pain with some decreased hearing loss, concerning for otitis externa given the physical exam.  Will do antibiotics.    Clinical Management Tools:  Not Applicable    Records Reviewed (source and summary of external notes): Prior medical records and Nursing notes    Vitals:    Vitals:    05/30/22 1618 05/30/22 1619   BP: (!) 157/95    Pulse: 98    Resp: 18    Temp: 98.8 F (37.1 C)    TempSrc: Oral    SpO2: 93%    Weight:  77.1 kg (170 lb)   Height:  1.575 m (5\' 2" )        ED COURSE       Disposition Considerations (Tests not done, Shared Decision Making, Pt Expectation of Test or Treatment.): Not Applicable    Patient was given the following medications:  Medications - No data to display    CONSULTS: (Who and What was discussed)  None     Social Determinants affecting Dx or Tx: None    Smoking Cessation: Not Applicable    PROCEDURES   Unless otherwise noted above, none  Procedures      CRITICAL CARE TIME   Patient does not meet Critical Care Time, 0 minutes    ED FINAL IMPRESSION     1. Infective otitis externa of left ear           DISPOSITION/PLAN   DISPOSITION Decision To Discharge 05/30/2022 04:25:54 PM    Discharge Note: The patient is stable for discharge home. The signs, symptoms, diagnosis, and discharge instructions have been discussed, understanding conveyed, and agreed upon. The patient is to follow up as recommended  or return to ER should their symptoms worsen.      PATIENT REFERRED TO:  [place]  [address]  [place] [address]  [phone]    Schedule an appointment as soon as possible for a visit   As needed    Nicholes Stairs, MD  [address]  [address]  [place] [address]  [phone]    Schedule an appointment as soon as possible for a visit           DISCHARGE MEDICATIONS:     Medication List        START taking these medications      ciprofloxacin 500 MG tablet  Commonly known as: CIPRO  Take 1 tablet by mouth 2 times daily for 10 days     naproxen 500 MG tablet  Commonly known as: NAPROSYN  Take 1 tablet by mouth 2 times daily (with meals)            ASK your doctor about these medications      aspirin 81 MG EC tablet     atorvastatin 40 MG tablet  Commonly known as: LIPITOR     carvedilol 3.125 MG tablet  Commonly known as: COREG     clopidogrel 75 MG tablet  Commonly known as: PLAVIX     diclofenac sodium 1 % Gel  Commonly known as: VOLTAREN     empagliflozin 25 MG tablet  Commonly known as: JARDIANCE     furosemide 40 MG tablet  Commonly known as: LASIX     ibuprofen 200 MG tablet  Commonly known as: ADVIL;MOTRIN     Lantus SoloStar 100 UNIT/ML injection pen  Generic drug: insulin glargine     levothyroxine 175 MCG tablet  Commonly known as: SYNTHROID     linaCLOtide 72 MCG Caps capsule  Commonly known as: LINZESS     losartan 100 MG tablet  Commonly known as: COZAAR     sertraline 100 MG tablet  Commonly known as: ZOLOFT     TYLENOL 500 MG tablet  Generic drug: acetaminophen               Where to Get Your Medications        These medications were sent to [id number] [place] - [address] - P [phone] Carmon Ginsberg [phone]  [address], [place] [address]      Phone: [phone]   ciprofloxacin 500 MG tablet  naproxen 500 MG tablet           DISCONTINUED MEDICATIONS:  Current Discharge Medication List          I am the Primary Clinician of Record. Royanne Foots, MD (electronically signed)    (Please note that parts of this dictation were completed with voice recognition software. Quite often unanticipated grammatical, syntax, homophones, and other interpretive errors are inadvertently transcribed by the computer software. Please disregards these errors. Please excuse any errors that have escaped final proofreading.)     Royanne Foots, MD  05/30/22 [phone]

## 2023-01-03 ENCOUNTER — Inpatient Hospital Stay
Admit: 2023-01-03 | Discharge: 2023-01-03 | Disposition: A | Discharge status: Home / Self Care | Payer: MEDICARE | Attending: Emergency Medicine

## 2023-01-03 ENCOUNTER — Emergency Department: Admit: 2023-01-03 | Payer: MEDICARE

## 2023-01-03 DIAGNOSIS — J4521 Mild intermittent asthma with (acute) exacerbation: Secondary | ICD-10-CM

## 2023-01-03 LAB — CBC WITH AUTO DIFFERENTIAL
Basophils %: 1 % (ref 0–1)
Basophils Absolute: 0.1 10*3/uL (ref 0.0–0.1)
Eosinophils %: 1 % (ref 0–7)
Eosinophils Absolute: 0.1 10*3/uL (ref 0.0–0.4)
Hematocrit: 45 % (ref 35.0–47.0)
Hemoglobin: 15.6 g/dL (ref 11.5–16.0)
Immature Granulocytes %: 1 % — ABNORMAL HIGH (ref 0–0.5)
Immature Granulocytes Absolute: 0 10*3/uL (ref 0.00–0.04)
Lymphocytes %: 30 % (ref 12–49)
Lymphocytes Absolute: 2.4 10*3/uL (ref 0.8–3.5)
MCH: 30.4 PG (ref 26.0–34.0)
MCHC: 34.7 g/dL (ref 30.0–36.5)
MCV: 87.5 FL (ref 80.0–99.0)
MPV: 9.6 FL (ref 8.9–12.9)
Monocytes %: 5 % (ref 5–13)
Monocytes Absolute: 0.4 10*3/uL (ref 0.0–1.0)
Neutrophils %: 62 % (ref 32–75)
Neutrophils Absolute: 5 10*3/uL (ref 1.8–8.0)
Nucleated RBCs: 0 PER 100 WBC
Platelets: 283 10*3/uL (ref 150–400)
RBC: 5.14 M/uL (ref 3.80–5.20)
RDW: 13.2 % (ref 11.5–14.5)
WBC: 8 10*3/uL (ref 3.6–11.0)
nRBC: 0 10*3/uL (ref 0.00–0.01)

## 2023-01-03 LAB — COMPREHENSIVE METABOLIC PANEL
ALT: 26 U/L (ref 12–78)
AST: 14 U/L — ABNORMAL LOW (ref 15–37)
Albumin/Globulin Ratio: 1.2 (ref 1.1–2.2)
Albumin: 4 g/dL (ref 3.5–5.0)
Alk Phosphatase: 76 U/L (ref 45–117)
Anion Gap: 10 mmol/L (ref 5–15)
BUN/Creatinine Ratio: 8 — ABNORMAL LOW (ref 12–20)
BUN: 9 mg/dL (ref 6–20)
CO2: 26 mmol/L (ref 21–32)
Calcium: 9.6 mg/dL (ref 8.5–10.1)
Chloride: 93 mmol/L — ABNORMAL LOW (ref 97–108)
Creatinine: 1.06 mg/dL — ABNORMAL HIGH (ref 0.55–1.02)
Est, Glom Filt Rate: 59 mL/min/{1.73_m2} — ABNORMAL LOW (ref >60)
Globulin: 3.4 g/dL (ref 2.0–4.0)
Glucose: 688 mg/dL (ref 65–100)
Potassium: 4.4 mmol/L (ref 3.5–5.1)
Sodium: 129 mmol/L — ABNORMAL LOW (ref 136–145)
Total Bilirubin: 0.6 mg/dL (ref 0.2–1.0)
Total Protein: 7.4 g/dL (ref 6.4–8.2)

## 2023-01-03 LAB — EKG 12-LEAD
Atrial Rate: 92 {beats}/min
Diagnosis: NORMAL
P Axis: 36 degrees
P-R Interval: 148 ms
Q-T Interval: 414 ms
QRS Duration: 140 ms
QTc Calculation (Bazett): 511 ms
R Axis: 184 degrees
T Axis: 19 degrees
Ventricular Rate: 92 {beats}/min

## 2023-01-03 LAB — MAGNESIUM: Magnesium: 1.8 mg/dL (ref 1.6–2.4)

## 2023-01-03 LAB — POCT GLUCOSE
POC Glucose: 508 mg/dL — ABNORMAL HIGH (ref 65–100)
POC Glucose: 407 mg/dL — ABNORMAL HIGH (ref 65–100)
POC Glucose: 456 mg/dL — ABNORMAL HIGH (ref 65–100)

## 2023-01-03 LAB — TROPONIN
Troponin, High Sensitivity: 11 ng/L (ref 0–51)
Troponin, High Sensitivity: 13 ng/L (ref 0–51)

## 2023-01-03 MED ORDER — INSULIN LISPRO 100 UNIT/ML IJ SOLN
100 | Freq: Once | INTRAMUSCULAR | Status: AC
Start: 2023-01-03 — End: 2023-01-03
  Administered 2023-01-03: 11:00:00 12 [IU] via SUBCUTANEOUS

## 2023-01-03 MED ORDER — LOSARTAN POTASSIUM 100 MG PO TABS
100 | ORAL_TABLET | Freq: Every day | ORAL | 0 refills | Status: AC
Start: 2023-01-03 — End: ?

## 2023-01-03 MED ORDER — LEVOTHYROXINE SODIUM 175 MCG PO TABS
175 | ORAL_TABLET | Freq: Every day | ORAL | 0 refills | Status: AC
Start: 2023-01-03 — End: ?

## 2023-01-03 MED ORDER — CARVEDILOL 3.125 MG PO TABS
3.125 MG | ORAL_TABLET | ORAL | 0 refills | Status: AC
Start: 2023-01-03 — End: ?

## 2023-01-03 MED ORDER — ALBUTEROL SULFATE HFA 108 (90 BASE) MCG/ACT IN AERS
10890 (90 Base) MCG/ACT | RESPIRATORY_TRACT | 2 refills | Status: AC | PRN
Start: 2023-01-03 — End: ?

## 2023-01-03 MED ORDER — CLOPIDOGREL BISULFATE 75 MG PO TABS
75 | ORAL_TABLET | Freq: Every day | ORAL | 0 refills | Status: DC
Start: 2023-01-03 — End: 2024-05-22

## 2023-01-03 MED ORDER — INSULIN GLARGINE 100 UNIT/ML SC SOLN
100 | Freq: Once | SUBCUTANEOUS | Status: DC
Start: 2023-01-03 — End: 2023-01-03

## 2023-01-03 MED ORDER — ASPIRIN 81 MG PO TBEC
81 | ORAL_TABLET | Freq: Every day | ORAL | 0 refills | Status: AC
Start: 2023-01-03 — End: ?

## 2023-01-03 MED ORDER — IPRATROPIUM-ALBUTEROL 0.5-2.5 (3) MG/3ML IN SOLN
RESPIRATORY_TRACT | Status: AC
Start: 2023-01-03 — End: 2023-01-03
  Administered 2023-01-03: 07:00:00 1 via RESPIRATORY_TRACT

## 2023-01-03 MED ORDER — LANTUS SOLOSTAR 100 UNIT/ML SC SOPN
100 UNIT/ML | Freq: Every day | SUBCUTANEOUS | 0 refills | Status: AC
Start: 2023-01-03 — End: ?

## 2023-01-03 MED ORDER — LACTATED RINGERS IV BOLUS
INTRAVENOUS | Status: AC
Start: 2023-01-03 — End: 2023-01-03
  Administered 2023-01-03: 07:00:00 1000 mL via INTRAVENOUS

## 2023-01-03 MED ORDER — ATORVASTATIN CALCIUM 40 MG PO TABS
40 MG | ORAL_TABLET | Freq: Every evening | ORAL | 0 refills | Status: AC
Start: 2023-01-03 — End: ?

## 2023-01-03 MED ORDER — LANTUS SOLOSTAR 100 UNIT/ML SC SOPN
100 | Freq: Two times a day (BID) | SUBCUTANEOUS | 0 refills | Status: DC
Start: 2023-01-03 — End: 2023-01-03

## 2023-01-03 MED ORDER — INSULIN GLARGINE 100 UNIT/ML SC SOLN
100 | Freq: Once | SUBCUTANEOUS | Status: AC
Start: 2023-01-03 — End: 2023-01-03
  Administered 2023-01-03: 11:00:00 30 [IU] via SUBCUTANEOUS

## 2023-01-03 MED ORDER — INSULIN LISPRO 100 UNIT/ML IJ SOLN
100 | Freq: Once | INTRAMUSCULAR | Status: AC
Start: 2023-01-03 — End: 2023-01-03
  Administered 2023-01-03: 09:00:00 12 [IU] via SUBCUTANEOUS

## 2023-01-03 MED ORDER — INSULIN LISPRO 100 UNIT/ML IJ SOLN
100 | Freq: Once | INTRAMUSCULAR | Status: AC
Start: 2023-01-03 — End: 2023-01-03
  Administered 2023-01-03: 07:00:00 10 [IU] via SUBCUTANEOUS

## 2023-01-03 MED ORDER — FUROSEMIDE 40 MG PO TABS
40 MG | ORAL_TABLET | Freq: Every day | ORAL | 0 refills | Status: AC
Start: 2023-01-03 — End: ?

## 2023-01-03 MED FILL — INSULIN LISPRO 100 UNIT/ML IJ SOLN: 100 UNIT/ML | INTRAMUSCULAR | Qty: 12

## 2023-01-03 MED FILL — LANTUS 100 UNIT/ML SC SOLN: 100 UNIT/ML | SUBCUTANEOUS | Qty: 30

## 2023-01-03 MED FILL — LACTATED RINGERS IV SOLN: INTRAVENOUS | Qty: 1000

## 2023-01-03 MED FILL — INSULIN LISPRO 100 UNIT/ML IJ SOLN: 100 UNIT/ML | INTRAMUSCULAR | Qty: 10

## 2023-01-03 MED FILL — IPRATROPIUM-ALBUTEROL 0.5-2.5 (3) MG/3ML IN SOLN: RESPIRATORY_TRACT | Qty: 3

## 2023-01-03 NOTE — ED Provider Notes (Signed)
Sondra Barges [facility]  EMERGENCY DEPARTMENT ENCOUNTER NOTE    Date: 01/03/2023  Patient Name: Katrina Bowen    History of Presenting Illness     Chief Complaint   Patient presents with    Shortness of Breath    Asthma     History obtained from: Patient    HPI: Katrina Bowen, 62 y.o. female with past medical history as listed and reviewed below presents for shortness of breath.  She reports that she has been having worsening asthma over the past few days and had ran out of her albuterol.  She also has not been taking her insulin "for a while".  She reports not taking care of herself because she has had a rough time this month.  She comes in for refills of her nebulizers insulin.  Denies any chest pain, abdominal pain, nausea, vomiting.  No fevers or chills.  No runny nose or sore throat.  No SI or HI.  No other complaints.    Medical History   I reviewed the medical, surgical, family, and social history, as well as allergies:    PCP: None, None    Past Medical History:  Past Medical History:   Diagnosis Date    Arthritis     Asthma     CHF (congestive heart failure) (HCC)     Chronic pain     Diabetes mellitus (HCC)     Diverticulitis     Fibromyalgia     Heart attack (HCC)     X2    History of ear infections     pt stated multiple    Hyperlipidemia     Hypertension     Hypothyroid     Neuropathy      Past Surgical History:  Past Surgical History:   Procedure Laterality Date    CARDIAC CATHETERIZATION      x3 procedures; 7 stents    CESAREAN SECTION      COLONOSCOPY      COLONOSCOPY N/A 01/08/2022    COLONOSCOPY performed by Era Bumpers, MD at [facility] ENDOSCOPY    EAR SURGERY      pt stated several sx    HYSTERECTOMY (CERVIX STATUS UNKNOWN)       Current Outpatient Medications:  Current Outpatient Medications   Medication Instructions    acetaminophen (TYLENOL) 1,000 mg, Oral, EVERY 6 HOURS PRN    albuterol sulfate HFA (PROVENTIL HFA) 108 (90 Base) MCG/ACT inhaler 2 puffs, Inhalation, EVERY 4  HOURS PRN    aspirin 81 mg, Oral, DAILY    atorvastatin (LIPITOR) 40 mg, Oral, NIGHTLY    carvedilol (COREG) 3.125 MG tablet TAKE 1 TABLET BY MOUTH 2 TIMES DAILY WITH A MEAL.    clopidogrel (PLAVIX) 75 mg, Oral, DAILY    diclofenac sodium (VOLTAREN) 2 g, Topical, 4 TIMES DAILY    empagliflozin (JARDIANCE) 25 mg, Oral, DAILY    furosemide (LASIX) 40 mg, Oral, DAILY    ibuprofen (ADVIL;MOTRIN) 400 mg, Oral, EVERY 6 HOURS PRN    Lantus SoloStar 30 Units, SubCUTAneous, DAILY    levothyroxine (SYNTHROID) 175 mcg, Oral, DAILY    linaCLOtide (LINZESS) 72 mcg, Oral, DAILY BEFORE BREAKFAST    losartan (COZAAR) 100 mg, Oral, DAILY    naproxen (NAPROSYN) 500 mg, Oral, 2 TIMES DAILY WITH MEALS    sertraline (ZOLOFT) 100 mg, Oral, DAILY      Family History:  Family History   Problem Relation Age of Onset    No Known  Problems Mother     No Known Problems Father      Social History:  Social History     Tobacco Use    Smoking status: Former     Types: Cigarettes    Smokeless tobacco: Never   Vaping Use    Vaping Use: Never used   Substance Use Topics    Alcohol use: Yes     Comment: occassional    Drug use: Yes     Types: Marijuana Sheran Fava)     Allergies:  Allergies   Allergen Reactions    Latex Rash     Use paper tape only    Amitriptyline Anaphylaxis and Other (See Comments)     Muscle Cramps      Corticosteroids Anaphylaxis     ALL steroids.         Penicillins Anaphylaxis     Has patient had a PCN reaction causing immediate rash, facial/tongue/throat swelling, SOB or lightheadedness with hypotension: {Yes  Has patient had a PCN reaction causing severe rash involving mucus membranes or skin necrosis: NO  Has patient had a PCN reaction that required hospitalization NO  Has patient had a PCN reaction occurring within the last 10 years: {Yes  If all of the above answers are "NO", then may proceed with Cephalosporin use.      Pregabalin Anaphylaxis, Shortness Of Breath and Swelling    Duloxetine Other (See Comments)     Other  reaction(s): neurological reaction      Gabapentin Swelling       Review of Systems     Review of Systems  Negative: Positives and pertinent negatives as per HPI, otherwise negative ROS.    Physical Exam & Vital Signs   Vital Signs - I reviewed the patient's vital signs.    Vitals:    01/03/23 0515 01/03/23 0530 01/03/23 0545 01/03/23 0615   BP: 109/75 102/72 102/75 106/87   Pulse: 85 84 82 86   Resp:       Temp:       TempSrc:       SpO2: 97% 97% 97% 97%   Weight:         Physical Exam:    GENERAL: awake, alert, cooperative, not in distress  HEENT:  * Pupils equal, EOMI  * Head atraumatic  CV:  * audible heart sounds  * warm and perfused extremities bilaterally  PULMONARY: Good air movement, noted mild intermittent wheezes, no crackles  ABDOMEN/GU: soft, no distension, no guarding, no abdominal tenderness, murphy negative, McBurney negative, Rovsig negative, no masses, no CVA tenderness.  EXTREMITIES/BACK: warm and perfused, no tenderness, no edema, no signs of DVT (warmth, asymmetric swelling, erythema, or calf tenderness).  SKIN: no rashes or signs of trauma  NEURO:  * Speech clear  * Moves U&LE to command    Medical Decision Making     Patient is a 62 y.o. female presenting for SOB. Vitals reveal no significant abnormalities and physical exam reveals  wheezing . EKG showed  RBBB . Based on the history, physical exam, risk factors, and vital signs, differential includes: Asthma, COPD, CHF, ACS, pneumonia, hyperglycemia, DKA.    See ED Course and Reassessment for evaluation and discussion.        Records Reviewed: Nursing Notes and Old Medical Records  Social Determinants of health affecting management: None    ED Course & Reassessment     ED Course:     ED Course as of 01/03/23 [phone]  Fri Jan 03, 2023   0213 CBC does not show any evidence of acute process. Leukocytosis not present to suggest infection. Hemoglobin not suggestive of acute anemia.     No significant electrolyte derangements. Creatinine is not  elevated more than baseline range making AKI unlikely. No significant transaminitis noted. Normal bilirubin.    Glucose 688 without DKA. [SS]   0213 Trop 11 will trend. [SS]   0213 Magnesium within normal limits.   [SS]   0213 Chest x-ray negative for acute pathology: no CXR evidence of pulmonary edema, pleural effusion, pneumothorax, or pneumonia. This study was interpreted by me and confirmed on report.   [SS]   0358 Second troponin with negative delta change. ACS ruled out per the high-sensitivity troponin algorithm.   [SS]   0442 Glucose on downtrend [SS]      ED Course User Index  [SS] Tressie Ellis, MD       Reassessment:    Patient presents with apparent asthma exacerbation. The workup was not significant for any other malignant process. I doubt the patient has any other process requiring admission and further management including pneumonia, pneumothorax, pulmonary edema, or pleural effusion.    The patient had good symptomatic relief in the emergency department. Reexamination demonstrates improvement of wheezing and vitals are reassuring. The patient is a good candidate for outpatient therapy since the patient has a reassuring reexamination without respiratory distress prior to discharge. The patient will be discharged with treatment for asthma exacerbation.    Will refill all her medications including the insulin.    Understanding was insured that at this time there is no evidence for a more malignant underlying process, but that early in the process of an illness, an emergency department workup can be falsely reassuring.     Routine discharge counseling was given including the fact that any worsening, changing or persistent symptoms should prompt an immediate call or follow up with their primary physician or the emergency department. The importance of appropriate follow up was also discussed. More extensive discharge instructions were given in the patient's discharge paperwork.    After completion of  evaluation and discussion of results and diagnoses, all the questions were answered. If required, all follow up appointments and treatments were discussed and explained. Understanding was insured prior to discharge.    Diagnosis     Clinical Impression:   1. Mild intermittent asthma with exacerbation    2. Hyperglycemia        Final Disposition     Discharge: DISCHARGED FROM EMERGENCY DEPARTMENT    Patient will be discharged from the Emergency Department in stable condition. All of the diagnostic tests were reviewed and any questions were answered. Diagnosis, results, follow up if applicable, and return precautions were discussed. I have also put together printed discharge instructions for them that include: 1) educational information regarding their diagnosis, 2) how to care for their diagnosis at home, as well a 3) list of reasons why they would want to return to the ED prior to their follow-up appointment, should their condition change. Any labs or imaging done in the ED will be either printed with the discharge paperwork or available through MyChart.    DISCHARGE PLAN:  1.   Current Discharge Medication List        CONTINUE these medications which have NOT CHANGED    Details   naproxen (NAPROSYN) 500 MG tablet Take 1 tablet by mouth 2 times daily (with meals)  Qty: 20 tablet, Refills: 0  aspirin 81 MG EC tablet Take 1 tablet by mouth daily      atorvastatin (LIPITOR) 40 MG tablet Take 1 tablet by mouth nightly      carvedilol (COREG) 3.125 MG tablet TAKE 1 TABLET BY MOUTH 2 TIMES DAILY WITH A MEAL.      clopidogrel (PLAVIX) 75 MG tablet Take 1 tablet by mouth daily      diclofenac sodium (VOLTAREN) 1 % GEL Apply 2 g topically 4 times daily      empagliflozin (JARDIANCE) 25 MG tablet Take 1 tablet by mouth daily      sertraline (ZOLOFT) 100 MG tablet Take 1 tablet by mouth daily      losartan (COZAAR) 100 MG tablet Take 1 tablet by mouth daily      linaCLOtide (LINZESS) 72 MCG CAPS capsule Take 1 capsule by  mouth every morning (before breakfast)      levothyroxine (SYNTHROID) 175 MCG tablet Take 1 tablet by mouth Daily      LANTUS SOLOSTAR 100 UNIT/ML injection pen Inject 50 Units into the skin 2 times daily      furosemide (LASIX) 40 MG tablet Take 1 tablet by mouth daily      ibuprofen (ADVIL;MOTRIN) 200 MG tablet Take 2 tablets by mouth every 6 hours as needed      acetaminophen (TYLENOL) 500 MG tablet Take 2 tablets by mouth every 6 hours as needed for Pain            2. [facility] EMERGENCY DEPT  [address] [place] [address]  [phone]  Go to   If symptoms worsen, As needed    Your Doctor    Schedule an appointment as soon as possible for a visit in 2 days      Lawrence Santiago, MD  [address]  [place] [address]  [phone]    Schedule an appointment as soon as possible for a visit in 1 week      Rich Fuchs, MD  [address]  [place]  [place] [address]  [phone]    Schedule an appointment as soon as possible for a visit in 1 week      3.  Return to ED if worse    4.      Medication List        START taking these medications      albuterol sulfate HFA 108 (90 Base) MCG/ACT inhaler  Commonly known as: Proventil HFA  Inhale 2 puffs into the lungs every 4 hours as needed for Wheezing            CHANGE how you take these medications      Lantus SoloStar 100 UNIT/ML injection pen  Generic drug: insulin glargine  Inject 30 Units into the skin daily  What changed:   how much to take  when to take this            CONTINUE taking these medications      aspirin 81 MG EC tablet  Take 1 tablet by mouth daily     atorvastatin 40 MG tablet  Commonly known as: LIPITOR  Take 1 tablet by mouth nightly     carvedilol 3.125 MG tablet  Commonly known as: COREG  TAKE 1 TABLET BY MOUTH 2 TIMES DAILY WITH A MEAL.     clopidogrel 75 MG tablet  Commonly known as: PLAVIX  Take 1 tablet by mouth daily     furosemide 40  MG tablet  Commonly known as: LASIX  Take 1 tablet by mouth daily      levothyroxine 175 MCG tablet  Commonly known as: SYNTHROID  Take 1 tablet by mouth Daily     losartan 100 MG tablet  Commonly known as: COZAAR  Take 1 tablet by mouth daily            ASK your doctor about these medications      diclofenac sodium 1 % Gel  Commonly known as: VOLTAREN     empagliflozin 25 MG tablet  Commonly known as: JARDIANCE     ibuprofen 200 MG tablet  Commonly known as: ADVIL;MOTRIN     linaCLOtide 72 MCG Caps capsule  Commonly known as: LINZESS     naproxen 500 MG tablet  Commonly known as: NAPROSYN  Take 1 tablet by mouth 2 times daily (with meals)     sertraline 100 MG tablet  Commonly known as: ZOLOFT     TYLENOL 500 MG tablet  Generic drug: acetaminophen               Where to Get Your Medications        These medications were sent to [id number] [place] - [address] - P [phone] - F [phone]  [address], [place] [address]      Phone: [phone]   albuterol sulfate HFA 108 (90 Base) MCG/ACT inhaler  aspirin 81 MG EC tablet  atorvastatin 40 MG tablet  carvedilol 3.125 MG tablet  clopidogrel 75 MG tablet  furosemide 40 MG tablet  Lantus SoloStar 100 UNIT/ML injection pen  levothyroxine 175 MCG tablet  losartan 100 MG tablet       5.   Current Discharge Medication List          Procedures, Critical Care, & Clinical Tools   Performed by: Tressie Ellis, MD  Procedures     EKG interpretation (Preliminary interpretation by me):  Rhythm: normal sinus rhythm; and regular . Rate (approx.): 92.  Axis: Rightward axis;  PR interval: normal;  QRS: RBBB;  ST/T wave: normal;    CRITICAL CARE DOCUMENTATION  NOT MET: Critical care billing criteria and/or time were NOT met.      HEART SCORE  Heart score not applicable: no chest pain .     Results, Consults, Medications     Consults:  None   Labs:  Recent Results (from the past 12 hour(s))   CBC with Auto Differential    Collection Time: 01/03/23 12:50 AM   Result Value Ref Range    WBC 8.0 3.6 - 11.0 K/uL     RBC 5.14 3.80 - 5.20 M/uL    Hemoglobin 15.6 11.5 - 16.0 g/dL    Hematocrit 43.3 29.5 - 47.0 %    MCV 87.5 80.0 - 99.0 FL    MCH 30.4 26.0 - 34.0 PG    MCHC 34.7 30.0 - 36.5 g/dL    RDW 18.8 41.6 - 60.6 %    Platelets 283 150 - 400 K/uL    MPV 9.6 8.9 - 12.9 FL    Nucleated RBCs 0.0 0.0 PER 100 WBC    nRBC 0.00 0.00 - 0.01 K/uL    Neutrophils % 62 32 - 75 %    Lymphocytes % 30 12 - 49 %    Monocytes % 5 5 - 13 %    Eosinophils % 1 0 - 7 %    Basophils %  1 0 - 1 %    Immature Granulocytes % 1 (H) 0 - 0.5 %    Neutrophils Absolute 5.0 1.8 - 8.0 K/UL    Lymphocytes Absolute 2.4 0.8 - 3.5 K/UL    Monocytes Absolute 0.4 0.0 - 1.0 K/UL    Eosinophils Absolute 0.1 0.0 - 0.4 K/UL    Basophils Absolute 0.1 0.0 - 0.1 K/UL    Immature Granulocytes Absolute 0.0 0.00 - 0.04 K/UL    Differential Type AUTOMATED     Comprehensive Metabolic Panel    Collection Time: 01/03/23 12:50 AM   Result Value Ref Range    Sodium 129 (L) 136 - 145 mmol/L    Potassium 4.4 3.5 - 5.1 mmol/L    Chloride 93 (L) 97 - 108 mmol/L    CO2 26 21 - 32 mmol/L    Anion Gap 10 5 - 15 mmol/L    Glucose 688 (HH) 65 - 100 mg/dL    BUN 9 6 - 20 mg/dL    Creatinine 7.56 (H) 0.55 - 1.02 mg/dL    BUN/Creatinine Ratio 8 (L) 12 - 20      Est, Glom Filt Rate 59 (L) >60 ml/min/1.69m2    Calcium 9.6 8.5 - 10.1 mg/dL    Total Bilirubin 0.6 0.2 - 1.0 mg/dL    AST 14 (L) 15 - 37 U/L    ALT 26 12 - 78 U/L    Alk Phosphatase 76 45 - 117 U/L    Total Protein 7.4 6.4 - 8.2 g/dL    Albumin 4.0 3.5 - 5.0 g/dL    Globulin 3.4 2.0 - 4.0 g/dL    Albumin/Globulin Ratio 1.2 1.1 - 2.2     Magnesium    Collection Time: 01/03/23 12:50 AM   Result Value Ref Range    Magnesium 1.8 1.6 - 2.4 mg/dL   Troponin "IF" patient is greater than 70 years of age or has a history of cardiac disease.    Collection Time: 01/03/23 12:50 AM   Result Value Ref Range    Troponin, High Sensitivity 11 0 - 51 ng/L   Troponin    Collection Time: 01/03/23  3:12 AM   Result Value Ref Range    Troponin, High  Sensitivity 13 0 - 51 ng/L   POCT Glucose    Collection Time: 01/03/23  4:18 AM   Result Value Ref Range    POC Glucose 508 (H) 65 - 100 mg/dL    Performed by: Andris Baumann    POCT Glucose    Collection Time: 01/03/23  5:33 AM   Result Value Ref Range    POC Glucose 456 (H) 65 - 100 mg/dL    Performed by: Andris Baumann    POCT Glucose    Collection Time: 01/03/23  5:59 AM   Result Value Ref Range    POC Glucose 407 (H) 65 - 100 mg/dL    Performed by: Andris Baumann      Radiologic Studies:  XR CHEST 1 VIEW   Final Result   No acute cardiopulmonary disease.             Electronically signed by Rowan Blase, MD        Medications ordered:  Medications   ipratropium 0.5 mg-albuterol 2.5 mg (DUONEB) nebulizer solution 1 Dose (1 Dose Inhalation Not Given 01/03/23 0603)   insulin lispro (HUMALOG,ADMELOG) injection vial 12 Units (has no administration in time range)   insulin glargine (LANTUS) injection vial 30 Units (has  no administration in time range)   lactated ringers bolus 1,000 mL (0 mLs IntraVENous Stopped 01/03/23 0443)   insulin lispro (HUMALOG,ADMELOG) injection vial 10 Units (10 Units SubCUTAneous Given 01/03/23 0257)   insulin lispro (HUMALOG,ADMELOG) injection vial 12 Units (12 Units SubCUTAneous Given 01/03/23 0455)       Documentation Comments   - I am the first and primary provider for this patient and am the primary provider of record.  - Initial assessment performed. The patients presenting problems have been discussed, and the staff are in agreement with the care plan formulated and outlined with them.  I have encouraged them to ask questions as they arise throughout their visit.  - Available medical records, nursing notes, old EKGs, and EMS run sheets (if patient was EMS transported) were reviewed    Please note that this dictation was completed with Dragon, the computer voice recognition software.  Quite often unanticipated grammatical, syntax, homophones, and other interpretive errors are inadvertently  transcribed by the computer software.  Please disregard these errors.  Please excuse any errors that have escaped final proofreading.     Tressie Ellis, MD  01/03/23 [phone]

## 2023-01-03 NOTE — ED Triage Notes (Signed)
Pt reports increased sob over the past several  days hx of asthma has been out of nebulizer treatment states she has used rescue inhaler with no relief

## 2023-01-22 ENCOUNTER — Emergency Department: Admit: 2023-01-22 | Payer: MEDICARE

## 2023-01-22 ENCOUNTER — Inpatient Hospital Stay
Admit: 2023-01-22 | Discharge: 2023-01-23 | Disposition: A | Discharge status: Home / Self Care | Payer: MEDICARE | Attending: Sports Medicine

## 2023-01-22 DIAGNOSIS — I110 Hypertensive heart disease with heart failure: Principal | ICD-10-CM

## 2023-01-22 DIAGNOSIS — R600 Localized edema: Secondary | ICD-10-CM

## 2023-01-22 LAB — CBC WITH AUTO DIFFERENTIAL
Basophils %: 1 % (ref 0–1)
Basophils Absolute: 0.1 10*3/uL (ref 0.0–0.1)
Eosinophils %: 1 % (ref 0–7)
Eosinophils Absolute: 0.1 10*3/uL (ref 0.0–0.4)
Hematocrit: 41.2 % (ref 35.0–47.0)
Hemoglobin: 14 g/dL (ref 11.5–16.0)
Immature Granulocytes %: 1 % — ABNORMAL HIGH (ref 0–0.5)
Immature Granulocytes Absolute: 0.1 10*3/uL — ABNORMAL HIGH (ref 0.00–0.04)
Lymphocytes %: 28 % (ref 12–49)
Lymphocytes Absolute: 1.8 10*3/uL (ref 0.8–3.5)
MCH: 30.4 PG (ref 26.0–34.0)
MCHC: 34 g/dL (ref 30.0–36.5)
MCV: 89.6 FL (ref 80.0–99.0)
MPV: 9.5 FL (ref 8.9–12.9)
Monocytes %: 4 % — ABNORMAL LOW (ref 5–13)
Monocytes Absolute: 0.2 10*3/uL (ref 0.0–1.0)
Neutrophils %: 65 % (ref 32–75)
Neutrophils Absolute: 4.3 10*3/uL (ref 1.8–8.0)
Nucleated RBCs: 0 PER 100 WBC
Platelets: 201 10*3/uL (ref 150–400)
RBC: 4.6 M/uL (ref 3.80–5.20)
RDW: 13.5 % (ref 11.5–14.5)
WBC: 6.6 10*3/uL (ref 3.6–11.0)
nRBC: 0 10*3/uL (ref 0.00–0.01)

## 2023-01-22 LAB — BRAIN NATRIURETIC PEPTIDE: NT Pro-BNP: 147 pg/mL — ABNORMAL HIGH (ref <125)

## 2023-01-22 LAB — COMPREHENSIVE METABOLIC PANEL
ALT: 40 U/L (ref 12–78)
AST: 51 U/L — ABNORMAL HIGH (ref 15–37)
Albumin/Globulin Ratio: 1.1 (ref 1.1–2.2)
Albumin: 3.4 g/dL — ABNORMAL LOW (ref 3.5–5.0)
Alk Phosphatase: 84 U/L (ref 45–117)
Anion Gap: 8 mmol/L (ref 5–15)
BUN/Creatinine Ratio: 10 — ABNORMAL LOW (ref 12–20)
BUN: 11 mg/dL (ref 6–20)
CO2: 29 mmol/L (ref 21–32)
Calcium: 8.6 mg/dL (ref 8.5–10.1)
Chloride: 99 mmol/L (ref 97–108)
Creatinine: 1.11 mg/dL — ABNORMAL HIGH (ref 0.55–1.02)
Est, Glom Filt Rate: 56 mL/min/{1.73_m2} — ABNORMAL LOW (ref >60)
Globulin: 3.2 g/dL (ref 2.0–4.0)
Glucose: 566 mg/dL — ABNORMAL HIGH (ref 65–100)
Potassium: 3.8 mmol/L (ref 3.5–5.1)
Sodium: 136 mmol/L (ref 136–145)
Total Bilirubin: 0.3 mg/dL (ref 0.2–1.0)
Total Protein: 6.6 g/dL (ref 6.4–8.2)

## 2023-01-22 LAB — TROPONIN: Troponin, High Sensitivity: 11 ng/L (ref 0–51)

## 2023-01-22 MED ORDER — INSULIN REGULAR HUMAN 100 UNIT/ML IJ SOLN
100 | Freq: Once | INTRAMUSCULAR | Status: AC
Start: 2023-01-22 — End: 2023-01-22
  Administered 2023-01-23: 01:00:00 6 [IU] via SUBCUTANEOUS

## 2023-01-22 MED ORDER — KETOROLAC TROMETHAMINE 15 MG/ML IJ SOLN
15 | Freq: Once | INTRAMUSCULAR | Status: AC
Start: 2023-01-22 — End: 2023-01-22
  Administered 2023-01-23: 01:00:00 15 mg via INTRAVENOUS

## 2023-01-22 NOTE — ED Provider Notes (Signed)
EMERGENCY DEPARTMENT HISTORY AND PHYSICAL EXAM      Date: 01/22/2023  Patient Name: Katrina Bowen  MRN: [id number]  Birthdate: 06-23-1961  Date of evaluation: 01/22/2023  Provider: Trish Mage, MD     History of Present Illness     Chief Complaint   Patient presents with    Fatigue       History Provided By: Patient    HPI: Katrina Bowen, 62 y.o. female with past medical history as listed and reviewed below presenting to the ED for evaluation of bilateral lower extremity edema and intermittent fatigue.  Patient has a history of congestive heart failure, prior CAD, she notes over the last few days she has felt weak and has had increased leg swelling, she took Lasix yesterday without any improvement.  She has been off her medications for about a year and a half, she is not seen her cardiologist since she came to [place]  She denies any chest pain, denies any shortness of breath, denies any dyspnea on exertion.  She denies any cough or fever or chills.  She does have a history of asthma and has run out of her medications for that.    Medical History     Past Medical History:  Past Medical History:   Diagnosis Date    Arthritis     Asthma     CHF (congestive heart failure) (HCC)     Chronic pain     Diabetes mellitus (HCC)     Diverticulitis     Fibromyalgia     Heart attack (HCC)     X2    History of ear infections     pt stated multiple    Hyperlipidemia     Hypertension     Hypothyroid     Neuropathy        Past Surgical History:  Past Surgical History:   Procedure Laterality Date    CARDIAC CATHETERIZATION      x3 procedures; 7 stents    CESAREAN SECTION      COLONOSCOPY      COLONOSCOPY N/A 01/08/2022    COLONOSCOPY performed by Era Bumpers, MD at [facility] ENDOSCOPY    EAR SURGERY      pt stated several sx    HYSTERECTOMY (CERVIX STATUS UNKNOWN)         Family History:  Family History   Problem Relation Age of Onset    No Known Problems Mother     No Known Problems Father        Social History:  Social History      Tobacco Use    Smoking status: Former     Types: Cigarettes    Smokeless tobacco: Never   Vaping Use    Vaping Use: Never used   Substance Use Topics    Alcohol use: Yes     Comment: occassional    Drug use: Yes     Types: Marijuana (Weed)       Allergies:  Allergies   Allergen Reactions    Latex Rash     Use paper tape only    Amitriptyline Anaphylaxis and Other (See Comments)     Muscle Cramps      Corticosteroids Anaphylaxis     ALL steroids.         Penicillins Anaphylaxis     Has patient had a PCN reaction causing immediate rash, facial/tongue/throat swelling, SOB or lightheadedness with hypotension: {Yes  Has patient had a PCN reaction causing severe  rash involving mucus membranes or skin necrosis: NO  Has patient had a PCN reaction that required hospitalization NO  Has patient had a PCN reaction occurring within the last 10 years: {Yes  If all of the above answers are "NO", then may proceed with Cephalosporin use.      Pregabalin Anaphylaxis, Shortness Of Breath and Swelling    Duloxetine Other (See Comments)     Other reaction(s): neurological reaction      Gabapentin Swelling       PCP: None, None    Current Medications:   No current facility-administered medications for this encounter.     Current Outpatient Medications   Medication Sig Dispense Refill    aspirin 81 MG EC tablet Take 1 tablet by mouth daily 30 tablet 0    atorvastatin (LIPITOR) 40 MG tablet Take 1 tablet by mouth nightly 30 tablet 0    carvedilol (COREG) 3.125 MG tablet TAKE 1 TABLET BY MOUTH 2 TIMES DAILY WITH A MEAL. 60 tablet 0    clopidogrel (PLAVIX) 75 MG tablet Take 1 tablet by mouth daily 30 tablet 0    furosemide (LASIX) 40 MG tablet Take 1 tablet by mouth daily 60 tablet 0    levothyroxine (SYNTHROID) 175 MCG tablet Take 1 tablet by mouth Daily 30 tablet 0    losartan (COZAAR) 100 MG tablet Take 1 tablet by mouth daily 30 tablet 0    albuterol sulfate HFA (PROVENTIL HFA) 108 (90 Base) MCG/ACT inhaler Inhale 2 puffs into the  lungs every 4 hours as needed for Wheezing 18 g 2    LANTUS SOLOSTAR 100 UNIT/ML injection pen Inject 30 Units into the skin daily 5 Adjustable Dose Pre-filled Pen Syringe 0    naproxen (NAPROSYN) 500 MG tablet Take 1 tablet by mouth 2 times daily (with meals) 20 tablet 0    diclofenac sodium (VOLTAREN) 1 % GEL Apply 2 g topically 4 times daily      empagliflozin (JARDIANCE) 25 MG tablet Take 1 tablet by mouth daily      sertraline (ZOLOFT) 100 MG tablet Take 1 tablet by mouth daily      linaCLOtide (LINZESS) 72 MCG CAPS capsule Take 1 capsule by mouth every morning (before breakfast)      ibuprofen (ADVIL;MOTRIN) 200 MG tablet Take 2 tablets by mouth every 6 hours as needed      acetaminophen (TYLENOL) 500 MG tablet Take 2 tablets by mouth every 6 hours as needed for Pain         Social Determinants of Health:   Social Determinants of Health     Tobacco Use: Medium Risk (01/03/2023)    Patient History     Smoking Tobacco Use: Former     Smokeless Tobacco Use: Never     Passive Exposure: Not on file   Alcohol Use: Not At Risk (01/03/2023)    AUDIT-C     Frequency of Alcohol Consumption: Never     Average Number of Drinks: Patient does not drink     Frequency of Binge Drinking: Never   Physicist, medical Strain: Not on file   Food Insecurity: Not on file   Transportation Needs: Not on file   Physical Activity: Not on file   Stress: Not on file   Social Connections: Not on file   Intimate Partner Violence: Not on file   Depression: Not on file   Housing Stability: Not on file   Interpersonal Safety: Not At Risk (01/22/2023)    Interpersonal  Safety Domain Source: IP Abuse Screening     Physical abuse: Denies     Verbal abuse: Denies     Emotional abuse: Denies     Financial abuse: Denies     Sexual abuse: Denies   Utilities: Not on file       Physical Exam     Vitals:  I reviewed the patient's vital signs  Vitals:    01/22/23 1945 01/22/23 2030 01/22/23 2100 01/22/23 2145   BP: 103/81 125/85 135/86 126/82   Pulse: 90 92 95  64   Resp: 16 16 20 14    Temp:    98.2 F (36.8 C)   TempSrc:    Oral   SpO2: 96% 98% 99% 99%   Weight:       Height:           Physical Exam  Vitals and nursing note reviewed.   HENT:      Nose: Nose normal.      Mouth/Throat:      Mouth: Mucous membranes are moist.   Cardiovascular:      Pulses: Normal pulses.   Pulmonary:      Effort: Pulmonary effort is normal.      Comments: Slightly decreased breath sounds at the bases  Abdominal:      Palpations: Abdomen is soft.   Skin:     General: Skin is warm.      Coloration: Skin is not pale.   Neurological:      General: No focal deficit present.      Mental Status: She is alert.            Medical Decision Making     Records Reviewed: Prior medical records and nursing Notes    62 year old female presenting to the ED for evaluation of extremity swelling.  She notes swelling bilateral lower legs and both of her hands.  She notes she has a history of CHF, has 7 stents and CAD, she has not seen a doctor in a year and a half but just started to develop swelling so came to the emergency department for evaluation.  She notes some intermittent shortness of breath as a history of asthma, she denies any dyspnea on exertion or fever or chills.  Vital signs show hypertension, exam shows bilateral lower extremity edema with normal dorsalis pedis pulses, normal radial pulses.  Slightly decreased breath sounds bilateral lower lobes.  Differential diagnosis includes AKI, CHF exacerbation, ACS, renal failure, liver disease.  Will obtain labs and x-ray and reassess.      Screenings:                         Clinical Management Tools:   Not Applicable    Social Determinants of health affecting management: None    ED Course     Patient was given the following medications:  Medications   insulin regular (HUMULIN R;NOVOLIN R) injection 6 Units (6 Units SubCUTAneous Given 01/22/23 2043)   ketorolac (TORADOL) injection 15 mg (15 mg IntraVENous Given 01/22/23 2043)   albuterol sulfate HFA  (PROVENTIL;VENTOLIN;PROAIR) 108 (90 Base) MCG/ACT inhaler 2 puff (2 puffs Inhalation Given 01/22/23 2149)       ED Course and Reassessments:  ED Course as of 01/23/23 0005   Wed Jan 22, 2023   1946 NT Pro-BNP(!): 147 [PC]   1946 Glucose(!): 566 [PC]   1957 Anion Gap: 8 [PC]      ED Course User Index  [  PC] Isa Hitz, Althea Grimmer, MD   Labs show elevated glucose which is chronic, there is normal anion gap, troponin and proBNP are unremarkable, no need to repeat given the duration of symptoms, CBC is unremarkable as well and patient's chest x-ray is clear.  She ambulated around the emergency department had normal vital signs and normal ambulatory pulse ox.  She is chronically ill and she needs of follow-up with cardiology but she does not have any criteria for inpatient admission or any indications for further testing at this point.  She is stable for discharge.      Sepsis Reassessment: Sepsis reassessment not applicable    Consults:  None     Results     Labs:  Recent Results (from the past 12 hour(s))   CBC with Auto Differential    Collection Time: 01/22/23  6:09 PM   Result Value Ref Range    WBC 6.6 3.6 - 11.0 K/uL    RBC 4.60 3.80 - 5.20 M/uL    Hemoglobin 14.0 11.5 - 16.0 g/dL    Hematocrit 16.1 09.6 - 47.0 %    MCV 89.6 80.0 - 99.0 FL    MCH 30.4 26.0 - 34.0 PG    MCHC 34.0 30.0 - 36.5 g/dL    RDW 04.5 40.9 - 81.1 %    Platelets 201 150 - 400 K/uL    MPV 9.5 8.9 - 12.9 FL    Nucleated RBCs 0.0 0.0 PER 100 WBC    nRBC 0.00 0.00 - 0.01 K/uL    Neutrophils % 65 32 - 75 %    Lymphocytes % 28 12 - 49 %    Monocytes % 4 (L) 5 - 13 %    Eosinophils % 1 0 - 7 %    Basophils % 1 0 - 1 %    Immature Granulocytes % 1 (H) 0 - 0.5 %    Neutrophils Absolute 4.3 1.8 - 8.0 K/UL    Lymphocytes Absolute 1.8 0.8 - 3.5 K/UL    Monocytes Absolute 0.2 0.0 - 1.0 K/UL    Eosinophils Absolute 0.1 0.0 - 0.4 K/UL    Basophils Absolute 0.1 0.0 - 0.1 K/UL    Immature Granulocytes Absolute 0.1 (H) 0.00 - 0.04 K/UL    Differential Type AUTOMATED      Comprehensive Metabolic Panel    Collection Time: 01/22/23  6:09 PM   Result Value Ref Range    Sodium 136 136 - 145 mmol/L    Potassium 3.8 3.5 - 5.1 mmol/L    Chloride 99 97 - 108 mmol/L    CO2 29 21 - 32 mmol/L    Anion Gap 8 5 - 15 mmol/L    Glucose 566 (H) 65 - 100 mg/dL    BUN 11 6 - 20 mg/dL    Creatinine 9.14 (H) 0.55 - 1.02 mg/dL    BUN/Creatinine Ratio 10 (L) 12 - 20      Est, Glom Filt Rate 56 (L) >60 ml/min/1.67m2    Calcium 8.6 8.5 - 10.1 mg/dL    Total Bilirubin 0.3 0.2 - 1.0 mg/dL    AST 51 (H) 15 - 37 U/L    ALT 40 12 - 78 U/L    Alk Phosphatase 84 45 - 117 U/L    Total Protein 6.6 6.4 - 8.2 g/dL    Albumin 3.4 (L) 3.5 - 5.0 g/dL    Globulin 3.2 2.0 - 4.0 g/dL    Albumin/Globulin Ratio 1.1 1.1 - 2.2  Brain Natriuretic Peptide    Collection Time: 01/22/23  6:09 PM   Result Value Ref Range    NT Pro-BNP 147 (H) <125 pg/mL   Troponin    Collection Time: 01/22/23  6:09 PM   Result Value Ref Range    Troponin, High Sensitivity 11 0 - 51 ng/L   POCT Glucose    Collection Time: 01/22/23  8:06 PM   Result Value Ref Range    POC Glucose 423 (H) 65 - 100 mg/dL    Performed by: Lanny Cramp NICOLE    POCT Glucose    Collection Time: 01/22/23  9:39 PM   Result Value Ref Range    POC Glucose 426 (H) 65 - 100 mg/dL    Performed by: Lanny Cramp NICOLE        Radiologic Studies:  Non-plain film images such as CT, Ultrasound and MRI are read by the radiologist. Plain radiographic images are visualized and preliminarily interpreted by the ED Provider with the below findings:    Interpreted by me as no pneumothorax    Interpretation per the Radiologist below, if available at the time of this note:  XR CHEST PORTABLE   Final Result      No acute findings.         Electronically signed by Kevan Rosebush           Diagnosis     Clinical Impression:   1. Bilateral leg edema        Disposition & Disposition Considerations     DISPOSITION Decision To Discharge 01/22/2023 09:56:19 PM      Discharge Note: The patient is stable for  discharge. The signs, symptoms, diagnosis, and discharge instructions have been discussed, understanding conveyed between all parties, and agreed upon. Understanding was insured that at this time there is no evidence for a more malignant underlying process, but that early in the process of an illness, an emergency department workup can be falsely reassuring.  Routine discharge counseling was given including the fact that any worsening, changing, or persistent symptoms should prompt an immediate call or follow up with their primary physician or the emergency department. The importance of appropriate follow up was also discussed as was the importance of review of all lab work and imaging conducted in the emergency department with an outside physician. More extensive discharge instructions were given in the patient's discharge paperwork. After completion of evaluation and discussion of results and diagnoses, all the questions were answered. If required, all follow up appointments and treatments were discussed and explained. Understanding was insured prior to discharge.    Additional Disposition Considerations:      Smoking Cessation:Not Applicable    DISCHARGE PLAN:  1.   Current Discharge Medication List        CONTINUE these medications which have NOT CHANGED    Details   aspirin 81 MG EC tablet Take 1 tablet by mouth daily  Qty: 30 tablet, Refills: 0      atorvastatin (LIPITOR) 40 MG tablet Take 1 tablet by mouth nightly  Qty: 30 tablet, Refills: 0      carvedilol (COREG) 3.125 MG tablet TAKE 1 TABLET BY MOUTH 2 TIMES DAILY WITH A MEAL.  Qty: 60 tablet, Refills: 0      clopidogrel (PLAVIX) 75 MG tablet Take 1 tablet by mouth daily  Qty: 30 tablet, Refills: 0      furosemide (LASIX) 40 MG tablet Take 1 tablet by mouth daily  Qty: 60 tablet, Refills: 0  levothyroxine (SYNTHROID) 175 MCG tablet Take 1 tablet by mouth Daily  Qty: 30 tablet, Refills: 0      losartan (COZAAR) 100 MG tablet Take 1 tablet by mouth  daily  Qty: 30 tablet, Refills: 0      albuterol sulfate HFA (PROVENTIL HFA) 108 (90 Base) MCG/ACT inhaler Inhale 2 puffs into the lungs every 4 hours as needed for Wheezing  Qty: 18 g, Refills: 2      LANTUS SOLOSTAR 100 UNIT/ML injection pen Inject 30 Units into the skin daily  Qty: 5 Adjustable Dose Pre-filled Pen Syringe, Refills: 0      naproxen (NAPROSYN) 500 MG tablet Take 1 tablet by mouth 2 times daily (with meals)  Qty: 20 tablet, Refills: 0      diclofenac sodium (VOLTAREN) 1 % GEL Apply 2 g topically 4 times daily      empagliflozin (JARDIANCE) 25 MG tablet Take 1 tablet by mouth daily      sertraline (ZOLOFT) 100 MG tablet Take 1 tablet by mouth daily      linaCLOtide (LINZESS) 72 MCG CAPS capsule Take 1 capsule by mouth every morning (before breakfast)      ibuprofen (ADVIL;MOTRIN) 200 MG tablet Take 2 tablets by mouth every 6 hours as needed      acetaminophen (TYLENOL) 500 MG tablet Take 2 tablets by mouth every 6 hours as needed for Pain            2. Johnnette Litter, MD  [phone] [facility]  [place] [address]  [phone]    Call in 1 day      [facility] EMERGENCY DEPT  [address]  [place] [address]  [phone]  Go to   If symptoms worsen, As needed    3.  Return to ED if worse    4.      Medication List        ASK your doctor about these medications      albuterol sulfate HFA 108 (90 Base) MCG/ACT inhaler  Commonly known as: Proventil HFA  Inhale 2 puffs into the lungs every 4 hours as needed for Wheezing     aspirin 81 MG EC tablet  Take 1 tablet by mouth daily     atorvastatin 40 MG tablet  Commonly known as: LIPITOR  Take 1 tablet by mouth nightly     carvedilol 3.125 MG tablet  Commonly known as: COREG  TAKE 1 TABLET BY MOUTH 2 TIMES DAILY WITH A MEAL.     clopidogrel 75 MG tablet  Commonly known as: PLAVIX  Take 1 tablet by mouth daily     diclofenac sodium 1 % Gel  Commonly known as: VOLTAREN     empagliflozin 25 MG tablet  Commonly known as:  JARDIANCE     furosemide 40 MG tablet  Commonly known as: LASIX  Take 1 tablet by mouth daily     ibuprofen 200 MG tablet  Commonly known as: ADVIL;MOTRIN     Lantus SoloStar 100 UNIT/ML injection pen  Generic drug: insulin glargine  Inject 30 Units into the skin daily     levothyroxine 175 MCG tablet  Commonly known as: SYNTHROID  Take 1 tablet by mouth Daily     linaCLOtide 72 MCG Caps capsule  Commonly known as: LINZESS     losartan 100 MG tablet  Commonly known as: COZAAR  Take 1 tablet by mouth daily     naproxen 500 MG tablet  Commonly known as: NAPROSYN  Take 1 tablet by mouth 2 times daily (with meals)     sertraline 100 MG tablet  Commonly known as: ZOLOFT     TYLENOL 500 MG tablet  Generic drug: acetaminophen            5. Discontinued Medications:   Discharge Medication List as of 01/22/2023  9:58 PM          Procedures     Unless otherwise noted below, none.    Performed by: Trish Mage, MD   Procedures      Critical Care Time     Critical care billing criteria and/or time were NOT met.    Documentation     I am the Primary Clinician of Record: Trish Mage, MD (electronically signed)    (Please note that parts of this dictation were completed with voice recognition software. Quite often unanticipated grammatical, syntax, homophones, and other interpretive errors are inadvertently transcribed by the computer software. Please disregards these errors. Please excuse any errors that have escaped final proofreading.)        Alexsandra Shontz, Althea Grimmer, MD  01/23/23 0005

## 2023-01-22 NOTE — ED Notes (Signed)
Pt placed on continuous cardiac monitoring, pulse ox, and blood pressure monitoring at this time.

## 2023-01-22 NOTE — ED Notes (Signed)
Assumed care of patient.  A&Ox4; no acute distress, no respiratory distress.

## 2023-01-22 NOTE — Discharge Instructions (Signed)
Thank you!    Thank you for allowing me to care for you in the emergency department.  I sincerely hope that you are satisfied with your visit today.  It is my goal to provide you with excellent care.    Below you will find a list of your labs and imaging from your visit today if applicable. Should you have any questions regarding these results please do not hesitate to call the emergency department. Please review MyChart for a more detailed result list since the below list may not be comprehensive. Instructions on how to sign up to MyChart should be provided in this packet.    Labs -     Recent Results (from the past 12 hour(s))   CBC with Auto Differential    Collection Time: 01/22/23  6:09 PM   Result Value Ref Range    WBC 6.6 3.6 - 11.0 K/uL    RBC 4.60 3.80 - 5.20 M/uL    Hemoglobin 14.0 11.5 - 16.0 g/dL    Hematocrit 45.4 09.8 - 47.0 %    MCV 89.6 80.0 - 99.0 FL    MCH 30.4 26.0 - 34.0 PG    MCHC 34.0 30.0 - 36.5 g/dL    RDW 11.9 14.7 - 82.9 %    Platelets 201 150 - 400 K/uL    MPV 9.5 8.9 - 12.9 FL    Nucleated RBCs 0.0 0.0 PER 100 WBC    nRBC 0.00 0.00 - 0.01 K/uL    Neutrophils % 65 32 - 75 %    Lymphocytes % 28 12 - 49 %    Monocytes % 4 (L) 5 - 13 %    Eosinophils % 1 0 - 7 %    Basophils % 1 0 - 1 %    Immature Granulocytes % 1 (H) 0 - 0.5 %    Neutrophils Absolute 4.3 1.8 - 8.0 K/UL    Lymphocytes Absolute 1.8 0.8 - 3.5 K/UL    Monocytes Absolute 0.2 0.0 - 1.0 K/UL    Eosinophils Absolute 0.1 0.0 - 0.4 K/UL    Basophils Absolute 0.1 0.0 - 0.1 K/UL    Immature Granulocytes Absolute 0.1 (H) 0.00 - 0.04 K/UL    Differential Type AUTOMATED     Comprehensive Metabolic Panel    Collection Time: 01/22/23  6:09 PM   Result Value Ref Range    Sodium 136 136 - 145 mmol/L    Potassium 3.8 3.5 - 5.1 mmol/L    Chloride 99 97 - 108 mmol/L    CO2 29 21 - 32 mmol/L    Anion Gap 8 5 - 15 mmol/L    Glucose 566 (H) 65 - 100 mg/dL    BUN 11 6 - 20 mg/dL    Creatinine 5.62 (H) 0.55 - 1.02 mg/dL    BUN/Creatinine Ratio 10  (L) 12 - 20      Est, Glom Filt Rate 56 (L) >60 ml/min/1.69m2    Calcium 8.6 8.5 - 10.1 mg/dL    Total Bilirubin 0.3 0.2 - 1.0 mg/dL    AST 51 (H) 15 - 37 U/L    ALT 40 12 - 78 U/L    Alk Phosphatase 84 45 - 117 U/L    Total Protein 6.6 6.4 - 8.2 g/dL    Albumin 3.4 (L) 3.5 - 5.0 g/dL    Globulin 3.2 2.0 - 4.0 g/dL    Albumin/Globulin Ratio 1.1 1.1 - 2.2     Brain Natriuretic Peptide  Collection Time: 01/22/23  6:09 PM   Result Value Ref Range    NT Pro-BNP 147 (H) <125 pg/mL   Troponin    Collection Time: 01/22/23  6:09 PM   Result Value Ref Range    Troponin, High Sensitivity 11 0 - 51 ng/L   POCT Glucose    Collection Time: 01/22/23  8:06 PM   Result Value Ref Range    POC Glucose 423 (H) 65 - 100 mg/dL    Performed by: Lanny Cramp NICOLE    POCT Glucose    Collection Time: 01/22/23  9:39 PM   Result Value Ref Range    POC Glucose 426 (H) 65 - 100 mg/dL    Performed by: Lanny Cramp NICOLE        Radiologic Studies -   XR CHEST PORTABLE   Final Result      No acute findings.         Electronically signed by Kevan Rosebush        @CT48 @  @CXR48 @       If you feel that you have not received excellent quality care or timely care, please ask to speak to the nurse manager. Please choose [place] in the future for your continued health care needs.   ------------------------------------------------------------------------------------------------------------  The exam and treatment you received in the Emergency Department were for an urgent problem and are not intended as complete care. It is very important that you follow-up with a doctor, nurse practitioner, or physician assistant in a timely manner to:  (1) confirm your diagnosis and review all imaging and lab results,  (2) re-evaluation of changes in your illness and treatment, and  (3) for ongoing care.  If your symptoms become worse or you do not improve as expected and you are unable to reach your usual health care provider, you should return to the Emergency Department. We are  available 24 hours a day.     Please take your discharge instructions with you when you go to your follow-up appointment.     If a prescription has been provided, please have it filled as soon as possible to prevent a delay in treatment. Read the entire medication instruction sheet provided to you by the pharmacy. If you have any questions or reservations about taking the medication due to side effects or interactions with other medications, please call your primary care physician or contact the ER to speak with the charge nurse.     Make an appointment with your family doctor or the physician you were referred to for follow-up of this visit as instructed on your discharge paperwork, as this is a mandatory follow-up. Return to the ER if you are unable to be seen or if you are unable to be seen in a timely manner.    If you have any problem arranging the follow-up visit, contact the Emergency Department immediately.

## 2023-01-22 NOTE — ED Notes (Signed)
Ambulated patient with SpO2 monitoring.  Patient with no complaints, ambulated with no difficulties.  No increase in heart rate or work of breathing.    Dr. Leandro Reasoner notified.  Will continue with discharge.

## 2023-01-22 NOTE — ED Triage Notes (Signed)
Weakness, bs 530, swollen feet/hands, reports out of home meds, can't refill till 01/30/23

## 2023-01-22 NOTE — ED Notes (Signed)
Went to medicate patient, noticed patient's heart rate to increase to 150-160s.  Patient went from a right side lying position to laying on her back.  Patient with no complaints at this time.    Dr. Leandro Reasoner notified, states to medicate then continue monitoring patient.  Will perform road test after monitoring.

## 2023-01-23 LAB — POCT GLUCOSE
POC Glucose: 423 mg/dL — ABNORMAL HIGH (ref 65–100)
POC Glucose: 426 mg/dL — ABNORMAL HIGH (ref 65–100)

## 2023-01-23 MED ORDER — ALBUTEROL SULFATE HFA 108 (90 BASE) MCG/ACT IN AERS
108 | Freq: Once | RESPIRATORY_TRACT | Status: AC
Start: 2023-01-23 — End: 2023-01-22
  Administered 2023-01-23: 02:00:00 2 via RESPIRATORY_TRACT

## 2023-01-23 MED FILL — HUMULIN R 100 UNIT/ML IJ SOLN: 100 UNIT/ML | INTRAMUSCULAR | Qty: 6

## 2023-01-23 MED FILL — ALBUTEROL SULFATE HFA 108 (90 BASE) MCG/ACT IN AERS: 108 (90 Base) MCG/ACT | RESPIRATORY_TRACT | Qty: 0.27

## 2023-01-23 MED FILL — KETOROLAC TROMETHAMINE 15 MG/ML IJ SOLN: 15 MG/ML | INTRAMUSCULAR | Qty: 1

## 2023-10-22 ENCOUNTER — Emergency Department: Payer: Medicare (Managed Care)

## 2023-10-22 ENCOUNTER — Inpatient Hospital Stay
Admit: 2023-10-22 | Discharge: 2023-10-23 | Disposition: A | Discharge status: Home / Self Care | Payer: Medicare (Managed Care) | Attending: Emergency Medicine

## 2023-10-22 ENCOUNTER — Emergency Department: Admit: 2023-10-22 | Payer: Medicare (Managed Care)

## 2023-10-22 DIAGNOSIS — R0600 Dyspnea, unspecified: Secondary | ICD-10-CM

## 2023-10-22 LAB — CBC WITH AUTO DIFFERENTIAL
Basophils %: 1.2 % — ABNORMAL HIGH (ref 0.0–1.0)
Basophils Absolute: 0.08 10*3/uL (ref 0.00–0.10)
Eosinophils %: 1.1 % (ref 0.0–7.0)
Eosinophils Absolute: 0.07 10*3/uL (ref 0.00–0.40)
Hematocrit: 38.9 % (ref 35.0–47.0)
Hemoglobin: 13.5 g/dL (ref 11.5–16.0)
Immature Granulocytes %: 0.8 % — ABNORMAL HIGH (ref 0–0.5)
Immature Granulocytes Absolute: 0.05 10*3/uL — ABNORMAL HIGH (ref 0.00–0.04)
Lymphocytes %: 17.8 % (ref 12.0–49.0)
Lymphocytes Absolute: 1.18 10*3/uL (ref 0.80–3.50)
MCH: 30.4 pg (ref 26.0–34.0)
MCHC: 34.7 g/dL (ref 30.0–36.5)
MCV: 87.6 FL (ref 80.0–99.0)
MPV: 9.4 FL (ref 8.9–12.9)
Monocytes %: 9.6 % (ref 5.0–13.0)
Monocytes Absolute: 0.64 10*3/uL (ref 0.00–1.00)
Neutrophils %: 69.5 % (ref 32.0–75.0)
Neutrophils Absolute: 4.62 10*3/uL (ref 1.80–8.00)
Nucleated RBCs: 0 /100{WBCs}
Platelets: 177 10*3/uL (ref 150–400)
RBC: 4.44 M/uL (ref 3.80–5.20)
RDW: 13.1 % (ref 11.5–14.5)
WBC: 6.6 10*3/uL (ref 3.6–11.0)
nRBC: 0 10*3/uL (ref 0.00–0.01)

## 2023-10-22 LAB — COMPREHENSIVE METABOLIC PANEL
ALT: 34 U/L (ref 12–78)
AST: 30 U/L (ref 15–37)
Albumin/Globulin Ratio: 1.1 (ref 1.1–2.2)
Albumin: 3.2 g/dL — ABNORMAL LOW (ref 3.5–5.0)
Alk Phosphatase: 73 U/L (ref 45–117)
Anion Gap: 4 mmol/L (ref 2–12)
BUN/Creatinine Ratio: 17 (ref 12–20)
BUN: 13 mg/dL (ref 6–20)
CO2: 30 mmol/L (ref 21–32)
Calcium: 7.5 mg/dL — ABNORMAL LOW (ref 8.5–10.1)
Chloride: 104 mmol/L (ref 97–108)
Creatinine: 0.76 mg/dL (ref 0.55–1.02)
Est, Glom Filt Rate: 88 mL/min/{1.73_m2} (ref >60)
Globulin: 3 g/dL (ref 2.0–4.0)
Glucose: 82 mg/dL (ref 65–100)
Potassium: 3 mmol/L — ABNORMAL LOW (ref 3.5–5.1)
Sodium: 138 mmol/L (ref 136–145)
Total Bilirubin: 0.7 mg/dL (ref 0.2–1.0)
Total Protein: 6.2 g/dL — ABNORMAL LOW (ref 6.4–8.2)

## 2023-10-22 LAB — TROPONIN: Troponin, High Sensitivity: 10 ng/L (ref 0–51)

## 2023-10-22 LAB — MAGNESIUM: Magnesium: 1.4 mg/dL — ABNORMAL LOW (ref 1.6–2.4)

## 2023-10-22 MED ORDER — SODIUM CHLORIDE 0.9 % IV BOLUS
0.9 | INTRAVENOUS | Status: AC
Start: 2023-10-22 — End: 2023-10-22
  Administered 2023-10-22: 21:00:00 1000 mL via INTRAVENOUS

## 2023-10-22 MED ORDER — POTASSIUM CHLORIDE ER 10 MEQ PO TBCR
10 | Freq: Once | ORAL | Status: AC
Start: 2023-10-22 — End: 2023-10-22
  Administered 2023-10-22: 23:00:00 40 meq via ORAL

## 2023-10-22 MED ORDER — DIPHENOXYLATE-ATROPINE 2.5-0.025 MG PO TABS
2.5-0.025 | Freq: Four times a day (QID) | ORAL | Status: DC | PRN
Start: 2023-10-22 — End: 2023-10-23
  Administered 2023-10-22: 21:00:00 1 via ORAL

## 2023-10-22 MED ORDER — MAGNESIUM SULFATE IN D5W 1-5 GM/100ML-% IV SOLN
1-5 | INTRAVENOUS | Status: AC
Start: 2023-10-22 — End: 2023-10-22
  Administered 2023-10-22: 23:00:00 1000 mg via INTRAVENOUS

## 2023-10-22 MED FILL — DIPHENOXYLATE-ATROPINE 2.5-0.025 MG PO TABS: 2.5-0.025 MG | ORAL | Qty: 1 | Fill #0

## 2023-10-22 MED FILL — MAGNESIUM SULFATE IN D5W 1-5 GM/100ML-% IV SOLN: 1-5 GM/100ML-% | INTRAVENOUS | Qty: 100 | Fill #0

## 2023-10-22 MED FILL — SODIUM CHLORIDE 0.9 % IV SOLN: 0.9 % | INTRAVENOUS | Qty: 1000 | Fill #0

## 2023-10-22 MED FILL — POTASSIUM CHLORIDE ER 10 MEQ PO TBCR: 10 MEQ | ORAL | Qty: 4 | Fill #0

## 2023-10-22 NOTE — ED Triage Notes (Signed)
 Arrives via EMS from home, reports shortness of breath, generalized weakness and diarrhea that started yesterday. Pt received approx 100cc NS in route. Took an antidiarrheal agent prior to calling EMS.     GCS 15, ambulatory upon arrival.

## 2023-10-22 NOTE — ED Notes (Signed)
 Pt provided juice and a snack, ED provider aware of BG.

## 2023-10-22 NOTE — ED Provider Notes (Signed)
 [facility]  EMERGENCY DEPARTMENT HISTORY AND PHYSICAL EXAM      Date of evaluation: 10/22/2023  Patient Name: Katrina Bowen 13-Feb-1961  MRN: [id number]  ED Provider: Joaquin Mulberry, MD   Note Started: 5:13 PM EDT 10/22/23    HISTORY OF PRESENT ILLNESS     Chief Complaint   Patient presents with    Shortness of Breath    Diarrhea       History Provided By: Patient, only     HPI: Katrina Bowen is a 63 y.o. female with complicated past medical history to include 7 stents presents with 24 hours of 42 episodes of watery diarrhea with weakness and chest pain now.  Patient states she also has shortness of breath.  Says she does not feel like a heart attack.  She denies any fever, chills, nausea, vomiting, rash, headache, night sweats.  She says she just feels weak and tired at this point she describes the pain as sharp nonradiating in the middle of her chest    PAST MEDICAL HISTORY   Past Medical History:  Past Medical History:   Diagnosis Date    Arthritis     Asthma     CHF (congestive heart failure) (HCC)     Chronic pain     Diabetes mellitus (HCC)     Diverticulitis     Fibromyalgia     Heart attack (HCC)     X2    History of ear infections     pt stated multiple    Hyperlipidemia     Hypertension     Hypothyroid     Neuropathy        Past Surgical History:  Past Surgical History:   Procedure Laterality Date    CARDIAC CATHETERIZATION      x3 procedures; 7 stents    CESAREAN SECTION      COLONOSCOPY      COLONOSCOPY N/A 01/08/2022    COLONOSCOPY performed by Claris Crook, MD at [facility] ENDOSCOPY    EAR SURGERY      pt stated several sx    HYSTERECTOMY (CERVIX STATUS UNKNOWN)         Family History:  Family History   Problem Relation Age of Onset    No Known Problems Mother     No Known Problems Father        Social History:  Social History     Tobacco Use    Smoking status: Former     Types: Cigarettes    Smokeless tobacco: Never   Vaping Use    Vaping status: Never Used   Substance Use Topics    Alcohol  use: Yes     Comment: occassional    Drug use: Yes     Types: Marijuana (Weed)       Allergies:  Allergies   Allergen Reactions    Latex Rash     Use paper tape only    Amitriptyline Anaphylaxis and Other (See Comments)     Muscle Cramps      Corticosteroids Anaphylaxis     ALL steroids.         Penicillins Anaphylaxis     Has patient had a PCN reaction causing immediate rash, facial/tongue/throat swelling, SOB or lightheadedness with hypotension: {Yes  Has patient had a PCN reaction causing severe rash involving mucus membranes or skin necrosis: NO  Has patient had a PCN reaction that required hospitalization NO  Has patient had a PCN reaction occurring  within the last 10 years: {Yes  If all of the above answers are "NO", then may proceed with Cephalosporin use.      Pregabalin Anaphylaxis, Shortness Of Breath and Swelling    Duloxetine Other (See Comments)     Other reaction(s): neurological reaction      Gabapentin Swelling       PCP: None, None    Current Meds:   Current Facility-Administered Medications   Medication Dose Route Frequency Provider Last Rate Last Admin    diphenoxylate -atropine  (LOMOTIL ) 2.5-0.025 MG per tablet 1 tablet  1 tablet Oral 4x Daily PRN Margot Sheng, MD   1 tablet at 10/22/23 1720     Current Outpatient Medications   Medication Sig Dispense Refill    ondansetron (ZOFRAN-ODT) 4 MG disintegrating tablet Take 1 tablet by mouth 3 times daily as needed for Nausea or Vomiting 21 tablet 0    aspirin  81 MG EC tablet Take 1 tablet by mouth daily 30 tablet 0    atorvastatin  (LIPITOR) 40 MG tablet Take 1 tablet by mouth nightly 30 tablet 0    carvedilol  (COREG ) 3.125 MG tablet TAKE 1 TABLET BY MOUTH 2 TIMES DAILY WITH A MEAL. 60 tablet 0    clopidogrel  (PLAVIX ) 75 MG tablet Take 1 tablet by mouth daily 30 tablet 0    furosemide  (LASIX ) 40 MG tablet Take 1 tablet by mouth daily 60 tablet 0    levothyroxine  (SYNTHROID ) 175 MCG tablet Take 1 tablet by mouth Daily 30 tablet 0    losartan   (COZAAR ) 100 MG tablet Take 1 tablet by mouth daily 30 tablet 0    albuterol  sulfate HFA (PROVENTIL  HFA) 108 (90 Base) MCG/ACT inhaler Inhale 2 puffs into the lungs every 4 hours as needed for Wheezing 18 g 2    LANTUS  SOLOSTAR 100 UNIT/ML injection pen Inject 30 Units into the skin daily 5 Adjustable Dose Pre-filled Pen Syringe 0    naproxen  (NAPROSYN ) 500 MG tablet Take 1 tablet by mouth 2 times daily (with meals) 20 tablet 0    diclofenac sodium (VOLTAREN) 1 % GEL Apply 2 g topically 4 times daily      empagliflozin (JARDIANCE) 25 MG tablet Take 1 tablet by mouth daily      sertraline (ZOLOFT) 100 MG tablet Take 1 tablet by mouth daily      linaCLOtide (LINZESS) 72 MCG CAPS capsule Take 1 capsule by mouth every morning (before breakfast)      ibuprofen (ADVIL;MOTRIN) 200 MG tablet Take 2 tablets by mouth every 6 hours as needed      acetaminophen (TYLENOL) 500 MG tablet Take 2 tablets by mouth every 6 hours as needed for Pain         Social Determinants of Health:   Social Drivers of Health     Tobacco Use: Medium Risk (10/22/2023)    Patient History     Smoking Tobacco Use: Former     Smokeless Tobacco Use: Never     Passive Exposure: Not on file   Alcohol Use: Not At Risk (10/22/2023)    AUDIT-C     Frequency of Alcohol Consumption: Never     Average Number of Drinks: Patient does not drink     Frequency of Binge Drinking: Never   Financial Resource Strain: Not on file   Food Insecurity: Not on file   Transportation Needs: Not on file   Physical Activity: Not on file   Stress: Not on file   Social Connections: Not  on file   Intimate Partner Violence: Not on file   Depression: Not on file   Housing Stability: Not on file   Interpersonal Safety: Not At Risk (01/22/2023)    Interpersonal Safety Domain Source: IP Abuse Screening     Physical abuse: Denies     Verbal abuse: Denies     Emotional abuse: Denies     Financial abuse: Denies     Sexual abuse: Denies   Utilities: Not on file     PHYSICAL EXAM   Physical  Exam  Constitutional:       Appearance: Normal appearance.   HENT:      Head: Normocephalic and atraumatic.      Right Ear: External ear normal.      Left Ear: External ear normal.      Nose: Nose normal.      Mouth/Throat:      Mouth: Mucous membranes are moist.   Eyes:      Extraocular Movements: Extraocular movements intact.      Conjunctiva/sclera: Conjunctivae normal.      Pupils: Pupils are equal, round, and reactive to light.   Cardiovascular:      Rate and Rhythm: Normal rate and regular rhythm.      Pulses: Normal pulses.      Heart sounds: Normal heart sounds.   Pulmonary:      Effort: Pulmonary effort is normal.      Breath sounds: Normal breath sounds. No decreased breath sounds, wheezing, rhonchi or rales.   Chest:      Chest wall: No mass, deformity, tenderness or crepitus.   Abdominal:      General: Abdomen is flat. Bowel sounds are normal.      Palpations: Abdomen is soft.   Musculoskeletal:         General: No swelling, tenderness or signs of injury. Normal range of motion.      Cervical back: Normal range of motion and neck supple.   Skin:     General: Skin is warm and dry.      Capillary Refill: Capillary refill takes less than 2 seconds.   Neurological:      General: No focal deficit present.      Mental Status: She is alert and oriented to person, place, and time.   Psychiatric:         Mood and Affect: Mood normal.         Behavior: Behavior normal.         Thought Content: Thought content normal.         Judgment: Judgment normal.       SCREENINGS     NIH Stroke Scale  NIH Stroke Scale Assessed: No          No data recorded       LAB, EKG AND DIAGNOSTIC RESULTS   Labs:  Recent Results (from the past 12 hours)   CBC with Auto Differential    Collection Time: 10/22/23  5:02 PM   Result Value Ref Range    WBC 6.6 3.6 - 11.0 K/uL    RBC 4.44 3.80 - 5.20 M/uL    Hemoglobin 13.5 11.5 - 16.0 g/dL    Hematocrit 16.1 09.6 - 47.0 %    MCV 87.6 80.0 - 99.0 FL    MCH 30.4 26.0 - 34.0 PG    MCHC 34.7 30.0  - 36.5 g/dL    RDW 04.5 40.9 - 81.1 %    Platelets 177 150 -  400 K/uL    MPV 9.4 8.9 - 12.9 FL    Nucleated RBCs 0.0 0.0 PER 100 WBC    nRBC 0.00 0.00 - 0.01 K/uL    Neutrophils % 69.5 32.0 - 75.0 %    Lymphocytes % 17.8 12.0 - 49.0 %    Monocytes % 9.6 5.0 - 13.0 %    Eosinophils % 1.1 0.0 - 7.0 %    Basophils % 1.2 (H) 0.0 - 1.0 %    Immature Granulocytes % 0.8 (H) 0 - 0.5 %    Neutrophils Absolute 4.62 1.80 - 8.00 K/UL    Lymphocytes Absolute 1.18 0.80 - 3.50 K/UL    Monocytes Absolute 0.64 0.00 - 1.00 K/UL    Eosinophils Absolute 0.07 0.00 - 0.40 K/UL    Basophils Absolute 0.08 0.00 - 0.10 K/UL    Immature Granulocytes Absolute 0.05 (H) 0.00 - 0.04 K/UL    Differential Type AUTOMATED     Comprehensive Metabolic Panel    Collection Time: 10/22/23  5:02 PM   Result Value Ref Range    Sodium 138 136 - 145 mmol/L    Potassium 3.0 (L) 3.5 - 5.1 mmol/L    Chloride 104 97 - 108 mmol/L    CO2 30 21 - 32 mmol/L    Anion Gap 4 2 - 12 mmol/L    Glucose 82 65 - 100 mg/dL    BUN 13 6 - 20 mg/dL    Creatinine 4.09 8.11 - 1.02 mg/dL    BUN/Creatinine Ratio 17 12 - 20      Est, Glom Filt Rate 88 >60 ml/min/1.2m2    Calcium  7.5 (L) 8.5 - 10.1 mg/dL    Total Bilirubin 0.7 0.2 - 1.0 mg/dL    AST 30 15 - 37 U/L    ALT 34 12 - 78 U/L    Alk Phosphatase 73 45 - 117 U/L    Total Protein 6.2 (L) 6.4 - 8.2 g/dL    Albumin 3.2 (L) 3.5 - 5.0 g/dL    Globulin 3.0 2.0 - 4.0 g/dL    Albumin/Globulin Ratio 1.1 1.1 - 2.2     Magnesium     Collection Time: 10/22/23  5:02 PM   Result Value Ref Range    Magnesium  1.4 (L) 1.6 - 2.4 mg/dL   Troponin "IF" patient is greater than 55 years of age or has a history of cardiac disease.    Collection Time: 10/22/23  5:02 PM   Result Value Ref Range    Troponin, High Sensitivity 10 0 - 51 ng/L       EKG:.EKG interpreted by me. Shows Normal Sinus Rhythm with a HR of 85.  No STEMI.    Radiologic Studies:  Radiographic images are visualized and preliminarily interpreted by the ED Provider with the following  findings: Xray Interpreted by me.  Shows no appreciable acute process .     Interpretation per the Radiologist below, if available at the time of this note:  XR CHEST (2 VW)   Final Result   No acute cardiopulmonary disease.         Electronically signed by Ira Mann, MD           Records Reviewed: Prior non-ED medical records reviewed and interpreted by me. See ED Course for summary.     MEDICAL DECISION MAKING and ED COURSE   9:51 PM Differential and Considerations of tests not ordered: Patient appears to have some level of gastroenteritis.  Will rehydrate and  assess with basic and cardiac labs assessing for dehydration, ACS, arrhythmia, electrolyte abnormality.  Abdomen is benign no reason for CT scan at this point in time. she does not have SIRS criteria indicative of sepsis.     Vitals:    Vitals:    10/22/23 1659 10/22/23 1915   BP: 106/69 101/78   Pulse: 86 89   Resp: 18 18   Temp: 98.8 F (37.1 C) 98.6 F (37 C)   TempSrc: Oral Oral   SpO2: 96% 93%   Weight: 72.6 kg (160 lb)    Height: 1.575 m (5\' 2" )        ED COURSE  ED Course as of 10/22/23 2151   Wed Oct 22, 2023   1715 Patient hemoglobin is normal no evidence of blood in her stool she conveys and she remains hemodynamically stable [CS]   2150 Patient's been reexamined and abdomen is nontender she does have some mild electrolyte abnormalities which have been repleted in the emergency room will encourage the prevention of ongoing loss with Zofran at home.  Will have her follow-up with PCP as an outpatient. [CS]      ED Course User Index  [CS] Margot Sheng, MD       Clinical Management Tools:  Not Applicable    Smoking Cessation: Not Applicable    Patient was given the following medications:  Medications   diphenoxylate -atropine  (LOMOTIL ) 2.5-0.025 MG per tablet 1 tablet (1 tablet Oral Given 10/22/23 1720)   sodium chloride  0.9 % bolus 1,000 mL (0 mLs IntraVENous Stopped 10/22/23 1938)   magnesium  sulfate 1000 mg in dextrose  5% 100 mL IVPB (0 mg  IntraVENous Stopped 10/22/23 1938)   potassium chloride  (KLOR-CON ) extended release tablet 40 mEq (40 mEq Oral Given 10/22/23 1838)       CONSULTS: See ED Course/MDM for further details.  None   PROCEDURES   Unless otherwise noted above, none  Procedures    SEPSIS REASSESSMENT & CRITICAL CARE TIME   SEPSIS REASSESSMENT: Patient does NOT meet Sepsis criteria after ED workup    Patient does not meet Critical Care Time, 0 minutes  CLINICAL IMPRESSIONS     1. Dyspnea, unspecified type    2. Hypokalemia    3. Gastroenteritis       SDOH/DISPOSITION/PLAN   Social Determinants affecting Treatment Plan: None    DISPOSITION Decision To Discharge 10/22/2023 09:49:53 PM   DISPOSITION CONDITION Stable         Discharge Note: The patient is stable for discharge home. The signs, symptoms, diagnosis, and discharge instructions have been discussed, understanding conveyed, and agreed upon. The patient is to follow up as recommended or return to ER should their symptoms worsen.      PATIENT REFERRED TO:  [facility]  [address]  [place]  [phone]  [phone]  Call in 2 days  As needed, If symptoms worsen        DISCHARGE MEDICATIONS:     Medication List        START taking these medications      ondansetron 4 MG disintegrating tablet  Commonly known as: ZOFRAN-ODT  Take 1 tablet by mouth 3 times daily as needed for Nausea or Vomiting            ASK your doctor about these medications      albuterol  sulfate HFA 108 (90 Base) MCG/ACT inhaler  Commonly known as: Proventil  HFA  Inhale 2 puffs into the lungs  every 4 hours as needed for Wheezing     aspirin  81 MG EC tablet  Take 1 tablet by mouth daily     atorvastatin  40 MG tablet  Commonly known as: LIPITOR  Take 1 tablet by mouth nightly     carvedilol  3.125 MG tablet  Commonly known as: COREG   TAKE 1 TABLET BY MOUTH 2 TIMES DAILY WITH A MEAL.     clopidogrel  75 MG tablet  Commonly known as: PLAVIX   Take 1 tablet by mouth daily      diclofenac sodium 1 % Gel  Commonly known as: VOLTAREN     empagliflozin 25 MG tablet  Commonly known as: JARDIANCE     furosemide  40 MG tablet  Commonly known as: LASIX   Take 1 tablet by mouth daily     ibuprofen 200 MG tablet  Commonly known as: ADVIL;MOTRIN     Lantus  SoloStar 100 UNIT/ML injection pen  Generic drug: insulin  glargine  Inject 30 Units into the skin daily     levothyroxine  175 MCG tablet  Commonly known as: SYNTHROID   Take 1 tablet by mouth Daily     linaCLOtide 72 MCG Caps capsule  Commonly known as: LINZESS     losartan  100 MG tablet  Commonly known as: COZAAR   Take 1 tablet by mouth daily     naproxen  500 MG tablet  Commonly known as: NAPROSYN   Take 1 tablet by mouth 2 times daily (with meals)     sertraline 100 MG tablet  Commonly known as: ZOLOFT     TYLENOL 500 MG tablet  Generic drug: acetaminophen               Where to Get Your Medications        These medications were sent to [facility] [address] [place] - [address] - P [phone] - F [phone]  [address], [place] [address]      Phone: [phone]   ondansetron 4 MG disintegrating tablet           DISCONTINUED MEDICATIONS:  Current Discharge Medication List          I am the Primary Clinician of Record. Joaquin Mulberry, MD (electronically signed)    (Please note that parts of this dictation were completed with voice recognition software. Quite often unanticipated grammatical, syntax, homophones, and other interpretive errors are inadvertently transcribed by the computer software. Please disregards these errors. Please excuse any errors that have escaped final proofreading.)     Margot Sheng, MD  10/22/23 2151

## 2023-10-23 LAB — POCT GLUCOSE
POC Glucose: 59 mg/dL — ABNORMAL LOW (ref 65–100)
POC Glucose: 59 mg/dL — ABNORMAL LOW (ref 65–100)
POC Glucose: 62 mg/dL — ABNORMAL LOW (ref 65–100)
POC Glucose: 73 mg/dL (ref 65–100)
POC Glucose: 123 mg/dL — ABNORMAL HIGH (ref 65–100)

## 2023-10-23 LAB — EKG 12-LEAD
Atrial Rate: 94 {beats}/min
P Axis: 43 degrees
Q-T Interval: 412 ms
QRS Duration: 150 ms
QTc Calculation (Bazett): 490 ms
R Axis: 260 degrees
T Axis: 44 degrees
Ventricular Rate: 85 {beats}/min

## 2023-10-23 MED ORDER — ONDANSETRON 4 MG PO TBDP
4 | ORAL_TABLET | Freq: Three times a day (TID) | ORAL | 0 refills | 7.00000 days | Status: DC | PRN
Start: 2023-10-23 — End: 2024-05-22

## 2023-10-23 NOTE — ED Notes (Signed)
 Spoke with transportation at [phone]; insurance states she is inactive in their system; received approval from [place], [employer] forces technical officer, for [employer].

## 2024-02-08 ENCOUNTER — Emergency Department: Admit: 2024-02-09 | Payer: Medicare (Managed Care)

## 2024-02-08 DIAGNOSIS — R0789 Other chest pain: Principal | ICD-10-CM

## 2024-02-08 NOTE — ED Triage Notes (Signed)
 Patient arrived to the ED cc of hyperglycemia, leg numbness, flank pain and chest pains x 3 months. Patient endorses leg numbness x 1 month. Patient states she has been off her DM medication for 3 months. Patient alert and awke.

## 2024-02-08 NOTE — ED Provider Notes (Signed)
 [facility]  EMERGENCY DEPARTMENT HISTORY AND PHYSICAL EXAM      Date of evaluation: 02/08/2024  Patient Name: Katrina Bowen 17-Jul-1960  MRN: [id number]  ED Provider: Powell CHRISTELLA Solar, MD   Note Started: 11:55 PM EDT 02/08/24    HISTORY OF PRESENT ILLNESS     Chief Complaint   Patient presents with    Chest Pain    Hyperglycemia       History Provided By: Patient, only     HPI: Laurelyn Terrero is a 63 y.o. female patient with multiple complaints.  Chest pain flank pain ongoing for 3 months.  Some shortness of breath.  Also elevated glucose.  History of heart failure history of MI with multiple stents in the past.    PAST MEDICAL HISTORY   Past Medical History:  Past Medical History:   Diagnosis Date    Arthritis     Asthma     CHF (congestive heart failure) (HCC)     Chronic pain     Diabetes mellitus (HCC)     Diverticulitis     Fibromyalgia     Heart attack (HCC)     X2    History of ear infections     pt stated multiple    Hyperlipidemia     Hypertension     Hypothyroid     Neuropathy        Past Surgical History:  Past Surgical History:   Procedure Laterality Date    CARDIAC CATHETERIZATION      x3 procedures; 7 stents    CESAREAN SECTION      COLONOSCOPY      COLONOSCOPY N/A 01/08/2022    COLONOSCOPY performed by Malvin Room, MD at [facility] ENDOSCOPY    EAR SURGERY      pt stated several sx    HYSTERECTOMY (CERVIX STATUS UNKNOWN)         Family History:  Family History   Problem Relation Age of Onset    No Known Problems Mother     No Known Problems Father        Social History:  Social History     Tobacco Use    Smoking status: Former     Types: Cigarettes    Smokeless tobacco: Never   Vaping Use    Vaping status: Never Used   Substance Use Topics    Alcohol use: Not Currently     Comment: occassional    Drug use: Yes     Types: Marijuana (Weed)       Allergies:  Allergies   Allergen Reactions    Latex Rash     Use paper tape only    Amitriptyline Anaphylaxis and Other (See Comments)     Muscle Cramps       Corticosteroids Anaphylaxis     ALL steroids.         Penicillins Anaphylaxis     Has patient had a PCN reaction causing immediate rash, facial/tongue/throat swelling, SOB or lightheadedness with hypotension: {Yes  Has patient had a PCN reaction causing severe rash involving mucus membranes or skin necrosis: NO  Has patient had a PCN reaction that required hospitalization NO  Has patient had a PCN reaction occurring within the last 10 years: {Yes  If all of the above answers are NO, then may proceed with Cephalosporin use.      Pregabalin Anaphylaxis, Shortness Of Breath and Swelling    Duloxetine Other (See Comments)  Other reaction(s): neurological reaction      Gabapentin Swelling       PCP: None, None    Current Meds:   Current Facility-Administered Medications   Medication Dose Route Frequency Provider Last Rate Last Admin    sodium chloride  0.9 % bolus 500 mL  500 mL IntraVENous Once Jabri Blancett, Powell HERO, MD        insulin  regular (HumuLIN  R;NovoLIN  R) injection 10 Units  10 Units IntraVENous Once Neven Fina, Powell HERO, MD         Current Outpatient Medications   Medication Sig Dispense Refill    ondansetron  (ZOFRAN -ODT) 4 MG disintegrating tablet Take 1 tablet by mouth 3 times daily as needed for Nausea or Vomiting 21 tablet 0    aspirin  81 MG EC tablet Take 1 tablet by mouth daily 30 tablet 0    atorvastatin  (LIPITOR ) 40 MG tablet Take 1 tablet by mouth nightly 30 tablet 0    carvedilol  (COREG ) 3.125 MG tablet TAKE 1 TABLET BY MOUTH 2 TIMES DAILY WITH A MEAL. 60 tablet 0    clopidogrel  (PLAVIX ) 75 MG tablet Take 1 tablet by mouth daily 30 tablet 0    furosemide  (LASIX ) 40 MG tablet Take 1 tablet by mouth daily 60 tablet 0    levothyroxine  (SYNTHROID ) 175 MCG tablet Take 1 tablet by mouth Daily 30 tablet 0    losartan  (COZAAR ) 100 MG tablet Take 1 tablet by mouth daily 30 tablet 0    albuterol  sulfate HFA (PROVENTIL  HFA) 108 (90 Base) MCG/ACT inhaler Inhale 2 puffs into the lungs every 4 hours as needed for  Wheezing 18 g 2    LANTUS  SOLOSTAR 100 UNIT/ML injection pen Inject 30 Units into the skin daily 5 Adjustable Dose Pre-filled Pen Syringe 0    naproxen  (NAPROSYN ) 500 MG tablet Take 1 tablet by mouth 2 times daily (with meals) 20 tablet 0    diclofenac sodium (VOLTAREN) 1 % GEL Apply 2 g topically 4 times daily      empagliflozin (JARDIANCE) 25 MG tablet Take 1 tablet by mouth daily      sertraline  (ZOLOFT ) 100 MG tablet Take 1 tablet by mouth daily      linaCLOtide (LINZESS) 72 MCG CAPS capsule Take 1 capsule by mouth every morning (before breakfast)      ibuprofen  (ADVIL ;MOTRIN ) 200 MG tablet Take 2 tablets by mouth every 6 hours as needed      acetaminophen  (TYLENOL ) 500 MG tablet Take 2 tablets by mouth every 6 hours as needed for Pain         Social Determinants of Health:   Social Drivers of Health     Tobacco Use: Medium Risk (10/22/2023)    Patient History     Smoking Tobacco Use: Former     Smokeless Tobacco Use: Never     Passive Exposure: Not on file   Alcohol Use: Not At Risk (02/08/2024)    AUDIT-C     Frequency of Alcohol Consumption: Never     Average Number of Drinks: Patient does not drink     Frequency of Binge Drinking: Never   Physicist, medical Strain: Not on file   Food Insecurity: Not on file   Transportation Needs: Not on file   Physical Activity: Not on file   Stress: Not on file   Social Connections: Not on file   Intimate Partner Violence: Not on file   Depression: Not on file   Housing Stability: Not on file   Interpersonal  Safety: Not At Risk (01/22/2023)    Interpersonal Safety Domain Source: IP Abuse Screening     Physical abuse: Denies     Verbal abuse: Denies     Emotional abuse: Denies     Financial abuse: Denies     Sexual abuse: Denies   Utilities: Not on file     PHYSICAL EXAM   Physical Exam  Constitutional:       General: She is in acute distress.      Appearance: Normal appearance. She is not ill-appearing.   HENT:      Head: Normocephalic and atraumatic.      Right Ear: External  ear normal.      Left Ear: External ear normal.      Nose: Nose normal. No congestion or rhinorrhea.      Mouth/Throat:      Mouth: Mucous membranes are moist.   Eyes:      Extraocular Movements: Extraocular movements intact.      Conjunctiva/sclera: Conjunctivae normal.      Pupils: Pupils are equal, round, and reactive to light.   Cardiovascular:      Rate and Rhythm: Normal rate and regular rhythm.      Pulses: Normal pulses.      Heart sounds: Normal heart sounds.   Pulmonary:      Effort: Pulmonary effort is normal. No respiratory distress.      Breath sounds: Normal breath sounds.   Abdominal:      General: Abdomen is flat. There is no distension.      Tenderness: There is no abdominal tenderness.   Musculoskeletal:         General: No swelling or deformity. Normal range of motion.      Cervical back: Normal range of motion.   Skin:     General: Skin is warm.      Coloration: Skin is not jaundiced or pale.   Neurological:      General: No focal deficit present.      Mental Status: She is alert and oriented to person, place, and time. Mental status is at baseline.   Psychiatric:         Mood and Affect: Mood normal.         Behavior: Behavior normal.         Thought Content: Thought content normal.         Judgment: Judgment normal.         SCREENINGS                No data recorded       LAB, EKG AND DIAGNOSTIC RESULTS   Labs:  Recent Results (from the past 12 hours)   POCT Glucose    Collection Time: 02/08/24 11:33 PM   Result Value Ref Range    POC Glucose >600 (HH) 65 - 100 mg/dL    Performed by: Delores Breaker    CBC with Auto Differential    Collection Time: 02/08/24 11:44 PM   Result Value Ref Range    WBC 7.5 3.6 - 11.0 K/uL    RBC 3.99 3.80 - 5.20 M/uL    Hemoglobin 11.9 11.5 - 16.0 g/dL    Hematocrit 65.3 (L) 35.0 - 47.0 %    MCV 86.7 80.0 - 99.0 FL    MCH 29.8 26.0 - 34.0 PG    MCHC 34.4 30.0 - 36.5 g/dL    RDW 86.9 88.4 - 85.4 %    Platelets 139 (L)  150 - 400 K/uL    MPV 10.8 8.9 - 12.9 FL     Nucleated RBCs 0.0 0.0 PER 100 WBC    nRBC 0.00 0.00 - 0.01 K/uL    Neutrophils % 62.9 32.0 - 75.0 %    Lymphocytes % 28.9 12.0 - 49.0 %    Monocytes % 4.7 (L) 5.0 - 13.0 %    Eosinophils % 1.5 0.0 - 7.0 %    Basophils % 0.9 0.0 - 1.0 %    Immature Granulocytes % 1.1 (H) 0 - 0.5 %    Neutrophils Absolute 4.73 1.80 - 8.00 K/UL    Lymphocytes Absolute 2.17 0.80 - 3.50 K/UL    Monocytes Absolute 0.35 0.00 - 1.00 K/UL    Eosinophils Absolute 0.11 0.00 - 0.40 K/UL    Basophils Absolute 0.07 0.00 - 0.10 K/UL    Immature Granulocytes Absolute 0.08 (H) 0.00 - 0.04 K/UL    Differential Type AUTOMATED     Comprehensive Metabolic Panel    Collection Time: 02/08/24 11:44 PM   Result Value Ref Range    Sodium 133 (L) 136 - 145 mmol/L    Potassium 3.6 3.5 - 5.1 mmol/L    Chloride 100 97 - 108 mmol/L    CO2 23 21 - 32 mmol/L    Anion Gap 10 2 - 12 mmol/L    Glucose 658 (HH) 65 - 100 mg/dL    BUN 11 6 - 20 mg/dL    Creatinine 9.13 9.44 - 1.02 mg/dL    BUN/Creatinine Ratio 13 12 - 20      Est, Glom Filt Rate 76 >60 ml/min/1.8m2    Calcium  8.1 (L) 8.5 - 10.1 mg/dL    Total Bilirubin 0.4 0.2 - 1.0 mg/dL    AST 21 15 - 37 U/L    ALT 25 12 - 78 U/L    Alk Phosphatase 64 45 - 117 U/L    Total Protein 5.5 (L) 6.4 - 8.2 g/dL    Albumin 3.1 (L) 3.5 - 5.0 g/dL    Globulin 2.4 2.0 - 4.0 g/dL    Albumin/Globulin Ratio 1.3 1.1 - 2.2     Troponin    Collection Time: 02/08/24 11:44 PM   Result Value Ref Range    Troponin, High Sensitivity 5 0 - 51 ng/L   Brain Natriuretic Peptide    Collection Time: 02/08/24 11:44 PM   Result Value Ref Range    NT Pro-BNP 96 <125 pg/mL   POCT Glucose    Collection Time: 02/09/24  1:07 AM   Result Value Ref Range    POC Glucose 571 (H) 65 - 100 mg/dL    Performed by: Delores Breaker    Urinalysis    Collection Time: 02/09/24  1:15 AM   Result Value Ref Range    Color, UA Yellow/Straw      Appearance Clear Clear      Specific Gravity, UA >1.030 (H) 1.003 - 1.030    pH, Urine 5.0 5.0 - 8.0      Protein, UA Negative  Negative mg/dL    Glucose, Ur >499 (A) Negative mg/dL    Ketones, Urine Negative Negative mg/dL    Bilirubin, Urine Negative Negative      Blood, Urine Negative Negative      Urobilinogen, Urine 0.1 0.1 - 1.0 EU/dL    Nitrite, Urine Negative Negative      Leukocyte Esterase, Urine Negative Negative     Venous Blood Gas, POC  Collection Time: 02/09/24  1:37 AM   Result Value Ref Range    PH, VENOUS (POC) 7.43 7.23 - 7.44      PCO2, Venus, POC 46.0 41.0 - 52.0 mmHg    PO2, VENOUS (POC) <27 25 - 40 mmHg    HCO3, Venous 30.6 (H) 22.0 - 26.0 mmol/L    BASE EXCESS, VENOUS (POC) 5.5 mmol/L    Specimen type: Venous      Performed by: Delores Breaker    POCT Glucose    Collection Time: 02/09/24  3:14 AM   Result Value Ref Range    POC Glucose 419 (H) 65 - 100 mg/dL    Performed by: White Jakiya    POCT Glucose    Collection Time: 02/09/24  3:45 AM   Result Value Ref Range    POC Glucose 439 (H) 65 - 100 mg/dL    Performed by: Lowry Jacobsen    POCT Glucose    Collection Time: 02/09/24  4:32 AM   Result Value Ref Range    POC Glucose 355 (H) 65 - 100 mg/dL    Performed by: Lowry Jacobsen    POCT Glucose    Collection Time: 02/09/24  5:39 AM   Result Value Ref Range    POC Glucose 297 (H) 65 - 100 mg/dL    Performed by: Lowry Jacobsen        EKG:.See ED Course Below    Radiologic Studies:  Radiographic images are visualized and preliminarily interpreted by the ED Provider with the following findings: See ED Course Below.     Interpretation per the Radiologist below, if available at the time of this note:  XR CHEST 1 VIEW   Final Result   No acute intrathoracic process is identified.             Electronically signed by JIMMY RABON           Records Reviewed: Not Applicable    MEDICAL DECISION MAKING and ED COURSE   5:41 AM Differential and Considerations of tests not ordered: chest pain for months, plan trop, eval for dka vs hhs    HEART SCORE:     History (slight suspicious 0, moderate +1, highly suspicious +2)  0  EKG  (normal 0, nonspecific repol changes +1, ST changes +2)  0  Age (<45 y 0, 45-64y +1, >65y +2)  1  Risk Factors HTN, XOL, DM, obesity, smoker, CAD, PAD (none 0, 1-2 factors +1, >3 +2 factors) 2  Troponin (normal 0, 1-3x limit +1, >3x limit +2)  0    Heart Score 3        Management   Scores 0-3: 0.9-1.7% risk of adverse cardiac event. In the HEART Score study, these patients were discharged (0.99% in the retrospective study, 1.7% in the prospective study)   Scores 4-6: 12-16.6% risk of adverse cardiac event. In the HEART Score study, these patients were admitted to the hospital. (11.6% retrospective, 16.6% prospective)   Scores >=7: 50-65% risk of adverse cardiac event. In the HEART Score study, these patients were candidates for early invasive measures. (65.2% retrospective, 50.1% prospective)   A MACE (Major Adverse Cardiac Event) was defined as all-cause mortality, myocardial infarction, or coronary revascularization.    Original/Primary Reference  Six AJ, Backus BE, Kelder JC. Chest pain in the emergency room: value of the HEART score. Neth Heart J. 2008;16(6):191-6.   Validation  Backus BE, Six AJ, Kelder JC, et al. A prospective validation of the HEART  score for chest pain patients at the emergency department. Int J Cardiol. 2013;168(3):2153-8.   Backus BE, Six AJ, Joice SPEAK, et al. Chest pain in the emergency room: a multicenter validation of the HEART Score. Crit Pathw Cardiol. 2010;9(3):164-9.  Stopyra JP, Riley RF, Hiestand BC, et al. The HEART Pathway Randomized Controlled Trial One Year Outcomes. Acad Emerg Med. 2018;          Vitals:    Vitals:    02/09/24 0130 02/09/24 0300 02/09/24 0405 02/09/24 0406   BP:  120/64 (!) 115/59    Pulse: 84 79 85    Resp: 16 13  20    Temp:       TempSrc:       SpO2: 98% 99% 100%    Weight:       Height:           ED COURSE  ED Course as of 02/09/24 0541   Sun Feb 08, 2024   2344 EKG at 2329 normal sinus rhythm rate of 85 right bundle branch block no ST changes reason not  ACS.  Interpreted independently by ER physician. [HP]   Mon Feb 09, 2024   0030 XR CHEST 1 VIEW  FINDINGS:  AP portable upright view of the chest demonstrates a stable  cardiopericardial  silhouette.There is no pleural effusion.There is no focal consolidation.There is  no pneumothorax. Patient is on a cardiac monitor.     IMPRESSION:  No acute intrathoracic process is identified.            Electronically signed by JIMMY HABIB    Chest x-ray interpreted independently by ER physician.  No evidence of pneumothorax, rib fracture, pleural effusion pneumonia or dissection.  Negative acute.   [HP]   0030 CBC with Auto Differential(!):    WBC 7.5   RBC 3.99   Hemoglobin Quant 11.9   Hematocrit 34.6(!)   MCV 86.7   MCH 29.8   MCHC 34.4   RDW 13.0   Platelet Count 139(!)   MPV 10.8   Nucleated Red Blood Cells 0.0   Nucleated Red Blood Cells 0.00   Neutrophils % 62.9   Lymphocyte % 28.9   Monocytes % 4.7(!)   Eosinophils % 1.5   Basophils % 0.9   Immature Granulocytes % 1.1(!)   Neutrophils Absolute 4.73   Lymphocytes Absolute 2.17   Monocytes Absolute 0.35   Eosinophils Absolute 0.11   Basophils Absolute 0.07   Immature Granulocytes Absolute 0.08(!)   Differential Type AUTOMATED  Lab work interpreted independently by ER physician as NORMAL   [HP]   0030 Troponin, High Sensitivity: 5  Lab work interpreted independently by ER physician as NORMAL   [HP]   0030 POC Glucose(!!): >600  hyperGlycemia [HP]   0043 Comprehensive Metabolic Panel(!):    Sodium 133(!)   Potassium 3.6   Chloride 100   CARBON DIOXIDE 23   Anion Gap 10   Glucose PENDING   BUN,BUNPL 11   Creatinine 0.86   Bun/Cre 13   Est, Glom Filt Rate 76   Calcium  8.1(!)   Total Bilirubin 0.4   AST 21   ALT 25   Alkaline Phosphatase 64   Total Protein 5.5(!)   Albumin 3.1(!)   Globulin 2.4   Albumin/Globulin Ratio 1.3  No e/o DKA [HP]   0043 NT Pro-BNP: 96  No e/o CHF [HP]   0154 POC Glucose(!): 571  Still elevated [HP]   0248 PH, VENOUS (POC): 7.43  No dka [  HP]   0318 POC  Glucose(!): 419  Improving. [HP]   0436 POC Glucose(!): 355  Imprroving. IVF and insuin ordered [HP]   0541 POC Glucose(!): 297 [HP]   0541 Dc as BG <300 [HP]      ED Course User Index  [HP] Jahniya Duzan, Powell HERO, MD       Clinical Management Tools:  Not Applicable, Chest Pain: MEDICAL DECISION MAKING:   I considered, but did not perform, additional testing such CT Angiogram, as well as admission or transfer to a higher level of care.     I utilized an evidence-based risk rating tool (CMT) along with my training and experience to weigh the risk of discharge against the risks of further testing, imaging, or hospitalization. At this time, I estimate the risks of additional testing, imaging, or hospitalization to be equal to or greater than the risk of discharge(in the case of discharge home).      The patient's HEART Score is 3. In rare cases, I give patients with HEART Score of 4 the option of discharge, but only when they meet criteria for Low 4, meaning that HST was used, and the 4 is not from a highly suspicious story, highly suspicious EKG, or positive cardiac enzymes.  In these selected cases, the risk of a Low 4 is still most likely lower than the risk of admission and further testing/imaging. [id number]    SHARED DECISION MAKING:   I discussed my risk assessment with the patient. The patient understands and consents to the risk of disposition/plan, as well as the risk of uncertainty in estimating outcomes. [id number]          Smoking Cessation: Not Applicable    Patient was given the following medications:  Medications   sodium chloride  0.9 % bolus 500 mL (has no administration in time range)   insulin  regular (HumuLIN  R;NovoLIN  R) injection 10 Units (has no administration in time range)   insulin  regular (HumuLIN  R;NovoLIN  R) injection 10 Units (10 Units IntraVENous Given 02/09/24 0127)   aspirin  tablet 325 mg (325 mg Oral Given 02/09/24 0118)   sodium chloride  0.9 % bolus 500 mL (0 mLs  IntraVENous Stopped 02/09/24 0343)   insulin  regular (HumuLIN  R;NovoLIN  R) injection 10 Units (10 Units IntraVENous Given 02/09/24 0359)   morphine  (PF) injection 4 mg (4 mg IntraVENous Given 02/09/24 0345)   ondansetron  (ZOFRAN ) injection 4 mg (4 mg IntraVENous Given 02/09/24 0345)       CONSULTS: See ED Course/MDM for further details.  None     PROCEDURES   Unless otherwise noted above, none  Procedures      SEPSIS REASSESSMENT & CRITICAL CARE TIME   SEPSIS REASSESSMENT: Patient does NOT meet Sepsis criteria after ED workup    Critical Care Time: There was a high probability of life-threatening clinical deterioration in the patient's condition requiring my urgent intervention.  I personally saw the patient and independently provided 30 minutes of non-concurrent critical care out of the total shared critical care time provided, excluding separately reportable procedures.   CLINICAL IMPRESSIONS     1. Atypical chest pain    2. Hyperglycemia       SDOH/DISPOSITION/PLAN   Social Determinants affecting Treatment Plan: None    DISPOSITION Decision To Discharge 02/09/2024 05:41:17 AM   DISPOSITION CONDITION Stable         Discharge Note: The patient is stable for discharge home. The signs, symptoms, diagnosis, and discharge instructions have been discussed, understanding conveyed, and agreed  upon. The patient is to follow up as recommended or return to ER should their symptoms worsen.      PATIENT REFERRED TO:  Jannifer Medford FERNS, MD  [address] [place] [address]  [phone]              DISCHARGE MEDICATIONS:     Medication List        ASK your doctor about these medications      albuterol  sulfate HFA 108 (90 Base) MCG/ACT inhaler  Commonly known as: Proventil  HFA  Inhale 2 puffs into the lungs every 4 hours as needed for Wheezing     aspirin  81 MG EC tablet  Take 1 tablet by mouth daily     atorvastatin  40 MG tablet  Commonly known as: LIPITOR   Take 1 tablet by mouth nightly     carvedilol  3.125 MG  tablet  Commonly known as: COREG   TAKE 1 TABLET BY MOUTH 2 TIMES DAILY WITH A MEAL.     clopidogrel  75 MG tablet  Commonly known as: PLAVIX   Take 1 tablet by mouth daily     diclofenac sodium 1 % Gel  Commonly known as: VOLTAREN     empagliflozin 25 MG tablet  Commonly known as: JARDIANCE     furosemide  40 MG tablet  Commonly known as: LASIX   Take 1 tablet by mouth daily     ibuprofen  200 MG tablet  Commonly known as: ADVIL ;MOTRIN      Lantus  SoloStar 100 UNIT/ML injection pen  Generic drug: insulin  glargine  Inject 30 Units into the skin daily     levothyroxine  175 MCG tablet  Commonly known as: SYNTHROID   Take 1 tablet by mouth Daily     linaCLOtide 72 MCG Caps capsule  Commonly known as: LINZESS     losartan  100 MG tablet  Commonly known as: COZAAR   Take 1 tablet by mouth daily     naproxen  500 MG tablet  Commonly known as: NAPROSYN   Take 1 tablet by mouth 2 times daily (with meals)     ondansetron  4 MG disintegrating tablet  Commonly known as: ZOFRAN -ODT  Take 1 tablet by mouth 3 times daily as needed for Nausea or Vomiting     sertraline  100 MG tablet  Commonly known as: ZOLOFT      TYLENOL  500 MG tablet  Generic drug: acetaminophen                 DISCONTINUED MEDICATIONS:  Current Discharge Medication List          I am the Primary Clinician of Record. Powell CHRISTELLA Solar, MD (electronically signed)    (Please note that parts of this dictation were completed with voice recognition software. Quite often unanticipated grammatical, syntax, homophones, and other interpretive errors are inadvertently transcribed by the computer software. Please disregards these errors. Please excuse any errors that have escaped final proofreading.)        Solar Powell CHRISTELLA, MD  02/09/24 [phone]

## 2024-02-09 ENCOUNTER — Inpatient Hospital Stay
Admit: 2024-02-09 | Discharge: 2024-02-09 | Disposition: A | Discharge status: Home / Self Care | Payer: Medicare (Managed Care) | Arrived: AM | Attending: Emergency Medicine

## 2024-02-09 LAB — URINALYSIS
Bilirubin, Urine: NEGATIVE
Blood, Urine: NEGATIVE
Glucose, Ur: >500 mg/dL — AB
Ketones, Urine: NEGATIVE mg/dL
Leukocyte Esterase, Urine: NEGATIVE
Nitrite, Urine: NEGATIVE
Protein, UA: NEGATIVE mg/dL
Specific Gravity, UA: >1.03 — ABNORMAL HIGH (ref 1.003–1.030)
Urobilinogen, Urine: 0.1 EU/dL (ref 0.1–1.0)
pH, Urine: 5 (ref 5.0–8.0)

## 2024-02-09 LAB — COMPREHENSIVE METABOLIC PANEL
ALT: 25 U/L (ref 12–78)
AST: 21 U/L (ref 15–37)
Albumin/Globulin Ratio: 1.3 (ref 1.1–2.2)
Albumin: 3.1 g/dL — ABNORMAL LOW (ref 3.5–5.0)
Alk Phosphatase: 64 U/L (ref 45–117)
Anion Gap: 10 mmol/L (ref 2–12)
BUN/Creatinine Ratio: 13 (ref 12–20)
BUN: 11 mg/dL (ref 6–20)
CO2: 23 mmol/L (ref 21–32)
Calcium: 8.1 mg/dL — ABNORMAL LOW (ref 8.5–10.1)
Chloride: 100 mmol/L (ref 97–108)
Creatinine: 0.86 mg/dL (ref 0.55–1.02)
Est, Glom Filt Rate: 76 ml/min/1.73m2 (ref >60)
Globulin: 2.4 g/dL (ref 2.0–4.0)
Glucose: 658 mg/dL (ref 65–100)
Potassium: 3.6 mmol/L (ref 3.5–5.1)
Sodium: 133 mmol/L — ABNORMAL LOW (ref 136–145)
Total Bilirubin: 0.4 mg/dL (ref 0.2–1.0)
Total Protein: 5.5 g/dL — ABNORMAL LOW (ref 6.4–8.2)

## 2024-02-09 LAB — CBC WITH AUTO DIFFERENTIAL
Basophils %: 0.9 % (ref 0.0–1.0)
Basophils Absolute: 0.07 K/UL (ref 0.00–0.10)
Eosinophils %: 1.5 % (ref 0.0–7.0)
Eosinophils Absolute: 0.11 K/UL (ref 0.00–0.40)
Hematocrit: 34.6 % — ABNORMAL LOW (ref 35.0–47.0)
Hemoglobin: 11.9 g/dL (ref 11.5–16.0)
Immature Granulocytes %: 1.1 % — ABNORMAL HIGH (ref 0–0.5)
Immature Granulocytes Absolute: 0.08 K/UL — ABNORMAL HIGH (ref 0.00–0.04)
Lymphocytes %: 28.9 % (ref 12.0–49.0)
Lymphocytes Absolute: 2.17 K/UL (ref 0.80–3.50)
MCH: 29.8 pg (ref 26.0–34.0)
MCHC: 34.4 g/dL (ref 30.0–36.5)
MCV: 86.7 FL (ref 80.0–99.0)
MPV: 10.8 FL (ref 8.9–12.9)
Monocytes %: 4.7 % — ABNORMAL LOW (ref 5.0–13.0)
Monocytes Absolute: 0.35 K/UL (ref 0.00–1.00)
Neutrophils %: 62.9 % (ref 32.0–75.0)
Neutrophils Absolute: 4.73 K/UL (ref 1.80–8.00)
Nucleated RBCs: 0 /100{WBCs}
Platelets: 139 K/uL — ABNORMAL LOW (ref 150–400)
RBC: 3.99 M/uL (ref 3.80–5.20)
RDW: 13 % (ref 11.5–14.5)
WBC: 7.5 K/uL (ref 3.6–11.0)
nRBC: 0 K/uL (ref 0.00–0.01)

## 2024-02-09 LAB — POCT GLUCOSE
POC Glucose: >600 mg/dL (ref 65–100)
POC Glucose: 571 mg/dL — ABNORMAL HIGH (ref 65–100)
POC Glucose: 419 mg/dL — ABNORMAL HIGH (ref 65–100)
POC Glucose: 439 mg/dL — ABNORMAL HIGH (ref 65–100)
POC Glucose: 355 mg/dL — ABNORMAL HIGH (ref 65–100)
POC Glucose: 297 mg/dL — ABNORMAL HIGH (ref 65–100)

## 2024-02-09 LAB — VENOUS BLOOD GAS, POINT OF CARE
BASE EXCESS, VENOUS (POC): 5.5 mmol/L
HCO3, Venous: 30.6 mmol/L — ABNORMAL HIGH (ref 22.0–26.0)
PCO2, Venus, POC: 46 mmHg (ref 41.0–52.0)
PH, VENOUS (POC): 7.43 (ref 7.23–7.44)
PO2, VENOUS (POC): <27 mmHg (ref 25–40)

## 2024-02-09 LAB — EKG 12-LEAD
Atrial Rate: 85 {beats}/min
P Axis: 90 degrees
P-R Interval: 154 ms
Q-T Interval: 424 ms
QRS Duration: 144 ms
QTc Calculation (Bazett): 504 ms
R Axis: 266 degrees
T Axis: 47 degrees
Ventricular Rate: 85 {beats}/min

## 2024-02-09 LAB — TROPONIN: Troponin, High Sensitivity: 5 ng/L (ref 0–51)

## 2024-02-09 LAB — BRAIN NATRIURETIC PEPTIDE: NT Pro-BNP: 96 pg/mL (ref <125)

## 2024-02-09 MED ORDER — ONDANSETRON HCL 4 MG/2ML IJ SOLN
4 | Freq: Once | INTRAMUSCULAR | Status: AC
Start: 2024-02-09 — End: 2024-02-09
  Administered 2024-02-09: 08:00:00 4 mg via INTRAVENOUS

## 2024-02-09 MED ORDER — INSULIN REGULAR HUMAN 100 UNIT/ML IJ SOLN
100 | Freq: Once | INTRAMUSCULAR | Status: DC
Start: 2024-02-09 — End: 2024-02-09

## 2024-02-09 MED ORDER — SODIUM CHLORIDE 0.9 % IV BOLUS
0.9 | Freq: Once | INTRAVENOUS | Status: DC
Start: 2024-02-09 — End: 2024-02-09

## 2024-02-09 MED ORDER — SODIUM CHLORIDE 0.9 % IV BOLUS
0.9 | Freq: Once | INTRAVENOUS | Status: AC
Start: 2024-02-09 — End: 2024-02-09
  Administered 2024-02-09: 07:00:00 500 mL via INTRAVENOUS

## 2024-02-09 MED ORDER — INSULIN REGULAR HUMAN 100 UNIT/ML IJ SOLN
100 | Freq: Once | INTRAMUSCULAR | Status: AC
Start: 2024-02-09 — End: 2024-02-09
  Administered 2024-02-09: 08:00:00 10 [IU] via INTRAVENOUS

## 2024-02-09 MED ORDER — INSULIN REGULAR HUMAN 100 UNIT/ML IJ SOLN
100 | Freq: Once | INTRAMUSCULAR | Status: AC
Start: 2024-02-09 — End: 2024-02-09
  Administered 2024-02-09: 05:00:00 10 [IU] via INTRAVENOUS

## 2024-02-09 MED ORDER — MORPHINE SULFATE (PF) 4 MG/ML IV SOLN
4 | INTRAVENOUS | Status: AC
Start: 2024-02-09 — End: 2024-02-09
  Administered 2024-02-09: 08:00:00 4 mg via INTRAVENOUS

## 2024-02-09 MED ORDER — ASPIRIN 325 MG PO TABS
325 | ORAL | Status: AC
Start: 2024-02-09 — End: 2024-02-09
  Administered 2024-02-09: 05:00:00 325 mg via ORAL

## 2024-02-09 MED FILL — HUMULIN R 100 UNIT/ML IJ SOLN: 100 [IU]/mL | INTRAMUSCULAR | Qty: 10 | Fill #0

## 2024-02-09 MED FILL — SODIUM CHLORIDE 0.9 % IV SOLN: 0.9 % | INTRAVENOUS | Qty: 500 | Fill #0

## 2024-02-09 MED FILL — MORPHINE SULFATE 4 MG/ML IV SOLN: 4 mg/mL | INTRAVENOUS | Qty: 1 | Fill #0

## 2024-02-09 MED FILL — ASPIRIN 325 MG PO TABS: 325 mg | ORAL | Qty: 1 | Fill #0

## 2024-02-09 MED FILL — ONDANSETRON HCL 4 MG/2ML IJ SOLN: 4 MG/2ML | INTRAMUSCULAR | Qty: 2 | Fill #0

## 2024-02-09 NOTE — Discharge Instructions (Signed)
 Thank you for choosing our Emergency Department for your care.  It is our privilege to care for you in your time of need.  In the next several days, you may receive a survey via email or mailed to your home about your experience with our team.  We would greatly appreciate you taking a few minutes to complete the survey, as we use this information to learn what we have done well and what we could be doing better. Thank you for trusting us  with your care!    Below you will find a list of your tests from today's visit.   Labs and Radiology Studies  Recent Results (from the past 12 hours)   POCT Glucose    Collection Time: 02/08/24 11:33 PM   Result Value Ref Range    POC Glucose >600 (HH) 65 - 100 mg/dL    Performed by: Delores Breaker    CBC with Auto Differential    Collection Time: 02/08/24 11:44 PM   Result Value Ref Range    WBC 7.5 3.6 - 11.0 K/uL    RBC 3.99 3.80 - 5.20 M/uL    Hemoglobin 11.9 11.5 - 16.0 g/dL    Hematocrit 65.3 (L) 35.0 - 47.0 %    MCV 86.7 80.0 - 99.0 FL    MCH 29.8 26.0 - 34.0 PG    MCHC 34.4 30.0 - 36.5 g/dL    RDW 86.9 88.4 - 85.4 %    Platelets 139 (L) 150 - 400 K/uL    MPV 10.8 8.9 - 12.9 FL    Nucleated RBCs 0.0 0.0 PER 100 WBC    nRBC 0.00 0.00 - 0.01 K/uL    Neutrophils % 62.9 32.0 - 75.0 %    Lymphocytes % 28.9 12.0 - 49.0 %    Monocytes % 4.7 (L) 5.0 - 13.0 %    Eosinophils % 1.5 0.0 - 7.0 %    Basophils % 0.9 0.0 - 1.0 %    Immature Granulocytes % 1.1 (H) 0 - 0.5 %    Neutrophils Absolute 4.73 1.80 - 8.00 K/UL    Lymphocytes Absolute 2.17 0.80 - 3.50 K/UL    Monocytes Absolute 0.35 0.00 - 1.00 K/UL    Eosinophils Absolute 0.11 0.00 - 0.40 K/UL    Basophils Absolute 0.07 0.00 - 0.10 K/UL    Immature Granulocytes Absolute 0.08 (H) 0.00 - 0.04 K/UL    Differential Type AUTOMATED     Comprehensive Metabolic Panel    Collection Time: 02/08/24 11:44 PM   Result Value Ref Range    Sodium 133 (L) 136 - 145 mmol/L    Potassium 3.6 3.5 - 5.1 mmol/L    Chloride 100 97 - 108 mmol/L    CO2  23 21 - 32 mmol/L    Anion Gap 10 2 - 12 mmol/L    Glucose 658 (HH) 65 - 100 mg/dL    BUN 11 6 - 20 mg/dL    Creatinine 9.13 9.44 - 1.02 mg/dL    BUN/Creatinine Ratio 13 12 - 20      Est, Glom Filt Rate 76 >60 ml/min/1.60m2    Calcium  8.1 (L) 8.5 - 10.1 mg/dL    Total Bilirubin 0.4 0.2 - 1.0 mg/dL    AST 21 15 - 37 U/L    ALT 25 12 - 78 U/L    Alk Phosphatase 64 45 - 117 U/L    Total Protein 5.5 (L) 6.4 - 8.2 g/dL    Albumin 3.1 (L)  3.5 - 5.0 g/dL    Globulin 2.4 2.0 - 4.0 g/dL    Albumin/Globulin Ratio 1.3 1.1 - 2.2     Troponin    Collection Time: 02/08/24 11:44 PM   Result Value Ref Range    Troponin, High Sensitivity 5 0 - 51 ng/L   Brain Natriuretic Peptide    Collection Time: 02/08/24 11:44 PM   Result Value Ref Range    NT Pro-BNP 96 <125 pg/mL   POCT Glucose    Collection Time: 02/09/24  1:07 AM   Result Value Ref Range    POC Glucose 571 (H) 65 - 100 mg/dL    Performed by: Delores Breaker    Urinalysis    Collection Time: 02/09/24  1:15 AM   Result Value Ref Range    Color, UA Yellow/Straw      Appearance Clear Clear      Specific Gravity, UA >1.030 (H) 1.003 - 1.030    pH, Urine 5.0 5.0 - 8.0      Protein, UA Negative Negative mg/dL    Glucose, Ur >499 (A) Negative mg/dL    Ketones, Urine Negative Negative mg/dL    Bilirubin, Urine Negative Negative      Blood, Urine Negative Negative      Urobilinogen, Urine 0.1 0.1 - 1.0 EU/dL    Nitrite, Urine Negative Negative      Leukocyte Esterase, Urine Negative Negative     Venous Blood Gas, POC    Collection Time: 02/09/24  1:37 AM   Result Value Ref Range    PH, VENOUS (POC) 7.43 7.23 - 7.44      PCO2, Venus, POC 46.0 41.0 - 52.0 mmHg    PO2, VENOUS (POC) <27 25 - 40 mmHg    HCO3, Venous 30.6 (H) 22.0 - 26.0 mmol/L    BASE EXCESS, VENOUS (POC) 5.5 mmol/L    Specimen type: Venous      Performed by: Delores Breaker    POCT Glucose    Collection Time: 02/09/24  3:14 AM   Result Value Ref Range    POC Glucose 419 (H) 65 - 100 mg/dL    Performed by: White Jakiya     POCT Glucose    Collection Time: 02/09/24  3:45 AM   Result Value Ref Range    POC Glucose 439 (H) 65 - 100 mg/dL    Performed by: Lowry Jacobsen    POCT Glucose    Collection Time: 02/09/24  4:32 AM   Result Value Ref Range    POC Glucose 355 (H) 65 - 100 mg/dL    Performed by: Lowry Jacobsen      XR CHEST 1 VIEW  Result Date: 02/09/2024  INDICATION:   Chest Pain COMPARISON: 10/22/2023 FINDINGS: AP portable upright view of the chest demonstrates a stable  cardiopericardial silhouette.There is no pleural effusion.There is no focal consolidation.There is no pneumothorax. Patient is on a cardiac monitor.     No acute intrathoracic process is identified. Electronically signed by JIMMY HABIB    ------------------------------------------------------------------------------------------------------------  The evaluation and treatment you received in the Emergency Department were for an urgent problem. It is important that you follow-up with a doctor, nurse practitioner, or physician assistant to:  (1) confirm your diagnosis,  (2) re-evaluation of changes in your illness and treatment, and (3) for ongoing care. Please take your discharge instructions with you when you go to your follow-up appointment.     If you have any problem arranging a follow-up appointment, contact  us !  If your symptoms become worse or you do not improve as expected, please return to us . We are available 24 hours a day.     If a prescription has been provided, please fill it as soon as possible to prevent a delay in treatment. If you have any questions or reservations about taking the medication due to side effects or interactions with other medications, please call your primary care provider or contact us  directly.  Again, THANK YOU for choosing us  to care for YOU!

## 2024-02-09 NOTE — ED Notes (Signed)
 Report given to Mardella Layman, Charity fundraiser.

## 2024-02-11 ENCOUNTER — Emergency Department: Admit: 2024-02-11 | Payer: Medicare (Managed Care)

## 2024-02-11 ENCOUNTER — Inpatient Hospital Stay
Admit: 2024-02-11 | Discharge: 2024-02-11 | Disposition: A | Discharge status: Home Health | Payer: Medicare (Managed Care) | Arrived: AM | Attending: Emergency Medicine

## 2024-02-11 DIAGNOSIS — R739 Hyperglycemia, unspecified: Secondary | ICD-10-CM

## 2024-02-11 DIAGNOSIS — E1100 Type 2 diabetes mellitus with hyperosmolarity without nonketotic hyperglycemic-hyperosmolar coma (NKHHC): Principal | ICD-10-CM

## 2024-02-11 LAB — URINALYSIS WITH REFLEX TO CULTURE
BACTERIA, URINE: NEGATIVE /HPF
Bilirubin, Urine: NEGATIVE
Blood, Urine: NEGATIVE
Glucose, Ur: >1000 mg/dL — AB
Ketones, Urine: NEGATIVE mg/dL
Leukocyte Esterase, Urine: NEGATIVE
Nitrite, Urine: NEGATIVE
Protein, UA: NEGATIVE mg/dL
Specific Gravity, UA: >1.03 — ABNORMAL HIGH (ref 1.003–1.030)
Urobilinogen, Urine: 0.2 EU/dL (ref 0.2–1.0)
pH, Urine: 5.5 (ref 5.0–8.0)

## 2024-02-11 LAB — POCT GLUCOSE
POC Glucose: >600 mg/dL (ref 65–117)
POC Glucose: >600 mg/dL (ref 65–117)
POC Glucose: 540 mg/dL — ABNORMAL HIGH (ref 65–117)
POC Glucose: 414 mg/dL — ABNORMAL HIGH (ref 65–117)
POC Glucose: 353 mg/dL — ABNORMAL HIGH (ref 65–117)
POC Glucose: 288 mg/dL — ABNORMAL HIGH (ref 65–117)
POC Glucose: 213 mg/dL — ABNORMAL HIGH (ref 65–117)
POC Glucose: 109 mg/dL (ref 65–117)
POC Glucose: 122 mg/dL — ABNORMAL HIGH (ref 65–117)
POC Glucose: 150 mg/dL — ABNORMAL HIGH (ref 65–117)
POC Glucose: 211 mg/dL — ABNORMAL HIGH (ref 65–117)
POC Glucose: 219 mg/dL — ABNORMAL HIGH (ref 65–117)

## 2024-02-11 LAB — CBC WITH AUTO DIFFERENTIAL
Basophils %: 0.4 % (ref 0.0–1.0)
Basophils Absolute: 0.03 K/UL (ref 0.00–0.10)
Eosinophils %: 0.8 % (ref 0.0–7.0)
Eosinophils Absolute: 0.06 K/UL (ref 0.00–0.40)
Hematocrit: 32.6 % — ABNORMAL LOW (ref 35.0–47.0)
Hemoglobin: 11.3 g/dL — ABNORMAL LOW (ref 11.5–16.0)
Immature Granulocytes %: 1.3 % — ABNORMAL HIGH (ref 0–0.5)
Immature Granulocytes Absolute: 0.1 K/UL — ABNORMAL HIGH (ref 0.00–0.04)
Lymphocytes %: 25.6 % (ref 12.0–49.0)
Lymphocytes Absolute: 2 K/UL (ref 0.80–3.50)
MCH: 30.1 pg (ref 26.0–34.0)
MCHC: 34.7 g/dL (ref 30.0–36.5)
MCV: 86.9 FL (ref 80.0–99.0)
MPV: 9.7 FL (ref 8.9–12.9)
Monocytes %: 5.2 % (ref 5.0–13.0)
Monocytes Absolute: 0.41 K/UL (ref 0.00–1.00)
Neutrophils %: 66.7 % (ref 32.0–75.0)
Neutrophils Absolute: 5.22 K/UL (ref 1.80–8.00)
Nucleated RBCs: 0 /100{WBCs}
Platelets: 228 K/uL (ref 150–400)
RBC: 3.75 M/uL — ABNORMAL LOW (ref 3.80–5.20)
RDW: 13.2 % (ref 11.5–14.5)
WBC: 7.8 K/uL (ref 3.6–11.0)
nRBC: 0 K/uL (ref 0.00–0.01)

## 2024-02-11 LAB — COMPREHENSIVE METABOLIC PANEL
ALT: 13 U/L (ref 10–35)
AST: 13 U/L (ref 10–35)
Albumin/Globulin Ratio: 1.8 (ref 1.1–2.2)
Albumin: 3.7 g/dL (ref 3.5–5.2)
Alk Phosphatase: 54 U/L (ref 35–104)
Anion Gap: 16 mmol/L — ABNORMAL HIGH (ref 2–12)
BUN/Creatinine Ratio: 13 (ref 12–20)
BUN: 11 mg/dL (ref 8–23)
CO2: 23 mmol/L (ref 22–29)
Calcium: 9 mg/dL (ref 8.8–10.2)
Chloride: 92 mmol/L — ABNORMAL LOW (ref 98–107)
Creatinine: 0.84 mg/dL (ref 0.50–0.90)
Est, Glom Filt Rate: 78 ml/min/1.73m2 (ref >60)
Globulin: 2.1 g/dL (ref 2.0–4.0)
Glucose: 702 mg/dL (ref 65–100)
Potassium: 3.4 mmol/L — ABNORMAL LOW (ref 3.5–5.1)
Sodium: 131 mmol/L — ABNORMAL LOW (ref 136–145)
Total Bilirubin: 0.3 mg/dL (ref 0.2–1.0)
Total Protein: 5.8 g/dL — ABNORMAL LOW (ref 6.4–8.3)

## 2024-02-11 LAB — HEMOGLOBIN A1C
Estimated Avg Glucose: 407 mg/dL
Hemoglobin A1C: 15.8 % — ABNORMAL HIGH (ref 4.0–5.6)

## 2024-02-11 LAB — BASIC METABOLIC PANEL
Anion Gap: 11 mmol/L (ref 2–14)
Anion Gap: 10 mmol/L (ref 2–14)
Anion Gap: 10 mmol/L (ref 2–14)
BUN/Creatinine Ratio: 12 (ref 12–20)
BUN/Creatinine Ratio: 11 — ABNORMAL LOW (ref 12–20)
BUN/Creatinine Ratio: 12 (ref 12–20)
BUN: 8 mg/dL (ref 8–23)
BUN: 7 mg/dL — ABNORMAL LOW (ref 8–23)
BUN: 7 mg/dL — ABNORMAL LOW (ref 8–23)
CO2: 23 mmol/L (ref 20–29)
CO2: 24 mmol/L (ref 20–29)
CO2: 24 mmol/L (ref 20–29)
Calcium: 8.6 mg/dL — ABNORMAL LOW (ref 8.8–10.2)
Calcium: 8 mg/dL — ABNORMAL LOW (ref 8.8–10.2)
Calcium: 7.6 mg/dL — ABNORMAL LOW (ref 8.8–10.2)
Chloride: 102 mmol/L (ref 98–107)
Chloride: 101 mmol/L (ref 98–107)
Chloride: 101 mmol/L (ref 98–107)
Creatinine: 0.66 mg/dL (ref 0.60–1.00)
Creatinine: 0.57 mg/dL — ABNORMAL LOW (ref 0.60–1.00)
Creatinine: 0.56 mg/dL — ABNORMAL LOW (ref 0.60–1.00)
Est, Glom Filt Rate: >90 ml/min/1.73m2 (ref >90)
Est, Glom Filt Rate: >90 ml/min/1.73m2 (ref >90)
Est, Glom Filt Rate: >90 ml/min/1.73m2 (ref >90)
Glucose: 131 mg/dL — ABNORMAL HIGH (ref 65–100)
Glucose: 196 mg/dL — ABNORMAL HIGH (ref 65–100)
Glucose: 203 mg/dL — ABNORMAL HIGH (ref 65–100)
Potassium: 3.3 mmol/L — ABNORMAL LOW (ref 3.5–5.1)
Potassium: 4 mmol/L (ref 3.5–5.1)
Potassium: 3.3 mmol/L — ABNORMAL LOW (ref 3.5–5.1)
Sodium: 135 mmol/L — ABNORMAL LOW (ref 136–145)
Sodium: 134 mmol/L — ABNORMAL LOW (ref 136–145)
Sodium: 134 mmol/L — ABNORMAL LOW (ref 136–145)

## 2024-02-11 LAB — TSH: TSH, 3rd Generation: 52.6 u[IU]/mL — ABNORMAL HIGH (ref 0.270–4.200)

## 2024-02-11 LAB — MAGNESIUM
Magnesium: 1.6 mg/dL (ref 1.6–2.4)
Magnesium: 2 mg/dL (ref 1.6–2.4)
Magnesium: 1.8 mg/dL (ref 1.6–2.4)

## 2024-02-11 LAB — PHOSPHORUS
Phosphorus: 2.1 mg/dL — ABNORMAL LOW (ref 2.5–4.5)
Phosphorus: 3.3 mg/dL (ref 2.5–4.5)
Phosphorus: 3.2 mg/dL (ref 2.5–4.5)

## 2024-02-11 LAB — BRAIN NATRIURETIC PEPTIDE: NT Pro-BNP: 71 pg/mL (ref 0–125)

## 2024-02-11 LAB — URINE CULTURE HOLD SAMPLE

## 2024-02-11 LAB — TROPONIN
Troponin T: 28.3 ng/L — ABNORMAL HIGH (ref 0–14)
Troponin T: 30.1 ng/L — ABNORMAL HIGH (ref 0–14)
Troponin T: 24.9 ng/L — ABNORMAL HIGH (ref 0–14)

## 2024-02-11 MED ORDER — FUROSEMIDE 40 MG PO TABS
40 | Freq: Every day | ORAL | Status: DC
Start: 2024-02-11 — End: 2024-02-12
  Administered 2024-02-11 – 2024-02-12 (×2): 40 mg via ORAL

## 2024-02-11 MED ORDER — LINACLOTIDE 72 MCG PO CAPS
72 | Freq: Every day | ORAL | Status: DC
Start: 2024-02-11 — End: 2024-02-12

## 2024-02-11 MED ORDER — GLUCAGON (RDNA) 1 MG IJ KIT
1 | INTRAMUSCULAR | Status: DC | PRN
Start: 2024-02-11 — End: 2024-02-12

## 2024-02-11 MED ORDER — LIDOCAINE 4 % EX PTCH
4 | Freq: Every day | CUTANEOUS | Status: DC
Start: 2024-02-11 — End: 2024-02-12
  Administered 2024-02-11 – 2024-02-12 (×2): 1 via TRANSDERMAL

## 2024-02-11 MED ORDER — ACETAMINOPHEN 500 MG PO TABS
500 | ORAL | Status: AC
Start: 2024-02-11 — End: 2024-02-11
  Administered 2024-02-11: 09:00:00 1000 mg via ORAL

## 2024-02-11 MED ORDER — GLUCOSE 4 G PO CHEW
4 | ORAL | Status: DC | PRN
Start: 2024-02-11 — End: 2024-02-12

## 2024-02-11 MED ORDER — INSULIN LISPRO 100 UNIT/ML IJ SOLN
100 | Freq: Four times a day (QID) | INTRAMUSCULAR | Status: DC
Start: 2024-02-11 — End: 2024-02-12
  Administered 2024-02-11: 21:00:00 2 [IU] via SUBCUTANEOUS
  Administered 2024-02-12: 17:00:00 6 [IU] via SUBCUTANEOUS
  Administered 2024-02-12: 01:00:00 2 [IU] via SUBCUTANEOUS
  Administered 2024-02-12: 13:00:00 4 [IU] via SUBCUTANEOUS

## 2024-02-11 MED ORDER — HYDROMORPHONE 0.5MG/0.5ML IJ SOLN
1 | Status: DC | PRN
Start: 2024-02-11 — End: 2024-02-12
  Administered 2024-02-11 – 2024-02-12 (×6): 0.25 mg via INTRAVENOUS

## 2024-02-11 MED ORDER — ASPIRIN 81 MG PO TBEC
81 | Freq: Every day | ORAL | Status: DC
Start: 2024-02-11 — End: 2024-02-12
  Administered 2024-02-11 – 2024-02-12 (×2): 81 mg via ORAL

## 2024-02-11 MED ORDER — MAGNESIUM SULFATE 2000 MG/50 ML IVPB PREMIX
2 | INTRAVENOUS | Status: DC | PRN
Start: 2024-02-11 — End: 2024-02-12
  Administered 2024-02-11: 16:00:00 2000 mg via INTRAVENOUS

## 2024-02-11 MED ORDER — LOSARTAN POTASSIUM 50 MG PO TABS
50 | Freq: Every day | ORAL | Status: DC
Start: 2024-02-11 — End: 2024-02-12

## 2024-02-11 MED ORDER — POLYETHYLENE GLYCOL 3350 17 G PO PACK
17 | Freq: Every day | ORAL | Status: DC | PRN
Start: 2024-02-11 — End: 2024-02-12

## 2024-02-11 MED ORDER — CAPSAICIN 0.025 % EX CREA
0.025 | CUTANEOUS | Status: AC
Start: 2024-02-11 — End: 2024-02-11
  Administered 2024-02-11: 09:00:00 via TOPICAL

## 2024-02-11 MED ORDER — SERTRALINE HCL 50 MG PO TABS
50 | Freq: Every day | ORAL | Status: DC
Start: 2024-02-11 — End: 2024-02-12
  Administered 2024-02-11 – 2024-02-12 (×2): 50 mg via ORAL

## 2024-02-11 MED ORDER — DEXTROSE 10 % IV BOLUS
INTRAVENOUS | Status: DC | PRN
Start: 2024-02-11 — End: 2024-02-12

## 2024-02-11 MED ORDER — SODIUM CHLORIDE 0.9 % IV SOLN
0.9 | INTRAVENOUS | Status: DC
Start: 2024-02-11 — End: 2024-02-12
  Administered 2024-02-11: 08:00:00 9.6 [IU]/h via INTRAVENOUS

## 2024-02-11 MED ORDER — LEVOTHYROXINE SODIUM 25 MCG PO TABS
25 | Freq: Every day | ORAL | Status: DC
Start: 2024-02-11 — End: 2024-02-12
  Administered 2024-02-11: 12:00:00 175 ug via ORAL

## 2024-02-11 MED ORDER — POTASSIUM CHLORIDE 10 MEQ/100ML IV SOLN
10 | INTRAVENOUS | Status: DC | PRN
Start: 2024-02-11 — End: 2024-02-12
  Administered 2024-02-11 (×4): 10 meq via INTRAVENOUS

## 2024-02-11 MED ORDER — SODIUM CHLORIDE 0.45 % IV SOLN
0.45 | INTRAVENOUS | Status: DC
Start: 2024-02-11 — End: 2024-02-11
  Administered 2024-02-11: 12:00:00 via INTRAVENOUS

## 2024-02-11 MED ORDER — ATORVASTATIN CALCIUM 20 MG PO TABS
20 | Freq: Every evening | ORAL | Status: DC
Start: 2024-02-11 — End: 2024-02-12
  Administered 2024-02-12: 01:00:00 40 mg via ORAL

## 2024-02-11 MED ORDER — INSULIN GLARGINE 100 UNIT/ML SC SOLN
100 | Freq: Every day | SUBCUTANEOUS | Status: DC
Start: 2024-02-11 — End: 2024-02-12
  Administered 2024-02-11: 19:00:00 28 [IU] via SUBCUTANEOUS

## 2024-02-11 MED ORDER — SODIUM CHLORIDE 0.9 % IV BOLUS
0.9 | Freq: Once | INTRAVENOUS | Status: AC
Start: 2024-02-11 — End: 2024-02-11
  Administered 2024-02-11: 08:00:00 1000 mL via INTRAVENOUS

## 2024-02-11 MED ORDER — KCL IN DEXTROSE-NACL 20-5-0.45 MEQ/L-%-% IV SOLN
20-5-0.45 | INTRAVENOUS | Status: DC | PRN
Start: 2024-02-11 — End: 2024-02-12
  Administered 2024-02-11: 13:00:00 via INTRAVENOUS

## 2024-02-11 MED ORDER — DEXTROSE 10 % IV SOLN
10 | INTRAVENOUS | Status: DC | PRN
Start: 2024-02-11 — End: 2024-02-12

## 2024-02-11 MED ORDER — ENOXAPARIN SODIUM 40 MG/0.4ML IJ SOSY
40 | Freq: Every day | INTRAMUSCULAR | Status: DC
Start: 2024-02-11 — End: 2024-02-12
  Administered 2024-02-11 – 2024-02-12 (×2): 40 mg via SUBCUTANEOUS

## 2024-02-11 MED ORDER — CARVEDILOL 3.125 MG PO TABS
3.125 | Freq: Two times a day (BID) | ORAL | Status: DC
Start: 2024-02-11 — End: 2024-02-12
  Administered 2024-02-11 – 2024-02-12 (×3): 3.125 mg via ORAL

## 2024-02-11 MED ORDER — SODIUM CHLORIDE 0.9 % IV SOLN
0.9 | INTRAVENOUS | Status: DC | PRN
Start: 2024-02-11 — End: 2024-02-12
  Administered 2024-02-11: 18:00:00 15 mmol via INTRAVENOUS

## 2024-02-11 MED ORDER — INSULIN LISPRO 100 UNIT/ML IJ SOLN
100 | Freq: Three times a day (TID) | INTRAMUSCULAR | Status: DC
Start: 2024-02-11 — End: 2024-02-12
  Administered 2024-02-11: 21:00:00 7 [IU] via SUBCUTANEOUS

## 2024-02-11 MED ORDER — IBUPROFEN 600 MG PO TABS
600 | ORAL | Status: AC
Start: 2024-02-11 — End: 2024-02-11
  Administered 2024-02-11: 08:00:00 600 mg via ORAL

## 2024-02-11 MED FILL — INSULIN LISPRO 100 UNIT/ML IJ SOLN: 100 [IU]/mL | INTRAMUSCULAR | Qty: 7 | Fill #0

## 2024-02-11 MED FILL — KCL IN DEXTROSE-NACL 20-5-0.45 MEQ/L-%-% IV SOLN: 20-5-0.45 MEQ/L-%-% | INTRAVENOUS | Qty: 1000 | Fill #0

## 2024-02-11 MED FILL — ASPIRIN LOW DOSE 81 MG PO TBEC: 81 mg | ORAL | Qty: 1 | Fill #0

## 2024-02-11 MED FILL — ENOXAPARIN SODIUM 40 MG/0.4ML IJ SOSY: 40 MG/0.4ML | INTRAMUSCULAR | Qty: 0.4 | Fill #0

## 2024-02-11 MED FILL — CARVEDILOL 3.125 MG PO TABS: 3.125 mg | ORAL | Qty: 1 | Fill #0

## 2024-02-11 MED FILL — POTASSIUM CHLORIDE 10 MEQ/100ML IV SOLN: 10 MEQ/0ML | INTRAVENOUS | Qty: 100 | Fill #0

## 2024-02-11 MED FILL — SODIUM PHOSPHATES 45 MMOLE/15ML IV SOLN: 45 MMOLE/15ML | INTRAVENOUS | Qty: 5 | Fill #0

## 2024-02-11 MED FILL — SODIUM CHLORIDE 0.9 % IV SOLN: 0.9 % | INTRAVENOUS | Qty: 1000 | Fill #0

## 2024-02-11 MED FILL — ACETAMINOPHEN EXTRA STRENGTH 500 MG PO TABS: 500 mg | ORAL | Qty: 2 | Fill #0

## 2024-02-11 MED FILL — SERTRALINE HCL 50 MG PO TABS: 50 mg | ORAL | Qty: 1 | Fill #0

## 2024-02-11 MED FILL — FUROSEMIDE 40 MG PO TABS: 40 mg | ORAL | Qty: 1 | Fill #0

## 2024-02-11 MED FILL — DEXTROSE 10 % IV SOLN: 10 % | INTRAVENOUS | Qty: 500 | Fill #0

## 2024-02-11 MED FILL — HYDROMORPHONE HCL 1 MG/ML IJ SOLN: 1 mg/mL | INTRAMUSCULAR | Qty: 0.5 | Fill #0

## 2024-02-11 MED FILL — LANTUS 100 UNIT/ML SC SOLN: 100 [IU]/mL | SUBCUTANEOUS | Qty: 28 | Fill #0

## 2024-02-11 MED FILL — LEVOTHYROXINE SODIUM 50 MCG PO TABS: 50 ug | ORAL | Qty: 1 | Fill #0

## 2024-02-11 MED FILL — LIDOCAINE PAIN RELIEF 4 % EX PTCH: 4 % | CUTANEOUS | Qty: 1 | Fill #0

## 2024-02-11 MED FILL — NOVOLIN R 100 UNIT/ML IJ SOLN: 100 [IU]/mL | INTRAMUSCULAR | Qty: 100 | Fill #0

## 2024-02-11 MED FILL — INSULIN LISPRO 100 UNIT/ML IJ SOLN: 100 [IU]/mL | INTRAMUSCULAR | Qty: 2 | Fill #0

## 2024-02-11 MED FILL — CAPSAICIN 0.025 % EX CREA: 0.025 % | CUTANEOUS | Qty: 60 | Fill #0

## 2024-02-11 MED FILL — MAGNESIUM SULFATE 2 GM/50ML IV SOLN: 2 GM/50ML | INTRAVENOUS | Qty: 50 | Fill #0

## 2024-02-11 MED FILL — SODIUM CHLORIDE 0.45 % IV SOLN: 0.45 % | INTRAVENOUS | Qty: 1000 | Fill #0

## 2024-02-11 MED FILL — IBUPROFEN 600 MG PO TABS: 600 mg | ORAL | Qty: 1 | Fill #0

## 2024-02-11 NOTE — ED Provider Notes (Signed)
 [facility] EMERGENCY DEPARTMENT  EMERGENCY DEPARTMENT ENCOUNTER      Pt Name: Katrina Bowen  MRN: [id number]  Birthdate 06/30/1961  Date of evaluation: 02/11/2024  Provider: Toribio DELENA Borg, MD    CHIEF COMPLAINT       Chief Complaint   Patient presents with    Hyperglycemia    Homeless    Leg Pain       ALLERGIES     Latex, Amitriptyline, Corticosteroids, Penicillins, Pregabalin, Duloxetine, and Gabapentin    ENCOUNTER     Past Medical History:   Diagnosis Date    Arthritis     Asthma     CHF (congestive heart failure) (HCC)     Chronic pain     Diabetes mellitus (HCC)     Diverticulitis     Fibromyalgia     Heart attack (HCC)     X2    History of ear infections     pt stated multiple    Hyperlipidemia     Hypertension     Hypothyroid     Neuropathy        HISTORY OF PRESENT ILLNESS  63 year old female with a history of diabetes mellitus and diabetic neuropathy was brought in by EMS due to lack of access to diabetes medications and severe hyperglycemia, with bedside capillary glucose over the detectable limit (>600 mg/dL). She has been unable to obtain Lantus  insulin  for approximately 6 months due to homelessness and lack of prescription, and previously received care from a physician in [place] before moving. She reports worsening foot pain and right anterior shoulder pain following a fall earlier today, with tenderness but no deformity, effusion, bruising, or chest wall tenderness. She denies recent chest wall trauma and has difficulty accessing healthcare due to homelessness.    HEALTHCARE PROVIDERS  - Previous physician in [place] (name not provided)    PAST MEDICAL HISTORY  - Diabetes mellitus  - Neuropathy    RELEVANT SOCIAL DETERMINANTS OF HEALTH  - Patient is homeless, which significantly impacts her ability to access healthcare and obtain diabetes medications.  - Patient has difficulty accessing healthcare and medications due to homelessness.    PHYSICAL EXAM  General: NAD; well-kept female adult.  Eyes:  Appear normal.  HENT: Atraumatic; moist mucous membranes.  Cardiac: Regular rate; regular rhythm; normal heart sounds.  Respiratory: No respiratory distress; clear lungs bilaterally; no abnormal breath sounds.  Abdomen: Soft; nontender; nondistended; no rebound; no guarding.  MS: Extremities atraumatic; no edema; tenderness in right anterior shoulder.  Skin: No exanthems; no cyanosis; no diaphoresis.  Neurologic: No altered mental status; speech is fluent; no gross motor deficits.  Psychological: Cooperative and participatory with examination.    SUMMARY  63 year old female presented with severe hyperglycemia and right shoulder pain after a fall, due to lack of access to diabetes medications and homelessness. Exam showed right anterior shoulder tenderness; imaging revealed no fracture. Labs demonstrated markedly elevated glucose and pseudohyponatremia. Diagnosed with uncontrolled type 2 diabetes mellitus, diabetic neuropathy, and right shoulder pain. Admission required for stabilization, diabetes education, and medication refills due to failed outpatient management and ongoing social barriers.    LABS  The following laboratory tests were performed and independently reviewed with results being unremarkable: CO2 (23). Notable laboratory findings included: blood glucose was dramatically elevated, with bedside glucose over the detectable limit; sodium was 131, which is likely pseudohyponatremia given the elevation of glucose above 600. Laboratory evaluation did not indicate diabetic ketoacidosis.    MEDICATION/FLUID ADMINISTRATION  - Patient was  started on an insulin  drip in the emergency department.  - Patient was administered 2 liters of normal saline IV for dehydration and hyperglycemia.    PATIENT DISCUSSION  I discussed available results and the treatment plan with the patient. I will review additional lab results and discuss further management once they are available.    MEDICAL DECISION MAKING  This 63 year old  female with a history of type 2 diabetes mellitus and diabetic neuropathy presented via EMS with severe hyperglycemia (capillary glucose >600, bloody glucose dramatically elevated) due to lack of access to medications, compounded by homelessness. She has not taken Lantus  insulin  for approximately 6 months and has no current primary care provider, which has contributed to poor glycemic control.  On exam, she was found to have right anterior shoulder tenderness after a fall, but imaging revealed no fracture or dislocation. Laboratory evaluation showed normal CO2 (23) and no evidence of diabetic ketoacidosis, but sodium was likely pseudohyponatremic due to marked hyperglycemia. She was started on an insulin  drip and received 2 liters of normal saline for dehydration and hyperglycemia. Given her inability to access medications, failure of outpatient management, and ongoing social determinants of health (homelessness), hospital admission is necessary for stabilization, diabetes education, and medication refills. I will review pending labs and continue to monitor for complications.    DIFFERENTIAL DIAGNOSES  - Diabetic Ketoacidosis: Considered due to severe hyperglycemia; less likely as labs do not indicate DKA and CO2 is normal at 23.  - Hyperosmolar Hyperglycemic State: Considered given glucose >600 and dehydration; less likely as patient is alert and metabolic panel pending, but no coma or severe mental status changes reported.  - Acute Shoulder Fracture: Considered due to fall and right shoulder pain; less likely as x-ray of right shoulder showed no evidence of fracture.  - Acute Shoulder Dislocation: Considered due to fall and shoulder pain; less likely as exam showed no deformity, effusion, or bruising.  - Cellulitis: Considered due to pain and tenderness; less likely as there is no evidence of erythema, swelling, or warmth on exam.    DISPOSITION  Inpatient: Hospital    Admission is required for severe  hyperglycemia due to uncontrolled type 2 diabetes mellitus, lack of access to medications, and homelessness, with failure of outpatient management and need for stabilization, diabetes education, and medication refills.    DIAGNOSIS  Primary Diagnosis:   - Hyperglycemia due to uncontrolled type 2 diabetes mellitus (E11.65)    Secondary Diagnoses:  - Diabetic neuropathy (E11.40)  - Right shoulder pain after fall (M25.511)  - Pseudohyponatremia (E87.1)    ED Triage Vitals [02/11/24 0030]   BP Girls Systolic BP Percentile Girls Diastolic BP Percentile Boys Systolic BP Percentile Boys Diastolic BP Percentile Temp Temp Source Pulse   (!) 164/92 -- -- -- -- 98.9 F (37.2 C) Oral 86      Respirations SpO2 Height Weight - Scale       16 100 % 1.575 m (5' 2) 71.4 kg (157 lb 8 oz)           CURRENT MEDICATIONS       Previous Medications    ACETAMINOPHEN  (TYLENOL ) 500 MG TABLET    Take 2 tablets by mouth every 6 hours as needed for Pain    ALBUTEROL  SULFATE HFA (PROVENTIL  HFA) 108 (90 BASE) MCG/ACT INHALER    Inhale 2 puffs into the lungs every 4 hours as needed for Wheezing    ASPIRIN  81 MG EC TABLET    Take 1 tablet by  mouth daily    ATORVASTATIN  (LIPITOR ) 40 MG TABLET    Take 1 tablet by mouth nightly    CARVEDILOL  (COREG ) 3.125 MG TABLET    TAKE 1 TABLET BY MOUTH 2 TIMES DAILY WITH A MEAL.    CLOPIDOGREL  (PLAVIX ) 75 MG TABLET    Take 1 tablet by mouth daily    DICLOFENAC SODIUM (VOLTAREN) 1 % GEL    Apply 2 g topically 4 times daily    EMPAGLIFLOZIN (JARDIANCE) 25 MG TABLET    Take 1 tablet by mouth daily    FUROSEMIDE  (LASIX ) 40 MG TABLET    Take 1 tablet by mouth daily    IBUPROFEN  (ADVIL ;MOTRIN ) 200 MG TABLET    Take 2 tablets by mouth every 6 hours as needed    LANTUS  SOLOSTAR 100 UNIT/ML INJECTION PEN    Inject 30 Units into the skin daily    LEVOTHYROXINE  (SYNTHROID ) 175 MCG TABLET    Take 1 tablet by mouth Daily    LINACLOTIDE (LINZESS) 72 MCG CAPS CAPSULE    Take 1 capsule by mouth every morning (before breakfast)     LOSARTAN  (COZAAR ) 100 MG TABLET    Take 1 tablet by mouth daily    NAPROXEN  (NAPROSYN ) 500 MG TABLET    Take 1 tablet by mouth 2 times daily (with meals)    ONDANSETRON  (ZOFRAN -ODT) 4 MG DISINTEGRATING TABLET    Take 1 tablet by mouth 3 times daily as needed for Nausea or Vomiting    SERTRALINE  (ZOLOFT ) 100 MG TABLET    Take 1 tablet by mouth daily       REASSESSMENT     Vitals:    02/11/24 0030   BP: (!) 164/92   Pulse: 86   Resp: 16   Temp: 98.9 F (37.2 C)   TempSrc: Oral   SpO2: 100%   Weight: 71.4 kg (157 lb 8 oz)   Height: 1.575 m (5' 2)          EKG: All EKG's are interpreted by the Emergency Department Physician who either signs or Co-signs this chart in the absence of a cardiologist.      RADIOLOGY:   Non-plain film images such as CT, Ultrasound and MRI are read by the radiologist. Plain radiographic images are visualized and preliminarily interpreted by the emergency physician.    Interpretation per the Radiologist below, if available at the time of this note:    XR SHOULDER RIGHT (MIN 2 VIEWS)    (Results Pending)        LABS:  Labs Reviewed   URINALYSIS WITH REFLEX TO CULTURE - Abnormal; Notable for the following components:       Result Value    Specific Gravity, UA >1.030 (*)     Glucose, Ur >1000 (*)     Yeast, UA PRESENT (*)     All other components within normal limits   CBC WITH AUTO DIFFERENTIAL - Abnormal; Notable for the following components:    RBC 3.75 (*)     Hemoglobin 11.3 (*)     Hematocrit 32.6 (*)     Immature Granulocytes % 1.3 (*)     Immature Granulocytes Absolute 0.10 (*)     All other components within normal limits   COMPREHENSIVE METABOLIC PANEL - Abnormal; Notable for the following components:    Sodium 131 (*)     Potassium 3.4 (*)     Chloride 92 (*)     Anion Gap 16 (*)  Total Protein 5.8 (*)     All other components within normal limits   POCT GLUCOSE - Abnormal; Notable for the following components:    POC Glucose >600 (*)     All other components within normal  limits   POCT GLUCOSE - Abnormal; Notable for the following components:    POC Glucose >600 (*)     All other components within normal limits   URINE CULTURE HOLD SAMPLE   HEMOGLOBIN A1C       CONSULTS:  None    PROCEDURES:     Procedures      FINAL IMPRESSION      1. Hyperglycemia without ketosis    2. Moderate dehydration    3. Diabetic polyneuropathy associated with type 2 diabetes mellitus (HCC)    4. Noncompliance with diabetes treatment          DISPOSITION/PLAN   DISPOSITION Decision To Admit 02/11/2024 02:58:39 AM    [facility] Consult for Admission  3:00 AM    ED Room Number: C11/C11  Patient Name and age:  Katrina Bowen 63 y.o.  female  Working Diagnosis:   1. Hyperglycemia without ketosis    2. Moderate dehydration    3. Diabetic polyneuropathy associated with type 2 diabetes mellitus (HCC)    4. Noncompliance with diabetes treatment        COVID-19 Suspicion: No  Sepsis present:  No  Reassessment needed: No  Code Status:  Full Code  Readmission: No  Isolation Requirements: no  Recommended Level of Care: step down  Department: [facility] ED - [phone]  Consulting Provider: Donne    Other:  63 y.o. female presents with severe hyperglycemia after prolonged lack of access to insulin  and Jardiance. Started on insulin  infusion for severely elevated glucose. Will need diabetes teaching and to restart medications on discharge.     Total critical care time spent exclusive of procedures:  31 minutes.      PATIENT REFERRED TO:  No follow-up provider specified.    DISCHARGE MEDICATIONS:  New Prescriptions    No medications on file         (Please note that portions of this note were completed with a transcription program.  Efforts were made to edit the dictations but occasionally words are mis-transcribed.)    Toribio DELENA Borg, MD (electronically signed)             Borg Toribio DELENA, MD  02/11/24 [phone]

## 2024-02-11 NOTE — Plan of Care (Signed)
 Problem: Chronic Conditions and Co-morbidities  Goal: Patient's chronic conditions and co-morbidity symptoms are monitored and maintained or improved  02/11/2024 2146 by Fenton Smalls, RN  Outcome: Progressing  02/11/2024 2146 by Fenton Smalls, RN  Outcome: Progressing     Problem: Safety - Adult  Goal: Free from fall injury  02/11/2024 2146 by Fenton Smalls, RN  Outcome: Progressing  02/11/2024 2146 by Fenton Smalls, RN  Outcome: Progressing     Problem: Pain  Goal: Verbalizes/displays adequate comfort level or baseline comfort level  02/11/2024 2146 by Fenton Smalls, RN  Outcome: Progressing  02/11/2024 2146 by Fenton Smalls, RN  Outcome: Progressing     Problem: Discharge Planning  Goal: Discharge to home or other facility with appropriate resources  02/11/2024 2146 by Fenton Smalls, RN  Outcome: Not Progressing  Flowsheets (Taken 02/11/2024 2146)  Discharge to home or other facility with appropriate resources: Arrange for needed discharge resources and transportation as appropriate  Note: Case management   02/11/2024 2146 by Fenton Smalls, RN  Outcome: Progressing  Flowsheets (Taken 02/11/2024 2146)  Discharge to home or other facility with appropriate resources: Arrange for needed discharge resources and transportation as appropriate

## 2024-02-11 NOTE — ED Notes (Signed)
 TRANSFER - OUT REPORT:    Verbal report given to Victory, RN on Katrina Bowen  being transferred to [facility] ED for routine progression of patient care       Report consisted of patient's Situation, Background, Assessment and   Recommendations(SBAR).     Information from the following report(s) Nurse Handoff Report, Index, ED Encounter Summary, ED SBAR, Adult Overview, MAR, Recent Results, Cardiac Rhythm SR-SB, and Neuro Assessment was reviewed with the receiving nurse.    Kinder Fall Assessment:    Presents to emergency department  because of falls (Syncope, seizure, or loss of consciousness): No  Age > 70: No  Altered Mental Status, Intoxication with alcohol or substance confusion (Disorientation, impaired judgment, poor safety awaremess, or inability to follow instructions): No  Impaired Mobility: Ambulates or transfers with assistive devices or assistance; Unable to ambulate or transer.: Yes  Nursing Judgement: No          Lines:   Peripheral IV 02/11/24 Left Antecubital (Active)   Site Assessment Clean, dry & intact 02/11/24 0140   Line Status Capped 02/11/24 0140   Line Care Connections checked and tightened 02/11/24 0140   Phlebitis Assessment No symptoms 02/11/24 0140   Infiltration Assessment 0 02/11/24 0140   Alcohol Cap Used No 02/11/24 0140   Dressing Status Clean, dry & intact 02/11/24 0140   Dressing Type Transparent 02/11/24 0140       Peripheral IV 02/11/24 Distal;Posterior;Right Forearm (Active)   Site Assessment Clean, dry & intact 02/11/24 0413   Line Status Blood return noted;Flushed;Infusing 02/11/24 0413   Line Care Connections checked and tightened 02/11/24 0413   Phlebitis Assessment No symptoms 02/11/24 0413   Infiltration Assessment 0 02/11/24 0413   Alcohol Cap Used No 02/11/24 0413   Dressing Status Clean, dry & intact;New dressing applied 02/11/24 0413   Dressing Type Transparent 02/11/24 0413   Dressing Intervention New 02/11/24 0413        Opportunity for questions and clarification was  provided.      Patient transported with:  [facility]  Monitor and Patient-specific medications from Pharmacy

## 2024-02-11 NOTE — ED Notes (Signed)
 TRANSFER - OUT REPORT:    Verbal report given to Racole Rn on Katrina Bowen  being transferred to a394 for routine progression of patient care       Report consisted of patient's Situation, Background, Assessment and   Recommendations(SBAR).     Information from the following report(s) Adult Overview, Intake/Output, MAR, Med Rec Status, and Cardiac Rhythm nsr was reviewed with the receiving nurse.    Kinder Fall Assessment:    Presents to emergency department  because of falls (Syncope, seizure, or loss of consciousness): No  Age > 70: No  Altered Mental Status, Intoxication with alcohol or substance confusion (Disorientation, impaired judgment, poor safety awaremess, or inability to follow instructions): No  Impaired Mobility: Ambulates or transfers with assistive devices or assistance; Unable to ambulate or transer.: Yes  Nursing Judgement: No          Lines:   Peripheral IV 02/11/24 Left Antecubital (Active)   Site Assessment Clean, dry & intact 02/11/24 0140   Line Status Capped 02/11/24 0140   Line Care Connections checked and tightened 02/11/24 0140   Phlebitis Assessment No symptoms 02/11/24 0140   Infiltration Assessment 0 02/11/24 0140   Alcohol Cap Used No 02/11/24 0140   Dressing Status Clean, dry & intact 02/11/24 0140   Dressing Type Transparent 02/11/24 0140       Peripheral IV 02/11/24 Distal;Posterior;Right Forearm (Active)   Site Assessment Clean, dry & intact 02/11/24 0413   Line Status Blood return noted;Flushed;Infusing 02/11/24 0413   Line Care Connections checked and tightened 02/11/24 0413   Phlebitis Assessment No symptoms 02/11/24 0413   Infiltration Assessment 0 02/11/24 0413   Alcohol Cap Used No 02/11/24 0413   Dressing Status Clean, dry & intact;New dressing applied 02/11/24 0413   Dressing Type Transparent 02/11/24 0413   Dressing Intervention New 02/11/24 0413       Peripheral IV 02/11/24 Distal;Left Forearm (Active)        Opportunity for questions and clarification was provided.       Patient transported with:  [employer]
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
## 2024-02-11 NOTE — H&P (Signed)
 Hospitalist Admission Note    NAME:  Katrina Bowen   DOB:  1960-12-05   MRN:  [id number]     Date/Time:  02/11/2024 7:00 AM    Patient PCP: None, None  ________________________________________________________________________    Given the patient's current clinical presentation, I have a high level of concern for decompensation if discharged from the emergency department.  Complex decision making was performed, which includes reviewing the patient's available past medical records, laboratory results, and x-ray films.       My assessment of this patient's clinical condition and my plan of care is as follows.    Assessment / Plan:    Katrina Bowen is a 63 year old female with a history of T2DM, fibromyalgia, diabetic neuropathy, HTN, CHF, asthma, arthritis, hypothyroidism, CAD s/p PCI, unhoused, who is admitted for HHS.    #Hyperosmolar Hyperglyemic State  #Uncontrolled T2DM   POA BG >600 with AG 16, glucose in the urine, no ketones. Triggered by running out of insulin  x6 months. A1c 15.8%.  - admit to obs, VS per unit  - insulin  drip per protocol  - IVF per protocol  - q4h bmp, mag, phos  - will give subq lantus  once AG closed x2, then start diet and turn off insulin  drip after 2 hrs  - check beta hydroxybutyrate   - diabetes education consulted  - patient will need significant resources on discharge to prevent this from recurring given her social situation     #Pseudohyponatremia  POA Na 131 but corrects to normal given hyperglycemia.  - tx DKA as above    #Hypokalemia  POA, acute, d/t HHS.  - replete K per protocol, caution as insulin  will cause further lowering    #Anemia  POA, acute, mild with hb 11.3. likely 2/2 chronic disease.  - trend cbc daily    #Non ACS elevated troponin  POA, trop 28.3, flat on repeat. Has reproducible chest tenderness but no chest pressure. No need to trend further.    #Fibromyalgia  #Neuropathy  #Arthritis  #Fall prior to arrival  POA, chronic. Patient states most nerve pain medications don't work  or has allergy to them. XR R shoulder NAP.  - will try IV dilaudid  prn given acute pain  - lidocaine  patch  - PT/OT    #CAD s/p PCI  #HFpEF  #HLD  #HTN  POA, patient reports h/o 7 stents. TEE 11/2020 with EF 60-65%, LVH, diastolic dysfx.  - continue asa, lipitor , coreg , lasix , losartan  (patient was out of all of them but supposed to be taking)    #Unhoused  POA, patient has been living in motels. Has been unable to get her prescriptions, leading to this problem of HHS. Significant barrier to patient's care, health, and safe discharge.  - CM consult    #BMI (Calculated): 28.8          I have personally reviewed the radiographs, laboratory data in Epic and decisions and statements above are based partially on this personal interpretation.    Code Status: Full Code  DVT Prophylaxis: Lovenox   GI Prophylaxis: not indicated       Subjective:   CHIEF COMPLAINT: hyperglycemia    HISTORY OF PRESENT ILLNESS:     Katrina Bowen is a 63 year old female with a history of T2DM, fibromyalgia, diabetic neuropathy, HTN, CHF, asthma, arthritis, hypothyroidism, CAD s/p PCI, unhoused, who was brought in by EMS for undetectably high BG due to inability to get her medications including insulin  for 6 months. Has been living in motels. Also having  pain all over from her fibromyalgia. Aside from her pain she feels fine. Denies cp, sob, n/v/d, dysuria.      Available records were reviewed at the time of H&P.        Past Medical History:   Diagnosis Date    Arthritis     Asthma     CHF (congestive heart failure) (HCC)     Chronic pain     Diabetes mellitus (HCC)     Diverticulitis     Fibromyalgia     Heart attack (HCC)     X2    History of ear infections     pt stated multiple    Hyperlipidemia     Hypertension     Hypothyroid     Neuropathy       Past Surgical History:   Procedure Laterality Date    CARDIAC CATHETERIZATION      x3 procedures; 7 stents    CESAREAN SECTION      COLONOSCOPY      COLONOSCOPY N/A 01/08/2022    COLONOSCOPY performed by  Malvin Room, MD at [facility] END[facility]COPY    EAR SURGERY      pt stated several sx    HYSTERECTOMY (CERVIX STATUS UNKNOWN)       Social History     Tobacco Use    Smoking status: Former     Types: Cigarettes    Smokeless tobacco: Never   Substance Use Topics    Alcohol use: Not Currently     Comment: occassional      Family History   Problem Relation Age of Onset    No Known Problems Mother     No Known Problems Father         Allergies   Allergen Reactions    Latex Rash     Use paper tape only    Amitriptyline Anaphylaxis and Other (See Comments)     Muscle Cramps      Corticosteroids Anaphylaxis     ALL steroids.         Penicillins Anaphylaxis     Has patient had a PCN reaction causing immediate rash, facial/tongue/throat swelling, SOB or lightheadedness with hypotension: {Yes  Has patient had a PCN reaction causing severe rash involving mucus membranes or skin necrosis: NO  Has patient had a PCN reaction that required hospitalization NO  Has patient had a PCN reaction occurring within the last 10 years: {Yes  If all of the above answers are NO, then may proceed with Cephalosporin use.      Pregabalin Anaphylaxis, Shortness Of Breath and Swelling    Duloxetine Other (See Comments)     Other reaction(s): neurological reaction      Gabapentin Swelling        Prior to Admission medications   Medication Sig Start Date End Date Taking? Authorizing Provider   ondansetron  (ZOFRAN -ODT) 4 MG disintegrating tablet Take 1 tablet by mouth 3 times daily as needed for Nausea or Vomiting 10/22/23   Acie Bernett CROME, MD   aspirin  81 MG EC tablet Take 1 tablet by mouth daily 01/03/23   Shayya, Sami, MD   atorvastatin  (LIPITOR ) 40 MG tablet Take 1 tablet by mouth nightly 01/03/23   Shayya, Sami, MD   carvedilol  (COREG ) 3.125 MG tablet TAKE 1 TABLET BY MOUTH 2 TIMES DAILY WITH A MEAL. 01/03/23   Elpidio Kettle, MD   clopidogrel  (PLAVIX ) 75 MG tablet Take 1 tablet by mouth daily 01/03/23   Shayya, Sami, MD  furosemide  (LASIX ) 40 MG tablet Take 1  tablet by mouth daily 01/03/23   Shayya, Sami, MD   levothyroxine  (SYNTHROID ) 175 MCG tablet Take 1 tablet by mouth Daily 01/03/23   Shayya, Sami, MD   losartan  (COZAAR ) 100 MG tablet Take 1 tablet by mouth daily 01/03/23   Shayya, Sami, MD   albuterol  sulfate HFA (PROVENTIL  HFA) 108 (90 Base) MCG/ACT inhaler Inhale 2 puffs into the lungs every 4 hours as needed for Wheezing 01/03/23   Elpidio, Sami, MD   LANTUS  SOLOSTAR 100 UNIT/ML injection pen Inject 30 Units into the skin daily 01/03/23   Elpidio, Sami, MD   naproxen  (NAPROSYN ) 500 MG tablet Take 1 tablet by mouth 2 times daily (with meals) 05/30/22   Sequeira, Joran, MD   diclofenac sodium (VOLTAREN) 1 % GEL Apply 2 g topically 4 times daily 11/13/19   [provider]   empagliflozin (JARDIANCE) 25 MG tablet Take 1 tablet by mouth daily    [provider]   sertraline  (ZOLOFT ) 100 MG tablet Take 1 tablet by mouth daily 12/02/21   [provider]   linaCLOtide (LINZESS) 72 MCG CAPS capsule Take 1 capsule by mouth every morning (before breakfast)    [provider]   ibuprofen  (ADVIL ;MOTRIN ) 200 MG tablet Take 2 tablets by mouth every 6 hours as needed    [provider]   acetaminophen  (TYLENOL ) 500 MG tablet Take 2 tablets by mouth every 6 hours as needed for Pain    [provider]       REVIEW OF SYSTEMS:  See HPI for details      Objective:   VITALS:    Vitals:    02/11/24 0600   BP: 121/71   Pulse: 50   Resp: 13   Temp:    SpO2: 98%     PHYSICAL EXAM:    Physical Exam:    General: in acute distress. Alert. Cooperative.  Head: Normocephalic. Atraumatic.  Mouth: Mucosa pink.  Poor dentition, dry mucous membranes.   Respiratory: CTAB. No increased work of breathing. Symmetric chest rise b/l.  Cardiovascular: Tachycardic rate, regular rhythm. No clubbing or cyanosis  GI: NTND, +BS. No masses.   Extremities: no LE edema. Moving all extremities spontaneously.  Musculoskeletal: Full ROM in all extremities, no obvious  deformities. +peripheral pulses.  Tenderness to palpation over the chest wall  Skin: Warm, diaphoretic. No rashes.   Neuro: Alert, normal behavior. No focal deficit.     Labs:    Recent Labs     02/11/24  0040   WBC 7.8   HGB 11.3*   HCT 32.6*   PLT 228     Recent Labs     02/11/24  0040   NA 131*   K 3.4*   CL 92*   CO2 23   BUN 11   ALT 13     No components found for: GLPOC    No results for input(s): INR in the last 72 hours.    _______________________________________________________________________  Care Plan discussed with:    Comments   Patient x Discussed with patient in room. POC outlined and Questions answered    Family      RN x    Care Manager                    Consultant:  x ED MD   _______________________________________________________________________  Recommended Disposition:   Home with Family x   HH/PT/OT/RN    SNF/LTC  [facility]    ________________________________________________________________________  TOTAL TIME:  60 Minutes    I have provided a total of 60 minutes of critical care time.  During this entire length of time I was immediately available to the patient. The reason for providing this level of medical care was due to a critical illness that impaired one or more vital organ systems, such that there was a high probability of imminent or life threatening deterioration in the patient's condition. This care involved high complexity decision making which includes reviewing the patient's past medical records, current laboratory results, and actual Xray films in order to assess, support vital system function, and to treat this degree of vital organ system failure, and to prevent further life threatening deterioration of the patient's condition.        Comments   >50% of visit spent in counseling and coordination of care  Chart reviewed  Discussion with patient and/or family and questions answered     ________________________________________________________________________  Signed: Warren LITTIE Olp, MD        Procedures: see electronic medical records for all procedures/Xrays/labs and details which were not copied into this note but were reviewed prior to creation of Plan.

## 2024-02-11 NOTE — ED Notes (Signed)
 Primary nurse Erminio, RN notified of patients blood glucose of 702.

## 2024-02-11 NOTE — ED Triage Notes (Signed)
 Patient arrived to the ER with complaint of uncontrolled blood sugar and pain in her lower legs. Patient states that she is out of her medication and is homeless. She states that she has temporary housing provided by crisis.

## 2024-02-12 LAB — EKG 12-LEAD
Atrial Rate: 66 {beats}/min
P Axis: 63 degrees
P-R Interval: 158 ms
Q-T Interval: 440 ms
QRS Duration: 142 ms
QTc Calculation (Bazett): 461 ms
R Axis: -83 degrees
T Axis: 37 degrees
Ventricular Rate: 66 {beats}/min

## 2024-02-12 LAB — POCT GLUCOSE
POC Glucose: 210 mg/dL — ABNORMAL HIGH (ref 65–117)
POC Glucose: 269 mg/dL — ABNORMAL HIGH (ref 65–117)
POC Glucose: 304 mg/dL — ABNORMAL HIGH (ref 65–117)

## 2024-02-12 LAB — CBC WITH AUTO DIFFERENTIAL
Basophils %: 0.9 % (ref 0.0–1.0)
Basophils Absolute: 0.07 K/UL (ref 0.00–0.10)
Eosinophils %: 1.6 % (ref 0.0–7.0)
Eosinophils Absolute: 0.12 K/UL (ref 0.00–0.40)
Hematocrit: 35.5 % (ref 35.0–47.0)
Hemoglobin: 11.9 g/dL (ref 11.5–16.0)
Immature Granulocytes %: 1.5 % — ABNORMAL HIGH (ref 0.0–0.5)
Immature Granulocytes Absolute: 0.11 K/UL — ABNORMAL HIGH (ref 0.00–0.04)
Lymphocytes %: 30.2 % (ref 12.0–49.0)
Lymphocytes Absolute: 2.29 K/UL (ref 0.80–3.50)
MCH: 28.9 pg (ref 26.0–34.0)
MCHC: 33.5 g/dL (ref 30.0–36.5)
MCV: 86.2 FL (ref 80.0–99.0)
MPV: 9.7 FL (ref 8.9–12.9)
Monocytes %: 4.7 % — ABNORMAL LOW (ref 5.0–13.0)
Monocytes Absolute: 0.36 K/UL (ref 0.00–1.00)
Neutrophils %: 61.1 % (ref 32.0–75.0)
Neutrophils Absolute: 4.63 K/UL (ref 1.80–8.00)
Nucleated RBCs: 0 /100{WBCs}
Platelets: 221 K/uL (ref 150–400)
RBC: 4.12 M/uL (ref 3.80–5.20)
RDW: 13.2 % (ref 11.5–14.5)
WBC: 7.6 K/uL (ref 3.6–11.0)
nRBC: 0 K/uL (ref 0.00–0.01)

## 2024-02-12 LAB — BASIC METABOLIC PANEL
Anion Gap: 9 mmol/L (ref 2–14)
Anion Gap: 8 mmol/L (ref 2–14)
BUN/Creatinine Ratio: 13 (ref 12–20)
BUN/Creatinine Ratio: 12 (ref 12–20)
BUN: 7 mg/dL — ABNORMAL LOW (ref 8–23)
BUN: 7 mg/dL — ABNORMAL LOW (ref 8–23)
CO2: 26 mmol/L (ref 20–29)
CO2: 26 mmol/L (ref 20–29)
Calcium: 7.8 mg/dL — ABNORMAL LOW (ref 8.8–10.2)
Calcium: 7.8 mg/dL — ABNORMAL LOW (ref 8.8–10.2)
Chloride: 101 mmol/L (ref 98–107)
Chloride: 103 mmol/L (ref 98–107)
Creatinine: 0.57 mg/dL — ABNORMAL LOW (ref 0.60–1.00)
Creatinine: 0.59 mg/dL — ABNORMAL LOW (ref 0.60–1.00)
Est, Glom Filt Rate: >90 ml/min/1.73m2 (ref >90)
Est, Glom Filt Rate: >90 ml/min/1.73m2 (ref >90)
Glucose: 191 mg/dL — ABNORMAL HIGH (ref 65–100)
Glucose: 127 mg/dL — ABNORMAL HIGH (ref 65–100)
Potassium: 3.4 mmol/L — ABNORMAL LOW (ref 3.5–5.1)
Potassium: 3.5 mmol/L (ref 3.5–5.1)
Sodium: 136 mmol/L (ref 136–145)
Sodium: 136 mmol/L (ref 136–145)

## 2024-02-12 LAB — PHOSPHORUS
Phosphorus: 3.5 mg/dL (ref 2.5–4.5)
Phosphorus: 2.9 mg/dL (ref 2.5–4.5)

## 2024-02-12 LAB — MAGNESIUM
Magnesium: 1.8 mg/dL (ref 1.6–2.4)
Magnesium: 1.8 mg/dL (ref 1.6–2.4)

## 2024-02-12 LAB — BETA-HYDROXYBUTYRATE: Beta-Hydroxybutyrate: 0.08 mmol/L (ref <0.40)

## 2024-02-12 MED ORDER — INSULIN GLARGINE 100 UNIT/ML SC SOLN
100 | Freq: Every day | SUBCUTANEOUS | Status: DC
Start: 2024-02-12 — End: 2024-02-12
  Administered 2024-02-12: 12:00:00 35 [IU] via SUBCUTANEOUS

## 2024-02-12 MED ORDER — INSULIN LISPRO (1 UNIT DIAL) 100 UNIT/ML SC SOPN
100 | Freq: Three times a day (TID) | SUBCUTANEOUS | 0 refills | Status: AC
Start: 2024-02-12 — End: ?
  Filled 2024-02-12: qty 15, 42d supply, fill #0

## 2024-02-12 MED ORDER — BLOOD GLUCOSE TEST VI STRP
ORAL_STRIP | 0 refills | 33.00000 days | Status: DC
Start: 2024-02-12 — End: 2024-05-25

## 2024-02-12 MED ORDER — LEVOTHYROXINE SODIUM 125 MCG PO TABS
125 | ORAL_TABLET | Freq: Every day | ORAL | 1 refills | 90.00000 days | Status: DC
Start: 2024-02-12 — End: 2024-05-25

## 2024-02-12 MED ORDER — INSULIN LISPRO 100 UNIT/ML IJ SOLN
100 | Freq: Three times a day (TID) | INTRAMUSCULAR | Status: DC
Start: 2024-02-12 — End: 2024-02-12
  Administered 2024-02-12: 13:00:00 10 [IU] via SUBCUTANEOUS

## 2024-02-12 MED ORDER — LANCETS 30G MISC
0 refills | Status: AC
Start: 2024-02-12 — End: ?

## 2024-02-12 MED ORDER — FUROSEMIDE 40 MG PO TABS
40 | ORAL_TABLET | Freq: Every day | ORAL | 1 refills | 60.00000 days | Status: DC
Start: 2024-02-12 — End: 2024-05-25

## 2024-02-12 MED ORDER — EMPAGLIFLOZIN 25 MG PO TABS
25 | ORAL_TABLET | Freq: Every day | ORAL | 3 refills | 30.00000 days | Status: DC
Start: 2024-02-12 — End: 2024-05-25

## 2024-02-12 MED ORDER — POTASSIUM BICARB-CITRIC ACID 20 MEQ PO TBEF
20 | Freq: Once | ORAL | Status: AC
Start: 2024-02-12 — End: 2024-02-12
  Administered 2024-02-12: 06:00:00 20 meq via ORAL

## 2024-02-12 MED ORDER — ATORVASTATIN CALCIUM 40 MG PO TABS
40 | ORAL_TABLET | Freq: Every evening | ORAL | 1 refills | 90.00000 days | Status: DC
Start: 2024-02-12 — End: 2024-05-25

## 2024-02-12 MED ORDER — SERTRALINE HCL 50 MG PO TABS
50 | ORAL_TABLET | Freq: Every day | ORAL | 1 refills | 90.00000 days | Status: AC
Start: 2024-02-12 — End: ?

## 2024-02-12 MED ORDER — LOSARTAN POTASSIUM 50 MG PO TABS
50 | ORAL_TABLET | Freq: Every day | ORAL | 1 refills | 90.00000 days | Status: DC
Start: 2024-02-12 — End: 2024-05-25

## 2024-02-12 MED ORDER — BLOOD GLUCOSE MONITOR SYSTEM W/DEVICE KIT
PACK | 0 refills | 25.00000 days | Status: AC
Start: 2024-02-12 — End: ?

## 2024-02-12 MED ORDER — INSULIN PEN NEEDLE 32G X 4 MM MISC
Freq: Every day | 3 refills | 88.00000 days | Status: DC
Start: 2024-02-12 — End: 2024-05-25

## 2024-02-12 MED ORDER — LANTUS SOLOSTAR 100 UNIT/ML SC SOPN
100 | Freq: Every day | SUBCUTANEOUS | 0 refills | 50.00000 days | Status: DC
Start: 2024-02-12 — End: 2024-05-25

## 2024-02-12 MED ORDER — ASPIRIN 81 MG PO TBEC
81 | ORAL_TABLET | Freq: Every day | ORAL | 1 refills | 30.00000 days | Status: DC
Start: 2024-02-12 — End: 2024-05-25

## 2024-02-12 MED ORDER — INSULIN LISPRO 100 UNIT/ML IJ SOLN
100 | Freq: Three times a day (TID) | INTRAMUSCULAR | Status: DC
Start: 2024-02-12 — End: 2024-02-12
  Administered 2024-02-12: 17:00:00 12 [IU] via SUBCUTANEOUS

## 2024-02-12 MED ORDER — LINACLOTIDE 72 MCG PO CAPS
72 | ORAL_CAPSULE | Freq: Every day | ORAL | 1 refills | 30.00000 days | Status: AC
Start: 2024-02-12 — End: ?

## 2024-02-12 MED ORDER — CARVEDILOL 3.125 MG PO TABS
3.125 | ORAL_TABLET | Freq: Two times a day (BID) | ORAL | 1 refills | 30.00000 days | Status: DC
Start: 2024-02-12 — End: 2024-05-25

## 2024-02-12 MED ORDER — LEVOTHYROXINE SODIUM 25 MCG PO TABS
25 | Freq: Every day | ORAL | Status: DC
Start: 2024-02-12 — End: 2024-02-12

## 2024-02-12 MED FILL — HUMALOG KWIKPEN 100UNIT/ML SOPN: 100 100 UNIT/ML | SUBCUTANEOUS | 50 days supply | Qty: 15 | Fill #0 | Status: AC

## 2024-02-12 MED FILL — ACCU-CHEK SOFTCLIX LANCETS  MISC: 25 days supply | Qty: 100 | Fill #0 | Status: AC

## 2024-02-12 MED FILL — PEN NEEDLES 32G X 4 MM MISC: 90 days supply | Qty: 100 | Fill #0 | Status: AC

## 2024-02-12 MED FILL — CONTOUR NEXT GEN MONIT W/DEVICE KIT: 30 days supply | Qty: 1 | Fill #0 | Status: AC

## 2024-02-12 MED FILL — KCL IN DEXTROSE-NACL 20-5-0.45 MEQ/L-%-% IV SOLN: 20-5-0.45 MEQ/L-%-% | INTRAVENOUS | Qty: 1000 | Fill #0

## 2024-02-12 MED FILL — INSULIN LISPRO 100 UNIT/ML IJ SOLN: 100 [IU]/mL | INTRAMUSCULAR | Qty: 12 | Fill #0

## 2024-02-12 MED FILL — ATORVASTATIN CALCIUM 20 MG PO TABS: 20 mg | ORAL | Qty: 2 | Fill #0

## 2024-02-12 MED FILL — ASPIRIN LOW DOSE 81 MG PO TBEC: 81 mg | ORAL | Qty: 1 | Fill #0

## 2024-02-12 MED FILL — SERTRALINE HCL 50 MG PO TABS: 50 mg | ORAL | Qty: 1 | Fill #0

## 2024-02-12 MED FILL — HYDROMORPHONE HCL 1 MG/ML IJ SOLN: 1 mg/mL | INTRAMUSCULAR | Qty: 0.5 | Fill #0

## 2024-02-12 MED FILL — LANTUS 100 UNIT/ML SC SOLN: 100 [IU]/mL | SUBCUTANEOUS | Qty: 35 | Fill #0

## 2024-02-12 MED FILL — EFFER-K 20 MEQ PO TBEF: 20 meq | ORAL | Qty: 1 | Fill #0

## 2024-02-12 MED FILL — POTASSIUM CHLORIDE 10 MEQ/100ML IV SOLN: 10 MEQ/0ML | INTRAVENOUS | Qty: 100 | Fill #0

## 2024-02-12 MED FILL — INSULIN LISPRO 100 UNIT/ML IJ SOLN: 100 [IU]/mL | INTRAMUSCULAR | Qty: 6 | Fill #0

## 2024-02-12 MED FILL — LIDOCAINE PAIN RELIEF 4 % EX PTCH: 4 % | CUTANEOUS | Qty: 1 | Fill #0

## 2024-02-12 MED FILL — CARVEDILOL 3.125 MG PO TABS: 3.125 mg | ORAL | Qty: 1 | Fill #0

## 2024-02-12 MED FILL — INSULIN LISPRO 100 UNIT/ML IJ SOLN: 100 [IU]/mL | INTRAMUSCULAR | Qty: 2 | Fill #0

## 2024-02-12 MED FILL — LOSARTAN POTASSIUM 50 MG PO TABS: 50 mg | ORAL | Qty: 1 | Fill #0

## 2024-02-12 MED FILL — FUROSEMIDE 40 MG PO TABS: 40 mg | ORAL | Qty: 1 | Fill #0

## 2024-02-12 MED FILL — INSULIN LISPRO 100 UNIT/ML IJ SOLN: 100 [IU]/mL | INTRAMUSCULAR | Qty: 4 | Fill #0

## 2024-02-12 MED FILL — ENOXAPARIN SODIUM 40 MG/0.4ML IJ SOSY: 40 MG/0.4ML | INTRAMUSCULAR | Qty: 0.4 | Fill #0

## 2024-02-12 NOTE — Discharge Summary (Signed)
 Hospitalist Discharge Summary     Patient ID:  Katrina Bowen  [id number]  63 y.o.  [birth year]    Admit date: 02/11/2024    Discharge date and time: 02/12/2024    Admission Diagnoses: Moderate dehydration [E86.0]  Hyperglycemia without ketosis [R73.9]  Noncompliance with diabetes treatment [Z91.199]  Diabetic polyneuropathy associated with type 2 diabetes mellitus (HCC) [E11.42]  Hyperosmolar hyperglycemic state (HHS) (HCC) [E11.00]    Discharge Diagnoses:    Principal Problem:    Hyperosmolar hyperglycemic state (HHS) (HCC)  Active Problems:    Hyperglycemia without ketosis  Resolved Problems:    * No resolved hospital problems. *         Hospital Course:   Katrina Bowen is a 63 year old female with a history of T2DM, fibromyalgia, diabetic neuropathy, HTN, CHF, asthma, arthritis, hypothyroidism, CAD s/p PCI, unhoused, who is admitted for HHS.  She improved after insulin  drip, and was transition to subcutaneous insulin .  She was seen by diabetes education and a plan was made for diabetic regimen.     #Hyperosmolar Hyperglyemic State  #Uncontrolled T2DM   POA BG >600 with AG 16, glucose in the urine, no ketones.  Beta hydroxybutyrate negative.  Triggered by running out of insulin  x6 months. A1c 15.8%.  HHS resolved at time of discharge and patient was transitioned to subcutaneous insulin .  - Discharge plan per diabetes education, Lantus  35 units daily, Humalog  10 units with meals, Jardiance  25 mg daily, glucometer kit and supplies ordered     #Pseudohyponatremia  POA Na 131 but corrects to normal given hyperglycemia.  Resolved at time of discharge     #Hypokalemia  POA, acute, d/t HHS.  Resolved at time of discharge.    #Hypothyroidism  POA, chronic.  Had run out of Synthroid .  TSH here was 52.  -Restart Synthroid  at 125 mcg every morning  -Follow-up with PCP for monitoring of TSH     #Anemia  POA, acute, mild with hb 11.3. likely 2/2 chronic disease.  Hemoglobin normal at time of discharge.     #Non ACS elevated  troponin  POA, trop 28.3, flat on repeat. Has reproducible chest tenderness but no chest pressure. No need to trend further.     #Fibromyalgia  #Neuropathy  #Arthritis  #Fall prior to arrival  POA, chronic. Patient states most nerve pain medications don't work or has allergy to them. XR R shoulder NAP.     #CAD s/p PCI  #HFpEF  #HLD  #HTN  POA, patient reports h/o 7 stents. TEE 11/2020 with EF 60-65%, LVH, diastolic dysfx.  - continue asa, lipitor , coreg , lasix , losartan , refill sent of all of these since patient was out of all of them     #Unhoused  POA, patient has been living in motels. Has been unable to get her prescriptions, leading to this problem of HHS. Significant barrier to patient's care, health, and safe discharge.  - CM consulted  - I sent in all new prescriptions with refills     #BMI (Calculated): 28.8    Imaging  XR SHOULDER RIGHT (MIN 2 VIEWS)  Result Date: 02/11/2024  No acute fracture or dislocation. Electronically signed by JIMMY RABON    XR CHEST 1 VIEW  Result Date: 02/09/2024  No acute intrathoracic process is identified. Electronically signed by JIMMY RABON       PCP: None, None     Consults: endocrinology    Condition of patient at discharge: Improved and Stable    Discharge Exam:    Physical  Exam:    General: No acute distress. Alert. Cooperative.  Head: Normocephalic. Atraumatic.  Mouth: Mucosa pink. Moist mucous membranes.   Respiratory: CTAB. No increased work of breathing. Symmetric chest rise b/l.  Cardiovascular: RRR. No clubbing or cyanosis  GI: NTND, +BS. No masses.   Extremities: no LE edema. Moving all extremities spontaneously.  Musculoskeletal: Full ROM in all extremities, no obvious deformities. +peripheral pulses.  Skin: Warm, dry. No rashes.   Neuro: Alert, normal behavior. No focal deficit.       Disposition: home    Patient Instructions:   Current Discharge Medication List        START taking these medications    Details   Blood Glucose Monitoring Suppl (BLOOD GLUCOSE MONITOR  SYSTEM) w/Device KIT Pharmacist to identify preferred meter and strips.  Qty: 1 kit, Refills: 0  Start date: 02/12/2024      blood glucose monitor strips Test 2-3 times a day & as needed for symptoms of irregular blood glucose. Dispense sufficient amount for indicated testing frequency plus additional to accommodate PRN testing needs. Pharmacist to identify preferred brand.  Qty: 100 strip, Refills: 0  Start date: 02/12/2024      Lancets 30G MISC Test 2-3 times a day & as needed for symptoms of irregular blood glucose. Dispense sufficient amount for indicated testing frequency plus additional to accommodate PRN testing needs. Pharmacist to identify preferred brand.  Qty: 100 each, Refills: 0  Start date: 02/12/2024      Insulin  Pen Needle 32G X 4 MM MISC 1 each by Does not apply route daily  Qty: 100 each, Refills: 3  Start date: 02/12/2024      insulin  lispro, 1 Unit Dial , (HUMALOG  KWIKPEN) 100 UNIT/ML SOPN Inject 10 Units into the skin 3 times daily (before meals)  Qty: 5 Adjustable Dose Pre-filled Pen Syringe, Refills: 0  Start date: 02/12/2024           CONTINUE these medications which have CHANGED    Details   insulin  glargine (LANTUS  SOLOSTAR) 100 UNIT/ML injection pen Inject 35 Units into the skin daily  Qty: 10 Adjustable Dose Pre-filled Pen Syringe, Refills: 0  Start date: 02/12/2024      empagliflozin  (JARDIANCE ) 25 MG tablet Take 1 tablet by mouth daily  Qty: 30 tablet, Refills: 3  Start date: 02/12/2024      sertraline  (ZOLOFT ) 50 MG tablet Take 1 tablet by mouth daily  Qty: 30 tablet, Refills: 1  Start date: 02/13/2024      atorvastatin  (LIPITOR ) 40 MG tablet Take 1 tablet by mouth nightly  Qty: 30 tablet, Refills: 1  Start date: 02/12/2024      losartan  (COZAAR ) 50 MG tablet Take 1 tablet by mouth daily  Qty: 30 tablet, Refills: 1  Start date: 02/12/2024      carvedilol  (COREG ) 3.125 MG tablet Take 1 tablet by mouth 2 times daily (with meals)  Qty: 60 tablet, Refills: 1  Start date: 02/12/2024      furosemide   (LASIX ) 40 MG tablet Take 1 tablet by mouth daily  Qty: 30 tablet, Refills: 1  Start date: 02/12/2024      linaCLOtide  (LINZESS ) 72 MCG CAPS capsule Take 1 capsule by mouth every morning (before breakfast)  Qty: 30 capsule, Refills: 1  Start date: 02/12/2024      levothyroxine  (SYNTHROID ) 125 MCG tablet Take 1 tablet by mouth every morning (before breakfast)  Qty: 30 tablet, Refills: 1  Start date: 02/13/2024      aspirin  81  MG EC tablet Take 1 tablet by mouth daily  Qty: 30 tablet, Refills: 1  Start date: 02/12/2024           CONTINUE these medications which have NOT CHANGED    Details   ondansetron  (ZOFRAN -ODT) 4 MG disintegrating tablet Take 1 tablet by mouth 3 times daily as needed for Nausea or Vomiting  Qty: 21 tablet, Refills: 0      clopidogrel  (PLAVIX ) 75 MG tablet Take 1 tablet by mouth daily  Qty: 30 tablet, Refills: 0      albuterol  sulfate HFA (PROVENTIL  HFA) 108 (90 Base) MCG/ACT inhaler Inhale 2 puffs into the lungs every 4 hours as needed for Wheezing  Qty: 18 g, Refills: 2      naproxen  (NAPROSYN ) 500 MG tablet Take 1 tablet by mouth 2 times daily (with meals)  Qty: 20 tablet, Refills: 0      diclofenac sodium (VOLTAREN) 1 % GEL Apply 2 g topically 4 times daily      ibuprofen  (ADVIL ;MOTRIN ) 200 MG tablet Take 2 tablets by mouth every 6 hours as needed      acetaminophen  (TYLENOL ) 500 MG tablet Take 2 tablets by mouth every 6 hours as needed for Pain           Activity: activity as tolerated  Diet: diabetic diet  Wound Care: none needed    Follow-up with daily [facility] in 1 week  Follow-up tests/labs - monitor BG log, recheck TSH    Approximate time spent in patient care on day of discharge: 45 minutes    Signed:  Warren LITTIE Olp, MD  02/12/2024  12:23 PM

## 2024-02-12 NOTE — Care Coordination-Inpatient (Addendum)
 Round Trip arranged for patient with pick up request for 1500. Unit number provided for driver details and ETA. Patient provided order for diabetic shoe and [place] street sheet for homeless resources.    Rosina LOISE Matte, MS

## 2024-02-12 NOTE — Progress Notes (Signed)
 Pt doesn't have home supply of any meds. Linzess  unavailable.

## 2024-02-12 NOTE — Care Coordination-Inpatient (Signed)
 Care Management Initial Assessment  02/12/2024 11:30 AM  If patient is discharged prior to next notation, then this note serves as note for discharge by case management.    Reason for Admission:   Moderate dehydration [E86.0]  Hyperglycemia without ketosis [R73.9]  Noncompliance with diabetes treatment [Z91.199]  Diabetic polyneuropathy associated with type 2 diabetes mellitus (HCC) [E11.42]  Hyperosmolar hyperglycemic state (HHS) (HCC) [E11.00]         Patient Admission Status: Observation  Date Admitted to INP: NA  RUR: No data recorded  Hospitalization in the last 30 days (Readmission):  No        Advance Care Planning:  Code Status: Full Code  Primary Healthcare Decision Maker:     Advance Directive:      __________________________________________________________________________  Assessment:      02/12/24 1130   Service Assessment   Patient Orientation Alert and Oriented;Person;Place;Situation;Self   Cognition Alert   History Provided By Patient   Primary Caregiver Self   Support Systems Family Members   PCP Verified by CM No  (Does not have transportation to and from appts.)   Prior Functional Level Independent in ADLs/IADLs   Current Functional Level Independent in ADLs/IADLs   Can patient return to prior living arrangement Yes   Ability to make needs known: Good   Family able to assist with home care needs: No   Financial Resources   [facility] Medicare)   Social/Functional History   Lives With Family  (Mother, daughter, and grandson)   Type of Home Homeless  (Motel)   Home Equipment [place]   Prior Level of Assist for ADLs Independent   Prior Level of Assist for [employer] Independent   Ambulation Assistance Independent   Prior Level of Assist for Transfers Independent   Active Driver Yes   Mode of Engineer, water   Occupation On disability  ($1400 monthly)   Discharge Planning   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 No   Potential Assistance Purchasing Medications No  ([facility] [address]   Patient expects to be discharged to: Unknown  Energy manager)   Services At/After Discharge   Transition of Care Consult (CM Consult) N/A   Services At/After Discharge None   Mode of Transport at Discharge   (Uber/Lyft)   Confirm Follow Up Transport Self   Condition of Participation: Discharge Planning   The Plan for Transition of Care is related to the following treatment goals: Return to Motel with family         Comments: CM met with patient to inform of CM role and to assess needs. Patient reports that she is normally independent with a cane. She lives with her mother, daughter, and 31 yo autistic grandson. Patient moved from [place] over a year ago and has since then been staying at [employer]. She reports that she drives but she recently lost her car. She presented to [facility] earlier this week for low blood sugar and then went to [facility] for one night as she had a failure to appear charge. Patient reports she was released and had no way home. She ended up calling 911 and ended up at [facility] ED and later transferred here to [facility]. Patient is requesting a ride back to the [facility] where she has been for about a week and a half. Prior to there,  she was at a different motel. Patient has no transportation and therefore can not make it to Drs appts. She reports that she applied for Medicaid last year but was denied. She would like to apply again this year but has no way of applying. CM sent referral to [employer] to see if they could complete a screening before patient leaves today. CM also to provide additional homeless resources. CM will arrange transportation back to Motel when ready.    Discharge Concerns: [] Yes [x] No [] Unknown   Describe:    Financial concerns/barriers: [] Yes, explain: [x] No [] Unknown/Not  discussed  __________________________________________________________________________    Insurer:   Active Insurance as of 02/11/2024       Primary Coverage       Payor Plan Insurance Group Employer/Plan Group    [facility] MEDICARE [employer] [place] MEDICARE ADVANTAGE [phone]       Payor Plan Address Payor Plan Phone Number Payor Plan Fax Number Effective Dates    [address] [address] [phone]  09/29/2021 - None Entered    [place] [address]         Subscriber Name Subscriber Birth Date Member ID       Katrina Bowen,Katrina Bowen April 01, 1961 [id number]                     PCP: None, None   Address: None   Phone number: None    Pharmacy:   [facility] [address] [place] - [address] - [phone] [phone]  [address]  [place] [address]  Phone: [phone] Fax: [phone]    [facility] [address] [place] - [address] [place]  [address] - [place] [phone] - F [phone]  [address] [place]  [address]  [place] [address]  Phone: [phone] Fax: [phone]    DC Transport: (P) Self       Transition of care plan:    [] Unable to determine at this time. Awaiting clinical progress, and disposition recommendations.    [x]  Home. No assistance required.     []  Home. Pt refused recommended services.    []  Home with family assistance as needed, and outpatient follow-up.    []  Home with Outpatient PT and outpatient follow-up   Pt aware of OP appt? [] Yes, Provider:   [] Not scheduled   Transport provider:     []  Home with outpatient services.    Specify:    []  Home with Home Health   - Freedom of Choice offered? []  Yes, Preference:   []  NA    [] SNF/IPR   -[] Freedom of Choice offered, and preferences given:   [] Listing provided and preferences requested   -Status: [] Pending [] Accepted:    -Auth required: [] Yes [] No    -Auth initiated date:   -3 midnight stay required: [] Yes [] No  Date satisfied:     []  LTC:     []  Home with Hospice   - Freedom of Choice offered? []  Yes, Preference:   []   NA    []  Dispatch Health information provided.     []  Other:       Rosina SAILOR Charles-Bland  Case Management Department  For questions or concerns, please PerfectServe

## 2024-02-12 NOTE — Progress Notes (Addendum)
 OCCUPATIONAL THERAPY EVALUATION/DISCHARGE  Patient: Katrina Bowen (63 y.o. female)  Date: 02/12/2024  Primary Diagnosis: Moderate dehydration [E86.0]  Hyperglycemia without ketosis [R73.9]  Noncompliance with diabetes treatment [Z91.199]  Diabetic polyneuropathy associated with type 2 diabetes mellitus (HCC) [E11.42]  Hyperosmolar hyperglycemic state (HHS) (HCC) [E11.00]         Precautions:                    ASSESSMENT :  Based on the objective data below, the patient presents with good activity tolerance and receptive to instruction. Overall modified independent to independent for basic ADL's and functional mobility. Patient limited by inability to drive and make to appointments, lack of resources. Hx of diabetes and educated on foot care, optimal shoes due to fall risk and for wound prevention,  potential for diabetic shoes. No further needs in this setting    Further skilled acute occupational therapy is not indicated at this time.     PLAN :  Recommendations for mobility with staff: The patient is mobilizing safely with therapy using a DME for pt use: single point cane and recommend that nursing staff allows patient to mobilize ad lib using this device following discussion with primary shift nurse.    Recommendations for toileting with staff:  recommended toilet device: the bathroom.       Recommendation for discharge: (in order for the patient to meet his/her long term goals):   No skilled occupational therapy    Other factors to consider for discharge: discussed with case manager that she would benefit from info for Podiatrist and diabetic shoes    IF patient discharges home will need the following DME: none     SUBJECTIVE:   Patient stated, "My Mother is in better shape than me."    OBJECTIVE DATA SUMMARY:     Past Medical History:   Diagnosis Date    Arthritis     Asthma     CHF (congestive heart failure) (HCC)     Chronic pain     Diabetes mellitus (HCC)     Diverticulitis     Fibromyalgia     Heart attack  (HCC)     X2    History of ear infections     pt stated multiple    Hyperlipidemia     Hypertension     Hypothyroid     Neuropathy      Past Surgical History:   Procedure Laterality Date    CARDIAC CATHETERIZATION      x3 procedures; 7 stents    CESAREAN SECTION      COLONOSCOPY      COLONOSCOPY N/A 01/08/2022    COLONOSCOPY performed by Malvin Room, MD at [facility] ENDOSCOPY    EAR SURGERY      pt stated several sx    HYSTERECTOMY (CERVIX STATUS UNKNOWN)         Prior Level of Function/Environment/Context: unhoused and living with her Mother, daughter and autistic grandson in a [place] Used a cane for mobility at times.  a  , Prior Level of Assist for ADLs: Independent,  ,  ,  ,  ,  , Prior Level of Assist for Homemaking: Independent, Ambulation Assistance: Independent, Prior Level of Assist for Transfers: Independent, Active Driver: Yes     Expanded or extensive additional review of patient history:   Social/Functional History  Lives With: Family (Mother, daughter, and grandson)  Type of Home: Homeless (Motel)  Home Equipment: [place]  Prior Level  of Assist for ADLs: Independent  Prior Level of Assist for Homemaking: Independent  Prior Level of Assist for Transfers: Independent  Active Driver: Yes  Mode of Transportation: Car  Occupation: On disability ($1400 monthly)    Hand Dominance: right     EXAMINATION OF PERFORMANCE DEFICITS:    Cognitive/Behavioral Status:    Orientation  Orientation Level: Oriented X4  Cognition  Overall Cognitive Status: Exceptions  Arousal/Alertness: Appears intact  Following Commands: Appears intact  Attention Span: Appears intact  Memory: Appears intact  Safety Judgement: Impaired;Decreased awareness of need for assistance  Problem Solving: Assistance required to generate solutions  Insights: Decreased awareness of deficits  Initiation: Appears intact  Sequencing: Appears intact  Cognition Comment: needs increased education related to diabetes, foot care, diabetic shoes    Skin:  intact, noted pedicure and nail polish on toes    Edema: none noted    Hearing:    intact    Vision/Perceptual:    Vision - Basic Assessment  Prior Vision: Wears glasses only for reading  Patient Visual Report: No visual complaint reported.  Vision Comments: educated on benefit of decreased forward bending to prevent increased ocular pressure due to hx of uncontrolled diabetes        Perception  Overall Perceptual Status: WFL        Range of Motion:   AROM: Within functional limits         Strength:  Strength: Within functional limits      Coordination:  Coordination: Within functional limits            Tone & Sensation:   Tone: Normal  Sensation: Impaired (bilateral feet due to neuropathy, reported burning when sugar elevated)      Functional Mobility and Transfers for ADLs:  Bed Mobility:     Bed Mobility Training  Bed Mobility Training: Yes  Overall Level of Assistance: Independent  Interventions: Safety awareness training    Transfers:     Transfer Training  Transfer Training: Yes  Overall Level of Assistance: Modified independent  Interventions: Safety awareness training  Toilet Transfer: Modified independent                                   Balance:      Balance  Sitting: Intact  Standing: Intact      ADL Assessment:          Feeding: Independent       Grooming: Independent  Grooming Skilled Clinical Factors: standing at the sink                         UE Dressing: Independent       LE Dressing: Independent  LE Dressing Skilled Clinical Factors: able to cross LE's, very flexible, educated on benefit of crossing legs to reach feet versus forward bend    Toileting: Modified independent               Functional Mobility: Independent to modified independent with cane             ADL Intervention and task modifications:    Educated on role of OT    Educated on fall prevention related to:  -footwear and benefit of shoes around heel, avoid slides/flip-flops, loose crocs  -use of protective undergarment at night and  intentional toileting during the day to prevent rushing to the bathroom  - Educated  on slow to rise transfers with transition sup to standing  - safety awareness if feeling dizzy/lightheaded, benefit of ankle pumps, modification of task    Educated on foot care: washing, assessing for wounds daily, ideally Podiatrist for toenail management, need for diabetic shoes and potential to have covered by insurance          Pain Rating:  0/10   Pain Intervention(s):   pain is at a level acceptable to the patient    Activity Tolerance:   Good    After treatment:   Patient left in no apparent distress sitting up in chair and Call bell within reach    COMMUNICATION/EDUCATION:   The patient's plan of care was discussed with: physical therapist and registered nurse    Patient Education  Education Given To: Patient  Education Provided: Role of Therapy;ADL Adaptive Advertising copywriter;Fall Prevention Strategies (diabetic foot care)  Education Method: Demonstration;Verbal  Barriers to Learning: Lack of Family Support;Other (Comment) (lack of awareness of resources available)    Thank you for this referral.  Olam CHRISTELLA Parson, OTR/L  Minutes: 21

## 2024-02-12 NOTE — Progress Notes (Signed)
 PHYSICAL THERAPY EVALUATION/DISCHARGE    Patient: Katrina Bowen (63 y.o. female)  Date: 02/12/2024  Primary Diagnosis: Moderate dehydration [E86.0]  Hyperglycemia without ketosis [R73.9]  Noncompliance with diabetes treatment [Z91.199]  Diabetic polyneuropathy associated with type 2 diabetes mellitus (HCC) [E11.42]  Hyperosmolar hyperglycemic state (HHS) (HCC) [E11.00]       Precautions:              ASSESSMENT AND RECOMMENDATIONS:  Based on the objective data below, the patient the patient presents with no further acute or post-acute physical therapy needs, s/p admission for hyperglycemia. Patient is currently mobilizing at baseline Mod I with a straight cane. She has suffered one recent fall from tripping over a hazardous bed frame. Patient with some residual R shoulder pain but her ROM and strength are functional. Will complete PT order. Please consult if there is a change in status.       Functional Outcome Measure:  The patient scored 24 on the Ampac outcome measure which is indicative of reduced odds of requiring post acute SNF/IPR upon d/c .      Further skilled acute physical therapy is not indicated at this time.       PLAN :  Recommendation for discharge: (in order for the patient to meet his/her long term goals):   No skilled physical therapy    Other factors to consider for discharge: no additional factors    IF patient discharges home will need the following DME: patient owns DME required for discharge       SUBJECTIVE:   Patient agreeable and cooperative    OBJECTIVE DATA SUMMARY:     Past Medical History:   Diagnosis Date    Arthritis     Asthma     CHF (congestive heart failure) (HCC)     Chronic pain     Diabetes mellitus (HCC)     Diverticulitis     Fibromyalgia     Heart attack (HCC)     X2    History of ear infections     pt stated multiple    Hyperlipidemia     Hypertension     Hypothyroid     Neuropathy      Past Surgical History:   Procedure Laterality Date    CARDIAC CATHETERIZATION      x3  procedures; 7 stents    CESAREAN SECTION      COLONOSCOPY      COLONOSCOPY N/A 01/08/2022    COLONOSCOPY performed by Malvin Room, MD at [facility] ENDOSCOPY    EAR SURGERY      pt stated several sx    HYSTERECTOMY (CERVIX STATUS UNKNOWN)         Home Situation and Prior Level of Function: mod I  Social/Functional History  Lives With: Family (Mother, daughter, and grandson)  Type of Home: Homeless (Motel)  Home Equipment: Medical laboratory scientific officer  Prior Level of Assist for ADLs: Independent  Prior Level of Assist for [employer]: Independent  Prior Level of Assist for Transfers: Independent  Active Driver: Yes  Mode of Transportation: Set designer  Occupation: On disability ($1400 monthly)  Critical Behavior:  Orientation  Orientation Level: Oriented X4  Cognition  Overall Cognitive Status: Exceptions  Arousal/Alertness: Appears intact  Following Commands: Appears intact  Attention Span: Appears intact  Memory: Appears intact  Safety Judgement: Impaired;Decreased awareness of need for assistance  Problem Solving: Assistance required to generate solutions  Insights: Decreased awareness of deficits  Initiation: Appears intact  Sequencing: Appears intact  Cognition Comment:  needs increased education related to diabetes, foot care, diabetic shoes    Vision/Perceptual:    Vision - Basic Assessment  Prior Vision: Wears glasses only for reading  Patient Visual Report: No visual complaint reported.  Vision Comments: educated on benefit of decreased forward bending to prevent increased ocular pressure due to hx of uncontrolled diabetes        Perception  Overall Perceptual Status: WFL        Strength:    Strength: Within functional limits    Tone & Sensation:      Sensation: Impaired (Diabetic neuropathy to knees)    Coordination:  Coordination: Within functional limits    Range Of Motion:  AROM: Within functional limits       Functional Mobility:  Bed Mobility:     Bed Mobility Training  Bed Mobility Training: Yes  Overall Level of Assistance:  Independent  Interventions: Safety awareness training  Transfers:     Transfer Training  Transfer Training: Yes  Overall Level of Assistance: Modified independent  Interventions: Safety awareness training  Toilet Transfer: Modified independent  Balance:               Balance  Sitting: Intact  Standing: Intact  Ambulation/Gait Training:                       Gait  Gait Training: Yes  Overall Level of Assistance: Modified independent  Distance (ft): 150 Feet  Assistive Device: Gait belt;Cane, straight                                                                                                                                                                                                                                                                [employer] AM-PAC      Basic Mobility Inpatient Short Form (6-Clicks) Version 2    How much help is needed turning from your back to your side while in a flat bed without using bedrails?: None  How much help is needed moving from lying on your back to sitting on the side of a flat bed without using bedrails?: None  How much help is needed moving to and from a bed to a chair?: None  How much help is needed  standing up from a chair using your arms?: None  How much help is needed walking in hospital room?: None  How much help is needed climbing 3-5 steps with a railing?: None    AM-PAC Inpatient Mobility Raw Score : 24  AM-PAC Inpatient T-Scale Score : 61.14     Cutoff score <=171,2,3 had higher odds of discharging home with home health or need of SNF/IPR.    1. Diane U. Jette, Ronal Broody, Vinoth K. Ranganathan, Sandra D. Passek, Gilmore RAMAN. Waldemar Dale EMERSON Aneta.  Validity of the AM-PAC "6-Clicks" Inpatient Daily Activity and Basic Mobility Short Forms. Physical Therapy Mar 2014, 94 (3) 379-391; DOI: 10.2522/ptj.20130199  2. Warren M, Knecht J, Verheijde J, Tompkins J. Association of AM-PAC 6-Clicks Basic Mobility and Daily Activity Scores With Discharge  Destination. Phys Ther. 2021 Apr 4;101(4):pzab043. doi: 10.1093/ptj/pzab043. PMID: 66482536.  3. Herbold J, Rajaraman D, Waddell RAMAN, Agayby K, Katy S. Activity Measure for Post-Acute Care 6-Clicks Basic Mobility Scores Predict Discharge Destination After Acute Care Hospitalization in Select Patient Groups: A Retrospective, Observational Study. Arch Rehabil Res Clin Transl. 2022 Jul 16;4(3):100204. doi: 10.1016/j.arrct.7977.899795. PMID: 63876017; PMCID: EFR0517973.  4. Aneta DELENA Darryle RAMAN, Coster W, Ni P. AM-PAC Short Forms Manual 4.0. Revised 08/2018.                                                                                                                                                                                                                               Pain Rating:  0/10   Pain Intervention(s):       Activity Tolerance:   Good    After treatment:   Patient left in no apparent distress sitting up in chair, Call bell within reach, Bed/ chair alarm activated, and Updated patient's board on functional status and mobility recommendations      COMMUNICATION/EDUCATION:   The patient's plan of care was discussed with: occupational therapist and registered nurse    Patient Education  Education Given To: Patient  Education Provided: Role of Therapy;Plan of Care  Education Provided Comments: Diabetes mgmt  Education Method: Verbal  Barriers to Learning: None  Education Outcome: Verbalized understanding    Thank you for this referral.  Joesph Reels, PT  Minutes: 15      Physical Therapy Evaluation Charge Determination   History Examination Presentation Decision-Making   LOW Complexity : Zero comorbidities / personal factors that will impact the outcome / POC LOW Complexity :  1-2 Standardized tests and measures addressing body structure, function, activity limitation and / or participation in recreation  LOW Complexity : Stable, uncomplicated  AM-PAC  LOW      Based on the above components, the patient evaluation  is determined to be of the following complexity level: Low
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
## 2024-02-12 NOTE — Plan of Care (Signed)
 Problem: Chronic Conditions and Co-morbidities  Goal: Patient's chronic conditions and co-morbidity symptoms are monitored and maintained or improved  Outcome: Adequate for Discharge  Flowsheets (Taken 02/12/2024 0822)  Care Plan - Patient's Chronic Conditions and Co-Morbidity Symptoms are Monitored and Maintained or Improved:   Monitor and assess patient's chronic conditions and comorbid symptoms for stability, deterioration, or improvement   Collaborate with multidisciplinary team to address chronic and comorbid conditions and prevent exacerbation or deterioration   Update acute care plan with appropriate goals if chronic or comorbid symptoms are exacerbated and prevent overall improvement and discharge     Problem: Discharge Planning  Goal: Discharge to home or other facility with appropriate resources  Outcome: Adequate for Discharge  Flowsheets (Taken 02/12/2024 9177)  Discharge to home or other facility with appropriate resources: Identify barriers to discharge with patient and caregiver     Problem: Safety - Adult  Goal: Free from fall injury  Outcome: Adequate for Discharge  Flowsheets (Taken 02/12/2024 1116)  Free From Fall Injury:   Based on caregiver fall risk screen, instruct family/caregiver to ask for assistance with transferring infant if caregiver noted to have fall risk factors   Instruct family/caregiver on patient safety     Problem: Pain  Goal: Verbalizes/displays adequate comfort level or baseline comfort level  Outcome: Adequate for Discharge

## 2024-02-12 NOTE — Discharge Instructions (Signed)
 HOSPITALIST DISCHARGE INSTRUCTIONS  NAME:  Katrina Bowen   DOB:  [birth year]   MRN:  [id number]     Date/Time:  02/12/2024 11:54 AM    ADMIT DATE: 02/11/2024     DISCHARGE DATE: 02/12/2024     DISCHARGE DIAGNOSIS:  Dangerously high glucose    DISCHARGE INSTRUCTIONS:  Thank you for allowing us  to participate in your care. Your discharging Hospitalist is Katrina LITTIE Olp, MD. Katrina Bowen were admitted for evaluation and treatment of the above.  You improved with insulin  and IV fluids.  It will be important for you to take all of your medications as prescribed.  [facility] which [facility] a free [facility].      MEDICATIONS:    It is important that you take the medication exactly as they are prescribed.   Keep your medication in the bottles provided by the pharmacist and keep a list of the medication names, dosages, and times to be taken in your wallet.   Do not take other medications without consulting your doctor.             If you experience any of the following symptoms then please call your primary care physician or return to the emergency room if you cannot get hold of your doctor:  Fever, chills, nausea, vomiting, diarrhea, change in mentation, falling, bleeding, shortness of breath    Follow Up:  Please call the below provider to arrange hospital follow up appointment      [facility]  [address]  [place]  [address]  [phone]  Schedule an appointment as soon as possible for a visit          Information obtained by :  I understand that if any problems occur once I am at home I am to contact my physician.    I understand and acknowledge receipt of the instructions indicated above.                                                                                                                                           Physician's or R.N.'s Signature                                                                  Date/Time  Patient or Sales promotion account executive

## 2024-02-29 ENCOUNTER — Emergency Department: Admit: 2024-03-01 | Payer: Medicare (Managed Care)

## 2024-02-29 DIAGNOSIS — S0083XA Contusion of other part of head, initial encounter: Secondary | ICD-10-CM

## 2024-02-29 DIAGNOSIS — W19XXXA Unspecified fall, initial encounter: Principal | ICD-10-CM

## 2024-02-29 NOTE — ED Triage Notes (Signed)
 Pt tripped and fell walking around 2 hours ago.  Pt complaining of left sided face, hand, wrist, and knee pain.  Pt with no LOC.  Pt with no Thinners.      Pt took at 2200 800 mg Motrin  and 1 gram Tylenol 

## 2024-02-29 NOTE — ED Provider Notes (Shared)
 [facility]  EMERGENCY DEPARTMENT HISTORY AND PHYSICAL EXAM      Date of evaluation: 02/29/2024  Patient Name: Katrina Bowen Jan 12, 1961  MRN: [id number]  ED Provider: Suan JAYSON Leos, MD   Note Started: 12:35 AM EDT 03/01/24    HISTORY OF PRESENT ILLNESS     Chief Complaint   Patient presents with    Fall       History Provided By: Patient, only     HPI: Katrina Bowen is a 63 y.o. female with past medical history reviewed below presents with mechanical fall that occurred about 2 hours prior to arrival.  Patient reports that she was walking on uneven ground, tripped and fell forward.  She is complaining of pain to the left hand, left knee, left side of the face.  Denies LOC, nausea, vomiting, or blood thinners.  Patient took 800 mg of Motrin  and a gram of Tylenol  at 10 PM.    PAST MEDICAL HISTORY   Past Medical History:  Past Medical History:   Diagnosis Date    Arthritis     Asthma     CHF (congestive heart failure) (HCC)     Chronic pain     Diabetes mellitus (HCC)     Diverticulitis     Fibromyalgia     Heart attack (HCC)     X2    History of ear infections     pt stated multiple    Hyperlipidemia     Hypertension     Hypothyroid     Neuropathy        Past Surgical History:  Past Surgical History:   Procedure Laterality Date    CARDIAC CATHETERIZATION      x3 procedures; 7 stents    CESAREAN SECTION      COLONOSCOPY      COLONOSCOPY N/A 01/08/2022    COLONOSCOPY performed by Malvin Room, MD at [facility] ENDOSCOPY    EAR SURGERY      pt stated several sx    HYSTERECTOMY (CERVIX STATUS UNKNOWN)         Family History:  Family History   Problem Relation Age of Onset    No Known Problems Mother     No Known Problems Father        Social History:  Social History     Tobacco Use    Smoking status: Former     Types: Cigarettes    Smokeless tobacco: Never   Vaping Use    Vaping status: Never Used   Substance Use Topics    Alcohol use: Not Currently     Comment: occassional    Drug use: Yes     Types: Marijuana  (Weed)       Allergies:  Allergies   Allergen Reactions    Latex Rash     Use paper tape only    Amitriptyline Anaphylaxis and Other (See Comments)     Muscle Cramps      Corticosteroids Anaphylaxis     ALL steroids.         Penicillins Anaphylaxis     Has patient had a PCN reaction causing immediate rash, facial/tongue/throat swelling, SOB or lightheadedness with hypotension: {Yes  Has patient had a PCN reaction causing severe rash involving mucus membranes or skin necrosis: NO  Has patient had a PCN reaction that required hospitalization NO  Has patient had a PCN reaction occurring within the last 10 years: {Yes  If all of the above answers  are NO, then may proceed with Cephalosporin use.      Pregabalin Anaphylaxis, Shortness Of Breath and Swelling    Duloxetine Other (See Comments)     Other reaction(s): neurological reaction      Gabapentin Swelling       PCP: None, None    Current Meds:   No current facility-administered medications for this encounter.     Current Outpatient Medications   Medication Sig Dispense Refill    Blood Glucose Monitoring Suppl (BLOOD GLUCOSE MONITOR SYSTEM) w/Device KIT Pharmacist to identify preferred meter and strips. 1 kit 0    blood glucose monitor strips Test 2-3 times a day & as needed for symptoms of irregular blood glucose. Dispense sufficient amount for indicated testing frequency plus additional to accommodate PRN testing needs. Pharmacist to identify preferred brand. 100 strip 0    Lancets 30G MISC Test 2-3 times a day & as needed for symptoms of irregular blood glucose. Dispense sufficient amount for indicated testing frequency plus additional to accommodate PRN testing needs. Pharmacist to identify preferred brand. 100 each 0    insulin  glargine (LANTUS  SOLOSTAR) 100 UNIT/ML injection pen Inject 35 Units into the skin daily 10 Adjustable Dose Pre-filled Pen Syringe 0    Insulin  Pen Needle 32G X 4 MM MISC 1 each by Does not apply route daily 100 each 3    insulin  lispro,  1 Unit Dial , (HUMALOG  KWIKPEN) 100 UNIT/ML SOPN Inject 10 Units into the skin 3 times daily (before meals) 5 Adjustable Dose Pre-filled Pen Syringe 0    empagliflozin  (JARDIANCE ) 25 MG tablet Take 1 tablet by mouth daily 30 tablet 3    sertraline  (ZOLOFT ) 50 MG tablet Take 1 tablet by mouth daily 30 tablet 1    atorvastatin  (LIPITOR ) 40 MG tablet Take 1 tablet by mouth nightly 30 tablet 1    losartan  (COZAAR ) 50 MG tablet Take 1 tablet by mouth daily 30 tablet 1    carvedilol  (COREG ) 3.125 MG tablet Take 1 tablet by mouth 2 times daily (with meals) 60 tablet 1    furosemide  (LASIX ) 40 MG tablet Take 1 tablet by mouth daily 30 tablet 1    linaCLOtide  (LINZESS ) 72 MCG CAPS capsule Take 1 capsule by mouth every morning (before breakfast) 30 capsule 1    levothyroxine  (SYNTHROID ) 125 MCG tablet Take 1 tablet by mouth every morning (before breakfast) 30 tablet 1    aspirin  81 MG EC tablet Take 1 tablet by mouth daily 30 tablet 1    ondansetron  (ZOFRAN -ODT) 4 MG disintegrating tablet Take 1 tablet by mouth 3 times daily as needed for Nausea or Vomiting 21 tablet 0    clopidogrel  (PLAVIX ) 75 MG tablet Take 1 tablet by mouth daily 30 tablet 0    albuterol  sulfate HFA (PROVENTIL  HFA) 108 (90 Base) MCG/ACT inhaler Inhale 2 puffs into the lungs every 4 hours as needed for Wheezing 18 g 2    naproxen  (NAPROSYN ) 500 MG tablet Take 1 tablet by mouth 2 times daily (with meals) 20 tablet 0    diclofenac sodium (VOLTAREN) 1 % GEL Apply 2 g topically 4 times daily      ibuprofen  (ADVIL ;MOTRIN ) 200 MG tablet Take 2 tablets by mouth every 6 hours as needed      acetaminophen  (TYLENOL ) 500 MG tablet Take 2 tablets by mouth every 6 hours as needed for Pain         Social Determinants of Health:   Social Drivers of Health  Tobacco Use: Medium Risk (02/29/2024)    Patient History     Smoking Tobacco Use: Former     Smokeless Tobacco Use: Never     Passive Exposure: Not on file   Alcohol Use: Not At Risk (02/29/2024)    AUDIT-C      Frequency of Alcohol Consumption: Never     Average Number of Drinks: Patient does not drink     Frequency of Binge Drinking: Never   Financial Resource Strain: Not on file   Food Insecurity: Not on file (02/11/2024)   Recent Concern: Food Insecurity - Food Insecurity Present (02/11/2024)    Hunger Vital Sign     Worried About Programme researcher, broadcasting/film/video in the Last Year: Often true     Ran Out of Food in the Last Year: Often true   Transportation Needs: Unmet Transportation Needs (02/11/2024)    PRAPARE - Therapist, art (Medical): Yes     Lack of Transportation (Non-Medical): Yes   Physical Activity: Not on file   Stress: Not on file   Social Connections: Not on file   Intimate Partner Violence: Not on file   Depression: Not on file   Housing Stability: High Risk (02/11/2024)    Housing Stability Vital Sign     Unable to Pay for Housing in the Last Year: Yes     Number of Times Moved in the Last Year: 12     Homeless in the Last Year: Yes   Interpersonal Safety: Not At Risk (02/29/2024)    Interpersonal Safety Domain Source: IP Abuse Screening     Physical abuse: Denies     Verbal abuse: Denies     Emotional abuse: Denies     Financial abuse: Denies     Sexual abuse: Denies   Utilities: At Risk (02/11/2024)    [facility] Utilities     Threatened with loss of utilities: Already shut off     PHYSICAL EXAM   Physical Exam  Vitals and nursing note reviewed.   Constitutional:       Appearance: Normal appearance. She is not ill-appearing.   HENT:      Head: Normocephalic. No raccoon eyes, abrasion, contusion, masses, right periorbital erythema, left periorbital erythema or laceration.        Mouth/Throat:      Mouth: Mucous membranes are moist.      Pharynx: Oropharynx is clear.   Eyes:      General:         Right eye: No discharge.         Left eye: No discharge.      Extraocular Movements: Extraocular movements intact.      Pupils: Pupils are equal, round, and reactive to light.   Cardiovascular:      Rate and  Rhythm: Normal rate and regular rhythm.      Pulses: Normal pulses.      Heart sounds: Normal heart sounds.   Pulmonary:      Effort: Pulmonary effort is normal. No respiratory distress.      Breath sounds: Normal breath sounds.   Chest:      Chest wall: No tenderness.   Abdominal:      General: There is no distension.      Palpations: Abdomen is soft.      Tenderness: There is no abdominal tenderness.   Musculoskeletal:         General: No swelling, deformity or signs of injury. Normal  range of motion.      Left shoulder: No tenderness or bony tenderness. Normal range of motion.        Arms:         Hands:       Cervical back: Normal range of motion and neck supple. No tenderness.      Left knee: Tenderness present.        Legs:    Skin:     General: Skin is warm.      Capillary Refill: Capillary refill takes less than 2 seconds.   Neurological:      General: No focal deficit present.      Mental Status: She is alert and oriented to person, place, and time.   Psychiatric:         Mood and Affect: Mood normal.         Thought Content: Thought content normal.         SCREENINGS                No data recorded       LAB, EKG AND DIAGNOSTIC RESULTS   Labs:  No results found for this or any previous visit (from the past 12 hours).    EKG:.Not Applicable     Radiologic Studies:  Radiographic images are visualized and preliminarily interpreted by the ED Provider with the following findings: {Wet Read interpretation:58353}.     Interpretation per the Radiologist below, if available at the time of this note:  XR HAND LEFT (MIN 3 VIEWS)   Final Result      1. Artifact versus nondisplaced fracture of the base of the left hand fifth   metacarpal. Correlate with site of pain.   2. Left knee mild osteoarthritis. No fracture.         Electronically signed by Marolyn Boroughs      XR KNEE LEFT (3 VIEWS)   Final Result      1. Artifact versus nondisplaced fracture of the base of the left hand fifth   metacarpal. Correlate with site of  pain.   2. Left knee mild osteoarthritis. No fracture.         Electronically signed by Marolyn Boroughs      CT MAXILLOFACIAL WO CONTRAST    (Results Pending)        Records Reviewed:     MEDICAL DECISION MAKING and ED COURSE   12:35 AM Differential and Considerations of tests not ordered: Patient presents after fall with *** pain.  Will get imaging to further evaluate for fracture vs. Dislocation vs. Contusion.  Will treat with analgesics at this time and continue to monitor for changes in mentation.  Considered but not ordered CBC, CMP due to low suspicion for anemia, sepsis, electrolyte deficiencies, renal injury.        Vitals:    Vitals:    02/29/24 2330 02/29/24 2335 03/01/24 0030   BP: 136/76  (!) 109/58   Pulse:  85    Resp: 20     Temp: 98 F (36.7 C)     TempSrc: Oral     SpO2: 97%  94%       ED COURSE       Clinical Management Tools:  {CMT List:60667::Not Applicable}    Smoking Cessation: Not Applicable    Patient was given the following medications:  Medications - No data to display    CONSULTS: See ED Course/MDM for further details.  None     PROCEDURES  Unless otherwise noted above, none  Procedures      SEPSIS REASSESSMENT & CRITICAL CARE TIME   SEPSIS REASSESSMENT: No suspicion of bacterial infection and not having 2 SIRS during this visit.    Patient does not meet Critical Care Time, 0 minutes  CLINICAL IMPRESSIONS   No diagnosis found.   SDOH/DISPOSITION/PLAN   Social Determinants affecting Treatment Plan: Patient lacks a PCP. Given PCP/Free clinic resources.    DISPOSITION               Discharge Note: The patient is stable for discharge home. The signs, symptoms, diagnosis, and discharge instructions have been discussed, understanding conveyed, and agreed upon. The patient is to follow up as recommended or return to ER should their symptoms worsen.      PATIENT REFERRED TO:  No follow-up provider specified.      DISCHARGE MEDICATIONS:     Medication List        CONTINUE taking these medications       Blood Glucose Monitor System w/Device Kit  Pharmacist to identify preferred meter and strips.     Insulin  Pen Needle 32G X 4 MM Misc  1 each by Does not apply route daily     Lancets 30G Misc  Test 2-3 times a day & as needed for symptoms of irregular blood glucose. Dispense sufficient amount for indicated testing frequency plus additional to accommodate PRN testing needs. Pharmacist to identify preferred brand.            ASK your doctor about these medications      albuterol  sulfate HFA 108 (90 Base) MCG/ACT inhaler  Commonly known as: Proventil  HFA  Inhale 2 puffs into the lungs every 4 hours as needed for Wheezing     aspirin  81 MG EC tablet  Take 1 tablet by mouth daily     atorvastatin  40 MG tablet  Commonly known as: LIPITOR   Take 1 tablet by mouth nightly     blood glucose test strips  Test 2-3 times a day & as needed for symptoms of irregular blood glucose. Dispense sufficient amount for indicated testing frequency plus additional to accommodate PRN testing needs. Pharmacist to identify preferred brand.     carvedilol  3.125 MG tablet  Commonly known as: COREG   Take 1 tablet by mouth 2 times daily (with meals)     clopidogrel  75 MG tablet  Commonly known as: PLAVIX   Take 1 tablet by mouth daily     diclofenac sodium 1 % Gel  Commonly known as: VOLTAREN     empagliflozin  25 MG tablet  Commonly known as: Jardiance   Take 1 tablet by mouth daily     furosemide  40 MG tablet  Commonly known as: LASIX   Take 1 tablet by mouth daily     ibuprofen  200 MG tablet  Commonly known as: ADVIL ;MOTRIN      insulin  lispro (1 Unit Dial ) 100 UNIT/ML Sopn  Commonly known as: HumaLOG  KwikPen  Inject 10 Units into the skin 3 times daily (before meals)     Lantus  SoloStar 100 UNIT/ML injection pen  Generic drug: insulin  glargine  Inject 35 Units into the skin daily     levothyroxine  125 MCG tablet  Commonly known as: SYNTHROID   Take 1 tablet by mouth every morning (before breakfast)     linaCLOtide  72 MCG Caps capsule  Commonly  known as: LINZESS   Take 1 capsule by mouth every morning (before breakfast)     losartan  50 MG tablet  Commonly  known as: COZAAR   Take 1 tablet by mouth daily     naproxen  500 MG tablet  Commonly known as: NAPROSYN   Take 1 tablet by mouth 2 times daily (with meals)     ondansetron  4 MG disintegrating tablet  Commonly known as: ZOFRAN -ODT  Take 1 tablet by mouth 3 times daily as needed for Nausea or Vomiting     sertraline  50 MG tablet  Commonly known as: ZOLOFT   Take 1 tablet by mouth daily     TYLENOL  500 MG tablet  Generic drug: acetaminophen                 DISCONTINUED MEDICATIONS:  Current Discharge Medication List          I am the Primary Clinician of Record. Suan JAYSON Leos, MD (electronically signed)    (Please note that parts of this dictation were completed with voice recognition software. Quite often unanticipated grammatical, syntax, homophones, and other interpretive errors are inadvertently transcribed by the computer software. Please disregards these errors. Please excuse any errors that have escaped final proofreading.)

## 2024-03-01 ENCOUNTER — Emergency Department: Admit: 2024-03-01 | Payer: Medicare (Managed Care)

## 2024-03-01 ENCOUNTER — Inpatient Hospital Stay
Admit: 2024-03-01 | Discharge: 2024-03-01 | Disposition: A | Discharge status: Home / Self Care | Payer: Medicare (Managed Care) | Arrived: VH

## 2024-03-01 MED ORDER — IBUPROFEN 600 MG PO TABS
600 | ORAL_TABLET | Freq: Three times a day (TID) | ORAL | 0 refills | 15.00000 days | Status: DC | PRN
Start: 2024-03-01 — End: 2024-03-20

## 2024-03-01 MED ORDER — OXYCODONE HCL 5 MG PO TABS
5 | ORAL | Status: AC
Start: 2024-03-01 — End: 2024-03-01
  Administered 2024-03-01: 05:00:00 5 mg via ORAL

## 2024-03-01 MED ORDER — OXYCODONE-ACETAMINOPHEN 5-325 MG PO TABS
5-325 | ORAL_TABLET | Freq: Four times a day (QID) | ORAL | 0 refills | Status: AC | PRN
Start: 2024-03-01 — End: 2024-03-04

## 2024-03-04 NOTE — Telephone Encounter (Signed)
 Called patient unable to lvm to reschedule appt due to Dr Dietrich schedule change. Lmychart msg

## 2024-03-05 ENCOUNTER — Ambulatory Visit: Admit: 2024-03-05 | Payer: Medicare (Managed Care) | Attending: Adult Reconstructive Orthopaedic Surgery

## 2024-03-05 DIAGNOSIS — S62317A Displaced fracture of base of fifth metacarpal bone. left hand, initial encounter for closed fracture: Principal | ICD-10-CM

## 2024-03-05 MED ORDER — OXYCODONE-ACETAMINOPHEN 5-325 MG PO TABS
5-325 | ORAL_TABLET | Freq: Four times a day (QID) | ORAL | 0 refills | 5.00000 days | Status: AC | PRN
Start: 2024-03-05 — End: 2024-03-12

## 2024-03-05 NOTE — Progress Notes (Signed)
 Identified pt with two pt identifiers (name and DOB). Reviewed chart in preparation for visit and have obtained necessary documentation.    Canyon Willow is a 63 y.o. female Follow-Up from Hospital ([facility])  .    Vitals:    03/05/24 1012   Weight: 71.2 kg (157 lb)          1. Have you been to the ER, urgent care clinic since your last visit?  Hospitalized since your last visit?  Yes- Glucose levels     2. Have you seen or consulted any other health care providers outside of the [facility] since your last visit?  Include any pap smears or colon screening.  no

## 2024-03-05 NOTE — Progress Notes (Signed)
 03/05/2024      CC: Left hand pain    HPI:      This is a 63 y.o. year old female who struck their left hand four days ago.   The patient had the immediate onset of pain and deformity at the left hand at the small finger.  No open wounds, no other major complaints, no other wrist pain.  This patient is right hand dominant.        PMH:  Past Medical History:   Diagnosis Date    Arthritis     Asthma     CHF (congestive heart failure) (HCC)     Chronic pain     Diabetes mellitus (HCC)     Diverticulitis     Fibromyalgia     Heart attack (HCC)     X2    History of ear infections     pt stated multiple    Hyperlipidemia     Hypertension     Hypothyroid     Neuropathy        PSxHx:  Past Surgical History:   Procedure Laterality Date    CARDIAC CATHETERIZATION      x3 procedures; 7 stents    CESAREAN SECTION      COLONOSCOPY      COLONOSCOPY N/A 01/08/2022    COLONOSCOPY performed by Malvin Room, MD at [facility]    EAR SURGERY      pt stated several sx    HYSTERECTOMY (CERVIX STATUS UNKNOWN)         Meds:    Current Outpatient Medications:     oxyCODONE -acetaminophen  (PERCOCET) 5-325 MG per tablet, Take 1 tablet by mouth every 6 hours as needed for Pain for up to 7 days. Intended supply: 7 days. Take lowest dose possible to manage pain Max Daily Amount: 4 tablets, Disp: 28 tablet, Rfl: 0    ibuprofen  (ADVIL ;MOTRIN ) 600 MG tablet, Take 1 tablet by mouth 3 times daily as needed for Pain, Disp: 30 tablet, Rfl: 0    Blood Glucose Monitoring Suppl (BLOOD GLUCOSE MONITOR SYSTEM) w/Device KIT, Pharmacist to identify preferred meter and strips., Disp: 1 kit, Rfl: 0    blood glucose monitor strips, Test 2-3 times a day & as needed for symptoms of irregular blood glucose. Dispense sufficient amount for indicated testing frequency plus additional to accommodate PRN testing needs. Pharmacist to identify preferred brand., Disp: 100 strip, Rfl: 0    Lancets 30G MISC, Test 2-3 times a day & as needed for symptoms of irregular blood  glucose. Dispense sufficient amount for indicated testing frequency plus additional to accommodate PRN testing needs. Pharmacist to identify preferred brand., Disp: 100 each, Rfl: 0    insulin  glargine (LANTUS  SOLOSTAR) 100 UNIT/ML injection pen, Inject 35 Units into the skin daily, Disp: 10 Adjustable Dose Pre-filled Pen Syringe, Rfl: 0    Insulin  Pen Needle 32G X 4 MM MISC, 1 each by Does not apply route daily, Disp: 100 each, Rfl: 3    insulin  lispro, 1 Unit Dial , (HUMALOG  KWIKPEN) 100 UNIT/ML SOPN, Inject 10 Units into the skin 3 times daily (before meals), Disp: 5 Adjustable Dose Pre-filled Pen Syringe, Rfl: 0    empagliflozin  (JARDIANCE ) 25 MG tablet, Take 1 tablet by mouth daily, Disp: 30 tablet, Rfl: 3    sertraline  (ZOLOFT ) 50 MG tablet, Take 1 tablet by mouth daily, Disp: 30 tablet, Rfl: 1    atorvastatin  (LIPITOR ) 40 MG tablet, Take 1 tablet by mouth nightly, Disp: 30  tablet, Rfl: 1    losartan  (COZAAR ) 50 MG tablet, Take 1 tablet by mouth daily, Disp: 30 tablet, Rfl: 1    carvedilol  (COREG ) 3.125 MG tablet, Take 1 tablet by mouth 2 times daily (with meals), Disp: 60 tablet, Rfl: 1    furosemide  (LASIX ) 40 MG tablet, Take 1 tablet by mouth daily, Disp: 30 tablet, Rfl: 1    linaCLOtide  (LINZESS ) 72 MCG CAPS capsule, Take 1 capsule by mouth every morning (before breakfast), Disp: 30 capsule, Rfl: 1    levothyroxine  (SYNTHROID ) 125 MCG tablet, Take 1 tablet by mouth every morning (before breakfast), Disp: 30 tablet, Rfl: 1    aspirin  81 MG EC tablet, Take 1 tablet by mouth daily, Disp: 30 tablet, Rfl: 1    ondansetron  (ZOFRAN -ODT) 4 MG disintegrating tablet, Take 1 tablet by mouth 3 times daily as needed for Nausea or Vomiting, Disp: 21 tablet, Rfl: 0    clopidogrel  (PLAVIX ) 75 MG tablet, Take 1 tablet by mouth daily, Disp: 30 tablet, Rfl: 0    albuterol  sulfate HFA (PROVENTIL  HFA) 108 (90 Base) MCG/ACT inhaler, Inhale 2 puffs into the lungs every 4 hours as needed for Wheezing, Disp: 18 g, Rfl: 2     naproxen  (NAPROSYN ) 500 MG tablet, Take 1 tablet by mouth 2 times daily (with meals), Disp: 20 tablet, Rfl: 0    diclofenac sodium (VOLTAREN) 1 % GEL, Apply 2 g topically 4 times daily, Disp: , Rfl:     acetaminophen  (TYLENOL ) 500 MG tablet, Take 2 tablets by mouth every 6 hours as needed for Pain, Disp: , Rfl:     All:  Allergies   Allergen Reactions    Latex Rash     Use paper tape only    Amitriptyline Anaphylaxis and Other (See Comments)     Muscle Cramps      Corticosteroids Anaphylaxis     ALL steroids.         Penicillins Anaphylaxis     Has patient had a PCN reaction causing immediate rash, facial/tongue/throat swelling, SOB or lightheadedness with hypotension: {Yes  Has patient had a PCN reaction causing severe rash involving mucus membranes or skin necrosis: NO  Has patient had a PCN reaction that required hospitalization NO  Has patient had a PCN reaction occurring within the last 10 years: {Yes  If all of the above answers are NO, then may proceed with Cephalosporin use.      Pregabalin Anaphylaxis, Shortness Of Breath and Swelling    Duloxetine Other (See Comments)     Other reaction(s): neurological reaction      Gabapentin Swelling       Social Hx:  Social History     Socioeconomic History    Marital status: Widowed   Tobacco Use    Smoking status: Former     Types: Cigarettes    Smokeless tobacco: Never   Vaping Use    Vaping status: Never Used   Substance and Sexual Activity    Alcohol use: Not Currently     Comment: occassional    Drug use: Yes     Types: Marijuana Oda)    Sexual activity: Yes     Partners: Male     Social Drivers of Health     Food Insecurity:   Recent Concern: Food Insecurity - Food Insecurity Present (02/11/2024)    Hunger Vital Sign     Worried About Running Out of Food in the Last Year: Often true     Ran Out  of Food in the Last Year: Often true   Transportation Needs: Unmet Transportation Needs (02/11/2024)    PRAPARE - Therapist, art (Medical):  Yes     Lack of Transportation (Non-Medical): Yes   Housing Stability: High Risk (02/11/2024)    Housing Stability Vital Sign     Unable to Pay for Housing in the Last Year: Yes     Number of Times Moved in the Last Year: 12     Homeless in the Last Year: Yes       Family Hx:  Family History   Problem Relation Age of Onset    No Known Problems Mother     No Known Problems Father          Orthopedic Review of Systems:       Musculoskeletal: Left hand pain        Physical Examination:    There were no vitals filed for this visit.     General: AOX3, no apparent distress  Psychiatric: mood and affect appropriate  Lungs: breathing is symmetric and unlabored bilaterally  Heart: regular rate and rhythm  Abdomen: no guarding  Head: normocephalic, atraumatic  Skin: No significant abnormalities, good turgor  Sensation intact to light touch: C5-T1 dermatomes  Muscular exam: 5/5 strength in all major muscle groups unless noted in specialty exam.    Extremities      Left  upper extremity: Left hand indicates swelling and tenderness to palpation at the left hand fifth CMC area.  There is swelling and ecchymosis noted.  Range of motion of the small finger is limited secondary to pain, but the range of motion of the remaining fingers is unrestricted.  Sensation is intact to light touch in the Median, Radial, and Ulnar nerve distributions.  Capillary refill is less than 2 seconds in the fingers.  Wrist and elbow have no restriction to range of motion, no tenderness, and no other deformity or positive findings.    Right upper extremity:  Full active and passive range of motion without pain, deformity, no open wound, strength 5/5 in all major muscle groups.    Right lower extremity: Full active and passive range of motion without pain, deformity, no open wound, strength 5/5 in all major muscle groups.    Left lower extremity:  Full active and passive range of motion without pain, deformity, no open wound, strength 5/5 in all major  muscle groups.      Diagnostics:    Pertinent Xrays:  Xrays are ordered in the ED, independently reviewed by myself of the left hand which indicate a nondisplaced left fifth metacarpal base fracture with 0 degrees of angulation.  No other fractures, dislocation, or osseus abnormalities.      Assessment: Left hand 5th metacarpal fracture    Plan:    This patient has a 5th metacarpal fracture which is in acceptable alignment, non-operative management is the treatment of choice. We will place the patient in a boxer's brace.  I did discuss that the injury, as well as prolonged immobilization, can cause stiffness and dysfunction, skin issues, small finger crossover, and that any fracture can cause persistent pain, stiffness, swelling, and residual symptoms.    The patient will remain in immobilization for three weeks.  After those three weeks, we will review updated films and potentially remove restriction and potentially begin physical therapy exercises, if necessary.   I did explain to the patient that the deformity may persist, but functional outcomes  are largely unaffected by this level of displacement.      Patient is to return in 3 weeks for repeat x-rays of the left hand out of immobilization.      Prescription of oxycodone  given..    Ms. Waterson has a reminder for a due or due soon health maintenance. I have asked that she contact her primary care provider for follow-up on this health maintenance.

## 2024-03-20 ENCOUNTER — Emergency Department: Admit: 2024-03-20 | Payer: Medicare (Managed Care)

## 2024-03-20 ENCOUNTER — Inpatient Hospital Stay
Admit: 2024-03-20 | Discharge: 2024-03-20 | Disposition: A | Discharge status: Home / Self Care | Payer: Medicare (Managed Care) | Arrived: VH | Attending: Emergency Medicine

## 2024-03-20 DIAGNOSIS — S46912A Strain of unspecified muscle, fascia and tendon at shoulder and upper arm level, left arm, initial encounter: Principal | ICD-10-CM

## 2024-03-20 MED ORDER — ACETAMINOPHEN 500 MG PO TABS
500 | ORAL | Status: AC
Start: 2024-03-20 — End: 2024-03-20
  Administered 2024-03-20: 14:00:00 1000 mg via ORAL

## 2024-03-20 MED ORDER — KETOROLAC TROMETHAMINE 10 MG PO TABS
10 | ORAL_TABLET | Freq: Four times a day (QID) | ORAL | 0 refills | 5.00000 days | Status: DC | PRN
Start: 2024-03-20 — End: 2024-05-22

## 2024-03-20 MED ORDER — ACETAMINOPHEN 500 MG PO TABS
500 | ORAL_TABLET | Freq: Four times a day (QID) | ORAL | 0 refills | 14.00000 days | Status: DC | PRN
Start: 2024-03-20 — End: 2024-03-20

## 2024-03-20 MED ORDER — METHOCARBAMOL 750 MG PO TABS
750 | Freq: Once | ORAL | Status: AC
Start: 2024-03-20 — End: 2024-03-20
  Administered 2024-03-20: 14:00:00 750 mg via ORAL

## 2024-03-20 MED ORDER — KETOROLAC TROMETHAMINE 10 MG PO TABS
10 | ORAL_TABLET | Freq: Four times a day (QID) | ORAL | 0 refills | 5.00000 days | Status: DC | PRN
Start: 2024-03-20 — End: 2024-03-20

## 2024-03-20 MED ORDER — METHOCARBAMOL 1000 MG PO TABS
1000 | ORAL_TABLET | Freq: Four times a day (QID) | ORAL | 0 refills | 10.00000 days | Status: DC | PRN
Start: 2024-03-20 — End: 2024-03-20

## 2024-03-20 MED ORDER — METHOCARBAMOL 1000 MG PO TABS
1000 | ORAL_TABLET | Freq: Four times a day (QID) | ORAL | 0 refills | 10.00000 days | Status: AC | PRN
Start: 2024-03-20 — End: 2024-03-25

## 2024-03-20 MED ORDER — KETOROLAC TROMETHAMINE 30 MG/ML IJ SOLN
30 | INTRAMUSCULAR | Status: AC
Start: 2024-03-20 — End: 2024-03-20
  Administered 2024-03-20: 14:00:00 30 mg via INTRAMUSCULAR

## 2024-03-20 MED ORDER — ACETAMINOPHEN 500 MG PO TABS
500 | ORAL_TABLET | Freq: Four times a day (QID) | ORAL | 0 refills | 14.00000 days | Status: AC | PRN
Start: 2024-03-20 — End: ?

## 2024-03-20 MED FILL — ACETAMINOPHEN EXTRA STRENGTH 500 MG PO TABS: 500 mg | ORAL | Qty: 2 | Fill #0

## 2024-03-20 MED FILL — KETOROLAC TROMETHAMINE 30 MG/ML IJ SOLN: 30 mg/mL | INTRAMUSCULAR | Qty: 1 | Fill #0

## 2024-03-20 MED FILL — METHOCARBAMOL 750 MG PO TABS: 750 mg | ORAL | Qty: 1 | Fill #0

## 2024-03-20 NOTE — ED Triage Notes (Signed)
 [facility] pt was just seen here after sustaining a fall and evaluated with imaging of her face, left hand and knee however her left shoulder was not; pt stated that she has been having worsening pain since that injury and is here for it to be evaluated; c/o worsening left shoulder pain

## 2024-03-20 NOTE — ED Provider Notes (Signed)
 [facility]  EMERGENCY DEPARTMENT HISTORY AND PHYSICAL EXAM      Date of evaluation: 03/20/2024  Patient Name: Katrina Bowen [birth year]  MRN: [id number]  ED Provider: Cinderella LILLETTE Daria Rosey, MD   Note Started: 12:51 AM EDT 03/23/24    HISTORY OF PRESENT ILLNESS     Chief Complaint   Patient presents with    Shoulder Pain       History Provided By: Patient, only     HPI: Katrina Bowen is a 63 y.o. female with PMH medication as below comes to the ED for evaluation of shoulder pain.  Patient reports that she recently was seen here for a mechanical fall and had imaging of her lower extremity.  She however did not have any imaging for her shoulder and that is concerning her.  She is able to move her shoulder but has significant discomfort.  She would like to get imaging.  She is not having numbness or weakness.  Since she was discharged from the ED few days ago she has not had any other falls.  She has no other symptoms or concerns today.    PAST MEDICAL HISTORY   Past Medical History:  Past Medical History:   Diagnosis Date    Arthritis     Asthma     CHF (congestive heart failure) (HCC)     Chronic pain     Diabetes mellitus (HCC)     Diverticulitis     Fibromyalgia     Heart attack (HCC)     X2    History of ear infections     pt stated multiple    Hyperlipidemia     Hypertension     Hypothyroid     Neuropathy        Past Surgical History:  Past Surgical History:   Procedure Laterality Date    CARDIAC CATHETERIZATION      x3 procedures; 7 stents    CESAREAN SECTION      COLONOSCOPY      COLONOSCOPY N/A 01/08/2022    COLONOSCOPY performed by Malvin Room, MD at [facility] ENDOSCOPY    EAR SURGERY      pt stated several sx    HYSTERECTOMY (CERVIX STATUS UNKNOWN)         Family History:  Family History   Problem Relation Age of Onset    No Known Problems Mother     No Known Problems Father        Social History:  Social History     Tobacco Use    Smoking status: Former     Types: Cigarettes    Smokeless tobacco: Never    Vaping Use    Vaping status: Never Used   Substance Use Topics    Alcohol use: Not Currently     Comment: occassional    Drug use: Yes     Types: Marijuana (Weed)       Allergies:  Allergies   Allergen Reactions    Latex Rash     Use paper tape only    Amitriptyline Anaphylaxis and Other (See Comments)     Muscle Cramps      Corticosteroids Anaphylaxis     ALL steroids.         Penicillins Anaphylaxis     Has patient had a PCN reaction causing immediate rash, facial/tongue/throat swelling, SOB or lightheadedness with hypotension: {Yes  Has patient had a PCN reaction causing severe rash involving mucus membranes or skin  necrosis: NO  Has patient had a PCN reaction that required hospitalization NO  Has patient had a PCN reaction occurring within the last 10 years: {Yes  If all of the above answers are NO, then may proceed with Cephalosporin use.      Pregabalin Anaphylaxis, Shortness Of Breath and Swelling    Duloxetine Other (See Comments)     Other reaction(s): neurological reaction      Gabapentin Swelling       PCP: None, None    Current Meds:   No current facility-administered medications for this encounter.     Current Outpatient Medications   Medication Sig Dispense Refill    acetaminophen  (TYLENOL ) 500 MG tablet Take 2 tablets by mouth every 6 hours as needed for Pain 40 tablet 0    ketorolac  (TORADOL ) 10 MG tablet Take 1 tablet by mouth every 6 hours as needed for Pain 20 tablet 0    Methocarbamol  1000 MG TABS Take 1,000 mg by mouth 4 times daily as needed (Muscle spasms) 20 tablet 0    Blood Glucose Monitoring Suppl (BLOOD GLUCOSE MONITOR SYSTEM) w/Device KIT Pharmacist to identify preferred meter and strips. 1 kit 0    blood glucose monitor strips Test 2-3 times a day & as needed for symptoms of irregular blood glucose. Dispense sufficient amount for indicated testing frequency plus additional to accommodate PRN testing needs. Pharmacist to identify preferred brand. 100 strip 0    Lancets 30G MISC Test  2-3 times a day & as needed for symptoms of irregular blood glucose. Dispense sufficient amount for indicated testing frequency plus additional to accommodate PRN testing needs. Pharmacist to identify preferred brand. 100 each 0    insulin  glargine (LANTUS  SOLOSTAR) 100 UNIT/ML injection pen Inject 35 Units into the skin daily 10 Adjustable Dose Pre-filled Pen Syringe 0    Insulin  Pen Needle 32G X 4 MM MISC 1 each by Does not apply route daily 100 each 3    insulin  lispro, 1 Unit Dial , (HUMALOG  KWIKPEN) 100 UNIT/ML SOPN Inject 10 Units into the skin 3 times daily (before meals) 5 Adjustable Dose Pre-filled Pen Syringe 0    empagliflozin  (JARDIANCE ) 25 MG tablet Take 1 tablet by mouth daily 30 tablet 3    sertraline  (ZOLOFT ) 50 MG tablet Take 1 tablet by mouth daily 30 tablet 1    atorvastatin  (LIPITOR ) 40 MG tablet Take 1 tablet by mouth nightly 30 tablet 1    losartan  (COZAAR ) 50 MG tablet Take 1 tablet by mouth daily 30 tablet 1    carvedilol  (COREG ) 3.125 MG tablet Take 1 tablet by mouth 2 times daily (with meals) 60 tablet 1    furosemide  (LASIX ) 40 MG tablet Take 1 tablet by mouth daily 30 tablet 1    linaCLOtide  (LINZESS ) 72 MCG CAPS capsule Take 1 capsule by mouth every morning (before breakfast) 30 capsule 1    levothyroxine  (SYNTHROID ) 125 MCG tablet Take 1 tablet by mouth every morning (before breakfast) 30 tablet 1    aspirin  81 MG EC tablet Take 1 tablet by mouth daily 30 tablet 1    ondansetron  (ZOFRAN -ODT) 4 MG disintegrating tablet Take 1 tablet by mouth 3 times daily as needed for Nausea or Vomiting 21 tablet 0    clopidogrel  (PLAVIX ) 75 MG tablet Take 1 tablet by mouth daily 30 tablet 0    albuterol  sulfate HFA (PROVENTIL  HFA) 108 (90 Base) MCG/ACT inhaler Inhale 2 puffs into the lungs every 4 hours as needed for Wheezing  18 g 2    diclofenac sodium (VOLTAREN) 1 % GEL Apply 2 g topically 4 times daily         Social Determinants of Health:   Social Drivers of Health     Tobacco Use: Medium Risk  (02/29/2024)    Patient History     Smoking Tobacco Use: Former     Smokeless Tobacco Use: Never     Passive Exposure: Not on file   Alcohol Use: Not At Risk (03/20/2024)    AUDIT-C     Frequency of Alcohol Consumption: Never     Average Number of Drinks: Patient does not drink     Frequency of Binge Drinking: Never   Financial Resource Strain: Not on file   Food Insecurity: Not on file (02/11/2024)   Recent Concern: Food Insecurity - Food Insecurity Present (02/11/2024)    Hunger Vital Sign     Worried About Programme researcher, broadcasting/film/video in the Last Year: Often true     Ran Out of Food in the Last Year: Often true   Transportation Needs: Unmet Transportation Needs (02/11/2024)    PRAPARE - Therapist, art (Medical): Yes     Lack of Transportation (Non-Medical): Yes   Physical Activity: Not on file   Stress: Not on file   Social Connections: Not on file   Intimate Partner Violence: Not on file   Depression: Not at risk (03/05/2024)    PHQ-2     PHQ-2 Score: 0   Housing Stability: High Risk (02/11/2024)    Housing Stability Vital Sign     Unable to Pay for Housing in the Last Year: Yes     Number of Times Moved in the Last Year: 12     Homeless in the Last Year: Yes   Interpersonal Safety: Not At Risk (03/20/2024)    Interpersonal Safety Domain Source: IP Abuse Screening     Physical abuse: Denies     Verbal abuse: Denies     Emotional abuse: Denies     Financial abuse: Denies     Sexual abuse: Denies   Utilities: At Risk (02/11/2024)    [facility] Utilities     Threatened with loss of utilities: Already shut off     PHYSICAL EXAM   Physical Exam  Vitals and nursing note reviewed.   Constitutional:       Appearance: She is not ill-appearing, toxic-appearing or diaphoretic.   Cardiovascular:      Rate and Rhythm: Normal rate and regular rhythm.   Pulmonary:      Effort: Pulmonary effort is normal.      Breath sounds: Normal breath sounds.   Abdominal:      Palpations: Abdomen is soft.      Tenderness: There is no  abdominal tenderness.   Musculoskeletal:         General: Tenderness present. No swelling, deformity or signs of injury.   Neurological:      Mental Status: She is alert and oriented to person, place, and time.      Sensory: No sensory deficit.      Motor: No weakness.      Gait: Gait normal.         SCREENINGS                No data recorded       LAB, EKG AND DIAGNOSTIC RESULTS   Labs:  No results found for this or any previous visit (  from the past 12 hours).    EKG:.See ED Course Below    Radiologic Studies:  Radiographic images are visualized and preliminarily interpreted by the ED Provider with the following findings: See ED Course Below.     Interpretation per the Radiologist below, if available at the time of this note:  XR SHOULDER LEFT (MIN 2 VIEWS)   Final Result   No acute abnormality.      Electronically signed by Kathalene Christ           Records Reviewed: See ED Course    MEDICAL DECISION MAKING and ED COURSE   12:51 AM Differential and Considerations of tests not ordered: Patient is 63 year old female who comes to the ED for evaluation of shoulder pain secondary to a fall that she sustained a few days ago.  She has tenderness palpation.  There is no focal neurological deficits.  Sensation motor is within normal.  Pulses symmetrical.  She has no other symptoms or concerns.  X-ray was obtained which did not show evidence of acute findings.  Her symptoms improved administration of analgesics.  She is comfortable with the plan of discharge and outpatient follow-up.  Anticipatory guidance return precaution discussed.     Vitals:    Vitals:    03/20/24 0906 03/20/24 0908   BP: (!) 148/97    Pulse: 86    Resp: 17    Temp: 97.5 F (36.4 C)    SpO2: 100%    Weight:  71.7 kg (158 lb)       ED COURSE       Clinical Management Tools:  Not Applicable    Smoking Cessation: Not Applicable    Patient was given the following medications:  Medications   methocarbamol  (ROBAXIN ) tablet 750 mg (750 mg Oral Given 03/20/24  1020)   ketorolac  (TORADOL ) injection 30 mg (30 mg IntraMUSCular Given 03/20/24 1020)   acetaminophen  (TYLENOL ) tablet 1,000 mg (1,000 mg Oral Given 03/20/24 1020)       CONSULTS: See ED Course/MDM for further details.  None     PROCEDURES   Unless otherwise noted above, none  Procedures      SEPSIS REASSESSMENT & CRITICAL CARE TIME   SEPSIS REASSESSMENT: No suspicion of bacterial infection and not having 2 SIRS during this visit.    Patient does not meet Critical Care Time, 0 minutes  CLINICAL IMPRESSIONS     1. Strain of left shoulder, initial encounter       SDOH/DISPOSITION/PLAN   Social Determinants affecting Treatment Plan: None    DISPOSITION Decision To Discharge 03/20/2024 10:15:08 AM   DISPOSITION CONDITION Stable         Discharge Note: The patient is stable for discharge home. The signs, symptoms, diagnosis, and discharge instructions have been discussed, understanding conveyed, and agreed upon. The patient is to follow up as recommended or return to ER should their symptoms worsen.      PATIENT REFERRED TO:  [facility] Emergency Department  [address]  [place]  [phone]  [phone]  Go to   As needed, If symptoms worsen    PCP    Schedule an appointment as soon as possible for a visit in 3 days  For reevaluation, For follow-up        DISCHARGE MEDICATIONS:     Medication List        START taking these medications      acetaminophen  500 MG tablet  Commonly known as: TYLENOL   Take  2 tablets by mouth every 6 hours as needed for Pain     ketorolac  10 MG tablet  Commonly known as: TORADOL   Take 1 tablet by mouth every 6 hours as needed for Pain     Methocarbamol  1000 MG Tabs  Take 1,000 mg by mouth 4 times daily as needed (Muscle spasms)            CONTINUE taking these medications      Blood Glucose Monitor System w/Device Kit  Pharmacist to identify preferred meter and strips.     Insulin  Pen Needle 32G X 4 MM Misc  1 each by Does not apply route daily     Lancets 30G  Misc  Test 2-3 times a day & as needed for symptoms of irregular blood glucose. Dispense sufficient amount for indicated testing frequency plus additional to accommodate PRN testing needs. Pharmacist to identify preferred brand.            ASK your doctor about these medications      albuterol  sulfate HFA 108 (90 Base) MCG/ACT inhaler  Commonly known as: Proventil  HFA  Inhale 2 puffs into the lungs every 4 hours as needed for Wheezing     aspirin  81 MG EC tablet  Take 1 tablet by mouth daily     atorvastatin  40 MG tablet  Commonly known as: LIPITOR   Take 1 tablet by mouth nightly     blood glucose test strips  Test 2-3 times a day & as needed for symptoms of irregular blood glucose. Dispense sufficient amount for indicated testing frequency plus additional to accommodate PRN testing needs. Pharmacist to identify preferred brand.     carvedilol  3.125 MG tablet  Commonly known as: COREG   Take 1 tablet by mouth 2 times daily (with meals)     clopidogrel  75 MG tablet  Commonly known as: PLAVIX   Take 1 tablet by mouth daily     diclofenac sodium 1 % Gel  Commonly known as: VOLTAREN     empagliflozin  25 MG tablet  Commonly known as: Jardiance   Take 1 tablet by mouth daily     furosemide  40 MG tablet  Commonly known as: LASIX   Take 1 tablet by mouth daily     insulin  lispro (1 Unit Dial ) 100 UNIT/ML Sopn  Commonly known as: HumaLOG  KwikPen  Inject 10 Units into the skin 3 times daily (before meals)     Lantus  SoloStar 100 UNIT/ML injection pen  Generic drug: insulin  glargine  Inject 35 Units into the skin daily     levothyroxine  125 MCG tablet  Commonly known as: SYNTHROID   Take 1 tablet by mouth every morning (before breakfast)     linaCLOtide  72 MCG Caps capsule  Commonly known as: LINZESS   Take 1 capsule by mouth every morning (before breakfast)     losartan  50 MG tablet  Commonly known as: COZAAR   Take 1 tablet by mouth daily     ondansetron  4 MG disintegrating tablet  Commonly known as: ZOFRAN -ODT  Take 1 tablet by  mouth 3 times daily as needed for Nausea or Vomiting     sertraline  50 MG tablet  Commonly known as: ZOLOFT   Take 1 tablet by mouth daily               Where to Get Your Medications        These medications were sent to [id number], [place] - [address] - P [phone] - F [phone]  [address], [place] [address]      Phone: [phone]   acetaminophen  500 MG tablet  ketorolac  10 MG tablet  Methocarbamol  1000 MG Tabs           DISCONTINUED MEDICATIONS:  Discharge Medication List as of 03/20/2024 10:29 AM        STOP taking these medications       ibuprofen  (ADVIL ;MOTRIN ) 600 MG tablet Comments:   Reason for Stopping:         naproxen  (NAPROSYN ) 500 MG tablet Comments:   Reason for Stopping:               I am the Primary Clinician of Record. Alnita Aybar LILLETTE Daria Solo, MD (electronically signed)    (Please note that parts of this dictation were completed with voice recognition software. Quite often unanticipated grammatical, syntax, homophones, and other interpretive errors are inadvertently transcribed by the computer software. Please disregards these errors. Please excuse any errors that have escaped final proofreading.)     Al Hazmi, Amun Stemm I, MD  03/23/24 [phone]

## 2024-03-25 ENCOUNTER — Encounter: Payer: Medicare (Managed Care) | Attending: Adult Reconstructive Orthopaedic Surgery

## 2024-03-25 DIAGNOSIS — S62317A Displaced fracture of base of fifth metacarpal bone. left hand, initial encounter for closed fracture: Principal | ICD-10-CM

## 2024-04-08 ENCOUNTER — Encounter: Payer: Medicare (Managed Care) | Attending: Adult Reconstructive Orthopaedic Surgery

## 2024-04-08 DIAGNOSIS — S62317A Displaced fracture of base of fifth metacarpal bone. left hand, initial encounter for closed fracture: Principal | ICD-10-CM

## 2024-05-22 ENCOUNTER — Emergency Department: Admit: 2024-05-22 | Payer: Medicare (Managed Care)

## 2024-05-22 ENCOUNTER — Observation Stay
Admission: EM | Admit: 2024-05-22 | Discharge: 2024-05-26 | Disposition: A | Discharge status: Home / Self Care | Payer: Medicare (Managed Care)

## 2024-05-22 DIAGNOSIS — R079 Chest pain, unspecified: Secondary | ICD-10-CM

## 2024-05-22 DIAGNOSIS — I249 Acute ischemic heart disease, unspecified: Principal | ICD-10-CM

## 2024-05-22 LAB — CBC WITH AUTO DIFFERENTIAL
Basophils %: 1 % (ref 0.0–1.0)
Basophils Absolute: 0.08 K/UL (ref 0.00–0.10)
Eosinophils %: 2.1 % (ref 0.0–7.0)
Eosinophils Absolute: 0.17 K/UL (ref 0.00–0.40)
Hematocrit: 38.3 % (ref 35.0–47.0)
Hemoglobin: 12.9 g/dL (ref 11.5–16.0)
Immature Granulocytes %: 0.5 % (ref 0–0.5)
Immature Granulocytes Absolute: 0.04 K/UL (ref 0.00–0.04)
Lymphocytes %: 21.3 % (ref 12.0–49.0)
Lymphocytes Absolute: 1.75 K/UL (ref 0.80–3.50)
MCH: 29.7 pg (ref 26.0–34.0)
MCHC: 33.7 g/dL (ref 30.0–36.5)
MCV: 88.2 FL (ref 80.0–99.0)
MPV: 8.6 FL — ABNORMAL LOW (ref 8.9–12.9)
Monocytes %: 4.4 % — ABNORMAL LOW (ref 5.0–13.0)
Monocytes Absolute: 0.36 K/UL (ref 0.00–1.00)
Neutrophils %: 70.7 % (ref 32.0–75.0)
Neutrophils Absolute: 5.83 K/UL (ref 1.80–8.00)
Nucleated RBCs: 0 /100{WBCs}
Platelets: 202 K/uL (ref 150–400)
RBC: 4.34 M/uL (ref 3.80–5.20)
RDW: 12.3 % (ref 11.5–14.5)
WBC: 8.2 K/uL (ref 3.6–11.0)
nRBC: 0 K/uL (ref 0.00–0.01)

## 2024-05-22 LAB — COMPREHENSIVE METABOLIC PANEL
ALT: 25 U/L (ref 10–35)
AST: 35 U/L (ref 10–35)
Albumin/Globulin Ratio: 1.1 (ref 1.1–2.2)
Albumin: 3.6 g/dL (ref 3.5–5.2)
Alk Phosphatase: 61 U/L (ref 35–104)
Anion Gap: 12 mmol/L (ref 2–14)
BUN/Creatinine Ratio: 23 — ABNORMAL HIGH (ref 12–20)
BUN: 18 mg/dL (ref 8–23)
CO2: 21 mmol/L (ref 20–29)
Calcium: 9.1 mg/dL (ref 8.8–10.2)
Chloride: 100 mmol/L (ref 98–107)
Creatinine: 0.77 mg/dL (ref 0.60–1.00)
Est, Glom Filt Rate: 86 ml/min/1.73m2 (ref >59)
Globulin: 3.3 g/dL (ref 2.0–4.0)
Glucose: 331 mg/dL — ABNORMAL HIGH (ref 65–100)
Potassium: 5 mmol/L (ref 3.5–5.1)
Sodium: 134 mmol/L — ABNORMAL LOW (ref 136–145)
Total Bilirubin: 0.2 mg/dL (ref 0.0–1.2)
Total Protein: 6.8 g/dL (ref 6.4–8.3)

## 2024-05-22 LAB — BRAIN NATRIURETIC PEPTIDE: NT Pro-BNP: 123 pg/mL (ref 0–125)

## 2024-05-22 LAB — POCT GLUCOSE: POC Glucose: 307 mg/dL — ABNORMAL HIGH (ref 65–100)

## 2024-05-22 LAB — TROPONIN
Troponin T: 19.3 ng/L — ABNORMAL HIGH (ref 0–14)
Troponin T: 18.8 ng/L — ABNORMAL HIGH (ref 0–14)

## 2024-05-22 LAB — D-DIMER, QUANTITATIVE: D-Dimer, Quant: <0.27 ug{FEU}/mL (ref <0.50)

## 2024-05-22 MED ORDER — SODIUM CHLORIDE 0.9 % IV SOLN
0.9 | INTRAVENOUS | Status: DC | PRN
Start: 2024-05-22 — End: 2024-05-26

## 2024-05-22 MED ORDER — ACETAMINOPHEN 325 MG PO TABS
325 | Freq: Four times a day (QID) | ORAL | Status: DC | PRN
Start: 2024-05-22 — End: 2024-05-26
  Administered 2024-05-22: 21:00:00 650 mg via ORAL

## 2024-05-22 MED ORDER — LOSARTAN POTASSIUM 50 MG PO TABS
50 | Freq: Every day | ORAL | Status: DC
Start: 2024-05-22 — End: 2024-05-26
  Administered 2024-05-22 – 2024-05-26 (×5): 50 mg via ORAL

## 2024-05-22 MED ORDER — DEXTROSE 10 % IV BOLUS
INTRAVENOUS | Status: DC | PRN
Start: 2024-05-22 — End: 2024-05-26

## 2024-05-22 MED ORDER — NORMAL SALINE FLUSH 0.9 % IV SOLN
0.9 | INTRAVENOUS | Status: DC | PRN
Start: 2024-05-22 — End: 2024-05-26

## 2024-05-22 MED ORDER — INSULIN LISPRO 100 UNIT/ML IJ SOLN
100 | Freq: Four times a day (QID) | INTRAMUSCULAR | Status: DC
Start: 2024-05-22 — End: 2024-05-26
  Administered 2024-05-22: 22:00:00 6 [IU] via SUBCUTANEOUS
  Administered 2024-05-23: 22:00:00 2 [IU] via SUBCUTANEOUS
  Administered 2024-05-23 – 2024-05-24 (×4): 4 [IU] via SUBCUTANEOUS
  Administered 2024-05-24 – 2024-05-25 (×3): 2 [IU] via SUBCUTANEOUS
  Administered 2024-05-25 – 2024-05-26 (×3): 4 [IU] via SUBCUTANEOUS
  Administered 2024-05-26: 23:00:00 6 [IU] via SUBCUTANEOUS
  Administered 2024-05-26: 01:00:00 4 [IU] via SUBCUTANEOUS

## 2024-05-22 MED ORDER — METFORMIN HCL 500 MG PO TABS
500 | Freq: Two times a day (BID) | ORAL | Status: DC
Start: 2024-05-22 — End: 2024-05-23

## 2024-05-22 MED ORDER — MORPHINE SULFATE (PF) 2 MG/ML IV SOLN
2 | INTRAVENOUS | Status: DC
Start: 2024-05-22 — End: 2024-05-22

## 2024-05-22 MED ORDER — ACETAMINOPHEN 650 MG RE SUPP
650 | Freq: Four times a day (QID) | RECTAL | Status: DC | PRN
Start: 2024-05-22 — End: 2024-05-26

## 2024-05-22 MED ORDER — CARVEDILOL 3.125 MG PO TABS
3.125 | Freq: Two times a day (BID) | ORAL | Status: DC
Start: 2024-05-22 — End: 2024-05-26
  Administered 2024-05-22 – 2024-05-26 (×8): 3.125 mg via ORAL

## 2024-05-22 MED ORDER — ASPIRIN 81 MG PO CHEW
81 | ORAL | Status: AC
Start: 2024-05-22 — End: 2024-05-22
  Administered 2024-05-22: 19:00:00 243 mg via ORAL

## 2024-05-22 MED ORDER — ATORVASTATIN CALCIUM 40 MG PO TABS
40 | Freq: Every evening | ORAL | Status: DC
Start: 2024-05-22 — End: 2024-05-26
  Administered 2024-05-23 – 2024-05-26 (×4): 40 mg via ORAL

## 2024-05-22 MED ORDER — ENOXAPARIN SODIUM 40 MG/0.4ML IJ SOSY
40 | Freq: Every day | INTRAMUSCULAR | Status: DC
Start: 2024-05-22 — End: 2024-05-26
  Administered 2024-05-22 – 2024-05-24 (×3): 40 mg via SUBCUTANEOUS

## 2024-05-22 MED ORDER — DEXTROSE 10 % IV SOLN
10 | INTRAVENOUS | Status: DC | PRN
Start: 2024-05-22 — End: 2024-05-26

## 2024-05-22 MED ORDER — LEVOTHYROXINE SODIUM 100 MCG PO TABS
100 | Freq: Every day | ORAL | Status: DC
Start: 2024-05-22 — End: 2024-05-26
  Administered 2024-05-23 – 2024-05-26 (×4): 125 ug via ORAL

## 2024-05-22 MED ORDER — ONDANSETRON HCL 4 MG/2ML IJ SOLN
4 | Freq: Four times a day (QID) | INTRAMUSCULAR | Status: DC | PRN
Start: 2024-05-22 — End: 2024-05-26

## 2024-05-22 MED ORDER — MORPHINE SULFATE (PF) 2 MG/ML IV SOLN
2 | Freq: Four times a day (QID) | INTRAVENOUS | Status: DC | PRN
Start: 2024-05-22 — End: 2024-05-22
  Administered 2024-05-22: 21:00:00 2 mg via INTRAVENOUS

## 2024-05-22 MED ORDER — GLUCOSE 4 G PO CHEW
4 | ORAL | Status: DC | PRN
Start: 2024-05-22 — End: 2024-05-26

## 2024-05-22 MED ORDER — ASPIRIN 81 MG PO TBEC
81 | Freq: Every day | ORAL | Status: DC
Start: 2024-05-22 — End: 2024-05-26
  Administered 2024-05-23 – 2024-05-26 (×4): 81 mg via ORAL

## 2024-05-22 MED ORDER — ONDANSETRON 4 MG PO TBDP
4 | Freq: Three times a day (TID) | ORAL | Status: DC | PRN
Start: 2024-05-22 — End: 2024-05-26

## 2024-05-22 MED ORDER — GLUCAGON HCL (DIAGNOSTIC) 1 MG IJ SOLR
1 | INTRAMUSCULAR | Status: DC | PRN
Start: 2024-05-22 — End: 2024-05-26

## 2024-05-22 MED ORDER — NORMAL SALINE FLUSH 0.9 % IV SOLN
0.9 | Freq: Two times a day (BID) | INTRAVENOUS | Status: DC
Start: 2024-05-22 — End: 2024-05-26
  Administered 2024-05-23 – 2024-05-26 (×7): 10 mL via INTRAVENOUS

## 2024-05-22 MED FILL — DEXTROSE 10 % IV SOLN: 10 % | INTRAVENOUS | Qty: 1000 | Fill #0

## 2024-05-22 MED FILL — SODIUM CHLORIDE FLUSH 0.9 % IV SOLN: 0.9 % | INTRAVENOUS | Qty: 40 | Fill #0

## 2024-05-22 MED FILL — CARVEDILOL 3.125 MG PO TABS: 3.125 mg | ORAL | Qty: 1 | Fill #0

## 2024-05-22 MED FILL — LOSARTAN POTASSIUM 50 MG PO TABS: 50 mg | ORAL | Qty: 1 | Fill #0

## 2024-05-22 MED FILL — INSULIN LISPRO 100 UNIT/ML IJ SOLN: 100 [IU]/mL | INTRAMUSCULAR | Qty: 6 | Fill #0

## 2024-05-22 MED FILL — ENOXAPARIN SODIUM 40 MG/0.4ML IJ SOSY: 40 MG/0.4ML | INTRAMUSCULAR | Qty: 0.4 | Fill #0

## 2024-05-22 MED FILL — ASPIRIN LOW STRENGTH 81 MG PO CHEW: 81 mg | ORAL | Qty: 3 | Fill #0

## 2024-05-22 MED FILL — TYLENOL 325 MG PO TABS: 325 mg | ORAL | Qty: 2 | Fill #0

## 2024-05-22 MED FILL — MORPHINE SULFATE 2 MG/ML IJ SOLN: 2 mg/mL | INTRAMUSCULAR | Qty: 1 | Fill #0

## 2024-05-22 NOTE — Plan of Care (Signed)
"    Problem: Chronic Conditions and Co-morbidities  Goal: Patient's chronic conditions and co-morbidity symptoms are monitored and maintained or improved  Outcome: Progressing     Problem: Discharge Planning  Goal: Discharge to home or other facility with appropriate resources  Outcome: Progressing     Problem: Pain  Goal: Verbalizes/displays adequate comfort level or baseline comfort level  Outcome: Progressing     Problem: Safety - Adult  Goal: Free from fall injury  Outcome: Progressing     "

## 2024-05-22 NOTE — Care Coordination (Signed)
"     05/22/24 1547   Service Assessment   Information Provided By Patient   Patient Orientation Alert   Cognition No Apparent Deficit   Primary Caregiver Self   Support Systems Family Members   Patient's Healthcare Decision Maker is: Named in Scanned ACP Document   PCP Verified by CM No PCP   Prior Functional Level Independent in ADLs/IADLs   History of falls? 0   Current Functional Level at Time of Initial Assessment Independent in ADLs/IADLs   Can patient return to prior living arrangement Yes   Ability to make needs known: Good   Family able to assist with home care needs: Yes   Discharge Planning    Location Prior to Acute Admission Other (Comment)  (hotel)   Lives With Family   Current Services Prior To Admission None   Potential Assistance Obtaining Medications No   Patient expects to be discharged to: Home   Services At/After Discharge   Who will provide transportation at discharge? Other (see comment)  (tbd)     Advance Care Planning     General Advance Care Planning (ACP) Conversation    Date of Conversation: 05/22/2024  Conducted with: Patient with Decision Making Capacity  Other persons present: None    Healthcare Decision Maker: No healthcare decision makers have been documented.       Content/Action Overview:  Has ACP document(s) on file - reflects the patient's care preferences  Reviewed DNR/DNI and patient elects Full Code (Attempt Resuscitation)        Length of Voluntary ACP Conversation in minutes:  <16 minutes (Non-Billable)    Elspeth Daring         CM met with patient and reviewed chart. The patient lives in hotel with her  mother and grandson. Uses a cane prn. NO hh, home o2, hd, and uses [facility] in [place] for rx. TBD for dc transportation  "

## 2024-05-22 NOTE — ED Provider Notes (Signed)
 "[facility]  EMERGENCY DEPARTMENT HISTORY AND PHYSICAL EXAM      Date of evaluation: 05/22/2024  Patient Name: Katrina Bowen 01-28-1961  MRN: [id number]  ED Provider: Josette Barre, MD   Note Started: 2:37 PM EST 05/22/24  Chief Complaint:   Chief Complaint   Patient presents with    Chest Pain       HISTORY OF PRESENT ILLNESS   History Provided By: Patient, only     HPI: Katrina Bowen is a 63 y.o. female with extensive cardiac history with 7 stents per patient with last cardiac catheterization June 2022 with no intervention presents with acute onset chest pain and shortness of breath that began upon awakening around 7 AM. The pain is localized to the side of the chest and radiates to the back. The patient reports associated nausea but denies vomiting. There is no history of recent cough, congestion, or flu-like symptoms. No new swelling of the legs is noted, though the patient has baseline neuropathy-related swelling. The patient took baby aspirin  upon symptom onset. The patient is not currently on blood thinner. Does take aspirin  and plavix .    PAST MEDICAL HISTORY   Past Medical History:  Past Medical History:   Diagnosis Date    Arthritis     Asthma     CHF (congestive heart failure) (HCC)     Chronic pain     Diabetes mellitus (HCC)     Diverticulitis     Fibromyalgia     Heart attack (HCC)     X2    History of ear infections     pt stated multiple    Hyperlipidemia     Hypertension     Hypothyroid     Neuropathy        Past Surgical History:  Past Surgical History:   Procedure Laterality Date    CARDIAC CATHETERIZATION      x3 procedures; 7 stents    CESAREAN SECTION      COLONOSCOPY      COLONOSCOPY N/A 01/08/2022    COLONOSCOPY performed by Malvin Room, MD at [facility] ENDOSCOPY    EAR SURGERY      pt stated several sx    HYSTERECTOMY (CERVIX STATUS UNKNOWN)         Family History:  Family History   Problem Relation Age of Onset    No Known Problems Mother     No Known Problems Father         Social History:  Social History     Tobacco Use    Smoking status: Former     Types: Cigarettes    Smokeless tobacco: Never   Vaping Use    Vaping status: Never Used   Substance Use Topics    Alcohol use: Not Currently     Comment: occassional    Drug use: Yes     Types: Marijuana (Weed)       Allergies:  Allergies   Allergen Reactions    Latex Rash     Use paper tape only    Amitriptyline Anaphylaxis and Other (See Comments)     Muscle Cramps      Corticosteroids Anaphylaxis     ALL steroids.         Penicillins Anaphylaxis     Has patient had a PCN reaction causing immediate rash, facial/tongue/throat swelling, SOB or lightheadedness with hypotension: {Yes  Has patient had a PCN reaction causing severe rash involving mucus membranes or skin necrosis:  NO  Has patient had a PCN reaction that required hospitalization NO  Has patient had a PCN reaction occurring within the last 10 years: {Yes  If all of the above answers are NO, then may proceed with Cephalosporin use.      Pregabalin Anaphylaxis, Shortness Of Breath and Swelling    Duloxetine Other (See Comments)     Other reaction(s): neurological reaction      Gabapentin Swelling       PCP: No, Pcp    Current Meds:   Current Facility-Administered Medications   Medication Dose Route Frequency Provider Last Rate Last Admin    [START ON 05/23/2024] aspirin  EC tablet 81 mg  81 mg Oral Daily Kondragunta, Sakuntla, MD        atorvastatin  (LIPITOR ) tablet 40 mg  40 mg Oral Nightly Kondragunta, Sakuntla, MD        carvedilol  (COREG ) tablet 3.125 mg  3.125 mg Oral BID WC Kondragunta, Sakuntla, MD   3.125 mg at 05/22/24 1600    [START ON 05/23/2024] levothyroxine  (SYNTHROID ) tablet 125 mcg  125 mcg Oral QAM AC Kondragunta, Sakuntla, MD        losartan  (COZAAR ) tablet 50 mg  50 mg Oral Daily Kondragunta, Sakuntla, MD   50 mg at 05/22/24 1719    insulin  lispro (HUMALOG ,ADMELOG ) injection vial 0-8 Units  0-8 Units SubCUTAneous 4x Daily AC & HS Kondragunta, Sakuntla, MD    6 Units at 05/22/24 1719    glucose chewable tablet 16 g  4 tablet Oral PRN Kondragunta, Sakuntla, MD        dextrose  bolus 10% 125 mL  125 mL IntraVENous PRN Kondragunta, Sakuntla, MD        Or    dextrose  bolus 10% 250 mL  250 mL IntraVENous PRN Kondragunta, Sakuntla, MD        glucagon  injection 1 mg  1 mg SubCUTAneous PRN Kondragunta, Sakuntla, MD        dextrose  10 % infusion   IntraVENous Continuous PRN Kondragunta, Sakuntla, MD        sodium chloride  flush 0.9 % injection 5-40 mL  5-40 mL IntraVENous 2 times per day Kondragunta, Sakuntla, MD        sodium chloride  flush 0.9 % injection 5-40 mL  5-40 mL IntraVENous PRN Kondragunta, Sakuntla, MD        0.9 % sodium chloride  infusion   IntraVENous PRN Kondragunta, Sakuntla, MD        ondansetron  (ZOFRAN -ODT) disintegrating tablet 4 mg  4 mg Oral Q8H PRN Kondragunta, Sakuntla, MD        Or    ondansetron  (ZOFRAN ) injection 4 mg  4 mg IntraVENous Q6H PRN Kondragunta, Sakuntla, MD        acetaminophen  (TYLENOL ) tablet 650 mg  650 mg Oral Q6H PRN Kondragunta, Sakuntla, MD   650 mg at 05/22/24 1600    Or    acetaminophen  (TYLENOL ) suppository 650 mg  650 mg Rectal Q6H PRN Kondragunta, Sakuntla, MD        enoxaparin  (LOVENOX ) injection 40 mg  40 mg SubCUTAneous Daily Kondragunta, Sakuntla, MD   40 mg at 05/22/24 1719    metFORMIN  (GLUCOPHAGE ) tablet 500 mg  500 mg Oral BID WC Kondragunta, Sakuntla, MD        morphine  (PF) injection 2 mg  2 mg IntraVENous Q6H PRN Kondragunta, Sakuntla, MD   2 mg at 05/22/24 1600     PHYSICAL EXAM   Physical Exam  Constitutional:  Appearance: Normal appearance. She is not ill-appearing.   HENT:      Head: Normocephalic and atraumatic.      Nose: Nose normal.      Mouth/Throat:      Mouth: Mucous membranes are moist.      Pharynx: Oropharynx is clear.   Eyes:      Extraocular Movements: Extraocular movements intact.      Pupils: Pupils are equal, round, and reactive to light.   Cardiovascular:      Rate and Rhythm: Normal rate and  regular rhythm.      Pulses: Normal pulses.      Heart sounds: Normal heart sounds.   Pulmonary:      Effort: Pulmonary effort is normal. No respiratory distress.      Breath sounds: Normal breath sounds.   Abdominal:      Palpations: Abdomen is soft.      Tenderness: There is no abdominal tenderness.   Musculoskeletal:         General: No swelling or tenderness. Normal range of motion.      Cervical back: Normal range of motion and neck supple.   Skin:     General: Skin is warm and dry.      Capillary Refill: Capillary refill takes less than 2 seconds.   Neurological:      General: No focal deficit present.      Mental Status: She is alert and oriented to person, place, and time.   Psychiatric:         Mood and Affect: Mood normal.       SCREENINGS   History: 1  ECG: 1  Patient Age: 72  *Risk factors for Atherosclerotic disease: Diabetes Mellitus; Hypercholesterolemia; Hypertension; Coronary Artery Disease  Risk Factors: 2  Troponin: 1  Heart Score Total: 6              No data recorded       LAB, EKG AND DIAGNOSTIC RESULTS   Labs:  Recent Results (from the past 12 hours)   CBC with Auto Differential    Collection Time: 05/22/24  1:45 PM   Result Value Ref Range    WBC 8.2 3.6 - 11.0 K/uL    RBC 4.34 3.80 - 5.20 M/uL    Hemoglobin 12.9 11.5 - 16.0 g/dL    Hematocrit 61.6 64.9 - 47.0 %    MCV 88.2 80.0 - 99.0 FL    MCH 29.7 26.0 - 34.0 PG    MCHC 33.7 30.0 - 36.5 g/dL    RDW 87.6 88.4 - 85.4 %    Platelets 202 150 - 400 K/uL    MPV 8.6 (L) 8.9 - 12.9 FL    Nucleated RBCs 0.0 0.0 PER 100 WBC    nRBC 0.00 0.00 - 0.01 K/uL    Neutrophils % 70.7 32.0 - 75.0 %    Lymphocytes % 21.3 12.0 - 49.0 %    Monocytes % 4.4 (L) 5.0 - 13.0 %    Eosinophils % 2.1 0.0 - 7.0 %    Basophils % 1.0 0.0 - 1.0 %    Immature Granulocytes % 0.5 0 - 0.5 %    Neutrophils Absolute 5.83 1.80 - 8.00 K/UL    Lymphocytes Absolute 1.75 0.80 - 3.50 K/UL    Monocytes Absolute 0.36 0.00 - 1.00 K/UL    Eosinophils Absolute 0.17 0.00 - 0.40 K/UL     Basophils Absolute 0.08 0.00 - 0.10 K/UL    Immature Granulocytes  Absolute 0.04 0.00 - 0.04 K/UL    Differential Type AUTOMATED     Troponin    Collection Time: 05/22/24  1:45 PM   Result Value Ref Range    Troponin T 19.3 (H) 0 - 14 ng/L   Comprehensive Metabolic Panel    Collection Time: 05/22/24  1:45 PM   Result Value Ref Range    Sodium 134 (L) 136 - 145 mmol/L    Potassium 5.0 3.5 - 5.1 mmol/L    Chloride 100 98 - 107 mmol/L    CO2 21 20 - 29 mmol/L    Anion Gap 12 2 - 14 mmol/L    Glucose 331 (H) 65 - 100 mg/dL    BUN 18 8 - 23 mg/dL    Creatinine 9.22 9.39 - 1.00 mg/dL    BUN/Creatinine Ratio 23 (H) 12 - 20      Est, Glom Filt Rate 86 >59 ml/min/1.53m2    Calcium  9.1 8.8 - 10.2 mg/dL    Total Bilirubin 0.2 0.0 - 1.2 mg/dL    AST 35 10 - 35 U/L    ALT 25 10 - 35 U/L    Alk Phosphatase 61 35 - 104 U/L    Total Protein 6.8 6.4 - 8.3 g/dL    Albumin 3.6 3.5 - 5.2 g/dL    Globulin 3.3 2.0 - 4.0 g/dL    Albumin/Globulin Ratio 1.1 1.1 - 2.2     Brain Natriuretic Peptide    Collection Time: 05/22/24  1:45 PM   Result Value Ref Range    NT Pro-BNP 123 0 - 125 pg/mL   Troponin    Collection Time: 05/22/24  3:00 PM   Result Value Ref Range    Troponin T 18.8 (H) 0 - 14 ng/L   D-Dimer, Quantitative    Collection Time: 05/22/24  3:00 PM   Result Value Ref Range    D-Dimer, Quant <0.27 <0.50 ug/ml(FEU)   POCT Glucose    Collection Time: 05/22/24  4:50 PM   Result Value Ref Range    POC Glucose 307 (H) 65 - 100 mg/dL    Performed by: Cathryne Hart        EKG:.EKG interpreted by me. Shows Normal Sinus Rhythm with a HR of 87 bpm, right bundle branch block, EKG appears similar to prior.  No STEMI.    Radiologic Studies:  Radiographic images are visualized and preliminarily interpreted by the ED Provider with the following findings: Xray Interpreted by me.  Shows pulmonary findings.     Interpretation per the Radiologist below, if available at the time of this note:  XR CHEST 1 VIEW   Final Result   No acute cardiopulmonary  disease.         Electronically signed by Krystal Grippe, MD           Records Reviewed: See ED Course    MEDICAL DECISION MAKING and ED COURSE   5:50 PM Differential and Considerations of tests not ordered: Katrina Bowen is a 63 y.o. female with extensive cardiac history with 7 stents per patient with last cardiac catheterization June 2022 with no intervention presents with acute onset chest pain and shortness of breath that began upon awakening around 7 AM.  On arrival patient is hemodynamically stable maintaining O2 sat at 95 on room air without evidence of respiratory distress.  Patient was awoken from sleep with pain in her chest states that is feels similar to prior history of heart attack.  Patient high  risk with multiple cardiac risk factors including prior atherosclerotic disease status post 7 cardiac stents with a heart score of 6.  EKG appears similar to prior without new evidence of ischemia or STEMI.  Patient given additional tablets of chewable aspirin  for total of 324 mg.  Initial troponin mildly elevated, no additional signs of infection.  Patient with endorses shortness of breath with clear breath sounds bilaterally, no evidence of pneumonia no audible wheezing, no excessive edema or evidence of volume overload with BNP of 123.  Given high risk history will proceed with admission to medicine for cardiac evaluation.     Vitals:    Vitals:    05/22/24 1445 05/22/24 1530 05/22/24 1615 05/22/24 1648   BP: (!) 144/85  (!) 162/102 (!) 144/102   Pulse: 83 86 79 83   Resp: 22 11 11 18    Temp:    99 F (37.2 C)   TempSrc:    Oral   SpO2: 95% 99% 96% 95%   Weight:       Height:           ED COURSE  ED Course as of 05/22/24 1750   Sat May 22, 2024   1441 Troponin T(!): 19.3 [KP]      ED Course User Index  [KP] Donnal Neptune, MD       Clinical Management Tools:  Not Applicable, Chest Pain: MEDICAL DECISION MAKING:   I considered, but did not perform, additional testing such CT Angiogram, as well as admission  or transfer to a higher level of care.     I utilized an evidence-based risk rating tool (CMT) along with my training and experience to weigh the risk of discharge against the risks of further testing, imaging, or hospitalization. At this time, I estimate the risks of additional testing, imaging, or hospitalization to be equal to or greater than the risk of discharge(in the case of discharge home).      The patient's HEART Score is 6. In rare cases, I give patients with HEART Score of 4 the option of discharge, but only when they meet criteria for Low 4, meaning that HST was used, and the 4 is not from a highly suspicious story, highly suspicious EKG, or positive cardiac enzymes.  In these selected cases, the risk of a Low 4 is still most likely lower than the risk of admission and further testing/imaging. [id number]    SHARED DECISION MAKING:   I discussed my risk assessment with the patient. The patient understands and consents to the risk of disposition/plan, as well as the risk of uncertainty in estimating outcomes. [id number]          Smoking Cessation: Not Applicable    Patient was given the following medications:  Medications   aspirin  EC tablet 81 mg (has no administration in time range)   atorvastatin  (LIPITOR ) tablet 40 mg (has no administration in time range)   carvedilol  (COREG ) tablet 3.125 mg (3.125 mg Oral Given 05/22/24 1600)   levothyroxine  (SYNTHROID ) tablet 125 mcg (has no administration in time range)   losartan  (COZAAR ) tablet 50 mg (50 mg Oral Given 05/22/24 1719)   insulin  lispro (HUMALOG ,ADMELOG ) injection vial 0-8 Units (6 Units SubCUTAneous Given 05/22/24 1719)   glucose chewable tablet 16 g (has no administration in time range)   dextrose  bolus 10% 125 mL (has no administration in time range)     Or   dextrose  bolus 10% 250 mL (has no administration in time range)  glucagon  injection 1 mg (has no administration in time range)   dextrose  10 % infusion (has no  administration in time range)   sodium chloride  flush 0.9 % injection 5-40 mL (has no administration in time range)   sodium chloride  flush 0.9 % injection 5-40 mL (has no administration in time range)   0.9 % sodium chloride  infusion (has no administration in time range)   ondansetron  (ZOFRAN -ODT) disintegrating tablet 4 mg (has no administration in time range)     Or   ondansetron  (ZOFRAN ) injection 4 mg (has no administration in time range)   acetaminophen  (TYLENOL ) tablet 650 mg (650 mg Oral Given 05/22/24 1600)     Or   acetaminophen  (TYLENOL ) suppository 650 mg ( Rectal See Alternative 05/22/24 1600)   enoxaparin  (LOVENOX ) injection 40 mg (40 mg SubCUTAneous Given 05/22/24 1719)   metFORMIN  (GLUCOPHAGE ) tablet 500 mg (has no administration in time range)   morphine  (PF) injection 2 mg (2 mg IntraVENous Given 05/22/24 1600)   aspirin  chewable tablet 243 mg (243 mg Oral Given 05/22/24 1403)       CONSULTS: See ED Course/MDM for further details.  IP CONSULT TO SOCIAL WORK  IP CONSULT TO SPIRITUAL SERVICES  IP CONSULT TO CARDIOLOGY     PROCEDURES   See ED course/ MDM for any additional procedures.  Procedures      SEPSIS REASSESSMENT & CRITICAL CARE TIME   SEPSIS REASSESSMENT: No suspicion of bacterial infection and not having 2 SIRS during this visit.    Critical Care: Patient does not meet Critical Care Time, 0 minutes  CLINICAL IMPRESSIONS     1. Acute coronary syndrome (HCC)    2. Chest pain, unspecified type       SDOH/DISPOSITION/PLAN   Social Determinants affecting Treatment Plan: None    DISPOSITION Admitted 05/22/2024 03:38:43 PM             Admit Note: Pt is being admitted by Hospitalist . The results of their tests and reason(s) for their admission have been discussed with pt and/or available family. They convey agreement and understanding for the need to be admitted and for the admission diagnosis.     PATIENT REFERRED TO:  No follow-up provider specified.      DISCHARGE MEDICATIONS:     Medication  List        CONTINUE taking these medications      Blood Glucose Monitor System w/Device Kit  Pharmacist to identify preferred meter and strips.     Insulin  Pen Needle 32G X 4 MM Misc  1 each by Does not apply route daily     Lancets 30G Misc  Test 2-3 times a day & as needed for symptoms of irregular blood glucose. Dispense sufficient amount for indicated testing frequency plus additional to accommodate PRN testing needs. Pharmacist to identify preferred brand.            STOP taking these medications      clopidogrel  75 MG tablet  Commonly known as: PLAVIX      ketorolac  10 MG tablet  Commonly known as: TORADOL      ondansetron  4 MG disintegrating tablet  Commonly known as: ZOFRAN -ODT            ASK your doctor about these medications      acetaminophen  500 MG tablet  Commonly known as: TYLENOL   Take 2 tablets by mouth every 6 hours as needed for Pain     albuterol  sulfate HFA 108 (90 Base) MCG/ACT inhaler  Commonly known as: Proventil  HFA  Inhale 2 puffs into the lungs every 4 hours as needed for Wheezing     aspirin  81 MG EC tablet  Take 1 tablet by mouth daily     atorvastatin  40 MG tablet  Commonly known as: LIPITOR   Take 1 tablet by mouth nightly     blood glucose test strips  Test 2-3 times a day & as needed for symptoms of irregular blood glucose. Dispense sufficient amount for indicated testing frequency plus additional to accommodate PRN testing needs. Pharmacist to identify preferred brand.     carvedilol  3.125 MG tablet  Commonly known as: COREG   Take 1 tablet by mouth 2 times daily (with meals)     diclofenac sodium 1 % Gel  Commonly known as: VOLTAREN     empagliflozin  25 MG tablet  Commonly known as: Jardiance   Take 1 tablet by mouth daily     furosemide  40 MG tablet  Commonly known as: LASIX   Take 1 tablet by mouth daily     insulin  lispro (1 Unit Dial ) 100 UNIT/ML Sopn  Commonly known as: HumaLOG  KwikPen  Inject 10 Units into the skin 3 times daily (before meals)     Lantus  SoloStar 100 UNIT/ML  injection pen  Generic drug: insulin  glargine  Inject 35 Units into the skin daily     levothyroxine  125 MCG tablet  Commonly known as: SYNTHROID   Take 1 tablet by mouth every morning (before breakfast)     linaCLOtide  72 MCG Caps capsule  Commonly known as: LINZESS   Take 1 capsule by mouth every morning (before breakfast)     losartan  50 MG tablet  Commonly known as: COZAAR   Take 1 tablet by mouth daily     sertraline  50 MG tablet  Commonly known as: ZOLOFT   Take 1 tablet by mouth daily                DISCONTINUED MEDICATIONS:  Current Discharge Medication List          I am the Primary Clinician of Record. Josette Barre, MD (electronically signed)    (Please note that parts of this dictation were completed with voice recognition software. Quite often unanticipated grammatical, syntax, homophones, and other interpretive errors are inadvertently transcribed by the computer software. Please disregards these errors. Please excuse any errors that have escaped final proofreading.)        Barre Josette, MD  05/22/24 1750    "

## 2024-05-22 NOTE — ED Notes (Signed)
 "ED TO INPATIENT SBAR HANDOFF    Patient Name: Katrina Bowen   Preferred Name: [name]  DOB: [birth year]  63 y.o.   Family/Caregiver Present: no   Code Status Order: Full Code  PO Status: NPO:No  Telemetry Order: Yes  C-SSRS: Risk of Suicide: No Risk  Sitter no   Restraints:     Sepsis Risk Score Sepsis V2 Risk Score: 21.9    Situation  Chief Complaint   Patient presents with    Chest Pain     Brief Description of Patient's Condition: here for chest pain, SOB. Significant cardiac hx  Mental Status: oriented and alert  Arrived from:Home  Imaging:   XR CHEST 1 VIEW   Final Result   No acute cardiopulmonary disease.         Electronically signed by Krystal Grippe, MD        Abnormal labs:   Abnormal Labs Reviewed   CBC WITH AUTO DIFFERENTIAL - Abnormal; Notable for the following components:       Result Value    MPV 8.6 (*)     Monocytes % 4.4 (*)     All other components within normal limits   TROPONIN - Abnormal; Notable for the following components:    Troponin T 19.3 (*)     All other components within normal limits   COMPREHENSIVE METABOLIC PANEL - Abnormal; Notable for the following components:    Sodium 134 (*)     Glucose 331 (*)     BUN/Creatinine Ratio 23 (*)     All other components within normal limits   TROPONIN - Abnormal; Notable for the following components:    Troponin T 18.8 (*)     All other components within normal limits       Background  Allergies:   Allergies   Allergen Reactions    Latex Rash     Use paper tape only    Amitriptyline Anaphylaxis and Other (See Comments)     Muscle Cramps      Corticosteroids Anaphylaxis     ALL steroids.         Penicillins Anaphylaxis     Has patient had a PCN reaction causing immediate rash, facial/tongue/throat swelling, SOB or lightheadedness with hypotension: {Yes  Has patient had a PCN reaction causing severe rash involving mucus membranes or skin necrosis: NO  Has patient had a PCN reaction that required hospitalization NO  Has patient had a PCN reaction occurring  within the last 10 years: {Yes  If all of the above answers are NO, then may proceed with Cephalosporin use.      Pregabalin Anaphylaxis, Shortness Of Breath and Swelling    Duloxetine Other (See Comments)     Other reaction(s): neurological reaction      Gabapentin Swelling     History:   Past Medical History:   Diagnosis Date    Arthritis     Asthma     CHF (congestive heart failure) (HCC)     Chronic pain     Diabetes mellitus (HCC)     Diverticulitis     Fibromyalgia     Heart attack (HCC)     X2    History of ear infections     pt stated multiple    Hyperlipidemia     Hypertension     Hypothyroid     Neuropathy        Assessment  Vitals: MEWS Score: 1  Level of Consciousness: Alert (0)  Vitals:    05/22/24 1330 05/22/24 1345 05/22/24 1415 05/22/24 1445   BP:  139/77 (!) 162/80 (!) 144/85   Pulse:  85 88 83   Resp:  18 22 22    Temp:  98 F (36.7 C)     TempSrc:  Oral     SpO2:  97% 98% 95%   Weight: 77.1 kg (170 lb)      Height: 1.575 m (5' 2)        Deterioration Index (DI): Deterioration Index: 27.4  Deterioration Index (DI) Interventions Performed:    O2 Flow Rate:    O2 Device:    Cardiac Rhythm:    Critical Lab Results: @LABCR @  Cultures: Cultures:None  NIH Score: NIH     Active LDA's:   Peripheral IV 05/22/24 Right Antecubital (Active)     Active Central Lines:                          Active Wounds:    Active Foley's:    Active Feeding Tubes:      Administered Medications:   Medications   aspirin  EC tablet 81 mg (has no administration in time range)   atorvastatin  (LIPITOR ) tablet 40 mg (has no administration in time range)   carvedilol  (COREG ) tablet 3.125 mg (3.125 mg Oral Given 05/22/24 1600)   levothyroxine  (SYNTHROID ) tablet 125 mcg (has no administration in time range)   losartan  (COZAAR ) tablet 50 mg (has no administration in time range)   insulin  lispro (HUMALOG ,ADMELOG ) injection vial 0-8 Units (has no administration in time range)   glucose chewable tablet 16 g (has no administration in  time range)   dextrose  bolus 10% 125 mL (has no administration in time range)     Or   dextrose  bolus 10% 250 mL (has no administration in time range)   glucagon  injection 1 mg (has no administration in time range)   dextrose  10 % infusion (has no administration in time range)   sodium chloride  flush 0.9 % injection 5-40 mL (has no administration in time range)   sodium chloride  flush 0.9 % injection 5-40 mL (has no administration in time range)   0.9 % sodium chloride  infusion (has no administration in time range)   ondansetron  (ZOFRAN -ODT) disintegrating tablet 4 mg (has no administration in time range)     Or   ondansetron  (ZOFRAN ) injection 4 mg (has no administration in time range)   acetaminophen  (TYLENOL ) tablet 650 mg (650 mg Oral Given 05/22/24 1600)     Or   acetaminophen  (TYLENOL ) suppository 650 mg ( Rectal See Alternative 05/22/24 1600)   enoxaparin  (LOVENOX ) injection 40 mg (has no administration in time range)   metFORMIN  (GLUCOPHAGE ) tablet 500 mg (has no administration in time range)   morphine  (PF) injection 2 mg (2 mg IntraVENous Given 05/22/24 1600)   aspirin  chewable tablet 243 mg (243 mg Oral Given 05/22/24 1403)     Last documented pain medication administration: 1605 [place]  Pertinent or High Risk Medications/Drips: no   If Yes, please provide details: NA  Blood Product Administration: no  If Yes, please provide details: NA  Process Protocols/Bundles: N/A    Recommendation  Incomplete STAT orders: NA  Overdue Medications: NA  Patient Belongings:    Additional Comments: NA  If any further questions, please call Sending RN at [phone]      Admitting Unit Notification  Name of person notified and time: Jordan @ [phone]  Electronically signed by: Electronically signed by Elsie Lamar Georgia, RN on 05/22/2024 at 4:09 PM      "

## 2024-05-22 NOTE — ED Notes (Signed)
"  Pt placed on continuous cardiac monitoring, pulse ox, and blood pressure monitoring at this time.   "

## 2024-05-22 NOTE — H&P (Signed)
 "Hospitalist History & Physical Notes.           [facility].              Name : Katrina Bowen      MRN number : [id number]     Date of Birth : 08/07/1960     Subjective :   Chief Complaint : Chest pain    Source of information : Patient, Discussed with ED provider, Reviewed previous records.     History of present illness:   Katrina Bowen is  63 y.o. female with below mentioned past medical history with extensive cardiac history with multiple stents per patient with last cardiac catheterization June 2022 with no intervention, presents to the emergency room complaining of chest pain that is worse taking a deep breath.  It started this morning, radiating to the left side and left arm.  Denies any palpitations, orthopnea or paroxysmal nocturnal dyspnea.  No headache, no nausea or vomiting, no dizzy or lightheaded.  States that she took aspirin  this morning.    On review she is not taking any medications.  She admits not able to follow with any cardiologist or primary care physicians, when she comes to the hospital is the only time she gets prescriptions.  States has no transportation, taking care of mother and lives at a motel.       Past Medical History:   Diagnosis Date    Arthritis     Asthma     CHF (congestive heart failure) (HCC)     Chronic pain     Diabetes mellitus (HCC)     Diverticulitis     Fibromyalgia     Heart attack (HCC)     X2    History of ear infections     pt stated multiple    Hyperlipidemia     Hypertension     Hypothyroid     Neuropathy      Past Surgical History:   Procedure Laterality Date    CARDIAC CATHETERIZATION      x3 procedures; 7 stents    CESAREAN SECTION      COLONOSCOPY      COLONOSCOPY N/A 01/08/2022    COLONOSCOPY performed by Malvin Room, MD at [facility]    EAR SURGERY      pt stated several sx    HYSTERECTOMY (CERVIX STATUS UNKNOWN)       Family History   Problem Relation Age of Onset    No Known Problems Mother     No Known Problems Father       Social History      Tobacco Use    Smoking status: Former     Types: Cigarettes    Smokeless tobacco: Never   Substance Use Topics    Alcohol use: Not Currently     Comment: occassional       Allergies   Allergen Reactions    Latex Rash     Use paper tape only    Amitriptyline Anaphylaxis and Other (See Comments)     Muscle Cramps      Corticosteroids Anaphylaxis     ALL steroids.         Penicillins Anaphylaxis     Has patient had a PCN reaction causing immediate rash, facial/tongue/throat swelling, SOB or lightheadedness with hypotension: {Yes  Has patient had a PCN reaction causing severe rash involving mucus membranes or skin necrosis: NO  Has patient had a PCN reaction that required  hospitalization NO  Has patient had a PCN reaction occurring within the last 10 years: {Yes  If all of the above answers are NO, then may proceed with Cephalosporin use.      Pregabalin Anaphylaxis, Shortness Of Breath and Swelling    Duloxetine Other (See Comments)     Other reaction(s): neurological reaction      Gabapentin Swelling      Current Facility-Administered Medications   Medication Dose Route Frequency Provider Last Rate Last Admin    morphine  (PF) injection 2 mg  2 mg IntraVENous NOW Makarios Madlock, MD         Current Outpatient Medications   Medication Sig Dispense Refill    acetaminophen  (TYLENOL ) 500 MG tablet Take 2 tablets by mouth every 6 hours as needed for Pain 40 tablet 0    Blood Glucose Monitoring Suppl (BLOOD GLUCOSE MONITOR SYSTEM) w/Device KIT Pharmacist to identify preferred meter and strips. 1 kit 0    blood glucose monitor strips Test 2-3 times a day & as needed for symptoms of irregular blood glucose. Dispense sufficient amount for indicated testing frequency plus additional to accommodate PRN testing needs. Pharmacist to identify preferred brand. 100 strip 0    Lancets 30G MISC Test 2-3 times a day & as needed for symptoms of irregular blood glucose. Dispense sufficient amount for indicated testing frequency  plus additional to accommodate PRN testing needs. Pharmacist to identify preferred brand. 100 each 0    insulin  glargine (LANTUS  SOLOSTAR) 100 UNIT/ML injection pen Inject 35 Units into the skin daily 10 Adjustable Dose Pre-filled Pen Syringe 0    Insulin  Pen Needle 32G X 4 MM MISC 1 each by Does not apply route daily 100 each 3    insulin  lispro, 1 Unit Dial , (HUMALOG  KWIKPEN) 100 UNIT/ML SOPN Inject 10 Units into the skin 3 times daily (before meals) 5 Adjustable Dose Pre-filled Pen Syringe 0    empagliflozin  (JARDIANCE ) 25 MG tablet Take 1 tablet by mouth daily 30 tablet 3    sertraline  (ZOLOFT ) 50 MG tablet Take 1 tablet by mouth daily 30 tablet 1    atorvastatin  (LIPITOR ) 40 MG tablet Take 1 tablet by mouth nightly 30 tablet 1    losartan  (COZAAR ) 50 MG tablet Take 1 tablet by mouth daily 30 tablet 1    carvedilol  (COREG ) 3.125 MG tablet Take 1 tablet by mouth 2 times daily (with meals) 60 tablet 1    furosemide  (LASIX ) 40 MG tablet Take 1 tablet by mouth daily 30 tablet 1    linaCLOtide  (LINZESS ) 72 MCG CAPS capsule Take 1 capsule by mouth every morning (before breakfast) 30 capsule 1    levothyroxine  (SYNTHROID ) 125 MCG tablet Take 1 tablet by mouth every morning (before breakfast) 30 tablet 1    aspirin  81 MG EC tablet Take 1 tablet by mouth daily 30 tablet 1    albuterol  sulfate HFA (PROVENTIL  HFA) 108 (90 Base) MCG/ACT inhaler Inhale 2 puffs into the lungs every 4 hours as needed for Wheezing 18 g 2    diclofenac sodium (VOLTAREN) 1 % GEL Apply 2 g topically 4 times daily              Review of Systems:    Except social issues and chest pain denied any other complaints.    Vitals:     Vitals:    05/22/24 1330 05/22/24 1345 05/22/24 1415 05/22/24 1445   BP:  139/77 (!) 162/80 (!) 144/85   Pulse:  85 88  83   Resp:  18 22 22    Temp:  98 F (36.7 C)     TempSrc:  Oral     SpO2:  97% 98% 95%   Weight: 77.1 kg (170 lb)      Height: 1.575 m (5' 2)          Physical Exam:   General : Looks comfortable, no  respiratory distress noted.  HEENT : PERRLA, normal oral mucosa, atraumatic normocephalic, Normal ear and nose.  Neck : Supple, no JVD,   Lungs : Breath sounds with good air entry bilaterally, no wheezes or rales, no accessory muscle use.  CVS : Rhythm rate regular, S1+, S2+, no murmur or gallop.  Abdomen : Soft, nontender,  bowel sounds active.  Extremities : No edema noted,  pedal pulses palpable.  Musculoskeletal : Fair range of motion, no joint swelling or effusion, muscle tone and power appears normal.   Skin : Dry warm, .  Neurological : Awake, alert, oriented to time place person.  No focal deficits.  Psychiatric : Anxious and tearful.         Data Review:   Recent Results (from the past 24 hours)   CBC with Auto Differential    Collection Time: 05/22/24  1:45 PM   Result Value Ref Range    WBC 8.2 3.6 - 11.0 K/uL    RBC 4.34 3.80 - 5.20 M/uL    Hemoglobin 12.9 11.5 - 16.0 g/dL    Hematocrit 61.6 64.9 - 47.0 %    MCV 88.2 80.0 - 99.0 FL    MCH 29.7 26.0 - 34.0 PG    MCHC 33.7 30.0 - 36.5 g/dL    RDW 87.6 88.4 - 85.4 %    Platelets 202 150 - 400 K/uL    MPV 8.6 (L) 8.9 - 12.9 FL    Nucleated RBCs 0.0 0.0 PER 100 WBC    nRBC 0.00 0.00 - 0.01 K/uL    Neutrophils % 70.7 32.0 - 75.0 %    Lymphocytes % 21.3 12.0 - 49.0 %    Monocytes % 4.4 (L) 5.0 - 13.0 %    Eosinophils % 2.1 0.0 - 7.0 %    Basophils % 1.0 0.0 - 1.0 %    Immature Granulocytes % 0.5 0 - 0.5 %    Neutrophils Absolute 5.83 1.80 - 8.00 K/UL    Lymphocytes Absolute 1.75 0.80 - 3.50 K/UL    Monocytes Absolute 0.36 0.00 - 1.00 K/UL    Eosinophils Absolute 0.17 0.00 - 0.40 K/UL    Basophils Absolute 0.08 0.00 - 0.10 K/UL    Immature Granulocytes Absolute 0.04 0.00 - 0.04 K/UL    Differential Type AUTOMATED     Troponin    Collection Time: 05/22/24  1:45 PM   Result Value Ref Range    Troponin T 19.3 (H) 0 - 14 ng/L   Comprehensive Metabolic Panel    Collection Time: 05/22/24  1:45 PM   Result Value Ref Range    Sodium 134 (L) 136 - 145 mmol/L    Potassium  5.0 3.5 - 5.1 mmol/L    Chloride 100 98 - 107 mmol/L    CO2 21 20 - 29 mmol/L    Anion Gap 12 2 - 14 mmol/L    Glucose 331 (H) 65 - 100 mg/dL    BUN 18 8 - 23 mg/dL    Creatinine 9.22 9.39 - 1.00 mg/dL    BUN/Creatinine Ratio 23 (H) 12 -  20      Est, Glom Filt Rate 86 >59 ml/min/1.69m2    Calcium  9.1 8.8 - 10.2 mg/dL    Total Bilirubin 0.2 0.0 - 1.2 mg/dL    AST 35 10 - 35 U/L    ALT 25 10 - 35 U/L    Alk Phosphatase 61 35 - 104 U/L    Total Protein 6.8 6.4 - 8.3 g/dL    Albumin 3.6 3.5 - 5.2 g/dL    Globulin 3.3 2.0 - 4.0 g/dL    Albumin/Globulin Ratio 1.1 1.1 - 2.2     Brain Natriuretic Peptide    Collection Time: 05/22/24  1:45 PM   Result Value Ref Range    NT Pro-BNP 123 0 - 125 pg/mL   Troponin    Collection Time: 05/22/24  3:00 PM   Result Value Ref Range    Troponin T 18.8 (H) 0 - 14 ng/L       Radiologic Studies :   Imaging:   XR CHEST 1 VIEW   Final Result   No acute cardiopulmonary disease.         Electronically signed by Krystal Grippe, MD             Assessment and Plan :     Chest pain: Troponin indeterminate elevation.  Requested cardiology consultation, will follow-up with repeat troponin in the morning.    Diabetes mellitus type 2 uncontrolled with hyperglycemia: Not taking any medications, will start on metformin  as her renal functions appears good so she can at least get medications.  Will keep her on sliding scale insulin  and adjust medications as required.    Essential hypertension: Not at goal but again she is not taking any medications, we will continue carvedilol  and losartan ,  monitor blood pressure and adjust.    History of coronary artery disease : Non-ST elevation MI in 2016.  Last cardiac catheterization 2022.       Medications Home :    Reviewed    Code status : Full code    VTE prophylaxis :  Enoxaparin     Advance Medical directive : Health care decision maker information is on file.         Discussion/MDM:   I have discussed patient's presentation/findings and clinical course to date  with ED provider.     Given the patient's current clinical presentation, I have a high level of concern for decompensation if discharged from the emergency department as patient has  medical comorbidities with increased risk of morbidity and mortality  that warrants admission to hospital.     I have reviewed patient's presenting subjective and objective findings, as well as all laboratory studies, imaging studies, and vital signs to date as well as treatment rendered and patient's response to those treatments.  In addition, prior medical, surgical and relevant social and family histories were reviewed.      CC : No, Pcp  Signed By: Rosia Rump, MD     May 22, 2024      This dictation was done by dragon, advertising account planner.  Often unanticipated grammatical, syntax, Homer phones and other interpretive errors are inadvertently transcribed.  Please excuse errors that have escaped final proofreading.   "

## 2024-05-22 NOTE — Progress Notes (Signed)
"  4 Eyes Skin Assessment     NAME:  Anaiz Reveron  DATE OF BIRTH:  October 01, 1960  MEDICAL RECORD NUMBER:  [id number]    The patient is being assessed for  Admission    I agree that at least one RN has performed a thorough Head to Toe Skin Assessment on the patient. ALL assessment sites listed below have been assessed.      Areas assessed by both nurses:    Head, Face, Ears, Shoulders, Back, Chest, Arms, Elbows, Hands, Sacrum. Buttock, Coccyx, Ischium, and Legs. Feet and Heels        Does the Patient have a Wound? No noted wound(s)       Braden Prevention initiated by RN: No  Wound Care Orders initiated by RN: No    For hospital-acquired stage 1 & 2 and ALL Stage 3,4, Unstageable, DTI, NWPT, and Complex wounds: place order IP Wound Care/Ostomy Nurse Eval and Treat by RN under ORDER ENTRY: No    New Ostomies, if present place, Ostomy referral order under ORDER ENTRY: No     Nurse 1 eSignature: Electronically signed by Ferrel Culver, RN on 05/22/24 at 6:08 PM EST    **SHARE this note so that the co-signing nurse can place an eSignature**    Nurse 2 eSignature: Electronically signed by Alberta Lim, RN on 05/22/24 at 6:24 PM EST   "

## 2024-05-22 NOTE — ED Triage Notes (Signed)
"  Chest pain since 0700. Reports worse with deep breathing and palpation. Pain radiates to left side.  Took ASA 325mg  PO.  Reports cardiac hx.   "

## 2024-05-22 NOTE — ED Notes (Signed)
"  X-Ray at patient's bedside.     "

## 2024-05-23 ENCOUNTER — Observation Stay: Admit: 2024-05-23 | Payer: Medicare (Managed Care)

## 2024-05-23 LAB — ECHO (TTE) COMPLETE (PRN CONTRAST/BUBBLE/STRAIN/3D)
AV Area by Peak Velocity: 2.2 cm2
AV Area by VTI: 2.1 cm2
AV Mean Gradient: 8 mmHg
AV Mean Velocity: 1.3 m/s
AV Peak Gradient: 12 mmHg
AV Peak Velocity: 1.8 m/s
AV VTI: 35.7 cm
AV Velocity Ratio: 0.72
AVA/BSA Peak Velocity: 1.2 cm2/m2
AVA/BSA VTI: 1.2 cm2/m2
Ao Root Index: 1.82 cm/m2
Aortic Root: 3.3 cm
Ascending Aorta Index: 2.04 cm/m2
Ascending Aorta: 3.7 cm
Body Surface Area: 1.84 m2
E/E' Lateral: 14.49
E/E' Ratio (Averaged): 16.84
E/E' Septal: 19.19
EF BP: 68 % (ref 55–100)
EF Physician: 70 %
Fractional Shortening 2D: 40 % (ref 28–44)
IVSd: 1.6 cm — AB (ref 0.6–0.9)
LA Area 4C: 15.9 cm2
LA Diameter: 3.3 cm
LA Major Axis: 4.8 cm
LA Size Index: 1.82 cm/m2
LA Volume Index MOD A4C: 25 mL/m2 (ref 16–34)
LA Volume MOD A4C: 45 mL (ref 22–52)
LA/AO Root Ratio: 1
LV E' Lateral Velocity: 4.9 cm/s
LV E' Septal Velocity: 3.7 cm/s
LV EDV A2C: 50 mL
LV EDV A4C: 55 mL
LV EDV Index A2C: 28 mL/m2
LV EDV Index A4C: 30 mL/m2
LV ESV A2C: 18 mL
LV ESV A4C: 14 mL
LV ESV Index A2C: 10 mL/m2
LV ESV Index A4C: 8 mL/m2
LV Ejection Fraction A2C: 64 %
LV Ejection Fraction A4C: 74 %
LV Mass 2D Index: 86.3 g/m2 (ref 43–95)
LV Mass 2D: 156.3 g (ref 67–162)
LV RWT Ratio: 2.2
LVIDd Index: 1.1 cm/m2
LVIDd: 2 cm — AB (ref 3.9–5.3)
LVIDs Index: 0.66 cm/m2
LVIDs: 1.2 cm
LVOT Area: 3.1 cm2
LVOT Diameter: 2 cm
LVOT Mean Gradient: 3 mmHg
LVOT Peak Gradient: 6 mmHg
LVOT Peak Velocity: 1.3 m/s
LVOT SV: 76 mL
LVOT Stroke Volume Index: 42.2 mL/m2
LVOT VTI: 24.3 cm
LVOT:AV VTI Index: 0.68
LVPWd: 2.2 cm — AB (ref 0.6–0.9)
MV A Velocity: 0.82 m/s
MV Area by VTI: 2.6 cm2
MV E Velocity: 0.71 m/s
MV E Wave Deceleration Time: 342 ms
MV E/A: 0.87
MV Max Velocity: 0.9 m/s
MV Mean Gradient: 2 mmHg
MV Mean Velocity: 0.7 m/s
MV Peak Gradient: 3 mmHg
MV VTI: 29 cm
MV:LVOT VTI Index: 1.19
PV Max Velocity: 1 m/s
PV Mean Gradient: 2 mmHg
PV Mean Velocity: 0.7 m/s
PV Peak Gradient: 4 mmHg
PV VTI: 21.5 cm
RA Area 4C: 10.6 cm2
RA Volume Index A4C: 13 mL/m2
RA Volume: 24 mL
RV Basal Dimension: 3.1 cm
RV Free Wall Peak S': 10.3 cm/s
TAPSE: 1.5 cm — AB (ref 1.7–?)

## 2024-05-23 LAB — TROPONIN
Troponin T: 19.9 ng/L — ABNORMAL HIGH (ref 0–14)
Troponin T: 22.6 ng/L — ABNORMAL HIGH (ref 0–14)

## 2024-05-23 LAB — EKG 12-LEAD
Atrial Rate: 87 {beats}/min
Diagnosis: NORMAL
P Axis: 61 degrees
P-R Interval: 156 ms
Q-T Interval: 406 ms
QRS Duration: 140 ms
QTc Calculation (Bazett): 488 ms
R Axis: 269 degrees
T Axis: 59 degrees
Ventricular Rate: 87 {beats}/min

## 2024-05-23 LAB — POCT GLUCOSE
POC Glucose: 283 mg/dL — ABNORMAL HIGH (ref 65–100)
POC Glucose: 258 mg/dL — ABNORMAL HIGH (ref 65–100)
POC Glucose: 271 mg/dL — ABNORMAL HIGH (ref 65–100)
POC Glucose: 187 mg/dL — ABNORMAL HIGH (ref 65–100)

## 2024-05-23 LAB — T4, FREE: T4 Free: <0.2 ng/dL — ABNORMAL LOW (ref 0.9–1.6)

## 2024-05-23 LAB — RENAL FUNCTION PANEL
Albumin: 3.3 g/dL — ABNORMAL LOW (ref 3.5–5.2)
Anion Gap: 9 mmol/L (ref 2–14)
BUN/Creatinine Ratio: 23 — ABNORMAL HIGH (ref 12–20)
BUN: 16 mg/dL (ref 8–23)
CO2: 27 mmol/L (ref 20–29)
Calcium: 9.1 mg/dL (ref 8.8–10.2)
Chloride: 99 mmol/L (ref 98–107)
Creatinine: 0.69 mg/dL (ref 0.60–1.00)
Est, Glom Filt Rate: >90 ml/min/1.73m2 (ref >59)
Glucose: 247 mg/dL — ABNORMAL HIGH (ref 65–100)
Phosphorus: 2.7 mg/dL (ref 2.5–4.5)
Potassium: 3.8 mmol/L (ref 3.5–5.1)
Sodium: 135 mmol/L — ABNORMAL LOW (ref 136–145)

## 2024-05-23 LAB — HEMOGLOBIN A1C
Estimated Avg Glucose: 240 mg/dL
Hemoglobin A1C: 10 % — ABNORMAL HIGH (ref 4.0–5.6)

## 2024-05-23 LAB — VITAMIN D 25 HYDROXY: Vit D, 25-Hydroxy: 7 ng/mL — ABNORMAL LOW (ref 30.0–100.0)

## 2024-05-23 LAB — TSH: TSH, 3rd Generation: 81.9 u[IU]/mL — ABNORMAL HIGH (ref 0.270–4.200)

## 2024-05-23 MED ORDER — MORPHINE SULFATE (PF) 2 MG/ML IV SOLN
2 | INTRAVENOUS | Status: DC | PRN
Start: 2024-05-23 — End: 2024-05-25
  Administered 2024-05-23 – 2024-05-25 (×13): 2 mg via INTRAVENOUS

## 2024-05-23 MED ORDER — INSULIN GLARGINE 100 UNIT/ML SC SOLN
100 | Freq: Every evening | SUBCUTANEOUS | Status: DC
Start: 2024-05-23 — End: 2024-05-26
  Administered 2024-05-24 – 2024-05-26 (×3): 35 [IU] via SUBCUTANEOUS

## 2024-05-23 MED FILL — MORPHINE SULFATE 2 MG/ML IJ SOLN: 2 mg/mL | INTRAMUSCULAR | Qty: 1 | Fill #0

## 2024-05-23 MED FILL — INSULIN LISPRO 100 UNIT/ML IJ SOLN: 100 [IU]/mL | INTRAMUSCULAR | Qty: 4 | Fill #0

## 2024-05-23 MED FILL — LEVOTHYROXINE SODIUM 25 MCG PO TABS: 25 ug | ORAL | Qty: 1 | Fill #0

## 2024-05-23 MED FILL — ATORVASTATIN CALCIUM 40 MG PO TABS: 40 mg | ORAL | Qty: 1 | Fill #0

## 2024-05-23 MED FILL — ASPIRIN LOW DOSE 81 MG PO TBEC: 81 mg | ORAL | Qty: 1 | Fill #0

## 2024-05-23 MED FILL — CARVEDILOL 3.125 MG PO TABS: 3.125 mg | ORAL | Qty: 1 | Fill #0

## 2024-05-23 MED FILL — LOSARTAN POTASSIUM 50 MG PO TABS: 50 mg | ORAL | Qty: 1 | Fill #0

## 2024-05-23 MED FILL — ENOXAPARIN SODIUM 40 MG/0.4ML IJ SOSY: 40 MG/0.4ML | INTRAMUSCULAR | Qty: 0.4 | Fill #0

## 2024-05-23 MED FILL — INSULIN LISPRO 100 UNIT/ML IJ SOLN: 100 [IU]/mL | INTRAMUSCULAR | Qty: 2 | Fill #0

## 2024-05-23 MED FILL — METFORMIN HCL 500 MG PO TABS: 500 mg | ORAL | Qty: 1 | Fill #0

## 2024-05-23 NOTE — Progress Notes (Signed)
 "      Hospitalist Progress Note    NAME:   Katrina Bowen   DOB: 1961/04/29   MRN: [id number]     Date/Time: 05/23/2024 11:20 AM  Patient PCP: No, Pcp    Estimated discharge date: 24 hours  Barriers: Echo, cardiac stress test tomorrow, resolution of hyperglycemia    Hospital course:  63 year old female with a past medical history of hypothyroidism, DM type II, hypertension, CAD s/p PCI, HFpEF, diabetic neuropathy, arthritis, fibromyalgia, overall medication noncompliance who was admitted on 11/22 for chest pain.  Patient noted to have troponin elevation with a peak at 19.9.  Echo with normal ventricular systolic function and an EF of 65 to 70%, findings consistent with severe concentric hypertrophy and abnormal diastolic dysfunction.  Cardiology consulted and visited patient noting that troponin elevation is likely secondary to demand ischemia, but given patient's inability to obtain transportation/medication noncompliance overall will perform stress test on 11/24.  Additionally patient noted to be hyperglycemic and notes that she has run out of her insulin  at home so we will restart patient on home dose of 35 units of Lantus  and repeat A1c.  Furthermore patient noted to have a TSH elevation at 81, pending free T4 so we will reintroduce levothyroxine  at home dose.  Patient noted to have chronic pain secondary to fibromyalgia/arthritis and diabetic neuropathy and has been placed on morphine  but is requested IV Dilaudid .  Will hold off on initiation of IV Dilaudid  at this time.    Assessment / Plan:  Chest pain  NSTEMI type II  - Mild troponin elevation with a peak at 19.9, likely demand ischemia  -EKG upon admission with NSR at 87 bpm's with a right bundle branch block and a left posterior fascicular block with no evidence of ischemia or significant change  -Continue telemetry  - Cardiology following, recommend stress test in the a.m. 11/24    Uncontrolled hypothyroidism  - TSH elevated at 81, pending free T4  - Likely  secondary to medication noncompliance  - Restart levothyroxine     Uncontrolled DM type II  - Patient reports 35 units of Lantus  nightly and 6 units of short acting during mealtimes  - Most recent A1c 15.8 in August, pending repeat  - Stop metformin  as she notes this resulted in GI upset  - Medium dose insulin  sliding scale with Accu-Cheks  - Restart home dose of Lantus   - Monitor for signs and symptoms of hypoglycemia    Hypertension  CAD s/p PCI  HFpEF with an EF of 65 to 70%, not currently in exacerbation  - Patient reports history of 7 stents  - Echo with normal left ventricular systolic function and an EF of 65 to 70%, findings consistent with severe concentric hypertrophy and abnormal diastolic function  - Continue aspirin , Lipitor , Coreg , losartan   - Previously on p.o. Lasix , will hold given euvolemia    Diabetic neuropathy  Arthritis  Fibromyalgia  - Per patient has previously followed up with pain management  - Will need follow-up to pain management outpatient  - Continue morphine , patient requesting Dilaudid .  - Per chart review, patient has several allergic reactions to amitriptyline, steroids, Lyrica, duloxetine and gabapentin.     Overall medication noncompliance  - Patient is unhoused, living in [place], and has lack of transportation to pick up medication refills  - Will need multiple refills of medications upon discharge to encourage compliance    Medical Decision Making:   [x]  High (any 2)    A. Problems (  any 1)  [x]  Acute/Chronic Illness/injury posing threat to life or bodily function: Chest pain  []  Severe exacerbation of chronic illness:    ---------------------------------------------------------------------  B. Risk of Treatment (any 1)   [x]  Drugs/treatments that require intensive monitoring for toxicity include:    []  IV ABX requiring serial renal monitoring for nephrotoxicity:     []  IV Narcotic analgesia for adverse drug reaction  []  Aggressive IV diuresis requiring serial monitoring for renal  impairment and electrolyte derangements  []  Critical electrolyte abnormalities requiring IV replacement and close serial monitoring  [x]  SQ Insulin  SS- monitoring serial FSBS for Hypoglycemic adverse drug reaction  []  Other -   []  Change in code status:    []  Decision to escalate care:    []  Major surgery/procedure with associated risk factors:    ----------------------------------------------------------------------  C. Data (any 2)  [x]  Discussed current management and discharge planning options with Case Management.  [x]  Discussed management of the case with: Patient, RN, cardiology  [x]  Telemetry personally reviewed and interpreted as documented above    [x]  Imaging personally reviewed and interpreted, includes:    []  Data Review (any 3)  []  All available Consultant notes from yesterday/today were reviewed  []  All current labs were reviewed and interpreted for clinical significance   []  Appropriate follow-up labs were ordered  []  Collateral history obtained from:       Code Status: Full  DVT Prophylaxis: Lovenox   GI Prophylaxis: Not indicated    Subjective:     Chief Complaint / Reason for Physician Visit  Patient visited this morning with cardiology sitting comfortably in chair adjacent to bedside noting that she is having both dull and sharp chest pain.  She notes that she is overall nonadherent to her medications given she has a lack of transportation and is unable to pick up medication refills.  She notes that she recently moved here from [facility]  and has not established care with any providers.  Cardiology discussed with patient stress test to be performed in the morning.  Patient demonstrates understanding is amenable to above plan.  Discussed with RN events overnight.     Objective:     VITALS:   Last 24hrs VS reviewed since prior progress note. Most recent are:  Patient Vitals for the past 24 hrs:   BP Temp Temp src Pulse Resp SpO2 Height Weight   05/23/24 0806 -- -- -- -- 20 -- -- --   05/23/24  0736 132/81 97.7 F (36.5 C) Oral 81 18 97 % -- --   05/23/24 0555 -- -- -- -- -- -- -- 79.3 kg (174 lb 13.2 oz)   05/23/24 0415 132/76 97.9 F (36.6 C) Oral 76 18 98 % -- --   05/23/24 0234 -- -- -- -- 18 -- -- --   05/22/24 2325 121/70 98.1 F (36.7 C) Oral 78 18 98 % -- --   05/22/24 2125 -- -- -- -- 18 -- -- --   05/22/24 2000 115/78 98.6 F (37 C) Oral 86 18 93 % -- --   05/22/24 1648 (!) 144/102 99 F (37.2 C) Oral 83 18 95 % -- --   05/22/24 1615 (!) 162/102 -- -- 79 11 96 % -- --   05/22/24 1530 -- -- -- 86 11 99 % -- --   05/22/24 1445 (!) 144/85 -- -- 83 22 95 % -- --   05/22/24 1415 (!) 162/80 -- -- 88 22 98 % -- --   05/22/24 1345  139/77 98 F (36.7 C) Oral 85 18 97 % -- --   05/22/24 1330 -- -- -- -- -- -- 1.575 m (5' 2) 77.1 kg (170 lb)       No intake or output data in the 24 hours ending 05/23/24 1120     I had a face to face encounter and independently examined this patient on 05/23/2024, as outlined below:    Review of Systems   Constitutional:  Positive for fatigue. Negative for activity change and appetite change.   Respiratory:  Positive for chest tightness. Negative for shortness of breath.    Cardiovascular:  Positive for chest pain. Negative for palpitations and leg swelling.   Gastrointestinal:  Negative for abdominal pain, diarrhea, nausea and vomiting.   Genitourinary:  Negative for dysuria, hematuria and urgency.   Musculoskeletal:  Positive for arthralgias and myalgias.   Neurological:  Negative for weakness and headaches. Numbness: Bilateral lower extremities.  Psychiatric/Behavioral:  Negative for behavioral problems, dysphoric mood and suicidal ideas.       PHYSICAL EXAM:  Physical Exam  Vitals and nursing note reviewed.   Constitutional:       Appearance: She is ill-appearing.   HENT:      Head: Normocephalic and atraumatic.   Eyes:      Extraocular Movements: Extraocular movements intact.   Cardiovascular:      Rate and Rhythm: Normal rate and regular rhythm.      Heart  sounds: No murmur heard.  Pulmonary:      Effort: Pulmonary effort is normal. No respiratory distress.      Breath sounds: Normal breath sounds. No wheezing.   Abdominal:      General: Abdomen is flat. There is no distension.      Palpations: Abdomen is soft.      Tenderness: There is no abdominal tenderness.   Musculoskeletal:      Right lower leg: No edema.      Left lower leg: No edema.   Skin:     General: Skin is warm.   Neurological:      Mental Status: She is alert and oriented to person, place, and time.   Psychiatric:         Mood and Affect: Mood normal.         Behavior: Behavior normal.         Thought Content: Thought content normal.         Judgment: Judgment normal.       Reviewed most current lab test results and cultures  YES  Reviewed most current radiology test results   YES  Review and summation of old records today    NO  Reviewed patient's current orders and MAR    YES  PMH/SH reviewed - no change compared to H&P  ________________________________________________________________________  Care Plan discussed with:    Comments   Patient x    Family      RN x    Care Manager     Consultant                        Multidiciplinary team rounds were held today with case manager, nursing, pharmacist and higher education careers adviser.  Patient's plan of care was discussed; medications were reviewed and discharge planning was addressed.     ________________________________________________________________________  Total NON critical care TIME:  35  Minutes    Total CRITICAL CARE TIME Spent:   Minutes non procedure based  Comments   >50% of visit spent in counseling and coordination of care     ________________________________________________________________________  Waddell Formosa, PA-C     Procedures: see electronic medical records for all procedures/Xrays and details which were not copied into this note but were reviewed prior to creation of Plan.      LABS:  I reviewed today's most current labs and imaging  studies.  Pertinent labs include:  Recent Labs     05/22/24  1345   WBC 8.2   HGB 12.9   HCT 38.3   PLT 202     Recent Labs     05/22/24  1345 05/23/24  0315   NA 134* 135*   K 5.0 3.8   CL 100 99   CO2 21 27   BUN 18 16   PHOS  --  2.7   ALT 25  --        Signed: Waddell Formosa, PA-C  "

## 2024-05-23 NOTE — Consults (Signed)
 "     CARDIOLOGY CONSULTATION    REASON FOR CONSULT: elevated troponin h/o CAD w/ multiple stents    REQUESTING PROVIDER:     Kondragunta, Sakuntla, MD       CHIEF COMPLAINT:  Chest pain    HISTORY OF PRESENT ILLNESS:  Katrina Bowen is a 63 y.o. year-old female with past medical history significant for hypothyroidism, DM type II, hypertension, CAD s/p PCI, HFpEF, diabetic neuropathy, arthritis, fibromyalgia, overall medication noncompliance who was evaluated today due to chest pain with minimally elevated troponin. On exam, denies pain at present. States states the pain woke her up from sleep. She reports she is homeless, has transportation issues which makes it hard to follow up or to get her medications.       Records from hospital admission course thus far reviewed.      11/2020 LHC   1.  Chronic occlusion of the RCA with left-to-right collaterals, unchanged from prior studies   2.  Continued patency of the stented segment in the proximal and mid LAD with mild in-stent restenosis, no flow-limiting disease noted   3.  Significant ostial stenosis of a small diagonal branch jailed by the stented segment of LAD, appropriate for medical therapy   4.  Continued patency of the stented segment in the proximal, mid, and distal circumflex as well as the obtuse marginal branch without significant restenosis   Recommend: medical therapy       Telemetry reviewed.  No events overnight.     INPATIENT MEDICATIONS:  Home medications reviewed.    Current Facility-Administered Medications:     insulin  glargine (LANTUS ) injection vial 35 Units, 35 Units, SubCUTAneous, Nightly, Perkinson, Waddell, PA-C    aspirin  EC tablet 81 mg, 81 mg, Oral, Daily, Kondragunta, Sakuntla, MD, 81 mg at 05/23/24 0848    atorvastatin  (LIPITOR ) tablet 40 mg, 40 mg, Oral, Nightly, Kondragunta, Sakuntla, MD, 40 mg at 05/22/24 2055    carvedilol  (COREG ) tablet 3.125 mg, 3.125 mg, Oral, BID WC, Kondragunta, Sakuntla, MD, 3.125 mg at 05/23/24 0848     levothyroxine  (SYNTHROID ) tablet 125 mcg, 125 mcg, Oral, QAM AC, Kondragunta, Sakuntla, MD, 125 mcg at 05/23/24 0554    losartan  (COZAAR ) tablet 50 mg, 50 mg, Oral, Daily, Kondragunta, Sakuntla, MD, 50 mg at 05/23/24 0848    insulin  lispro (HUMALOG ,ADMELOG ) injection vial 0-8 Units, 0-8 Units, SubCUTAneous, 4x Daily AC & HS, Kondragunta, Sakuntla, MD, 4 Units at 05/23/24 0849    glucose chewable tablet 16 g, 4 tablet, Oral, PRN, Kondragunta, Sakuntla, MD    dextrose  bolus 10% 125 mL, 125 mL, IntraVENous, PRN **OR** dextrose  bolus 10% 250 mL, 250 mL, IntraVENous, PRN, Kondragunta, Sakuntla, MD    glucagon  injection 1 mg, 1 mg, SubCUTAneous, PRN, Kondragunta, Sakuntla, MD    dextrose  10 % infusion, , IntraVENous, Continuous PRN, Kondragunta, Sakuntla, MD    sodium chloride  flush 0.9 % injection 5-40 mL, 5-40 mL, IntraVENous, 2 times per day, Kondragunta, Sakuntla, MD, 10 mL at 05/23/24 0740    sodium chloride  flush 0.9 % injection 5-40 mL, 5-40 mL, IntraVENous, PRN, Kondragunta, Sakuntla, MD    0.9 % sodium chloride  infusion, , IntraVENous, PRN, Kondragunta, Sakuntla, MD    ondansetron  (ZOFRAN -ODT) disintegrating tablet 4 mg, 4 mg, Oral, Q8H PRN **OR** ondansetron  (ZOFRAN ) injection 4 mg, 4 mg, IntraVENous, Q6H PRN, Kondragunta, Sakuntla, MD    acetaminophen  (TYLENOL ) tablet 650 mg, 650 mg, Oral, Q6H PRN, 650 mg at 05/22/24 1600 **OR** acetaminophen  (TYLENOL ) suppository 650 mg, 650 mg, Rectal,  Q6H PRN, Kondragunta, Sakuntla, MD    enoxaparin  (LOVENOX ) injection 40 mg, 40 mg, SubCUTAneous, Daily, Kondragunta, Sakuntla, MD, 40 mg at 05/23/24 0848    morphine  (PF) injection 2 mg, 2 mg, IntraVENous, Q4H PRN, Baroni, Austin, PA-C, 2 mg at 05/23/24 1157     ALLERGIES:  Allergies reviewed with the patient,  Allergies   Allergen Reactions    Latex Rash     Use paper tape only    Amitriptyline Anaphylaxis and Other (See Comments)     Muscle Cramps      Corticosteroids Anaphylaxis     ALL steroids.         Penicillins  Anaphylaxis     Has patient had a PCN reaction causing immediate rash, facial/tongue/throat swelling, SOB or lightheadedness with hypotension: {Yes  Has patient had a PCN reaction causing severe rash involving mucus membranes or skin necrosis: NO  Has patient had a PCN reaction that required hospitalization NO  Has patient had a PCN reaction occurring within the last 10 years: {Yes  If all of the above answers are NO, then may proceed with Cephalosporin use.      Pregabalin Anaphylaxis, Shortness Of Breath and Swelling    Duloxetine Other (See Comments)     Other reaction(s): neurological reaction      Gabapentin Swelling    .      FAMILY HISTORY:  Family history reviewed.       SOCIAL HISTORY:  Notable for past tobacco use, no heavy alcohol or illicit drug use.      REVIEW OF SYSTEMS:  Complete review of systems performed, pertinents noted above, all other systems are negative.    PHYSICAL EXAMINATION:    General:  NAD  Cardiovascular:  RRR, no murmur  Respiratory:  Lungs are diminished  Abdomen:  soft, nontender  Extremities:  no lower extremity edema  Skin:  Dry, warm  Psych:  Normal affect      Vitals:    05/23/24 1143   BP: 114/81   Pulse: 81   Resp: 18   Temp: 97.9 F (36.6 C)   SpO2: 98%       Recent labs results and imaging reviewed.     Discussed case with Dr. Floydene and our impression and recommendations are as follows:  Elevated troponin T w/ chest pain:  Troponin peak of 19.9, suspect demand   EKG without acute ischemic changes  Given chest pain with significant CAD history and limited resources to follow up, will obtain NST inpatient, tomorrow morning. Keep NPO after midnight.  Continue telemetry monitoring  Check echocardiogram  Coronary artery disease:   s/p multiple stents.   LHC 2022 w/ stable disease, patent stents (outlined above).   Continue asa, statin, BB.   Hypertension: BP close to goal.   Continue losartan , coreg  - uptitrate as indicated.   Hyperlipidemia: continue statin  Diabetes:  uncontrolled,   A1C was 15.8 in Aug, repeat pending.   Tx per primary team  Hypothyroidism: TSH 81, FT4 pending.   Tx per primary team.  Social needs - homeless, no transportation. Need CM to assist w/ medication delivery, Medicaid application, etc.     Thank you for involving us  in the care of this patient.  Please do not hesitate to call me or Dr. Floydene if additional questions arise.    Jon CHRISTELLA Lee, FNP  05/23/2024   "

## 2024-05-23 NOTE — Progress Notes (Signed)
"  Spiritual Health Attempted Visit Note  [facility]      Room # 481/01    Name: Katrina Bowen           Age: 63 y.o.    Gender: female          MRN: [id number]  Religion: Methodist       Preferred Language: English      Date: 05/23/24  Visit Time: Begin Time: 1445 End Time : 1454 Complexity of Encounter: Low      Visit Summary: Chaplain is attempting response to the spiritual consult for the patient in 481. The patient was asleep and did not respond to the chaplain's prompts. Spiritual care will follow up for ongoing support.       Patient was not available. Patient was working with other staff/was asleep/ indisposed. Chaplain will follow-up at a later time.    Electronically signed by   Elia Chyrl Penner, M.Div, M.S, [facility] on 05/23/2024 at 2:56 PM.   Spiritual Health  [facility] can be reached by calling the operator at [facility]   [phone]  "

## 2024-05-23 NOTE — Plan of Care (Signed)
"    Problem: Chronic Conditions and Co-morbidities  Goal: Patient's chronic conditions and co-morbidity symptoms are monitored and maintained or improved  05/23/2024 0921 by Tisa Sotero BIRCH, RN  Outcome: Progressing  Flowsheets (Taken 05/23/2024 [phone])  Care Plan - Patient's Chronic Conditions and Co-Morbidity Symptoms are Monitored and Maintained or Improved:   Monitor and assess patient's chronic conditions and comorbid symptoms for stability, deterioration, or improvement   Collaborate with multidisciplinary team to address chronic and comorbid conditions and prevent exacerbation or deterioration   Update acute care plan with appropriate goals if chronic or comorbid symptoms are exacerbated and prevent overall improvement and discharge  05/22/2024 2110 by Candido Rung, RN  Outcome: Progressing     Problem: Discharge Planning  Goal: Discharge to home or other facility with appropriate resources  05/23/2024 0921 by Tisa Sotero BIRCH, RN  Outcome: Progressing  Flowsheets (Taken 05/23/2024 [phone])  Discharge to home or other facility with appropriate resources:   Identify barriers to discharge with patient and caregiver   Arrange for needed discharge resources and transportation as appropriate   Identify discharge learning needs (meds, wound care, etc)   Refer to discharge planning if patient needs post-hospital services based on physician order or complex needs related to functional status, cognitive ability or social support system  05/22/2024 2110 by Candido Rung, RN  Outcome: Progressing     Problem: Pain  Goal: Verbalizes/displays adequate comfort level or baseline comfort level  05/23/2024 0921 by Tisa Sotero BIRCH, RN  Outcome: Progressing  Flowsheets (Taken 05/23/2024 [phone])  Verbalizes/displays adequate comfort level or baseline comfort level:   Encourage patient to monitor pain and request assistance   Assess pain using appropriate pain scale   Administer analgesics based on type and severity of  pain and evaluate response   Implement non-pharmacological measures as appropriate and evaluate response   Consider cultural and social influences on pain and pain management   Notify Licensed Independent Practitioner if interventions unsuccessful or patient reports new pain  05/22/2024 2110 by Candido Rung, RN  Outcome: Progressing     Problem: Safety - Adult  Goal: Free from fall injury  05/23/2024 0921 by Tisa Sotero BIRCH, RN  Outcome: Progressing  05/22/2024 2110 by Candido Rung, RN  Outcome: Progressing     "

## 2024-05-24 ENCOUNTER — Inpatient Hospital Stay: Admit: 2024-05-24 | Payer: Medicare (Managed Care)

## 2024-05-24 ENCOUNTER — Observation Stay: Admit: 2024-05-24 | Payer: Medicare (Managed Care)

## 2024-05-24 ENCOUNTER — Observation Stay: Admit: 2024-05-25 | Payer: Medicare (Managed Care)

## 2024-05-24 LAB — NM STRESS TEST WITH MYOCARDIAL PERFUSION
Baseline Diastolic BP: 84 mmHg
Baseline HR: 57 {beats}/min
Baseline Systolic BP: 134 mmHg
Body Surface Area: 1.87 m2
Nuc Stress EF: 66 %
Stress Stage 1 Duration: 1 min:sec
Stress Stage 1 HR: 84 {beats}/min
Stress Stage 2 Duration: 2 min:sec
Stress Stage 2 HR: 81 {beats}/min
Stress Stage 3 Duration: 3 min:sec
Stress Stage 3 HR: 81 {beats}/min
Stress Stage 4 Duration: 4 min:sec
Stress Stage 4 HR: 83 {beats}/min
Stress Target HR: 157 {beats}/min

## 2024-05-24 LAB — CBC
Hematocrit: 40.8 % (ref 35.0–47.0)
Hemoglobin: 13.6 g/dL (ref 11.5–16.0)
MCH: 29.8 pg (ref 26.0–34.0)
MCHC: 33.3 g/dL (ref 30.0–36.5)
MCV: 89.5 FL (ref 80.0–99.0)
MPV: 8.8 FL — ABNORMAL LOW (ref 8.9–12.9)
Nucleated RBCs: 0 /100{WBCs}
Platelets: 233 K/uL (ref 150–400)
RBC: 4.56 M/uL (ref 3.80–5.20)
RDW: 12.4 % (ref 11.5–14.5)
WBC: 7.9 K/uL (ref 3.6–11.0)
nRBC: 0 K/uL (ref 0.00–0.01)

## 2024-05-24 LAB — POCT GLUCOSE
POC Glucose: 253 mg/dL — ABNORMAL HIGH (ref 65–100)
POC Glucose: 118 mg/dL — ABNORMAL HIGH (ref 65–100)
POC Glucose: 150 mg/dL — ABNORMAL HIGH (ref 65–100)
POC Glucose: 208 mg/dL — ABNORMAL HIGH (ref 65–100)

## 2024-05-24 LAB — HEMOGLOBIN A1C
Estimated Avg Glucose: 243 mg/dL
Hemoglobin A1C: 10.1 % — ABNORMAL HIGH (ref 4.0–5.6)

## 2024-05-24 MED ORDER — TECHNETIUM TC 99M SESTAMIBI IV KIT
Freq: Once | INTRAVENOUS | Status: AC | PRN
Start: 2024-05-24 — End: 2024-05-24
  Administered 2024-05-24: 14:00:00 10.4 via INTRAVENOUS

## 2024-05-24 MED ORDER — HYDROXYZINE PAMOATE 25 MG PO CAPS
25 | Freq: Three times a day (TID) | ORAL | Status: DC | PRN
Start: 2024-05-24 — End: 2024-05-26
  Administered 2024-05-25: 13:00:00 25 mg via ORAL

## 2024-05-24 MED ORDER — REGADENOSON 0.4 MG/5ML IV SOLN
0.4 | INTRAVENOUS | Status: AC
Start: 2024-05-24 — End: 2024-05-24
  Administered 2024-05-24: 16:00:00 0.4

## 2024-05-24 MED ORDER — TECHNETIUM TC 99M SESTAMIBI IV KIT
Freq: Once | INTRAVENOUS | Status: AC | PRN
Start: 2024-05-24 — End: 2024-05-24
  Administered 2024-05-24: 16:00:00 33 via INTRAVENOUS

## 2024-05-24 MED FILL — ATORVASTATIN CALCIUM 40 MG PO TABS: 40 mg | ORAL | Qty: 1 | Fill #0

## 2024-05-24 MED FILL — CARVEDILOL 3.125 MG PO TABS: 3.125 mg | ORAL | Qty: 1 | Fill #0

## 2024-05-24 MED FILL — MORPHINE SULFATE 2 MG/ML IJ SOLN: 2 mg/mL | INTRAMUSCULAR | Qty: 1 | Fill #0

## 2024-05-24 MED FILL — ASPIRIN 81 MG PO TBEC: 81 mg | ORAL | Qty: 1 | Fill #0

## 2024-05-24 MED FILL — INSULIN LISPRO 100 UNIT/ML IJ SOLN: 100 [IU]/mL | INTRAMUSCULAR | Qty: 2 | Fill #0

## 2024-05-24 MED FILL — INSULIN LISPRO 100 UNIT/ML IJ SOLN: 100 [IU]/mL | INTRAMUSCULAR | Qty: 4 | Fill #0

## 2024-05-24 MED FILL — LOSARTAN POTASSIUM 50 MG PO TABS: 50 mg | ORAL | Qty: 1 | Fill #0

## 2024-05-24 MED FILL — LANTUS 100 UNIT/ML SC SOLN: 100 [IU]/mL | SUBCUTANEOUS | Qty: 35 | Fill #0

## 2024-05-24 MED FILL — REGADENOSON 0.4 MG/5ML IV SOLN: 0.4 MG/5ML | INTRAVENOUS | Qty: 5 | Fill #0

## 2024-05-24 MED FILL — ENOXAPARIN SODIUM 40 MG/0.4ML IJ SOSY: 40 MG/0.4ML | INTRAMUSCULAR | Qty: 0.4 | Fill #0

## 2024-05-24 MED FILL — LEVOTHYROXINE SODIUM 25 MCG PO TABS: 25 ug | ORAL | Qty: 1 | Fill #0

## 2024-05-24 NOTE — Plan of Care (Signed)
"    Problem: Chronic Conditions and Co-morbidities  Goal: Patient's chronic conditions and co-morbidity symptoms are monitored and maintained or improved  05/24/2024 0850 by Alona Bouchard, RN  Outcome: Progressing  Flowsheets (Taken 05/24/2024 0850)  Care Plan - Patient's Chronic Conditions and Co-Morbidity Symptoms are Monitored and Maintained or Improved:   Monitor and assess patient's chronic conditions and comorbid symptoms for stability, deterioration, or improvement   Collaborate with multidisciplinary team to address chronic and comorbid conditions and prevent exacerbation or deterioration  05/23/2024 2312 by Candido Rung, RN  Outcome: Progressing  Flowsheets (Taken 05/23/2024 1933)  Care Plan - Patient's Chronic Conditions and Co-Morbidity Symptoms are Monitored and Maintained or Improved: Monitor and assess patient's chronic conditions and comorbid symptoms for stability, deterioration, or improvement     Problem: Discharge Planning  Goal: Discharge to home or other facility with appropriate resources  05/24/2024 0850 by Alona Bouchard, RN  Outcome: Progressing  Flowsheets (Taken 05/24/2024 [phone])  Discharge to home or other facility with appropriate resources:   Identify barriers to discharge with patient and caregiver   Arrange for needed discharge resources and transportation as appropriate  05/23/2024 2312 by Candido Rung, RN  Outcome: Progressing  Flowsheets (Taken 05/23/2024 1933)  Discharge to home or other facility with appropriate resources: Identify barriers to discharge with patient and caregiver     Problem: Pain  Goal: Verbalizes/displays adequate comfort level or baseline comfort level  05/24/2024 0850 by Alona Bouchard, RN  Outcome: Progressing  Flowsheets (Taken 05/24/2024 0850)  Verbalizes/displays adequate comfort level or baseline comfort level:   Encourage patient to monitor pain and request assistance   Assess pain using appropriate pain scale  05/23/2024 2312 by Candido Rung,  RN  Outcome: Progressing     Problem: Safety - Adult  Goal: Free from fall injury  05/23/2024 2312 by Candido Rung, RN  Outcome: Progressing     "

## 2024-05-24 NOTE — Progress Notes (Signed)
 "    CARDIOLOGY PROGRESS NOTE      Patient Name: Katrina Bowen  Age: 63 y.o.  Gender:female  DOB:[birth year]  MRN: [id number]    HISTORY OF PRESENT ILLNESS:  Katrina Bowen is a 63 y.o. year-old female with past medical history significant for hypothyroidism, DM type II, hypertension, CAD s/p PCI, HFpEF, diabetic neuropathy, arthritis, fibromyalgia, overall medication noncompliance who was evaluated today due to chest pain with minimally elevated troponin. On exam, denies pain at present. States states the pain woke her up from sleep. She reports she is homeless, has transportation issues which makes it hard to follow up or to get her medications.        Records from hospital admission course thus far reviewed.       11/2020 LHC   1.  Chronic occlusion of the RCA with left-to-right collaterals, unchanged from prior studies   2.  Continued patency of the stented segment in the proximal and mid LAD with mild in-stent restenosis, no flow-limiting disease noted   3.  Significant ostial stenosis of a small diagonal branch jailed by the stented segment of LAD, appropriate for medical therapy   4.  Continued patency of the stented segment in the proximal, mid, and distal circumflex as well as the obtuse marginal branch without significant restenosis   Recommend: medical therapy     05/24/2024: Patient seen and examined in [facility] for stress testing. Patient denies further chest pain overnight. Breathing stable on room air. No other complaints reported.    Telemetry reviewed.    Pertinent review of systems items noted above, all other systems are negative. Current medications reviewed.    Physical Examination    Allergies   Allergen Reactions    Latex Rash     Use paper tape only    Amitriptyline Anaphylaxis and Other (See Comments)     Muscle Cramps      Corticosteroids Anaphylaxis     ALL steroids.         Penicillins Anaphylaxis     Has patient had a PCN reaction causing immediate rash, facial/tongue/throat swelling, SOB or  lightheadedness with hypotension: {Yes  Has patient had a PCN reaction causing severe rash involving mucus membranes or skin necrosis: NO  Has patient had a PCN reaction that required hospitalization NO  Has patient had a PCN reaction occurring within the last 10 years: {Yes  If all of the above answers are NO, then may proceed with Cephalosporin use.      Pregabalin Anaphylaxis, Shortness Of Breath and Swelling    Duloxetine Other (See Comments)     Other reaction(s): neurological reaction      Gabapentin Swelling     Vitals:    05/24/24 1048   BP: 134/84   Pulse: 57   Resp:    Temp:    SpO2:      Vital signs are stable  No apparent distress.  Heart has a RRR.  No murmur  Lungs are diminished  Abdomen is soft, nontender, normal bowel sounds.  Extremities have no edema  Skin is dry and warm.  Normal affect    Labs reviewed:  Recent Results (from the past 12 hours)   POCT Glucose    Collection Time: 05/24/24  8:25 AM   Result Value Ref Range    POC Glucose 118 (H) 65 - 100 mg/dL    Performed by: DiDomenico Savanya    Nuclear stress test with myocardial perfusion    Collection Time: 05/24/24  10:50 AM   Result Value Ref Range    Stress Target HR 157 bpm    Baseline HR 57 bpm    Baseline Systolic BP 134 mmHg    Baseline Diastolic BP 84 mmHg    Stress Stage 1 Duration 1 min:sec    Stress Stage 1 HR 84 bpm    Stress Stage 1 BP 115/66 mmHg    Stress Stage 2 Duration 2 min:sec    Stress Stage 2 HR 81 bpm    Stress Stage 3 Duration 3 min:sec    Stress Stage 3 HR 81 bpm    Stress Stage 3 BP 130/75 mmHg    Stress Stage 4 Duration 4 min:sec    Stress Stage 4 HR 83 bpm    Stress Stage 4 BP 120/85 mmHg    Body Surface Area 1.87 m2        Case discussed with Dr. Floydene and our impression and recommendations are as follows:  Elevated troponin T w/ chest pain:  Troponin peak of 22.6, suspect demand   EKG without acute ischemic changes  Given chest pain with significant CAD history and limited resources to follow up, will obtain  NST inpatient   Continue telemetry monitoring  Echo with EF 65-70%, severe LVH, NWM   Coronary artery disease:   s/p multiple stents.   LHC 2022 w/ stable disease, patent stents (outlined above).   Continue asa, statin, BB.   Hypertension: BP at goal   Continue losartan , coreg  - uptitrate as indicated.   Hyperlipidemia: continue statin  Diabetes: uncontrolled,   A1C was 15.8 in Aug, repeat pending.   Tx per primary team  Hypothyroidism: TSH 81, FT4 <0.2   Tx per primary team.  Social needs - homeless, no transportation. Need CM to assist w/ medication delivery, Medicaid application, etc.     Please do not hesitate to call if additional questions arise.    Janaiah Vetrano L, APRN - NP  05/24/2024  "

## 2024-05-24 NOTE — Progress Notes (Signed)
 "      Hospitalist Progress Note    NAME:   Katrina Bowen   DOB: 1960/09/13   MRN: [id number]     Date/Time: 05/24/2024 6:08 PM  Patient PCP: No, Pcp      Hospital Course:  Katrina Bowen is a 63 year old female with PMHx of hypothyroidism, DM type II, hypertension, CAD s/p PCI, HFpEF, diabetic neuropathy, arthritis, fibromyalgia, overall medication noncompliance who was admitted on 11/22 for chest pain.  Patient noted to have troponin elevation with a peak at 19.9.  Echo with normal ventricular systolic function and an EF of 65 to 70%, findings consistent with severe concentric hypertrophy and abnormal diastolic dysfunction.  Cardiology consulted, elevated troponin is likely secondary to demand ischemia but given patient's inability to obtain transportation and overall medication noncompliance, recommended inpatient stress test.  Patient noted to be hyperglycemic and states that she has run out of her insulin  at home, restarted on home dose of 35 units of Lantus .  A1c 10.1.  Patient has history of hypothyroidism, has not been taking levothyroxine .  TSH elevated with decreased T4, restarted on levothyroxine  125 mcg.  Patient noted to have chronic pain secondary to fibromyalgia/arthritis and diabetic neuropathy and has been placed on morphine  but is requested IV Dilaudid .  Previously has followed with the pain management service, will provide referral at discharge.  Additionally, patient with ongoing left flank and left lower quadrant abdominal pain over past several weeks that has worsened recently.  Has significant history of kidney stones.  Given duration of symptoms and difficulty with follow-up, will obtain CT abdomen/pelvis.    Assessment / Plan:  Chest pain  Type II MI  CAD s/p PCI  HTN  Mildly elevated troponin with flat, non-ACS trend is likely secondary to demand ischemia  EKG NSR rate 87 with RBBB and left posterior fascicular block, no ischemic changes  Echo EF 65 to 70%, severely increased wall thickness  consistent with severe concentric hypertrophy, NWM  NST negative for myocardial ischemia  Continue aspirin , Lipitor , Coreg , losartan   Continue telemetry  Cardiology cleared for discharge    Uncontrolled hypothyroidism  TSH elevated at 81, T4 decreased at <0.2  Likely secondary to medication noncompliance  Restart levothyroxine  at 125 mcg  Follow-up with PCP for repeat lab work in 4 to 6 weeks    Uncontrolled DM type II  Patient reports 35 units of Lantus  nightly and 6 units of short acting during mealtimes but has not been compliant  A1c 15.8 in August, improved to 10.1  Stop metformin  as she notes this resulted in GI upset  Medium dose insulin  sliding scale with Accu-Cheks  Restart home dose of Lantus   Monitor for signs and symptoms of hypoglycemia    Diabetic neuropathy  Arthritis  Fibromyalgia  Per patient has previously followed up with pain management, will provide referral at discharge  Per chart review, patient has several allergic reactions to amitriptyline, steroids, Lyrica, duloxetine and gabapentin    LLQ pain  Left flank pain  History of recurrent kidney stones  Endorses ongoing left flank pain and LLQ pain for several weeks but seems to have worsened recently  No nausea or vomiting, dysuria or changes to bowel movements  Obtain CT abdomen/pelvis given duration of symptoms  Check UA    Overall medication noncompliance  Patient is unhoused, living in [place], and has lack of transportation to pick up medication refills  Will need multiple refills of medications upon discharge to encourage compliance    Anxiety  Vistaril  as needed      Medical Decision Making:   [x]  High (any 2)    A. Problems (any 1)  [x]  Acute/Chronic Illness/injury posing threat to life or bodily function: Chest pain  []  Severe exacerbation of chronic illness:    ---------------------------------------------------------------------  B. Risk of Treatment (any 1)   [x]  Drugs/treatments that require intensive monitoring for toxicity include:     []  IV ABX requiring serial renal monitoring for nephrotoxicity:     []  IV Narcotic analgesia for adverse drug reaction  []  Aggressive IV diuresis requiring serial monitoring for renal impairment and electrolyte derangements  []  Critical electrolyte abnormalities requiring IV replacement and close serial monitoring  [x]  SQ Insulin  SS- monitoring serial FSBS for Hypoglycemic adverse drug reaction  []  Other -   []  Change in code status:    []  Decision to escalate care:    []  Major surgery/procedure with associated risk factors:    ----------------------------------------------------------------------  C. Data (any 2)  [x]  Discussed current management and discharge planning options with Case Management.  [x]  Discussed management of the case with: Patient, RN, cardiology  [x]  Telemetry personally reviewed and interpreted as documented above    [x]  Imaging personally reviewed and interpreted, includes:    []  Data Review (any 3)  []  All available Consultant notes from yesterday/today were reviewed  []  All current labs were reviewed and interpreted for clinical significance   []  Appropriate follow-up labs were ordered  []  Collateral history obtained from:       Code Status: Full  DVT Prophylaxis: Lovenox   GI Prophylaxis: Not indicated    Subjective:     Chief Complaint / Reason for Physician Visit  Chart reviewed and discussed with RN events overnight.  Patient seen at bedside sitting in chair.  Has mild midsternal chest pain that radiates to left shoulder.  Reports she also has left flank pain that radiates to abdomen with history of kidney stones.  She is concerned that she has another kidney stone.  No changes to urination or defecation and no nausea/vomiting.  She is feeling very anxious    Objective:     VITALS:   Last 24hrs VS reviewed since prior progress note. Most recent are:  Patient Vitals for the past 24 hrs:   BP Temp Temp src Pulse Resp SpO2 Height Weight   05/24/24 1633 -- -- -- -- 20 -- -- --   05/24/24  1547 118/62 98.1 F (36.7 C) Oral 82 17 96 % -- --   05/24/24 1247 -- -- -- -- 18 -- -- --   05/24/24 1227 (!) 136/93 97.9 F (36.6 C) Oral 85 18 100 % -- --   05/24/24 1217 -- -- -- -- 18 -- -- --   05/24/24 1048 134/84 -- -- 57 -- -- 1.575 m (5' 2) 79.8 kg (176 lb)   05/24/24 0800 106/72 97.7 F (36.5 C) Oral 74 18 98 % -- --   05/24/24 0727 -- -- -- -- 17 -- -- --   05/24/24 0531 -- -- -- -- -- -- -- 80.2 kg (176 lb 12.9 oz)   05/24/24 0321 126/88 98.8 F (37.1 C) Oral 99 18 99 % -- --   05/24/24 0220 -- -- -- -- 18 -- -- --   05/24/24 0021 125/80 97.8 F (36.6 C) Oral 99 18 97 % -- --   05/23/24 2104 -- -- -- -- 17 -- -- --   05/23/24 2010 112/65 97.7 F (36.5 C) Oral 97  17 95 % -- --         Intake/Output Summary (Last 24 hours) at 05/24/2024 1808  Last data filed at 05/24/2024 0825  Gross per 24 hour   Intake 60 ml   Output --   Net 60 ml        I had a face to face encounter and independently examined this patient on 05/24/2024, as outlined below:    Review of Systems   Constitutional:  Positive for fatigue. Negative for activity change and appetite change.   Respiratory:  Positive for chest tightness. Negative for shortness of breath.    Cardiovascular:  Positive for chest pain. Negative for palpitations and leg swelling.   Gastrointestinal:  Positive for abdominal pain. Negative for diarrhea, nausea and vomiting.   Genitourinary:  Positive for flank pain. Negative for dysuria, hematuria and urgency.   Musculoskeletal:  Positive for arthralgias and myalgias.   Neurological:  Negative for weakness and headaches. Numbness: Bilateral lower extremities.  Psychiatric/Behavioral:  Negative for behavioral problems, dysphoric mood and suicidal ideas.       PHYSICAL EXAM:  Physical Exam  Vitals and nursing note reviewed.   Constitutional:       Appearance: Normal appearance.   Eyes:      Extraocular Movements: Extraocular movements intact.   Cardiovascular:      Rate and Rhythm: Normal rate and regular rhythm.       Heart sounds: Normal heart sounds. No murmur heard.  Pulmonary:      Effort: Pulmonary effort is normal. No respiratory distress.      Breath sounds: Normal breath sounds. No wheezing.   Abdominal:      General: Abdomen is flat. There is no distension.      Palpations: Abdomen is soft. There is no mass.      Tenderness: There is abdominal tenderness. There is left CVA tenderness. There is no guarding.      Hernia: No hernia is present.   Musculoskeletal:      Right lower leg: No edema.      Left lower leg: No edema.   Skin:     General: Skin is warm.   Neurological:      Mental Status: She is alert and oriented to person, place, and time.       Reviewed most current lab test results and cultures  YES  Reviewed most current radiology test results   YES  Review and summation of old records today    NO  Reviewed patient's current orders and MAR    YES  PMH/SH reviewed - no change compared to H&P  ________________________________________________________________________  Care Plan discussed with:    Comments   Patient x    Family      RN x    Care Manager     Consultant                        Multidiciplinary team rounds were held today with case manager, nursing, pharmacist and higher education careers adviser.  Patient's plan of care was discussed; medications were reviewed and discharge planning was addressed.     ________________________________________________________________________  Total NON critical care TIME:  35  Minutes    Total CRITICAL CARE TIME Spent:   Minutes non procedure based      Comments   >50% of visit spent in counseling and coordination of care     ________________________________________________________________________  Lum DELENA Neth, PA-C     Procedures:  see electronic medical records for all procedures/Xrays and details which were not copied into this note but were reviewed prior to creation of Plan.      LABS:  I reviewed today's most current labs and imaging studies.  Pertinent labs  include:  Recent Labs     05/22/24  1345   WBC 8.2   HGB 12.9   HCT 38.3   PLT 202     Recent Labs     05/22/24  1345 05/23/24  0315   NA 134* 135*   K 5.0 3.8   CL 100 99   CO2 21 27   BUN 18 16   PHOS  --  2.7   ALT 25  --        Signed: Lum DELENA Neth, PA-C  "

## 2024-05-24 NOTE — Care Coordination (Signed)
"  CM reviewed chart and noted patient has been staying in a motel with her mother and her grandson.     She is followed by cardiology and is scheduled for a stress test today. CM will follow up with patient once she returns to the unit.     CM received consult for assistance with transportation for appointments and meds. CM will discuss medicaid application with patient.   "

## 2024-05-24 NOTE — Progress Notes (Signed)
"  Spiritual Health Attempted Visit Note  [place]      Room # 481/01    Name: Katrina Bowen           Age: 63 y.o.    Gender: female          MRN: [id number]  Religion: Methodist       Preferred Language: English      Date: 05/24/24  Visit Time: Begin Time: (P) 0952 End Time : (P) 0957 Complexity of Encounter: (P) Low      Visit Summary: Chaplain attempted to meet with patient response to IDT rounds. Patient was out of room. Chaplain will continue to follow/ attempt to visit at another time. Chaplain available for follow-up as needed.      Patient was not available. Patient was working with other staff/was asleep/ indisposed. Chaplain will follow-up at a later time.        Electronically Signed By: Elia Resident Quinton Vicci Raddle. MDIV on 05/24/2024 at 11:52 AM  Spiritual Health  [facility]  Please call the Operator at [phone] to page the Chaplain        "

## 2024-05-25 LAB — CBC
Hematocrit: 37.5 % (ref 35.0–47.0)
Hemoglobin: 12.6 g/dL (ref 11.5–16.0)
MCH: 29.7 pg (ref 26.0–34.0)
MCHC: 33.6 g/dL (ref 30.0–36.5)
MCV: 88.4 FL (ref 80.0–99.0)
MPV: 8.8 FL — ABNORMAL LOW (ref 8.9–12.9)
Nucleated RBCs: 0 /100{WBCs}
Platelets: 206 K/uL (ref 150–400)
RBC: 4.24 M/uL (ref 3.80–5.20)
RDW: 12.4 % (ref 11.5–14.5)
WBC: 6.7 K/uL (ref 3.6–11.0)
nRBC: 0 K/uL (ref 0.00–0.01)

## 2024-05-25 LAB — COMPREHENSIVE METABOLIC PANEL
ALT: 19 U/L (ref 10–35)
ALT: 16 U/L (ref 10–35)
AST: 16 U/L (ref 10–35)
AST: 17 U/L (ref 10–35)
Albumin/Globulin Ratio: 1 — ABNORMAL LOW (ref 1.1–2.2)
Albumin/Globulin Ratio: 0.9 — ABNORMAL LOW (ref 1.1–2.2)
Albumin: 3.4 g/dL — ABNORMAL LOW (ref 3.5–5.2)
Albumin: 2.8 g/dL — ABNORMAL LOW (ref 3.5–5.2)
Alk Phosphatase: 63 U/L (ref 35–104)
Alk Phosphatase: 57 U/L (ref 35–104)
Anion Gap: 8 mmol/L (ref 2–14)
Anion Gap: 9 mmol/L (ref 2–14)
BUN/Creatinine Ratio: 15 (ref 12–20)
BUN/Creatinine Ratio: 21 — ABNORMAL HIGH (ref 12–20)
BUN: 14 mg/dL (ref 8–23)
BUN: 15 mg/dL (ref 8–23)
CO2: 30 mmol/L — ABNORMAL HIGH (ref 20–29)
CO2: 25 mmol/L (ref 20–29)
Calcium: 9.4 mg/dL (ref 8.8–10.2)
Calcium: 8.5 mg/dL — ABNORMAL LOW (ref 8.8–10.2)
Chloride: 100 mmol/L (ref 98–107)
Chloride: 100 mmol/L (ref 98–107)
Creatinine: 0.94 mg/dL (ref 0.60–1.00)
Creatinine: 0.7 mg/dL (ref 0.60–1.00)
Est, Glom Filt Rate: 68 ml/min/1.73m2 (ref >59)
Est, Glom Filt Rate: >90 ml/min/1.73m2 (ref >59)
Globulin: 3.5 g/dL (ref 2.0–4.0)
Globulin: 3 g/dL (ref 2.0–4.0)
Glucose: 154 mg/dL — ABNORMAL HIGH (ref 65–100)
Glucose: 181 mg/dL — ABNORMAL HIGH (ref 65–100)
Potassium: 3.7 mmol/L (ref 3.5–5.1)
Potassium: 3.7 mmol/L (ref 3.5–5.1)
Sodium: 138 mmol/L (ref 136–145)
Sodium: 134 mmol/L — ABNORMAL LOW (ref 136–145)
Total Bilirubin: 0.2 mg/dL (ref 0.0–1.2)
Total Bilirubin: <0.2 mg/dL (ref 0.0–1.2)
Total Protein: 6.9 g/dL (ref 6.4–8.3)
Total Protein: 5.8 g/dL — ABNORMAL LOW (ref 6.4–8.3)

## 2024-05-25 LAB — URINALYSIS WITH REFLEX TO CULTURE
BACTERIA, URINE: NEGATIVE /HPF
Bilirubin, Urine: NEGATIVE
Blood, Urine: NEGATIVE
Glucose, Ur: NEGATIVE mg/dL
Ketones, Urine: NEGATIVE mg/dL
Leukocyte Esterase, Urine: NEGATIVE
Nitrite, Urine: NEGATIVE
Protein, UA: NEGATIVE mg/dL
Specific Gravity, UA: >1.03 — ABNORMAL HIGH (ref 1.003–1.030)
Urobilinogen, Urine: 0.1 EU/dL (ref 0.1–1.0)
pH, Urine: 6 (ref 5.0–8.0)

## 2024-05-25 LAB — POCT GLUCOSE
POC Glucose: 241 mg/dL — ABNORMAL HIGH (ref 65–100)
POC Glucose: 271 mg/dL — ABNORMAL HIGH (ref 65–100)
POC Glucose: 252 mg/dL — ABNORMAL HIGH (ref 65–100)
POC Glucose: 185 mg/dL — ABNORMAL HIGH (ref 65–100)

## 2024-05-25 MED ORDER — HYDROXYZINE PAMOATE 25 MG PO CAPS
25 | ORAL_CAPSULE | Freq: Three times a day (TID) | ORAL | 0 refills | 30.00000 days | Status: AC | PRN
Start: 2024-05-25 — End: 2024-06-24

## 2024-05-25 MED ORDER — INSULIN PEN NEEDLE 32G X 4 MM MISC
Freq: Every day | 2 refills | 88.00000 days | Status: AC
Start: 2024-05-25 — End: ?

## 2024-05-25 MED ORDER — LANTUS SOLOSTAR 100 UNIT/ML SC SOPN
100 | Freq: Every day | SUBCUTANEOUS | 1 refills | 50.00000 days | Status: AC
Start: 2024-05-25 — End: ?

## 2024-05-25 MED ORDER — LEVOTHYROXINE SODIUM 125 MCG PO TABS
125 | ORAL_TABLET | Freq: Every day | ORAL | 1 refills | 90.00000 days | Status: AC
Start: 2024-05-25 — End: ?

## 2024-05-25 MED ORDER — ASPIRIN 81 MG PO TBEC
81 | ORAL_TABLET | Freq: Every day | ORAL | 1 refills | 30.00000 days | Status: AC
Start: 2024-05-25 — End: ?

## 2024-05-25 MED ORDER — IOPAMIDOL 76 % IV SOLN
76 | Freq: Once | INTRAVENOUS | Status: AC | PRN
Start: 2024-05-25 — End: 2024-05-24
  Administered 2024-05-25: 04:00:00 100 mL via INTRAVENOUS

## 2024-05-25 MED ORDER — ATORVASTATIN CALCIUM 40 MG PO TABS
40 | ORAL_TABLET | Freq: Every evening | ORAL | 1 refills | 90.00000 days | Status: AC
Start: 2024-05-25 — End: ?

## 2024-05-25 MED ORDER — BLOOD GLUCOSE TEST VI STRP
ORAL_STRIP | 0 refills | 33.00000 days | Status: AC
Start: 2024-05-25 — End: ?

## 2024-05-25 MED ORDER — TRAMADOL HCL 50 MG PO TABS
50 | Freq: Four times a day (QID) | ORAL | Status: DC | PRN
Start: 2024-05-25 — End: 2024-05-25
  Administered 2024-05-25: 18:00:00 50 mg via ORAL

## 2024-05-25 MED ORDER — CARVEDILOL 3.125 MG PO TABS
3.125 | ORAL_TABLET | Freq: Two times a day (BID) | ORAL | 1 refills | 30.00000 days | Status: AC
Start: 2024-05-25 — End: ?

## 2024-05-25 MED ORDER — LOSARTAN POTASSIUM 50 MG PO TABS
50 | ORAL_TABLET | Freq: Every day | ORAL | 1 refills | 90.00000 days | Status: AC
Start: 2024-05-25 — End: ?

## 2024-05-25 MED ORDER — EMPAGLIFLOZIN 25 MG PO TABS
25 | ORAL_TABLET | Freq: Every day | ORAL | 1 refills | 30.00000 days | Status: AC
Start: 2024-05-25 — End: ?

## 2024-05-25 MED ORDER — OXYCODONE-ACETAMINOPHEN 5-325 MG PO TABS
5-325 | Freq: Four times a day (QID) | ORAL | Status: DC | PRN
Start: 2024-05-25 — End: 2024-05-26
  Administered 2024-05-26 (×4): 1 via ORAL

## 2024-05-25 MED FILL — ENOXAPARIN SODIUM 40 MG/0.4ML IJ SOSY: 40 MG/0.4ML | INTRAMUSCULAR | Qty: 0.4 | Fill #0

## 2024-05-25 MED FILL — ASPIRIN 81 MG PO TBEC: 81 mg | ORAL | Qty: 1 | Fill #0

## 2024-05-25 MED FILL — ATORVASTATIN CALCIUM 40 MG PO TABS: 40 mg | ORAL | Qty: 1 | Fill #0

## 2024-05-25 MED FILL — LOSARTAN POTASSIUM 50 MG PO TABS: 50 mg | ORAL | Qty: 1 | Fill #0

## 2024-05-25 MED FILL — ISOVUE-370 76 % IV SOLN: 76 % | INTRAVENOUS | Qty: 100 | Fill #0

## 2024-05-25 MED FILL — LANTUS 100 UNIT/ML SC SOLN: 100 [IU]/mL | SUBCUTANEOUS | Qty: 35 | Fill #0

## 2024-05-25 MED FILL — INSULIN LISPRO 100 UNIT/ML IJ SOLN: 100 [IU]/mL | INTRAMUSCULAR | Qty: 2 | Fill #0

## 2024-05-25 MED FILL — MORPHINE SULFATE 2 MG/ML IJ SOLN: 2 mg/mL | INTRAMUSCULAR | Qty: 1 | Fill #0

## 2024-05-25 MED FILL — INSULIN LISPRO 100 UNIT/ML IJ SOLN: 100 [IU]/mL | INTRAMUSCULAR | Qty: 4 | Fill #0

## 2024-05-25 MED FILL — CARVEDILOL 3.125 MG PO TABS: 3.125 mg | ORAL | Qty: 1 | Fill #0

## 2024-05-25 MED FILL — HYDROXYZINE PAMOATE 25 MG PO CAPS: 25 mg | ORAL | Qty: 1 | Fill #0

## 2024-05-25 MED FILL — LEVOTHYROXINE SODIUM 25 MCG PO TABS: 25 ug | ORAL | Qty: 1 | Fill #0

## 2024-05-25 MED FILL — TRAMADOL HCL 50 MG PO TABS: 50 mg | ORAL | Qty: 1 | Fill #0

## 2024-05-25 NOTE — Plan of Care (Signed)
"    Problem: Chronic Conditions and Co-morbidities  Goal: Patient's chronic conditions and co-morbidity symptoms are monitored and maintained or improved  05/25/2024 0755 by Tisa Sotero BIRCH, RN  Outcome: Progressing  Flowsheets (Taken 05/25/2024 [phone])  Care Plan - Patient's Chronic Conditions and Co-Morbidity Symptoms are Monitored and Maintained or Improved:   Monitor and assess patient's chronic conditions and comorbid symptoms for stability, deterioration, or improvement   Collaborate with multidisciplinary team to address chronic and comorbid conditions and prevent exacerbation or deterioration   Update acute care plan with appropriate goals if chronic or comorbid symptoms are exacerbated and prevent overall improvement and discharge  05/25/2024 0338 by Veronda Annabella BIRCH, RN  Outcome: Progressing  05/25/2024 0337 by Veronda Annabella BIRCH, RN  Outcome: Progressing  05/25/2024 0335 by Veronda Annabella BIRCH, RN  Outcome: Progressing  05/25/2024 0334 by Veronda Annabella BIRCH, RN  Outcome: Progressing     Problem: Discharge Planning  Goal: Discharge to home or other facility with appropriate resources  05/25/2024 0755 by Tisa Sotero BIRCH, RN  Outcome: Progressing  Flowsheets (Taken 05/25/2024 [phone])  Discharge to home or other facility with appropriate resources:   Identify barriers to discharge with patient and caregiver   Arrange for needed discharge resources and transportation as appropriate   Identify discharge learning needs (meds, wound care, etc)   Refer to discharge planning if patient needs post-hospital services based on physician order or complex needs related to functional status, cognitive ability or social support system  05/25/2024 0338 by Veronda Annabella BIRCH, RN  Outcome: Progressing  05/25/2024 0337 by Veronda Annabella D, RN  Outcome: Progressing  05/25/2024 0335 by Veronda Annabella D, RN  Outcome: Progressing  05/25/2024 0334 by Veronda Annabella BIRCH, RN  Outcome: Progressing     Problem:  Pain  Goal: Verbalizes/displays adequate comfort level or baseline comfort level  05/25/2024 0755 by Tisa Sotero BIRCH, RN  Outcome: Progressing  Flowsheets (Taken 05/25/2024 0730)  Verbalizes/displays adequate comfort level or baseline comfort level:   Encourage patient to monitor pain and request assistance   Assess pain using appropriate pain scale   Administer analgesics based on type and severity of pain and evaluate response   Implement non-pharmacological measures as appropriate and evaluate response   Consider cultural and social influences on pain and pain management   Notify Licensed Independent Practitioner if interventions unsuccessful or patient reports new pain  05/25/2024 0338 by Veronda Annabella BIRCH, RN  Outcome: Progressing  05/25/2024 0337 by Veronda Annabella BIRCH, RN  Outcome: Progressing  05/25/2024 0335 by Veronda Annabella BIRCH, RN  Outcome: Progressing  05/25/2024 0334 by Veronda Annabella BIRCH, RN  Outcome: Progressing     Problem: Safety - Adult  Goal: Free from fall injury  05/25/2024 0755 by Tisa Sotero BIRCH, RN  Outcome: Progressing  05/25/2024 0338 by Veronda Annabella BIRCH, RN  Outcome: Progressing  05/25/2024 0337 by Veronda Annabella BIRCH, RN  Outcome: Progressing  05/25/2024 0335 by Veronda Annabella BIRCH, RN  Outcome: Progressing  05/25/2024 0334 by Veronda Annabella BIRCH, RN  Outcome: Progressing     "

## 2024-05-25 NOTE — Discharge Summary (Signed)
 "                    Hospitalist Discharge Summary      Name: Katrina Bowen  [id number]  Date of Birth: Feb 28, 1961 (Age: 63 y.o.)   Date of Admission: 05/22/2024  Date of Discharge: 05/25/2024  Attending Physician: Ronette Leyland, MD    Discharge Diagnoses:   Principal Problem:    Chest pain  Active Problems:    Uncontrolled type 2 diabetes mellitus with hyperglycemia (HCC)  Resolved Problems:    * No resolved hospital problems. *  Chest pain  Type II MI  CAD s/p PCI  HTN uncontrolled hypothyroidism  Uncontrolled type II DM  Diabetic neuropathy  Arthritis  Fibromyalgia  LLQ abdominal pain  Left flank pain  History of kidney stones  Medication noncompliance    Consultations:  IP CONSULT TO SOCIAL WORK  IP CONSULT TO SPIRITUAL SERVICES  IP CONSULT TO CARDIOLOGY      Brief Admission History/Reason for Admission Per Katrina Rump, MD:   Chest pain    Brief Hospital Course:    Katrina Bowen is a 63 year old female with PMHx of hypothyroidism, DM type II, hypertension, CAD s/p PCI, HFpEF, diabetic neuropathy, arthritis, fibromyalgia, overall medication noncompliance who was admitted on 11/22 for chest pain.  Patient noted to have troponin elevation with a peak at 19.9.  Echo with normal ventricular systolic function and an EF of 65 to 70%, findings consistent with severe concentric hypertrophy and abnormal diastolic dysfunction.  Cardiology consulted, elevated troponin is likely secondary to demand ischemia but given patient's inability to obtain transportation and overall medication noncompliance, recommended inpatient stress test.  Patient noted to be hyperglycemic and states that she has run out of her insulin  at home, restarted on home dose of 35 units of Lantus .  A1c 10.1.  Patient has history of hypothyroidism, has not been taking levothyroxine .  TSH elevated with decreased T4, restarted on levothyroxine  125 mcg.  Patient noted to have chronic pain secondary to fibromyalgia/arthritis and diabetic neuropathy and  has been placed on morphine  but is requested IV Dilaudid .  Previously has followed with the pain management service, will provide referral at discharge.  Additionally, patient with ongoing left flank and left lower quadrant abdominal pain over past several weeks that has worsened recently with significant history of kidney stones.  Given duration of symptoms and difficulty with follow-up, CT A/P obtained which showed no acute intraperitoneal process but did incidentally show punctate nonobstructive right renal calculus and trace bilateral pleural effusions with trace pericardial effusion.  UA without evidence of UTI.     Cardiology has cleared patient for discharge.  Advised patient to follow-up with PCP in 10 to 14 days with recent admission for continued monitoring and management of medications and other comorbidities.  PCP referral provided.  Specialist follow up instructions/appointments as below.  Pain management referral provided.  Discussed discharge plan with patient, which patient agreed to.  All questions answered and patient will be discharged home pending delivery of medications from [facility], unfortunately Jardiance  and insulin  will not be available until tomorrow so medications will be delivered tomorrow.    Discharge Exam:   Patient seen and examined by me on discharge day.  No new complaints.      Gen:    Not in distress  Chest: Clear lungs  CVS:   Regular rhythm.  No edema.  Abd:  Soft, not distended, not tender.  Neuro: At baseline    Vitals:  Patient Vitals for the past 24 hrs:   BP Temp Temp src Pulse Resp SpO2 Height Weight   05/25/24 0815 121/89 97.7 F (36.5 C) Oral 79 20 100 % -- --   05/25/24 0722 -- -- -- -- 18 -- -- --   05/25/24 0315 (!) 145/84 97.3 F (36.3 C) Oral 78 20 99 % -- 82 kg (180 lb 12.4 oz)   05/25/24 0300 -- -- -- -- -- -- -- 81.8 kg (180 lb 5.4 oz)   05/25/24 0145 -- -- -- -- 16 -- -- --   05/24/24 2334 97/61 97.2 F (36.2 C) Oral 76 16 96 % -- --   05/24/24  2109 -- -- -- -- 16 -- -- --   05/24/24 2029 105/81 -- -- 85 -- 95 % -- --   05/24/24 1938 103/73 98.2 F (36.8 C) Oral 85 20 97 % -- --   05/24/24 1703 -- -- -- -- 18 -- -- --   05/24/24 1633 -- -- -- -- 20 -- -- --   05/24/24 1547 118/62 98.1 F (36.7 C) Oral 82 17 96 % -- --   05/24/24 1247 -- -- -- -- 18 -- -- --   05/24/24 1227 (!) 136/93 97.9 F (36.6 C) Oral 85 18 100 % -- --   05/24/24 1217 -- -- -- -- 18 -- -- --   05/24/24 1048 134/84 -- -- 57 -- -- 1.575 m (5' 2) 79.8 kg (176 lb)       Recent Laboratory Results:  Recent Labs     05/23/24  0315 05/24/24  1805 05/25/24  0104   NA 135*   < > 134*   K 3.8   < > 3.7   CL 99   < > 100   CO2 27   < > 25   BUN 16   < > 15   CREATININE 0.69   < > 0.70   GLUCOSE 247*   < > 181*   CALCIUM  9.1   < > 8.5*   PHOS 2.7  --   --     < > = values in this interval not displayed.     Recent Labs     05/25/24  0104   HGB 12.6   HCT 37.5   WBC 6.7   PLT 206         Discharge Medications:        Medication List        START taking these medications      hydrOXYzine  pamoate 25 MG capsule  Commonly known as: VISTARIL   Take 1 capsule by mouth 3 times daily as needed for Anxiety            CONTINUE taking these medications      acetaminophen  500 MG tablet  Commonly known as: TYLENOL   Take 2 tablets by mouth every 6 hours as needed for Pain     aspirin  81 MG EC tablet  Take 1 tablet by mouth daily     atorvastatin  40 MG tablet  Commonly known as: LIPITOR   Take 1 tablet by mouth nightly     Blood Glucose Monitor System w/Device Kit  Pharmacist to identify preferred meter and strips.     blood glucose test strips  Test 2-3 times a day & as needed for symptoms of irregular blood glucose. Dispense sufficient amount for indicated testing frequency plus additional to accommodate PRN testing needs. Pharmacist to identify preferred brand.  carvedilol  3.125 MG tablet  Commonly known as: COREG   Take 1 tablet by mouth 2 times daily (with meals)     empagliflozin  25 MG tablet  Commonly  known as: Jardiance   Take 1 tablet by mouth daily     insulin  lispro (1 Unit Dial ) 100 UNIT/ML Sopn  Commonly known as: HumaLOG  KwikPen  Inject 10 Units into the skin 3 times daily (before meals)     Insulin  Pen Needle 32G X 4 MM Misc  1 each by Does not apply route daily     Lancets 30G Misc  Test 2-3 times a day & as needed for symptoms of irregular blood glucose. Dispense sufficient amount for indicated testing frequency plus additional to accommodate PRN testing needs. Pharmacist to identify preferred brand.     Lantus  SoloStar 100 UNIT/ML injection pen  Generic drug: insulin  glargine  Inject 35 Units into the skin daily     levothyroxine  125 MCG tablet  Commonly known as: SYNTHROID   Take 1 tablet by mouth every morning (before breakfast)     linaCLOtide  72 MCG Caps capsule  Commonly known as: LINZESS   Take 1 capsule by mouth every morning (before breakfast)     losartan  50 MG tablet  Commonly known as: COZAAR   Take 1 tablet by mouth daily            STOP taking these medications      clopidogrel  75 MG tablet  Commonly known as: PLAVIX      furosemide  40 MG tablet  Commonly known as: LASIX      ketorolac  10 MG tablet  Commonly known as: TORADOL      ondansetron  4 MG disintegrating tablet  Commonly known as: ZOFRAN -ODT            ASK your doctor about these medications      albuterol  sulfate HFA 108 (90 Base) MCG/ACT inhaler  Commonly known as: Proventil  HFA  Inhale 2 puffs into the lungs every 4 hours as needed for Wheezing     diclofenac sodium 1 % Gel  Commonly known as: VOLTAREN     sertraline  50 MG tablet  Commonly known as: ZOLOFT   Take 1 tablet by mouth daily               Where to Get Your Medications        These medications were sent to [facility] - [place], [place] - [address] - P [phone] [place] [phone]  [address] [place], [place] [address]      Phone: [phone]   aspirin  81 MG EC tablet  atorvastatin  40 MG tablet  blood glucose test strips  carvedilol  3.125 MG  tablet  empagliflozin  25 MG tablet  hydrOXYzine  pamoate 25 MG capsule  Insulin  Pen Needle 32G X 4 MM Misc  Lantus  SoloStar 100 UNIT/ML injection pen  levothyroxine  125 MCG tablet  losartan  50 MG tablet           Disposition:     Home with Family:    x   Home with HH/PT/OT/RN:    SNF/LTC:    SAHR:    Other:      Discharge Recommendations:   Code Status: Full  Recommended Diet: cardiac diet and diabetic diet  Recommended Activity: activity as tolerated  Wound Care: None  Discharge instructions specific to the patient's condition have been attached for AVS.    Follow-Ups:   PCP : No, Pcp    Mohiuddin, Karlene, MD  [address]  [place] [address]  [phone]    Schedule an appointment as soon as possible for a visit in 1 week(s)  Establish care with PCP above or of choice in 10 to 14 days given recent hospitalization for continued monitoring and management of medications and other comorbidities.    [facility]  [address] [place]  [address]  [place]  [phone]  [phone]  Schedule an appointment as soon as possible for a visit in 1 month(s)  Follow-up with cardiologist for continued monitoring and management of CAD    Long, Garnette SQUIBB, MD  [address]  [employer] [place] [address]  [phone]    Schedule an appointment as soon as possible for a visit in 1 week(s)  Establish care with pain management specialist for continued management of fibromyalgia, arthritis and neuropathy    Long, Garnette SQUIBB, MD  [address]  [place] [address]  [phone]            Total time in minutes spent coordinating this discharge (includes going over instructions, follow-up, prescriptions, and preparing report for sign off to PCP) :  35 minutes    SIGNED: Lum DELENA Neth, PA-C  "

## 2024-05-25 NOTE — Plan of Care (Signed)
"    Problem: Chronic Conditions and Co-morbidities  Goal: Patient's chronic conditions and co-morbidity symptoms are monitored and maintained or improved  05/25/2024 2017 by Kathrene Dais, RN  Outcome: Progressing  05/25/2024 0755 by Tisa Sotero BIRCH, RN  Outcome: Progressing  Flowsheets (Taken 05/25/2024 [phone])  Care Plan - Patient's Chronic Conditions and Co-Morbidity Symptoms are Monitored and Maintained or Improved:   Monitor and assess patient's chronic conditions and comorbid symptoms for stability, deterioration, or improvement   Collaborate with multidisciplinary team to address chronic and comorbid conditions and prevent exacerbation or deterioration   Update acute care plan with appropriate goals if chronic or comorbid symptoms are exacerbated and prevent overall improvement and discharge     Problem: Discharge Planning  Goal: Discharge to home or other facility with appropriate resources  05/25/2024 2017 by Kathrene Dais, RN  Outcome: Progressing  05/25/2024 0755 by Tisa Sotero BIRCH, RN  Outcome: Progressing  Flowsheets (Taken 05/25/2024 [phone])  Discharge to home or other facility with appropriate resources:   Identify barriers to discharge with patient and caregiver   Arrange for needed discharge resources and transportation as appropriate   Identify discharge learning needs (meds, wound care, etc)   Refer to discharge planning if patient needs post-hospital services based on physician order or complex needs related to functional status, cognitive ability or social support system     Problem: Pain  Goal: Verbalizes/displays adequate comfort level or baseline comfort level  05/25/2024 2017 by Kathrene Dais, RN  Outcome: Progressing  05/25/2024 0755 by Tisa Sotero BIRCH, RN  Outcome: Progressing  Flowsheets (Taken 05/25/2024 0730)  Verbalizes/displays adequate comfort level or baseline comfort level:   Encourage patient to monitor pain and request assistance   Assess pain using appropriate pain scale    Administer analgesics based on type and severity of pain and evaluate response   Implement non-pharmacological measures as appropriate and evaluate response   Consider cultural and social influences on pain and pain management   Notify Licensed Independent Practitioner if interventions unsuccessful or patient reports new pain     Problem: Safety - Adult  Goal: Free from fall injury  05/25/2024 2017 by Kathrene Dais, RN  Outcome: Progressing  05/25/2024 0755 by Tisa Sotero BIRCH, RN  Outcome: Progressing     "

## 2024-05-25 NOTE — Plan of Care (Signed)
"    Problem: Chronic Conditions and Co-morbidities  Goal: Patient's chronic conditions and co-morbidity symptoms are monitored and maintained or improved  05/25/2024 0335 by Veronda Annabella BIRCH, RN  Outcome: Progressing  05/25/2024 0334 by Veronda Annabella BIRCH, RN  Outcome: Progressing     Problem: Discharge Planning  Goal: Discharge to home or other facility with appropriate resources  05/25/2024 0335 by Veronda Annabella BIRCH, RN  Outcome: Progressing  05/25/2024 0334 by Veronda Annabella BIRCH, RN  Outcome: Progressing     Problem: Pain  Goal: Verbalizes/displays adequate comfort level or baseline comfort level  05/25/2024 0335 by Veronda Annabella BIRCH, RN  Outcome: Progressing  05/25/2024 0334 by Veronda Annabella BIRCH, RN  Outcome: Progressing     Problem: Safety - Adult  Goal: Free from fall injury  05/25/2024 0335 by Veronda Annabella BIRCH, RN  Outcome: Progressing  05/25/2024 0334 by Veronda Annabella BIRCH, RN  Outcome: Progressing     "

## 2024-05-25 NOTE — Care Coordination (Signed)
"  CM noted discharge order. Patient lives in a motel with her mom and grandson and will return.     CM also noted consult for CM to assist with meds. CM is unable to assist with payment of meds when patient has medical insurance as in this instance. Patient is however more than welcome to use [facility] to have her meds delivered to the hospital prior to discharge.    Transition of Care Plan:    RUR: obs  Prior Level of Functioning: independent  Disposition: motel  EDD: today  If SNF or IPR: Date Freedom of Choice offered: n/a  Date Freedom of Choice received: n/a  Accepting facility: n/a  Date authorization started with reference number: n/a  Date authorization received and expires:n/a   Follow up appointments: yes  DME needed: n/a  Transportation at discharge: cab  Ambulance transport?n/a  Pt and family notified of potential financial responsibility for transport?          Who?  IM/IMM Medicare/Tricare letter given: n/a  Is patient a Veteran and connected with VA? N/a   If yes, was [employer] transfer form completed and VA notified? N/a  Caregiver Contact: n/a  Discharge Caregiver contacted prior to discharge? N/a  Care Conference needed? N/a  Barriers to discharge: n/a    "

## 2024-05-25 NOTE — Plan of Care (Signed)
"    Problem: Chronic Conditions and Co-morbidities  Goal: Patient's chronic conditions and co-morbidity symptoms are monitored and maintained or improved  Outcome: Progressing     Problem: Discharge Planning  Goal: Discharge to home or other facility with appropriate resources  Outcome: Progressing     Problem: Pain  Goal: Verbalizes/displays adequate comfort level or baseline comfort level  Outcome: Progressing     Problem: Safety - Adult  Goal: Free from fall injury  Outcome: Progressing     Problem: Chronic Conditions and Co-morbidities  Goal: Patient's chronic conditions and co-morbidity symptoms are monitored and maintained or improved  Outcome: Progressing     Problem: Discharge Planning  Goal: Discharge to home or other facility with appropriate resources  Outcome: Progressing     Problem: Pain  Goal: Verbalizes/displays adequate comfort level or baseline comfort level  Outcome: Progressing     Problem: Safety - Adult  Goal: Free from fall injury  Outcome: Progressing     "

## 2024-05-25 NOTE — Plan of Care (Signed)
"    Problem: Chronic Conditions and Co-morbidities  Goal: Patient's chronic conditions and co-morbidity symptoms are monitored and maintained or improved  05/25/2024 0338 by Veronda Annabella BIRCH, RN  Outcome: Progressing  05/25/2024 0337 by Veronda Annabella BIRCH, RN  Outcome: Progressing  05/25/2024 0335 by Veronda Annabella BIRCH, RN  Outcome: Progressing  05/25/2024 0334 by Veronda Annabella BIRCH, RN  Outcome: Progressing     Problem: Discharge Planning  Goal: Discharge to home or other facility with appropriate resources  05/25/2024 0338 by Veronda Annabella BIRCH, RN  Outcome: Progressing  05/25/2024 0337 by Veronda Annabella BIRCH, RN  Outcome: Progressing  05/25/2024 0335 by Veronda Annabella BIRCH, RN  Outcome: Progressing  05/25/2024 0334 by Veronda Annabella D, RN  Outcome: Progressing     Problem: Pain  Goal: Verbalizes/displays adequate comfort level or baseline comfort level  05/25/2024 0338 by Veronda Annabella BIRCH, RN  Outcome: Progressing  05/25/2024 0337 by Veronda Annabella BIRCH, RN  Outcome: Progressing  05/25/2024 0335 by Veronda Annabella BIRCH, RN  Outcome: Progressing  05/25/2024 0334 by Veronda Annabella BIRCH, RN  Outcome: Progressing     Problem: Safety - Adult  Goal: Free from fall injury  05/25/2024 0338 by Veronda Annabella BIRCH, RN  Outcome: Progressing  05/25/2024 0337 by Veronda Annabella BIRCH, RN  Outcome: Progressing  05/25/2024 0335 by Veronda Annabella BIRCH, RN  Outcome: Progressing  05/25/2024 0334 by Veronda Annabella BIRCH, RN  Outcome: Progressing     "

## 2024-05-25 NOTE — Plan of Care (Signed)
"    Problem: Chronic Conditions and Co-morbidities  Goal: Patient's chronic conditions and co-morbidity symptoms are monitored and maintained or improved  05/25/2024 0337 by Veronda Annabella BIRCH, RN  Outcome: Progressing  05/25/2024 0335 by Veronda Annabella BIRCH, RN  Outcome: Progressing  05/25/2024 0334 by Veronda Annabella BIRCH, RN  Outcome: Progressing     Problem: Discharge Planning  Goal: Discharge to home or other facility with appropriate resources  05/25/2024 0337 by Veronda Annabella BIRCH, RN  Outcome: Progressing  05/25/2024 0335 by Veronda Annabella BIRCH, RN  Outcome: Progressing  05/25/2024 0334 by Veronda Annabella D, RN  Outcome: Progressing     Problem: Pain  Goal: Verbalizes/displays adequate comfort level or baseline comfort level  05/25/2024 0337 by Veronda Annabella BIRCH, RN  Outcome: Progressing  05/25/2024 0335 by Veronda Annabella BIRCH, RN  Outcome: Progressing  05/25/2024 0334 by Veronda Annabella BIRCH, RN  Outcome: Progressing     Problem: Safety - Adult  Goal: Free from fall injury  05/25/2024 0337 by Veronda Annabella BIRCH, RN  Outcome: Progressing  05/25/2024 0335 by Veronda Annabella BIRCH, RN  Outcome: Progressing  05/25/2024 0334 by Veronda Annabella BIRCH, RN  Outcome: Progressing     "

## 2024-05-26 LAB — COMPREHENSIVE METABOLIC PANEL
ALT: 22 U/L (ref 10–35)
AST: 22 U/L (ref 10–35)
Albumin/Globulin Ratio: 0.9 — ABNORMAL LOW (ref 1.1–2.2)
Albumin: 2.8 g/dL — ABNORMAL LOW (ref 3.5–5.2)
Alk Phosphatase: 61 U/L (ref 35–104)
Anion Gap: 9 mmol/L (ref 2–14)
BUN/Creatinine Ratio: 26 — ABNORMAL HIGH (ref 12–20)
BUN: 15 mg/dL (ref 8–23)
CO2: 25 mmol/L (ref 20–29)
Calcium: 8.7 mg/dL — ABNORMAL LOW (ref 8.8–10.2)
Chloride: 102 mmol/L (ref 98–107)
Creatinine: 0.57 mg/dL — ABNORMAL LOW (ref 0.60–1.00)
Est, Glom Filt Rate: >90 ml/min/1.73m2 (ref >59)
Globulin: 3 g/dL (ref 2.0–4.0)
Glucose: 166 mg/dL — ABNORMAL HIGH (ref 65–100)
Potassium: 3.8 mmol/L (ref 3.5–5.1)
Sodium: 136 mmol/L (ref 136–145)
Total Bilirubin: <0.2 mg/dL (ref 0.0–1.2)
Total Protein: 5.8 g/dL — ABNORMAL LOW (ref 6.4–8.3)

## 2024-05-26 LAB — CBC
Hematocrit: 37.5 % (ref 35.0–47.0)
Hemoglobin: 12.4 g/dL (ref 11.5–16.0)
MCH: 30 pg (ref 26.0–34.0)
MCHC: 33.1 g/dL (ref 30.0–36.5)
MCV: 90.6 FL (ref 80.0–99.0)
MPV: 9.2 FL (ref 8.9–12.9)
Nucleated RBCs: 0 /100{WBCs}
Platelets: 203 K/uL (ref 150–400)
RBC: 4.14 M/uL (ref 3.80–5.20)
RDW: 12.5 % (ref 11.5–14.5)
WBC: 5.9 K/uL (ref 3.6–11.0)
nRBC: 0 K/uL (ref 0.00–0.01)

## 2024-05-26 LAB — POCT GLUCOSE
POC Glucose: 298 mg/dL — ABNORMAL HIGH (ref 65–100)
POC Glucose: 175 mg/dL — ABNORMAL HIGH (ref 65–100)
POC Glucose: 263 mg/dL — ABNORMAL HIGH (ref 65–100)
POC Glucose: 307 mg/dL — ABNORMAL HIGH (ref 65–100)

## 2024-05-26 MED ORDER — OXYCODONE-ACETAMINOPHEN 5-325 MG PO TABS
5-325 | ORAL_TABLET | Freq: Three times a day (TID) | ORAL | 0 refills | 7.00000 days | Status: AC | PRN
Start: 2024-05-26 — End: 2024-05-28

## 2024-05-26 MED FILL — LOSARTAN POTASSIUM 50 MG PO TABS: 50 mg | ORAL | Qty: 1 | Fill #0

## 2024-05-26 MED FILL — LEVOTHYROXINE SODIUM 25 MCG PO TABS: 25 ug | ORAL | Qty: 1 | Fill #0

## 2024-05-26 MED FILL — INSULIN LISPRO 100 UNIT/ML IJ SOLN: 100 [IU]/mL | INTRAMUSCULAR | Qty: 4 | Fill #0

## 2024-05-26 MED FILL — OXYCODONE-ACETAMINOPHEN 5-325 MG PO TABS: 5-325 mg | ORAL | Qty: 1 | Fill #0

## 2024-05-26 MED FILL — ATORVASTATIN CALCIUM 40 MG PO TABS: 40 mg | ORAL | Qty: 1 | Fill #0

## 2024-05-26 MED FILL — INSULIN LISPRO 100 UNIT/ML IJ SOLN: 100 [IU]/mL | INTRAMUSCULAR | Qty: 6 | Fill #0

## 2024-05-26 MED FILL — ENOXAPARIN SODIUM 40 MG/0.4ML IJ SOSY: 40 MG/0.4ML | INTRAMUSCULAR | Qty: 0.4 | Fill #0

## 2024-05-26 MED FILL — CARVEDILOL 3.125 MG PO TABS: 3.125 mg | ORAL | Qty: 1 | Fill #0

## 2024-05-26 MED FILL — ASPIRIN LOW DOSE 81 MG PO TBEC: 81 mg | ORAL | Qty: 1 | Fill #0

## 2024-05-26 MED FILL — LANTUS 100 UNIT/ML SC SOLN: 100 [IU]/mL | SUBCUTANEOUS | Qty: 35 | Fill #0

## 2024-05-26 NOTE — Discharge Summary (Signed)
 "                    Hospitalist Discharge Summary      Name: Katrina Bowen  [id number]  Date of Birth: August 18, 1960 (Age: 63 y.o.)   Date of Admission: 05/22/2024  Date of Discharge: 05/26/2024  Attending Physician: Ronette Leyland, MD    Discharge Diagnoses:   Principal Problem:    Chest pain  Active Problems:    Uncontrolled type 2 diabetes mellitus with hyperglycemia (HCC)  Resolved Problems:    * No resolved hospital problems. *  Chest pain  Type II MI  CAD s/p PCI  HTN uncontrolled hypothyroidism  Uncontrolled type II DM  Diabetic neuropathy  Arthritis  Fibromyalgia  LLQ abdominal pain  Left flank pain  History of kidney stones  Medication noncompliance    Consultations:  IP CONSULT TO SOCIAL WORK  IP CONSULT TO SPIRITUAL SERVICES  IP CONSULT TO CARDIOLOGY  IP CONSULT TO CASE MANAGEMENT      Brief Admission History/Reason for Admission Per Katrina Rump, MD:   Chest pain    Brief Hospital Course:    Katrina Bowen is a 63 year old female with PMHx of hypothyroidism, DM type II, hypertension, CAD s/p PCI, HFpEF, diabetic neuropathy, arthritis, fibromyalgia, overall medication noncompliance who was admitted on 11/22 for chest pain.  Patient noted to have troponin elevation with a peak at 19.9.  Echo with normal ventricular systolic function and an EF of 65 to 70%, findings consistent with severe concentric hypertrophy and abnormal diastolic dysfunction.  Cardiology consulted, elevated troponin is likely secondary to demand ischemia but given patient's inability to obtain transportation and overall medication noncompliance, recommended inpatient stress test.  Patient noted to be hyperglycemic and states that she has run out of her insulin  at home, restarted on home dose of 35 units of Lantus .  A1c 10.1.  Patient has history of hypothyroidism, has not been taking levothyroxine .  TSH elevated with decreased T4, restarted on levothyroxine  125 mcg.  Patient noted to have chronic pain secondary to  fibromyalgia/arthritis and diabetic neuropathy and has been placed on morphine  but is requested IV Dilaudid .  Previously has followed with the pain management service, will provide referral at discharge.  Additionally, patient with ongoing left flank and left lower quadrant abdominal pain over past several weeks that has worsened recently with significant history of kidney stones.  Given duration of symptoms and difficulty with follow-up, CT A/P obtained which showed no acute intraperitoneal process but did incidentally show punctate nonobstructive right renal calculus and trace bilateral pleural effusions with trace pericardial effusion.  UA without evidence of UTI.     Cardiology has cleared patient for discharge.  Advised patient to follow-up with PCP in 10 to 14 days with recent admission for continued monitoring and management of medications and other comorbidities.  PCP referral provided.  Specialist follow up instructions/appointments as below.  Pain management referral provided.  Discussed discharge plan with patient, which patient agreed to.  All questions answered and patient will be discharged home pending delivery of medications from [facility] today, discharge delayed yesterday as not all medications were available from [facility] until today.    Discharge Exam:   Patient seen and examined by me on discharge day.  No new complaints.  Ambulating around room independently.    Gen:    Not in distress  Chest: Clear lungs  CVS:   Regular rhythm.  No edema.  Abd:  Soft, not distended, not tender.  Neuro: At baseline    Vitals:  Patient Vitals for the past 24 hrs:   BP Temp Temp src Pulse Resp SpO2 Weight   05/26/24 0908 -- -- -- -- 18 -- --   05/26/24 0836 133/75 -- Oral 76 18 94 % --   05/26/24 0332 -- -- -- -- 18 -- --   05/26/24 0248 123/75 97.5 F (36.4 C) Oral 78 18 96 % 81.1 kg (178 lb 12.7 oz)   05/25/24 2315 (!) 101/59 97.5 F (36.4 C) Oral 82 18 96 % --   05/25/24 2054 -- -- -- -- 18 --  --   05/25/24 2011 116/84 98.4 F (36.9 C) Oral 84 18 95 % --   05/25/24 1651 124/76 97.7 F (36.5 C) Oral 83 18 98 % --   05/25/24 1330 -- -- -- -- 16 -- --   05/25/24 1145 110/82 -- Oral 83 18 100 % --       Recent Laboratory Results:  Recent Labs     05/26/24  0623   NA 136   K 3.8   CL 102   CO2 25   BUN 15   CREATININE 0.57*   GLUCOSE 166*   CALCIUM  8.7*     Recent Labs     05/26/24  0623   HGB 12.4   HCT 37.5   WBC 5.9   PLT 203         Discharge Medications:        Medication List        START taking these medications      hydrOXYzine  pamoate 25 MG capsule  Commonly known as: VISTARIL   Take 1 capsule by mouth 3 times daily as needed for Anxiety     oxyCODONE -acetaminophen  5-325 MG per tablet  Commonly known as: PERCOCET   Take 1 tablet by mouth every 8 hours as needed for Pain for up to 2 days. Max Daily Amount: 3 tablets            CONTINUE taking these medications      acetaminophen  500 MG tablet  Commonly known as: TYLENOL   Take 2 tablets by mouth every 6 hours as needed for Pain     aspirin  81 MG EC tablet  Take 1 tablet by mouth daily     atorvastatin  40 MG tablet  Commonly known as: LIPITOR   Take 1 tablet by mouth nightly     Blood Glucose Monitor System w/Device Kit  Pharmacist to identify preferred meter and strips.     blood glucose test strips  Test 2-3 times a day & as needed for symptoms of irregular blood glucose. Dispense sufficient amount for indicated testing frequency plus additional to accommodate PRN testing needs. Pharmacist to identify preferred brand.     carvedilol  3.125 MG tablet  Commonly known as: COREG   Take 1 tablet by mouth 2 times daily (with meals)     empagliflozin  25 MG tablet  Commonly known as: Jardiance   Take 1 tablet by mouth daily     insulin  lispro (1 Unit Dial ) 100 UNIT/ML Sopn  Commonly known as: HumaLOG  KwikPen  Inject 10 Units into the skin 3 times daily (before meals)     Insulin  Pen Needle 32G X 4 MM Misc  1 each by Does not apply route daily     Lancets 30G  Misc  Test 2-3 times a day & as needed for symptoms of irregular blood glucose. Dispense sufficient amount for indicated testing frequency plus additional  to accommodate PRN testing needs. Pharmacist to identify preferred brand.     Lantus  SoloStar 100 UNIT/ML injection pen  Generic drug: insulin  glargine  Inject 35 Units into the skin daily     levothyroxine  125 MCG tablet  Commonly known as: SYNTHROID   Take 1 tablet by mouth every morning (before breakfast)     linaCLOtide  72 MCG Caps capsule  Commonly known as: LINZESS   Take 1 capsule by mouth every morning (before breakfast)     losartan  50 MG tablet  Commonly known as: COZAAR   Take 1 tablet by mouth daily            STOP taking these medications      clopidogrel  75 MG tablet  Commonly known as: PLAVIX      furosemide  40 MG tablet  Commonly known as: LASIX      ketorolac  10 MG tablet  Commonly known as: TORADOL      ondansetron  4 MG disintegrating tablet  Commonly known as: ZOFRAN -ODT            ASK your doctor about these medications      albuterol  sulfate HFA 108 (90 Base) MCG/ACT inhaler  Commonly known as: Proventil  HFA  Inhale 2 puffs into the lungs every 4 hours as needed for Wheezing     diclofenac sodium 1 % Gel  Commonly known as: VOLTAREN     sertraline  50 MG tablet  Commonly known as: ZOLOFT   Take 1 tablet by mouth daily               Where to Get Your Medications        These medications were sent to [facility] - [place], [place] - [address] - P [phone] [address] [phone]  [address] [place], [place] [address]      Phone: [phone]   aspirin  81 MG EC tablet  atorvastatin  40 MG tablet  blood glucose test strips  carvedilol  3.125 MG tablet  empagliflozin  25 MG tablet  hydrOXYzine  pamoate 25 MG capsule  Insulin  Pen Needle 32G X 4 MM Misc  Lantus  SoloStar 100 UNIT/ML injection pen  levothyroxine  125 MCG tablet  losartan  50 MG tablet  oxyCODONE -acetaminophen  5-325 MG per tablet           Disposition:     Home with Family:     x   Home with HH/PT/OT/RN:    SNF/LTC:    SAHR:    Other:      Discharge Recommendations:   Code Status: Full  Recommended Diet: cardiac diet and diabetic diet  Recommended Activity: activity as tolerated  Wound Care: None  Discharge instructions specific to the patient's condition have been attached for AVS.    Follow-Ups:   PCP : No, Pcp    Mohiuddin, Karlene, MD  [address]  [place] [address]  [phone]    Follow up on 06/07/2024  @1 :15pm, New Patient Appointment    Patient needs to arrive at 1:00pm with photo ID, insurance information, and current medications.    Darra Garnette SQUIBB, MD  [address]  [place] [address]  [phone]      Doctor's Office will contact patient with appointment details.    Maree President, APRN - NP  [address]  [place] [address]  [phone]    Follow up on 08/05/2024  @11 :00am, New Patient Appointment.    Patient needs to arrive at 10:30am with photo  ID and insurance information      Total time in minutes spent coordinating this discharge (includes going over instructions, follow-up, prescriptions, and preparing report for sign off to PCP) :  35 minutes    SIGNED: Lum DELENA Neth, PA-C  "

## 2024-05-26 NOTE — Progress Notes (Signed)
"  4 Eyes Skin Assessment     NAME:  Katrina Bowen  DATE OF BIRTH:  Nov 03, 1960  MEDICAL RECORD NUMBER:  [id number]    The patient is being assessed for  Other Per Unit protocol    I agree that at least one RN has performed a thorough Head to Toe Skin Assessment on the patient. ALL assessment sites listed below have been assessed.      Areas assessed by both nurses:    Head, Face, Ears, Shoulders, Back, Chest, Arms, Elbows, Hands, Sacrum. Buttock, Coccyx, Ischium, Legs. Feet and Heels, and Under Medical Devices         Does the Patient have a Wound? No noted wound(s)       Braden Prevention initiated by RN: Yes  Wound Care Orders initiated by RN: No    For hospital-acquired stage 1 & 2 and ALL Stage 3,4, Unstageable, DTI, NWPT, and Complex wounds: place order IP Wound Care/Ostomy Nurse Eval and Treat by RN under ORDER ENTRY: No    New Ostomies, if present place, Ostomy referral order under ORDER ENTRY: No     Nurse 1 eSignature: Electronically signed by Gaylan Nose, RN on 05/26/24 at 2:36 AM EST    **SHARE this note so that the co-signing nurse can place an eSignature**    Nurse 2 eSignature: Electronically signed by Annabella JONETTA Cagey, RN on 05/26/24 at 5:46 AM EST    "

## 2024-05-26 NOTE — Progress Notes (Signed)
"  Patient for discharge, printed discharge papers and discussed each with the patient. Patient's IV and telemetry box removed. Patient changed to regular clothes, ambulates independently. Cab booked by hospital staff for patient's ride home.    [facility] wheeled patient down by patient tech. Patient went home took her bedside belongings, discharge papers.  "

## 2024-05-26 NOTE — Plan of Care (Signed)
"    Problem: Chronic Conditions and Co-morbidities  Goal: Patient's chronic conditions and co-morbidity symptoms are monitored and maintained or improved  Outcome: Progressing     Problem: Pain  Goal: Verbalizes/displays adequate comfort level or baseline comfort level  Outcome: Progressing     Problem: Discharge Planning  Goal: Discharge to home or other facility with appropriate resources  Outcome: Progressing     Problem: Safety - Adult  Goal: Free from fall injury  Outcome: Progressing     "

## 2024-05-26 NOTE — Care Coordination (Signed)
"  Patient noted to have a discharge order again today. Patient lives in a motel with her mom and grandson. Yesterday patient could not leave as her pharmacy was not going to have her medications available until today.     Patient will discharge this morning.     Transition of Care Plan:    RUR: obs  Prior Level of Functioning: independent  Disposition: motel  EDD: today  If SNF or IPR: Date Freedom of Choice offered: n/a  Date Freedom of Choice received: n/a  Accepting facility: n/a  Date authorization started with reference number: n/a  Date authorization received and expires: n/a  Follow up appointments: yes  DME needed: n/a  Transportation at discharge: cab  Ambulance transport?n/a  Pt and family notified of potential financial responsibility for transport?          Who?  IM/IMM Medicare/Tricare letter given: n/a  Is patient a Veteran and connected with VA? N/a   If yes, was [employer] transfer form completed and VA notified? N/a  Caregiver Contact: n/a  Discharge Caregiver contacted prior to discharge? N/a  Care Conference needed? N/a  Barriers to discharge: n/a    "
